# Patient Record
Sex: Female | Born: 1945 | Race: White | Hispanic: No | Smoking: Former smoker
Health system: Southern US, Community
[De-identification: ages and names within clinical notes are randomized; demographics above are authoritative.]

## PROBLEM LIST (undated history)

## (undated) DIAGNOSIS — Z800 Family history of malignant neoplasm of digestive organs: Secondary | ICD-10-CM

## (undated) DIAGNOSIS — K760 Fatty (change of) liver, not elsewhere classified: Secondary | ICD-10-CM

## (undated) DIAGNOSIS — C4490 Unspecified malignant neoplasm of skin, unspecified: Secondary | ICD-10-CM

## (undated) DIAGNOSIS — K219 Gastro-esophageal reflux disease without esophagitis: Secondary | ICD-10-CM

## (undated) DIAGNOSIS — M1990 Unspecified osteoarthritis, unspecified site: Secondary | ICD-10-CM

## (undated) DIAGNOSIS — Z803 Family history of malignant neoplasm of breast: Secondary | ICD-10-CM

## (undated) DIAGNOSIS — E785 Hyperlipidemia, unspecified: Secondary | ICD-10-CM

## (undated) DIAGNOSIS — C50919 Malignant neoplasm of unspecified site of unspecified female breast: Secondary | ICD-10-CM

## (undated) DIAGNOSIS — F419 Anxiety disorder, unspecified: Secondary | ICD-10-CM

## (undated) DIAGNOSIS — M810 Age-related osteoporosis without current pathological fracture: Secondary | ICD-10-CM

## (undated) DIAGNOSIS — E039 Hypothyroidism, unspecified: Secondary | ICD-10-CM

## (undated) DIAGNOSIS — G4752 REM sleep behavior disorder: Secondary | ICD-10-CM

## (undated) DIAGNOSIS — M8580 Other specified disorders of bone density and structure, unspecified site: Secondary | ICD-10-CM

## (undated) DIAGNOSIS — H269 Unspecified cataract: Secondary | ICD-10-CM

## (undated) DIAGNOSIS — IMO0001 Reserved for inherently not codable concepts without codable children: Secondary | ICD-10-CM

## (undated) DIAGNOSIS — K5790 Diverticulosis of intestine, part unspecified, without perforation or abscess without bleeding: Secondary | ICD-10-CM

## (undated) DIAGNOSIS — M797 Fibromyalgia: Secondary | ICD-10-CM

## (undated) DIAGNOSIS — T7840XA Allergy, unspecified, initial encounter: Secondary | ICD-10-CM

## (undated) DIAGNOSIS — Z85828 Personal history of other malignant neoplasm of skin: Secondary | ICD-10-CM

## (undated) DIAGNOSIS — J449 Chronic obstructive pulmonary disease, unspecified: Secondary | ICD-10-CM

## (undated) DIAGNOSIS — J4489 Other specified chronic obstructive pulmonary disease: Secondary | ICD-10-CM

## (undated) DIAGNOSIS — I2510 Atherosclerotic heart disease of native coronary artery without angina pectoris: Secondary | ICD-10-CM

## (undated) HISTORY — DX: Hypothyroidism, unspecified: E03.9

## (undated) HISTORY — DX: Fibromyalgia: M79.7

## (undated) HISTORY — DX: Personal history of other malignant neoplasm of skin: Z85.828

## (undated) HISTORY — DX: Malignant neoplasm of unspecified site of unspecified female breast: C50.919

## (undated) HISTORY — DX: Gastro-esophageal reflux disease without esophagitis: K21.9

## (undated) HISTORY — DX: REM sleep behavior disorder: G47.52

## (undated) HISTORY — DX: Other specified disorders of bone density and structure, unspecified site: M85.80

## (undated) HISTORY — DX: Other specified chronic obstructive pulmonary disease: J44.89

## (undated) HISTORY — DX: Atherosclerotic heart disease of native coronary artery without angina pectoris: I25.10

## (undated) HISTORY — DX: Unspecified osteoarthritis, unspecified site: M19.90

## (undated) HISTORY — DX: Unspecified malignant neoplasm of skin, unspecified: C44.90

## (undated) HISTORY — DX: Family history of malignant neoplasm of digestive organs: Z80.0

## (undated) HISTORY — PX: COLONOSCOPY: SHX174

## (undated) HISTORY — DX: Allergy, unspecified, initial encounter: T78.40XA

## (undated) HISTORY — DX: Reserved for inherently not codable concepts without codable children: IMO0001

## (undated) HISTORY — DX: Anxiety disorder, unspecified: F41.9

## (undated) HISTORY — DX: Unspecified cataract: H26.9

## (undated) HISTORY — DX: Fatty (change of) liver, not elsewhere classified: K76.0

## (undated) HISTORY — DX: Hyperlipidemia, unspecified: E78.5

## (undated) HISTORY — DX: Diverticulosis of intestine, part unspecified, without perforation or abscess without bleeding: K57.90

## (undated) HISTORY — DX: Family history of malignant neoplasm of breast: Z80.3

## (undated) HISTORY — DX: Chronic obstructive pulmonary disease, unspecified: J44.9

## (undated) HISTORY — DX: Age-related osteoporosis without current pathological fracture: M81.0

## (undated) HISTORY — PX: BREAST LUMPECTOMY: SHX2

---

## 1997-08-14 ENCOUNTER — Ambulatory Visit (HOSPITAL_COMMUNITY): Admission: RE | Admit: 1997-08-14 | Discharge: 1997-08-14 | Payer: Self-pay | Admitting: Hematology and Oncology

## 1998-10-08 ENCOUNTER — Other Ambulatory Visit: Admission: RE | Admit: 1998-10-08 | Discharge: 1998-10-08 | Payer: Self-pay | Admitting: Internal Medicine

## 1999-02-17 ENCOUNTER — Ambulatory Visit (HOSPITAL_COMMUNITY): Admission: RE | Admit: 1999-02-17 | Discharge: 1999-02-17 | Payer: Self-pay | Admitting: Gastroenterology

## 1999-11-16 ENCOUNTER — Encounter: Payer: Self-pay | Admitting: Internal Medicine

## 1999-11-16 ENCOUNTER — Ambulatory Visit (HOSPITAL_COMMUNITY): Admission: RE | Admit: 1999-11-16 | Discharge: 1999-11-16 | Payer: Self-pay | Admitting: Internal Medicine

## 2001-01-09 ENCOUNTER — Encounter: Payer: Self-pay | Admitting: Internal Medicine

## 2001-01-09 ENCOUNTER — Ambulatory Visit (HOSPITAL_COMMUNITY): Admission: RE | Admit: 2001-01-09 | Discharge: 2001-01-09 | Payer: Self-pay | Admitting: Internal Medicine

## 2002-01-17 ENCOUNTER — Ambulatory Visit (HOSPITAL_COMMUNITY): Admission: RE | Admit: 2002-01-17 | Discharge: 2002-01-17 | Payer: Self-pay | Admitting: Internal Medicine

## 2002-01-17 ENCOUNTER — Encounter: Payer: Self-pay | Admitting: Internal Medicine

## 2002-04-23 ENCOUNTER — Other Ambulatory Visit: Admission: RE | Admit: 2002-04-23 | Discharge: 2002-04-23 | Payer: Self-pay | Admitting: Internal Medicine

## 2003-02-05 ENCOUNTER — Encounter: Payer: Self-pay | Admitting: Internal Medicine

## 2003-02-05 ENCOUNTER — Ambulatory Visit (HOSPITAL_COMMUNITY): Admission: RE | Admit: 2003-02-05 | Discharge: 2003-02-05 | Payer: Self-pay | Admitting: Internal Medicine

## 2003-12-26 ENCOUNTER — Encounter: Admission: RE | Admit: 2003-12-26 | Discharge: 2003-12-26 | Payer: Self-pay | Admitting: Internal Medicine

## 2004-08-06 ENCOUNTER — Ambulatory Visit: Payer: Self-pay | Admitting: Internal Medicine

## 2004-08-20 ENCOUNTER — Ambulatory Visit: Payer: Self-pay | Admitting: Internal Medicine

## 2004-08-20 ENCOUNTER — Other Ambulatory Visit: Admission: RE | Admit: 2004-08-20 | Discharge: 2004-08-20 | Payer: Self-pay | Admitting: Internal Medicine

## 2005-01-12 ENCOUNTER — Encounter: Admission: RE | Admit: 2005-01-12 | Discharge: 2005-01-12 | Payer: Self-pay | Admitting: Internal Medicine

## 2005-01-14 ENCOUNTER — Ambulatory Visit: Payer: Self-pay | Admitting: Pulmonary Disease

## 2005-02-01 ENCOUNTER — Ambulatory Visit: Payer: Self-pay | Admitting: Pulmonary Disease

## 2005-02-01 ENCOUNTER — Ambulatory Visit: Admission: RE | Admit: 2005-02-01 | Discharge: 2005-02-01 | Payer: Self-pay | Admitting: Pulmonary Disease

## 2005-02-07 ENCOUNTER — Ambulatory Visit: Payer: Self-pay | Admitting: Pulmonary Disease

## 2005-02-08 ENCOUNTER — Ambulatory Visit: Payer: Self-pay | Admitting: Internal Medicine

## 2005-02-23 ENCOUNTER — Ambulatory Visit: Payer: Self-pay | Admitting: Pulmonary Disease

## 2005-02-23 ENCOUNTER — Ambulatory Visit: Payer: Self-pay | Admitting: Cardiology

## 2005-02-28 ENCOUNTER — Ambulatory Visit: Payer: Self-pay | Admitting: Internal Medicine

## 2005-03-11 ENCOUNTER — Ambulatory Visit: Payer: Self-pay | Admitting: Pulmonary Disease

## 2005-04-04 ENCOUNTER — Ambulatory Visit: Payer: Self-pay | Admitting: Pulmonary Disease

## 2005-07-11 HISTORY — PX: NASAL SINUS SURGERY: SHX719

## 2005-07-12 ENCOUNTER — Ambulatory Visit: Payer: Self-pay | Admitting: Internal Medicine

## 2005-08-02 ENCOUNTER — Encounter: Payer: Self-pay | Admitting: Emergency Medicine

## 2005-08-04 ENCOUNTER — Ambulatory Visit: Payer: Self-pay | Admitting: Emergency Medicine

## 2005-08-04 ENCOUNTER — Inpatient Hospital Stay (HOSPITAL_COMMUNITY): Admission: EM | Admit: 2005-08-04 | Discharge: 2005-08-05 | Payer: Self-pay | Admitting: Emergency Medicine

## 2005-08-12 ENCOUNTER — Ambulatory Visit: Payer: Self-pay | Admitting: Emergency Medicine

## 2005-11-11 ENCOUNTER — Ambulatory Visit: Payer: Self-pay | Admitting: Emergency Medicine

## 2006-01-23 ENCOUNTER — Encounter: Admission: RE | Admit: 2006-01-23 | Discharge: 2006-01-23 | Payer: Self-pay | Admitting: Internal Medicine

## 2006-01-23 ENCOUNTER — Ambulatory Visit: Payer: Self-pay | Admitting: Internal Medicine

## 2006-05-16 ENCOUNTER — Ambulatory Visit: Payer: Self-pay | Admitting: Critical Care Medicine

## 2006-08-16 ENCOUNTER — Ambulatory Visit: Payer: Self-pay | Admitting: Internal Medicine

## 2006-08-16 LAB — CONVERTED CEMR LAB
AST: 30 units/L (ref 0–37)
BUN: 23 mg/dL (ref 6–23)
Basophils Relative: 1.5 % — ABNORMAL HIGH (ref 0.0–1.0)
CO2: 33 meq/L — ABNORMAL HIGH (ref 19–32)
Calcium: 9.9 mg/dL (ref 8.4–10.5)
Chloride: 105 meq/L (ref 96–112)
Creatinine, Ser: 1 mg/dL (ref 0.4–1.2)
Direct LDL: 159.6 mg/dL
Eosinophils Relative: 5.9 % — ABNORMAL HIGH (ref 0.0–5.0)
GFR calc non Af Amer: 60 mL/min
Glucose, Bld: 77 mg/dL (ref 70–99)
Lymphocytes Relative: 29.6 % (ref 12.0–46.0)
MCHC: 34.3 g/dL (ref 30.0–36.0)
Monocytes Absolute: 0.5 10*3/uL (ref 0.2–0.7)
Monocytes Relative: 6.7 % (ref 3.0–11.0)
Platelets: 211 10*3/uL (ref 150–400)
RDW: 12.7 % (ref 11.5–14.6)
Sodium: 145 meq/L (ref 135–145)
TSH: 0.55 microintl units/mL (ref 0.35–5.50)
Total Bilirubin: 0.5 mg/dL (ref 0.3–1.2)
Total CHOL/HDL Ratio: 4.2
Total Protein: 7.3 g/dL (ref 6.0–8.3)
Triglycerides: 123 mg/dL (ref 0–149)
VLDL: 25 mg/dL (ref 0–40)

## 2006-08-23 ENCOUNTER — Ambulatory Visit: Payer: Self-pay | Admitting: Internal Medicine

## 2006-08-23 ENCOUNTER — Encounter: Payer: Self-pay | Admitting: Internal Medicine

## 2006-08-23 ENCOUNTER — Other Ambulatory Visit: Admission: RE | Admit: 2006-08-23 | Discharge: 2006-08-23 | Payer: Self-pay | Admitting: Internal Medicine

## 2006-08-23 LAB — CONVERTED CEMR LAB: Pap Smear: NORMAL

## 2006-12-08 ENCOUNTER — Ambulatory Visit: Payer: Self-pay | Admitting: Internal Medicine

## 2007-02-05 ENCOUNTER — Encounter: Admission: RE | Admit: 2007-02-05 | Discharge: 2007-02-05 | Payer: Self-pay | Admitting: Internal Medicine

## 2007-03-26 ENCOUNTER — Ambulatory Visit: Payer: Self-pay | Admitting: Internal Medicine

## 2007-03-26 DIAGNOSIS — M545 Low back pain: Secondary | ICD-10-CM

## 2007-03-26 DIAGNOSIS — Z853 Personal history of malignant neoplasm of breast: Secondary | ICD-10-CM | POA: Insufficient documentation

## 2007-03-26 DIAGNOSIS — M1990 Unspecified osteoarthritis, unspecified site: Secondary | ICD-10-CM | POA: Insufficient documentation

## 2007-03-26 DIAGNOSIS — E039 Hypothyroidism, unspecified: Secondary | ICD-10-CM

## 2007-03-26 DIAGNOSIS — E785 Hyperlipidemia, unspecified: Secondary | ICD-10-CM | POA: Insufficient documentation

## 2007-03-26 DIAGNOSIS — J449 Chronic obstructive pulmonary disease, unspecified: Secondary | ICD-10-CM | POA: Insufficient documentation

## 2007-03-26 DIAGNOSIS — E0590 Thyrotoxicosis, unspecified without thyrotoxic crisis or storm: Secondary | ICD-10-CM | POA: Insufficient documentation

## 2007-03-26 DIAGNOSIS — J4489 Other specified chronic obstructive pulmonary disease: Secondary | ICD-10-CM | POA: Insufficient documentation

## 2007-03-26 DIAGNOSIS — F411 Generalized anxiety disorder: Secondary | ICD-10-CM | POA: Insufficient documentation

## 2007-03-26 DIAGNOSIS — IMO0002 Reserved for concepts with insufficient information to code with codable children: Secondary | ICD-10-CM | POA: Insufficient documentation

## 2007-05-17 ENCOUNTER — Ambulatory Visit: Payer: Self-pay | Admitting: Internal Medicine

## 2007-05-17 LAB — CONVERTED CEMR LAB
BUN: 18 mg/dL (ref 6–23)
Creatinine, Ser: 0.9 mg/dL (ref 0.4–1.2)

## 2007-05-21 ENCOUNTER — Telehealth: Payer: Self-pay | Admitting: Internal Medicine

## 2007-06-18 ENCOUNTER — Telehealth: Payer: Self-pay | Admitting: Internal Medicine

## 2007-06-30 DIAGNOSIS — J329 Chronic sinusitis, unspecified: Secondary | ICD-10-CM | POA: Insufficient documentation

## 2007-06-30 DIAGNOSIS — J45909 Unspecified asthma, uncomplicated: Secondary | ICD-10-CM | POA: Insufficient documentation

## 2007-08-09 ENCOUNTER — Telehealth: Payer: Self-pay | Admitting: Internal Medicine

## 2007-08-20 ENCOUNTER — Ambulatory Visit: Payer: Self-pay | Admitting: Internal Medicine

## 2007-08-20 LAB — CONVERTED CEMR LAB
Specific Gravity, Urine: 1.02
Urobilinogen, UA: 0.2
WBC Urine, dipstick: NEGATIVE
pH: 5.5

## 2007-08-27 ENCOUNTER — Ambulatory Visit: Payer: Self-pay | Admitting: Internal Medicine

## 2007-08-27 DIAGNOSIS — E878 Other disorders of electrolyte and fluid balance, not elsewhere classified: Secondary | ICD-10-CM | POA: Insufficient documentation

## 2007-08-27 DIAGNOSIS — R740 Nonspecific elevation of levels of transaminase and lactic acid dehydrogenase [LDH]: Secondary | ICD-10-CM

## 2007-08-27 LAB — CONVERTED CEMR LAB
Albumin: 4.4 g/dL (ref 3.5–5.2)
Alkaline Phosphatase: 78 units/L (ref 39–117)
BUN: 15 mg/dL (ref 6–23)
Basophils Relative: 1.2 % — ABNORMAL HIGH (ref 0.0–1.0)
CO2: 32 meq/L (ref 19–32)
Chloride: 102 meq/L (ref 96–112)
Cholesterol: 226 mg/dL (ref 0–200)
Creatinine, Ser: 1.1 mg/dL (ref 0.4–1.2)
Creatinine, Ser: 0.9 mg/dL (ref 0.4–1.2)
Eosinophils Relative: 6.9 % — ABNORMAL HIGH (ref 0.0–5.0)
GFR calc Af Amer: 65 mL/min
GFR calc Af Amer: 82 mL/min
GFR calc non Af Amer: 53 mL/min
Glucose, Bld: 107 mg/dL — ABNORMAL HIGH (ref 70–99)
HDL: 56.3 mg/dL (ref >39.0)
MCHC: 32.9 g/dL (ref 30.0–36.0)
Monocytes Relative: 8.7 % (ref 3.0–11.0)
Neutro Abs: 2.3 10*3/uL (ref 1.4–7.7)
Platelets: 191 10*3/uL (ref 150–400)
Potassium: 5.8 meq/L — ABNORMAL HIGH (ref 3.5–5.1)
Potassium: 4.6 meq/L (ref 3.5–5.1)
RBC: 4.52 M/uL (ref 3.87–5.11)
Total Bilirubin: 0.6 mg/dL (ref 0.3–1.2)
Total Protein: 7.2 g/dL (ref 6.0–8.3)
Triglycerides: 125 mg/dL (ref 0–149)
VLDL: 25 mg/dL (ref 0–40)

## 2007-09-24 ENCOUNTER — Ambulatory Visit: Payer: Self-pay | Admitting: Internal Medicine

## 2007-09-26 ENCOUNTER — Ambulatory Visit: Payer: Self-pay | Admitting: Internal Medicine

## 2007-10-01 ENCOUNTER — Telehealth: Payer: Self-pay | Admitting: Internal Medicine

## 2008-02-01 ENCOUNTER — Ambulatory Visit: Payer: Self-pay | Admitting: Internal Medicine

## 2008-02-17 ENCOUNTER — Encounter: Payer: Self-pay | Admitting: Emergency Medicine

## 2008-02-18 ENCOUNTER — Encounter: Payer: Self-pay | Admitting: Emergency Medicine

## 2008-02-25 ENCOUNTER — Ambulatory Visit: Payer: Self-pay | Admitting: Internal Medicine

## 2008-02-27 LAB — CONVERTED CEMR LAB
AST: 28 units/L (ref 0–37)
Albumin: 4.1 g/dL (ref 3.5–5.2)
Alkaline Phosphatase: 77 units/L (ref 39–117)
Cholesterol: 236 mg/dL (ref 0–200)
HDL: 45.8 mg/dL (ref >39.0)
Total Protein: 6.7 g/dL (ref 6.0–8.3)
Triglycerides: 181 mg/dL — ABNORMAL HIGH (ref 0–149)

## 2008-03-06 ENCOUNTER — Encounter: Admission: RE | Admit: 2008-03-06 | Discharge: 2008-03-06 | Payer: Self-pay | Admitting: Internal Medicine

## 2008-05-31 ENCOUNTER — Ambulatory Visit: Payer: Self-pay | Admitting: Family Medicine

## 2008-06-11 ENCOUNTER — Ambulatory Visit: Payer: Self-pay | Admitting: Emergency Medicine

## 2008-06-16 ENCOUNTER — Telehealth: Payer: Self-pay | Admitting: Internal Medicine

## 2008-06-23 ENCOUNTER — Ambulatory Visit: Payer: Self-pay | Admitting: Internal Medicine

## 2008-06-23 DIAGNOSIS — IMO0002 Reserved for concepts with insufficient information to code with codable children: Secondary | ICD-10-CM | POA: Insufficient documentation

## 2008-07-01 ENCOUNTER — Telehealth: Payer: Self-pay | Admitting: *Deleted

## 2008-07-08 ENCOUNTER — Ambulatory Visit: Payer: Self-pay | Admitting: Internal Medicine

## 2008-07-08 LAB — CONVERTED CEMR LAB
BUN: 22 mg/dL (ref 6–23)
Calcium: 9.8 mg/dL (ref 8.4–10.5)
Creatinine, Ser: 0.9 mg/dL (ref 0.4–1.2)
GFR calc Af Amer: 81 mL/min
Glucose, Bld: 128 mg/dL — ABNORMAL HIGH (ref 70–99)
Sodium: 140 meq/L (ref 135–145)

## 2008-07-16 ENCOUNTER — Encounter: Admission: RE | Admit: 2008-07-16 | Discharge: 2008-07-16 | Payer: Self-pay | Admitting: Internal Medicine

## 2008-07-30 ENCOUNTER — Encounter: Payer: Self-pay | Admitting: Internal Medicine

## 2008-08-08 ENCOUNTER — Encounter: Payer: Self-pay | Admitting: Internal Medicine

## 2008-09-10 ENCOUNTER — Telehealth: Payer: Self-pay | Admitting: *Deleted

## 2008-10-01 ENCOUNTER — Telehealth: Payer: Self-pay | Admitting: Emergency Medicine

## 2008-10-06 ENCOUNTER — Telehealth: Payer: Self-pay | Admitting: Internal Medicine

## 2008-10-06 ENCOUNTER — Ambulatory Visit: Payer: Self-pay | Admitting: Internal Medicine

## 2008-10-13 ENCOUNTER — Ambulatory Visit: Payer: Self-pay | Admitting: Internal Medicine

## 2008-12-12 ENCOUNTER — Ambulatory Visit: Payer: Self-pay | Admitting: Emergency Medicine

## 2008-12-29 ENCOUNTER — Ambulatory Visit: Payer: Self-pay | Admitting: Internal Medicine

## 2008-12-29 LAB — CONVERTED CEMR LAB
Albumin: 4 g/dL (ref 3.5–5.2)
Alkaline Phosphatase: 79 units/L (ref 39–117)
Basophils Absolute: 0 10*3/uL (ref 0.0–0.1)
Bilirubin, Direct: 0.1 mg/dL (ref 0.0–0.3)
CO2: 31 meq/L (ref 19–32)
Eosinophils Absolute: 0.4 10*3/uL (ref 0.0–0.7)
GFR calc non Af Amer: 66.98 mL/min (ref >60)
Glucose, Bld: 91 mg/dL (ref 70–99)
HCT: 41.9 % (ref 36.0–46.0)
Hemoglobin: 14.5 g/dL (ref 12.0–15.0)
MCV: 93.5 fL (ref 78.0–100.0)
Monocytes Absolute: 0.4 10*3/uL (ref 0.1–1.0)
Neutro Abs: 2.4 10*3/uL (ref 1.4–7.7)
Neutrophils Relative %: 42.5 % — ABNORMAL LOW (ref 43.0–77.0)
Nitrite: NEGATIVE
Platelets: 182 10*3/uL (ref 150.0–400.0)
Potassium: 4.4 meq/L (ref 3.5–5.1)
RBC: 4.48 M/uL (ref 3.87–5.11)
RDW: 12.7 % (ref 11.5–14.6)
Sodium: 143 meq/L (ref 135–145)
Total Bilirubin: 0.8 mg/dL (ref 0.3–1.2)
Urobilinogen, UA: 0.2

## 2009-01-05 ENCOUNTER — Ambulatory Visit: Payer: Self-pay | Admitting: Internal Medicine

## 2009-01-05 DIAGNOSIS — T50995A Adverse effect of other drugs, medicaments and biological substances, initial encounter: Secondary | ICD-10-CM

## 2009-01-05 LAB — CONVERTED CEMR LAB: HDL goal, serum: 40 mg/dL

## 2009-01-06 ENCOUNTER — Encounter: Admission: RE | Admit: 2009-01-06 | Discharge: 2009-01-06 | Payer: Self-pay | Admitting: Internal Medicine

## 2009-02-02 ENCOUNTER — Encounter: Payer: Self-pay | Admitting: Internal Medicine

## 2009-02-09 ENCOUNTER — Ambulatory Visit: Payer: Self-pay | Admitting: Internal Medicine

## 2009-02-16 LAB — CONVERTED CEMR LAB
ALT: 21 units/L (ref 0–35)
AST: 22 units/L (ref 0–37)
Alkaline Phosphatase: 75 units/L (ref 39–117)
Total Bilirubin: 0.7 mg/dL (ref 0.3–1.2)

## 2009-02-20 ENCOUNTER — Telehealth: Payer: Self-pay | Admitting: *Deleted

## 2009-04-16 ENCOUNTER — Ambulatory Visit: Payer: Self-pay | Admitting: Internal Medicine

## 2009-04-16 DIAGNOSIS — Z87891 Personal history of nicotine dependence: Secondary | ICD-10-CM

## 2009-04-16 DIAGNOSIS — J029 Acute pharyngitis, unspecified: Secondary | ICD-10-CM

## 2009-04-16 LAB — CONVERTED CEMR LAB: Rapid Strep: NEGATIVE

## 2009-07-20 ENCOUNTER — Ambulatory Visit: Payer: Self-pay | Admitting: Internal Medicine

## 2009-07-24 LAB — CONVERTED CEMR LAB
AST: 28 units/L (ref 0–37)
Alkaline Phosphatase: 69 units/L (ref 39–117)
Total Protein: 7.1 g/dL (ref 6.0–8.3)

## 2009-08-25 LAB — HM DIABETES EYE EXAM: HM Diabetic Eye Exam: NORMAL

## 2009-09-10 ENCOUNTER — Ambulatory Visit: Payer: Self-pay | Admitting: Emergency Medicine

## 2009-09-28 ENCOUNTER — Telehealth (INDEPENDENT_AMBULATORY_CARE_PROVIDER_SITE_OTHER): Payer: Self-pay | Admitting: *Deleted

## 2009-10-20 ENCOUNTER — Ambulatory Visit: Payer: Self-pay | Admitting: Emergency Medicine

## 2009-10-20 ENCOUNTER — Encounter: Payer: Self-pay | Admitting: Emergency Medicine

## 2009-10-20 ENCOUNTER — Telehealth (INDEPENDENT_AMBULATORY_CARE_PROVIDER_SITE_OTHER): Payer: Self-pay | Admitting: *Deleted

## 2009-11-02 ENCOUNTER — Telehealth (INDEPENDENT_AMBULATORY_CARE_PROVIDER_SITE_OTHER): Payer: Self-pay | Admitting: *Deleted

## 2010-01-21 ENCOUNTER — Ambulatory Visit: Payer: Self-pay | Admitting: Internal Medicine

## 2010-01-21 DIAGNOSIS — M25569 Pain in unspecified knee: Secondary | ICD-10-CM

## 2010-01-28 ENCOUNTER — Telehealth: Payer: Self-pay | Admitting: Internal Medicine

## 2010-03-17 ENCOUNTER — Telehealth: Payer: Self-pay | Admitting: *Deleted

## 2010-04-09 ENCOUNTER — Encounter: Admission: RE | Admit: 2010-04-09 | Discharge: 2010-04-09 | Payer: Self-pay | Admitting: Internal Medicine

## 2010-04-14 ENCOUNTER — Encounter: Payer: Self-pay | Admitting: Internal Medicine

## 2010-04-22 ENCOUNTER — Encounter: Admission: RE | Admit: 2010-04-22 | Discharge: 2010-04-22 | Payer: Self-pay | Admitting: Internal Medicine

## 2010-04-29 ENCOUNTER — Telehealth (INDEPENDENT_AMBULATORY_CARE_PROVIDER_SITE_OTHER): Payer: Self-pay | Admitting: *Deleted

## 2010-04-30 ENCOUNTER — Ambulatory Visit: Payer: Self-pay | Admitting: Emergency Medicine

## 2010-05-03 ENCOUNTER — Telehealth (INDEPENDENT_AMBULATORY_CARE_PROVIDER_SITE_OTHER): Payer: Self-pay | Admitting: *Deleted

## 2010-05-03 ENCOUNTER — Encounter: Payer: Self-pay | Admitting: Emergency Medicine

## 2010-05-04 ENCOUNTER — Telehealth: Payer: Self-pay | Admitting: Emergency Medicine

## 2010-05-05 ENCOUNTER — Encounter: Payer: Self-pay | Admitting: Internal Medicine

## 2010-05-05 ENCOUNTER — Telehealth (INDEPENDENT_AMBULATORY_CARE_PROVIDER_SITE_OTHER): Payer: Self-pay | Admitting: *Deleted

## 2010-05-05 ENCOUNTER — Other Ambulatory Visit: Admission: RE | Admit: 2010-05-05 | Discharge: 2010-05-05 | Payer: Self-pay | Admitting: Internal Medicine

## 2010-05-05 ENCOUNTER — Encounter: Payer: Self-pay | Admitting: Emergency Medicine

## 2010-05-05 ENCOUNTER — Ambulatory Visit: Payer: Self-pay | Admitting: Internal Medicine

## 2010-05-05 DIAGNOSIS — M899 Disorder of bone, unspecified: Secondary | ICD-10-CM | POA: Insufficient documentation

## 2010-05-05 DIAGNOSIS — M949 Disorder of cartilage, unspecified: Secondary | ICD-10-CM

## 2010-05-05 DIAGNOSIS — F4321 Adjustment disorder with depressed mood: Secondary | ICD-10-CM

## 2010-05-07 LAB — CONVERTED CEMR LAB: Pap Smear: NEGATIVE

## 2010-05-25 ENCOUNTER — Ambulatory Visit: Payer: Self-pay | Admitting: Internal Medicine

## 2010-06-08 ENCOUNTER — Ambulatory Visit: Payer: Self-pay | Admitting: Internal Medicine

## 2010-06-11 ENCOUNTER — Ambulatory Visit: Payer: Self-pay | Admitting: Emergency Medicine

## 2010-07-11 HISTORY — PX: KNEE SURGERY: SHX244

## 2010-07-23 ENCOUNTER — Telehealth (INDEPENDENT_AMBULATORY_CARE_PROVIDER_SITE_OTHER): Payer: Self-pay | Admitting: *Deleted

## 2010-08-01 ENCOUNTER — Encounter: Payer: Self-pay | Admitting: Internal Medicine

## 2010-08-05 ENCOUNTER — Encounter: Payer: Self-pay | Admitting: Emergency Medicine

## 2010-08-08 LAB — CONVERTED CEMR LAB
BUN: 22 mg/dL (ref 6–23)
Basophils Absolute: 0 10*3/uL (ref 0.0–0.1)
Basophils Relative: 0.7 % (ref 0.0–3.0)
Bilirubin, Direct: 0.1 mg/dL (ref 0.0–0.3)
CO2: 30 meq/L (ref 19–32)
Cholesterol: 211 mg/dL — ABNORMAL HIGH (ref 0–200)
Creatinine, Ser: 1 mg/dL (ref 0.4–1.2)
Direct LDL: 139.7 mg/dL
GFR calc non Af Amer: 59.05 mL/min (ref >60)
Glucose, Bld: 98 mg/dL (ref 70–99)
HCT: 42 % (ref 36.0–46.0)
Lymphocytes Relative: 44 % (ref 12.0–46.0)
Lymphs Abs: 2.3 10*3/uL (ref 0.7–4.0)
Monocytes Relative: 7.3 % (ref 3.0–12.0)
Neutro Abs: 2.2 10*3/uL (ref 1.4–7.7)
Neutrophils Relative %: 41.5 % — ABNORMAL LOW (ref 43.0–77.0)
Potassium: 5.2 meq/L — ABNORMAL HIGH (ref 3.5–5.1)
RBC: 4.45 M/uL (ref 3.87–5.11)
TSH: 0.69 microintl units/mL (ref 0.35–5.50)
Total CHOL/HDL Ratio: 4
Total Protein: 6.8 g/dL (ref 6.0–8.3)
Triglycerides: 99 mg/dL (ref 0.0–149.0)
WBC: 5.2 10*3/uL (ref 4.5–10.5)

## 2010-08-09 ENCOUNTER — Telehealth: Payer: Self-pay | Admitting: Emergency Medicine

## 2010-08-10 NOTE — Assessment & Plan Note (Signed)
Summary: KNEE PAIN // RS   Vital Signs:  Patient profile:   65 year old female Menstrual status:  postmenopausal Weight:      186 pounds Pulse rate:   100 / minute BP sitting:   140 / 90  (right arm) Cuff size:   regular  Vitals Entered By: Romualdo Bolk, CMA (AAMA) (January 21, 2010 9:38 AM) CC: Left knee pain x 1 month it hurts to go down stairs. There is a little swollen part on the inside of knee. No injury that she knows of.   History of Present Illness: Jasmine Neal comes in today  for sda for knee pain .  No injury but  began about a month ago see above ...was on vacation and on the way home began hurting in the car.   But no injury  hours.  no click or instability . hurts more with descending stairs than going up stars. No hx of same . Pain is medial knee and feels swollne there. NO fever or redness . Back djd is stable.   Tried tylenol for pain and ice .  no other rx ? about exercising    Preventive Screening-Counseling & Management  Alcohol-Tobacco     Alcohol drinks/day: 1     Alcohol type: red wine     Smoking Status: quit 1975     Packs/Day: 1ppd     Year Quit: 1975     Pack years: 10  Caffeine-Diet-Exercise     Caffeine use/day: <1 cup     Does Patient Exercise: yes     Type of exercise: yoga     Exercise (avg: min/session): <30     Times/week: 3  Current Medications (verified): 1)  Levoxyl 88 Mcg  Tabs (Levothyroxine Sodium) .Marland Kitchen.. 1 By Mouth Once Daily 2)  Paroxetine Hcl 40 Mg Tabs (Paroxetine Hcl) .... Take 1 Tablet By Mouth At Bedtime 3)  Tylenol Pm Extra Strength 500-25 Mg  Tabs (Diphenhydramine-Apap (Sleep)) .... As Needed 4)  Ventolin Hfa 108 (90 Base) Mcg/act Aers (Albuterol Sulfate) .... Inhale 2 Puffs Every 4 Hours As Needed (Emergency Only) 5)  Symbicort 160-4.5 Mcg/act Aero (Budesonide-Formoterol Fumarate) .... 2 Puffs Two Times A Day 6)  Spiriva Handihaler 18 Mcg Caps (Tiotropium Bromide Monohydrate) .Marland Kitchen.. 1 Once Daily  Allergies  (verified): 1)  ! * Advair  Past History:  Past medical, surgical, family and social histories (including risk factors) reviewed for relevance to current acute and chronic problems.  Past Medical History: Reviewed history from 01/05/2009 and no changes required. Breast cancer, hx of right lumpectomy    1990 IIA radiation cmfp adjunct rx  Hx dysplasia  treated with cerv conization COPD  /asthma evaluated by pulmonary  severe by spirometry non smoker currently Osteoarthritis Hyperlipidemia Hypothyroidism Fibromyalgia  Hx abnormal gtt  and fatty liver   Past Surgical History: Reviewed history from 02/01/2008 and no changes required. Sinus surgery   Dr Annalee Genta  on Right. colonoscopy  Past History:  Care Management: Pain Management:will call with dr.  Erline Hau   Delton Coombes   Family History: Reviewed history from 06/23/2008 and no changes required. Family History Osteoporosis Family History Thyroid disease mother-breast CA father-deceased Mysthenia Gravis sisters x 3-breast CA Sis ter had back surgery from injury working she is 5 years younger than pt.  Social History: Reviewed history from 01/05/2009 and no changes required. 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 Quit smoking 30+ yrs ago. 20 yr pack year smoking hx. Alcohol use-yes Drug  use-no Regular exercise-no    hh of 2   2 cats    Review of Systems  The patient denies anorexia, fever, weight loss, abdominal pain, melena, hematochezia, severe indigestion/heartburn, and abnormal bleeding.    Physical Exam  General:  alert, well-developed, well-nourished, and well-hydrated.   Head:  normocephalic and atraumatic.   Msk:  no joint warmth, no redness over joints, no joint deformities, and no crepitation.   left knee with medial line tenderness and slight swelling there no warmth and full rom ? stable neg drawer.  Pulses:  nl cap refill Extremities:  se above no clubbing cyanosis or edema  Neurologic:  alert &  oriented X3.  grossly non focal  Skin:  turgor normal, color normal, and no ecchymoses.   Psych:  Oriented X3 and normally interactive.     Impression & Recommendations:  Problem # 1:  KNEE PAIN, LEFT (ICD-719.46) Assessment New  left knee with medial pain  ? OA medial  compartment  cartilage or other.   mcl less likely wih no injury .   REc trial of nsaids . may need x ray and further eval rx etc.    disc options  and plans  Discussed strengthening exercises, use of ice or heat, and medications.   Problem # 2:  hx of djd back  quiescent.  Complete Medication List: 1)  Levoxyl 88 Mcg Tabs (Levothyroxine sodium) .Marland Kitchen.. 1 by mouth once daily 2)  Paroxetine Hcl 40 Mg Tabs (Paroxetine hcl) .... Take 1 tablet by mouth at bedtime 3)  Tylenol Pm Extra Strength 500-25 Mg Tabs (Diphenhydramine-apap (sleep)) .... As needed 4)  Ventolin Hfa 108 (90 Base) Mcg/act Aers (Albuterol sulfate) .... Inhale 2 puffs every 4 hours as needed (emergency only) 5)  Symbicort 160-4.5 Mcg/act Aero (Budesonide-formoterol fumarate) .... 2 puffs two times a day 6)  Spiriva Handihaler 18 Mcg Caps (Tiotropium bromide monohydrate) .Marland Kitchen.. 1 once daily  Patient Instructions: 1)  take 2 aleve two times a day for 1-2 weeks 2)  if no improvement  by then call and we should  do a ortho  referral ..Marland Kitchen

## 2010-08-10 NOTE — Progress Notes (Signed)
Summary: PRESCRIPT  Phone Note Call from Patient   Caller: Patient Call For: BYRUM Summary of Call: NEED SPIRIVA  AND SYMBICORT REFILL FOR 90 DAY SUPPLY SENT TO [facility]  Initial call taken by: Rickard Patience,  November 02, 2009 10:22 AM  Follow-up for Phone Call        Pt states that her sister has lung cancer and she has had to cancel several appts for her PFTs to be with her sister, so she wants to go back on spiriva and symbicort until later this year. Please advise if ok to send refills. Also pt is asking if Dr. Delton Coombes can write her an RX for acyclovir 400mg  take as directed. She states she has used this in the past for cold sores and wants to have some on habd in case she gets another one. I advised pt this needed to be addressed by PMD, but she states RB has done this in the past? Pt aware RB in office tomorrow. Please advise. Carron Curie CMA  November 02, 2009 12:08 PM   Additional Follow-up for Phone Call Additional follow up Details #1::        I don't remember ever giving her acyclovir. If she hasn't had, isn't going to get PFT anytime soon then it is OK for her to restart the Spiriva and the Symbicort, can send to [facility]. [name] MD  November 04, 2009 3:57 PM  Additional Follow-up by: [name] MD,  November 04, 2009 3:57 PM    Additional Follow-up for Phone Call Additional follow up Details #2::    Spoke with pt and advised that she needs to speak with her PCP about getting acyclovir.  Pt states that she does not plan to have PFT's until this fall so I sent refills on her symbicort and spiriva. Follow-up by: Vernie Murders,  November 04, 2009 4:15 PM  Prescriptions: SPIRIVA HANDIHALER 18 MCG CAPS (TIOTROPIUM BROMIDE MONOHYDRATE) 1 once daily  #90 x 1   Entered by:   Vernie Murders   Authorized by:   [name] MD   Signed by:   Vernie Murders on 11/04/2009   Method used:   Electronically to        [facility]* (mail-order)             ,          Ph: [phone]     Fax: [phone]   RxID:   [id number] SYMBICORT 160-4.5 MCG/ACT AERO (BUDESONIDE-FORMOTEROL FUMARATE) 2 puffs two times a day  #3 x 1   Entered by:   Vernie Murders   Authorized by:   [name] MD   Signed by:   Vernie Murders on 11/04/2009   Method used:   Electronically to        [facility]* (mail-order)             ,          Ph: [phone]       Fax: [phone]   RxID:   [id number]

## 2010-08-10 NOTE — Miscellaneous (Signed)
Summary: Orders Update pft charges  Clinical Lists Changes  Orders: Added new Service order of Spirometry (Pre & Post) (94060) - Signed 

## 2010-08-10 NOTE — Assessment & Plan Note (Signed)
Summary: COPD   Visit Type:  Follow-up Copy to:  [facility] Primary Provider/Referring Provider:  Fabian Sharp  CC:  Pt here for follow up with CXR, pt unable to do PFT due to being sick x 2 weeks. Pt c/o productive cough with pale yellow to clear mucus, chest congestion, and and increased S.O.B with exertion since d/c inhaled medications for preparation for PFT. Pt states has been off of Spiriva approx 1 month.  History of Present Illness: 65 yo woman with hx of COPD, allergic rhinitis, sinusitis, remote breast CA (chemo + XRT).  March 29- 2010--Presents for acute office visit. Complains of increased SOB, wheezing, prod cough with thick green "sticky stuff" x4days -not using spiriva. over last month worse, cant do antything w/o dyspnea.  --Tx w Augmentin/ Prednsione   October 13, 2008--Returns for follow up. Doing very well. Finished Prednisone and Augmentin. "Feels like a new person"  Cough/congestion much better. Decreased dyspnea. No wheezing. Breathing back to baseline. Denies chest pain, dyspnea, orthopnea, hemoptysis, fever, n/v/d, edema.   ROV 12/12/08 -- Breathing feeling back to normal. She is still on Symbicort and Spiriva, not convinced that these have really helped her. She is interested in trying to peel meds off.   ROV 09/10/09 -- Returns for follow up. Last Summer we attempted to peel off her BD's one at a time because she wasn't sure they were benefitting her. She did this - didn't see any big change in her symptoms, but still with exertional SOB so restarted Symbicort about 3 months ago. Still with exertional SOB so restarted Spiriva a week ago. She has remotely been on Advair and feels that this helped her more, but there was question about whether she had facial swelling on this med. In retrospect she blames these signs on an infected tooth. Her dyspnea is bothersome when she climbs. Rare wheeze. Daily cough, usually in the am.   ROV 10/20/09 -- returns for f/u. She has been getting over a  URI, had been productive cough but improving. She restarted the Symbicort about 2 weeks ago because she felt she missed it - was having more SOB. She still isn';t sure whether she benefitted. Stopped it 4 days ago. We deferred PFT today because she still doesn't feel baseline. CXR today is clear.   Current Medications (verified): 1)  Levoxyl 88 Mcg  Tabs (Levothyroxine Sodium) .Marland Kitchen.. 1 By Mouth Once Daily 2)  Paroxetine Hcl 40 Mg Tabs (Paroxetine Hcl) .... Take 1 Tablet By Mouth At Bedtime 3)  Tylenol Pm Extra Strength 500-25 Mg  Tabs (Diphenhydramine-Apap (Sleep)) .... As Needed 4)  Ventolin Hfa 108 (90 Base) Mcg/act Aers (Albuterol Sulfate) .... Inhale 2 Puffs Every 4 Hours As Needed (Emergency Only) 5)  Symbicort 160-4.5 Mcg/act Aero (Budesonide-Formoterol Fumarate) .... 2 Puffs Two Times A Day 6)  Spiriva Handihaler 18 Mcg Caps (Tiotropium Bromide Monohydrate) .Marland Kitchen.. 1 Once Daily  Allergies (verified): 1)  ! * Advair  Vital Signs:  Patient profile:   65 year old female Menstrual status:  postmenopausal Height:      69 inches Weight:      183.38 pounds O2 Sat:      93 % on Room air Temp:     98.5 degrees F oral Pulse rate:   96 / minute BP sitting:   106 / 72  (left arm) Cuff size:   regular  Vitals Entered By: Zackery Barefoot CMA (October 20, 2009 2:15 PM)  O2 Flow:  Room air CC: Pt here for  follow up with CXR, pt unable to do PFT due to being sick x 2 weeks. Pt c/o productive cough with pale yellow to clear mucus, chest congestion, and increased S.O.B with exertion since d/c inhaled medications for preparation for PFT. Pt states has been off of Spiriva approx 1 month Comments Medications reviewed with patient Verified contact number and pharmacy with patient Zackery Barefoot CMA  October 20, 2009 2:16 PM    Physical Exam  Additional Exam:  GEN: A/Ox3; pleasant , NAD HEENT:  [place]/AT, , EACs-clear, TMs-wnl, NOSE-clear, THROAT-clear NECK:  Supple w/ fair ROM; no JVD; normal carotid  impulses w/o bruits; no thyromegaly or nodules palpated; no lymphadenopathy. RESP  CTA no wheezing  CARD:  RRR, no m/r/g   GI:   Soft & nt; nml bowel sounds; no organomegaly or masses detected. [place]: Warm bil,  no calf tenderness edema, clubbing, pulses intact     Impression & Recommendations:  Problem # 1:  COPD (ICD-496) ? severity. Still need to get PFT's - will treat acute bronchitis - defer the PFT for a month - no BD's until the PFT are done.   Medications Added to Medication List This Visit: 1)  Ventolin Hfa 108 (90 Base) Mcg/act Aers (Albuterol sulfate) .... Inhale 2 puffs every 4 hours as needed (emergency only) 2)  Azithromycin 250 Mg Tabs (Azithromycin) .... Take 2 on the first day, then 1 by mouth once daily until gone  Other Orders: Prescription Created Electronically [phone]) Est. Patient Level IV ([address])  Patient Instructions: 1)  We will perform your PFT's in a month and plan to follow up to review on the same day 2)  Take azithromycin x 5 days 3)  Stay off the Symbicort and Spiriva until after your PFT's Prescriptions: AZITHROMYCIN 250 MG TABS (AZITHROMYCIN) take 2 on the first day, then 1 by mouth once daily until gone  #6 x 0   Entered and Authorized by:   [name] MD   Signed by:   [name] MD on 10/20/2009   Method used:   Elect[facility] to        [facility]  [phone]* (retail)       [address] [place], [place]  [address]       Ph: [phone] or [phone]       Fax: [phone]   RxID:   [id number]    Immunization History:  Influenza Immunization History:    Influenza:  historical (06/01/2009)

## 2010-08-10 NOTE — Progress Notes (Signed)
Summary: Pt needs a 7 day supply of [facility]  Phone Note Call from Patient   Caller: Patient Summary of Call: Pt called and is needing to get a 7 day supply of the Levoxyl and the Paroxetine [facility] [address] [phone], since pt get meds throught mail order and it usually takes 8 days to get meds in mail.      Initial call taken by: Lucy Antigua,  March 17, 2010 10:02 AM    Prescriptions: PAROXETINE HCL 40 MG TABS (PAROXETINE HCL) Take 1 tablet by mouth at bedtime  #7 x 0   Entered by:   Romualdo Bolk, CMA (AAMA)   Authorized by:   Madelin Headings MD   Signed by:   Romualdo Bolk, CMA (AAMA) on 03/17/2010   Method used:   [facility]ronically [facility]        [facility]  [phone]* (retail)       [address] [place], [place]  [address]       Ph: [phone] or [phone]       Fax: [phone]   RxID:   [id number] LEVOXYL 88 MCG  TABS (LEVOTHYROXINE SODIUM) 1 by mouth once daily  #7 x 0   Entered by:   Romualdo Bolk, CMA (AAMA)   Authorized by:   Madelin Headings MD   Signed by:   Romualdo Bolk, CMA (AAMA) on 03/17/2010   Method used:   [facility] to        [facility]  [phone]* (retail)       [address] [place], [place]  [address]       Ph: [phone] or [phone]       Fax: [phone]   RxID:   [id number]

## 2010-08-10 NOTE — Progress Notes (Signed)
Summary: WANTS TO SPEAK TO THE NURSE/CB  Phone Note Call from Patient Call back at Home Phone [phone]   Caller: Patient Call For: BYRUM Summary of Call: PT IS WANTING TO TALK TO THE NURSE WOULD NOT GIVE DETAILS Initial call taken by: Lacinda Axon,  September 28, 2009 10:22 AM  Follow-up for Phone Call        Spoke with pt.  She is wanting to go ahead and get PFT's and followup with RB this wk.  I advised no openings in the PFT nor RB's sched for this wk.  She also requested refill on ventolin and this was sent to pharm.  Follow-up by: Vernie Murders,  September 28, 2009 10:44 AM    Prescriptions: VENTOLIN HFA 108 (90 BASE) MCG/ACT AERS (ALBUTEROL SULFATE) Inhale 2 puffs every 4 hours as needed  #1 x 1   Entered by:   Vernie Murders   Authorized by:   [name] MD   Signed by:   Vernie Murders on 09/28/2009   Method used:   [facility]        [facility]  [phone]* (retail)       [address] [place], [place]  [address]       Ph: [phone] or [phone]       Fax: [phone]   RxID:   [id number]

## 2010-08-10 NOTE — Op Note (Signed)
[facility]   Im[facility] By: Sherian Rein 10/21/2009 07:21:31  _____________________________________________________________________  External Attachment:    Type:   Image     Comment:   External Document

## 2010-08-10 NOTE — Assessment & Plan Note (Signed)
Summary: pt will come in fasting/njr   Vital Signs:  Patient profile:   65 year old female Menstrual status:  postmenopausal Height:      68.75 inches Weight:      183 pounds O2 Sat:      94 % on 2 L/min Pulse rate:   92 / minute BP sitting:   140 / 80  (left arm) Cuff size:   regular  Vitals Entered By: Romualdo Bolk, CMA (AAMA) (May 05, 2010 9:42 AM)  O2 Flow:  2 L/min CC: Annual Visit for Disease Management- Pt is fasting for labs- with pap   History of Present Illness: Jasmine Neal comes in today  for welcome to medicare visit  Here for Medicare AWV:  1.   Risk factors based on Past M, S, F history:  2.   Physical Activities:  limited by knee and pulmonary  3.   Depression/mood:  Sister just died  and stressed  nl grieving.    on paxil  4.   Hearing:  stable  5.   ADL's:  independent     6.   Fall Risk:  no   7.   Home Safety:  reviewed  8.   Height, weight, &visual acuity: wears glasses  9.   Counseling:  done  10.   Labs ordered based on risk factors:  11.           Referral Coordination 12.           Care Plan 13.            Cognitive Assessment  Pt is A&Ox3,affect,speech,memory,attention,&motor skills appear intact.   Since last visit has seen piulmonary and  assessed as severe COPD:   Now on o2 .  for 6 weeks .  back on spririva.  that had originally helped her  Back: ocass  pain LIPIDS: no se of meds  Breast cancer  : stable sis recently died of lung cancer [place].   Ortho left knee torn cartilage ? opinion on doing surgery THyroid no problem with med   Preventive Care Screening  Prior Values:    Pap Smear:  Normal (08/23/2006)    Mammogram:  76095.0^MM BREAST STEREO BIOPSY*L* (04/22/2010)    Colonoscopy:  Normal (07/11/2005)    Last Tetanus Booster:  Historical (08/11/2006)    Last Flu Shot:  Historical (06/01/2009)   Preventive Screening-Counseling & Management  Alcohol-Tobacco     Alcohol drinks/day: 1  Alcohol type: red wine     Smoking Status: quit 1975     Packs/Day: 1ppd     Year Quit: 1975     Pack years: 10  Caffeine-Diet-Exercise     Caffeine use/day: <1 cup     Does Patient Exercise: yes     Type of exercise: yoga     Exercise (avg: min/session): <30     Times/week: 3  Hep-HIV-STD-Contraception     Dental Visit-last 6 months yes     Sun Exposure-Excessive: no  Safety-Violence-Falls     Seat Belt Use: 100     Smoke Detectors: yes     Fall Risk: no  Current Medications (verified): 1)  Levoxyl 88 Mcg  Tabs (Levothyroxine Sodium) .Marland Kitchen.. 1 By Mouth Once Daily 2)  Paroxetine Hcl 40 Mg Tabs (Paroxetine Hcl) .... Take 1 Tablet By Mouth At Bedtime 3)  Tylenol Pm Extra Strength 500-25 Mg  Tabs (Diphenhydramine-Apap (Sleep)) .... As Needed 4)  Ventolin Hfa  108 (90 Base) Mcg/act Aers (Albuterol Sulfate) .... Inhale 2 Puffs Every 4 Hours As Needed (Emergency Only) 5)  Symbicort 160-4.5 Mcg/act Aero (Budesonide-Formoterol Fumarate) .... 2 Puffs Two Times A Day 6)  Spiriva Handihaler 18 Mcg Caps (Tiotropium Bromide Monohydrate) .Marland Kitchen.. 1 Once Daily  Allergies (verified): 1)  ! * Advair  Past History:  Past medical, surgical, family and social histories (including risk factors) reviewed, and no changes noted (except as noted below).  Past Medical History: Reviewed history from 01/05/2009 and no changes required. Breast cancer, hx of right lumpectomy    1990 IIA radiation cmfp adjunct rx  Hx dysplasia  treated with cerv conization COPD  /asthma evaluated by pulmonary  severe by spirometry non smoker currently Osteoarthritis Hyperlipidemia Hypothyroidism Fibromyalgia  Hx abnormal gtt  and fatty liver   Past Surgical History: Reviewed history from 02/01/2008 and no changes required. Sinus surgery   Dr Annalee Genta  on Right. colonoscopy  Past History:  Care Management: Pain Management:will call with dr.  Erline Hau   : Milana Na  Family History: Reviewed history from  06/23/2008 and no changes required. Family History Osteoporosis  mom no hip fracture.  Family History Thyroid disease mother-breast CA father-deceased Mysthenia Gravis sisters x 3-breast CA Sis ter had back surgery from injury working she is 5 years younger than pt. sister  died lung cancer  last week  ex smoker.  oct 2011  Social History: Reviewed history from 01/05/2009 and no changes required. 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 Quit smoking 30+ yrs ago. 20 yr pack year smoking hx. Alcohol use-yes Drug use-no Regular exercise-no    hh of 2   2 cats  Dental Care w/in 6 mos.:  yes Sun Exposure-Excessive:  no Fall Risk:  no  Review of Systems  The patient denies anorexia, fever, weight loss, weight gain, vision loss, chest pain, hemoptysis, abdominal pain, melena, hematochezia, severe indigestion/heartburn, hematuria, transient blindness, unusual weight change, abnormal bleeding, enlarged lymph nodes, angioedema, and breast masses.         back bettter since not working in garden   Physical Exam  General:  alert, well-developed, well-nourished, and well-hydrated.   Head:  normocephalic, atraumatic, and no abnormalities observed.   Eyes:  vision grossly intact, pupils equal, and pupils round.   Ears:  R ear normal, L ear normal, and no external deformities.   Nose:  no nasal discharge.   Mouth:  good dentition and pharynx pink and moist.   Neck:  No deformities, masses, or tenderness noted. Breasts:  no masses.    righ t breast changes  stable no acute findings   left breast normal Lungs:  Normal respiratory effort, chest expands symmetrically. Lungs are clear to auscultation, no crackles or wheezes.no dullness.   Heart:  Normal rate and regular rhythm. S1 and S2 normal without gallop, murmur, click, rub or other extra sounds.no lifts.   Abdomen:  Bowel sounds positive,abdomen soft and non-tender without masses, organomegaly or hernias noted. Rectal:  No external  abnormalities noted. Normal sphincter tone. No rectal masses or tenderness. Genitalia:  Pelvic Exam:        External: normal female genitalia without lesions or masses        Vagina: normal without lesions or masses atrophy         Cervix: normal without lesions or masses        Adnexa: normal bimanual exam without masses or fullness        Uterus: normal by  palpation        Pap smear: performed Msk:  no joint swelling, no joint warmth, and no redness over joints.  some mild creitus left knee  Pulses:  pulses intact without delay   Extremities:  no clubbing cyanosis or edema  Neurologic:  alert & oriented X3, strength normal in all extremities, gait normal, and DTRs symmetrical and normal.   Pt is A&Ox3,affect,speech,memory,attention,&motor skills appear intact.  Skin:  turgor normal, color normal, no ecchymoses, and no petechiae.  sunchanges Cervical Nodes:  No lymphadenopathy noted Axillary Nodes:  No palpable lymphadenopathy Inguinal Nodes:  No significant adenopathy Psych:  Oriented X3, memory intact for recent and remote, good eye contact, not anxious appearing, and not depressed appearing.  tearful at time related to her sisters death  appropriate  cognition   Impression & Recommendations:  Problem # 1:  PREVENTIVE HEALTH CARE (ICD-V70.0) Discussed nutrition,exercise,diet,healthy weight, vitamin D and calcium.  Orders: EKG w/ Interpretation (93000) Obtaining Screening PAP Smear (Z6109) Welcome to Medicare, Physical (U0454)  Problem # 2:  COPD (ICD-496) Assessment: Deteriorated under specilaaty care   Her updated medication list for this problem includes:    Ventolin Hfa 108 (90 Base) Mcg/act Aers (Albuterol sulfate) ..... Inhale 2 puffs every 4 hours as needed (emergency only)    Symbicort 160-4.5 Mcg/act Aero (Budesonide-formoterol fumarate) .Marland Kitchen... 2 puffs two times a day    Spiriva Handihaler 18 Mcg Caps (Tiotropium bromide monohydrate) .Marland Kitchen... 1 once daily  Problem # 3:   BREAST CANCER, HX OF (ICD-V10.3)  Problem # 4:  BACK PAIN WITH RADICULOPATHY (ICD-729.2) stable   Problem # 5:  OSTEOPENIA (ICD-733.90) Assessment: Improved ?  dx  no fracture    needs dexa     higher risk age fam hx athyroid and breast cancer dx.     dis cal vit d    takes some supp.    Problem # 6:  MOURNING (ICD-309.0) ok to use ativan as needed  for funeral etc.  call if needed   Problem # 7:  KNEE PAIN, LEFT (ICD-719.46) cartilage damage     will fu with ortho when appropriate  Problem # 8:  HYPOTHYROIDISM (ICD-244.9)  Her updated medication list for this problem includes:    Levoxyl 88 Mcg Tabs (Levothyroxine sodium) .Marland Kitchen... 1 by mouth once daily  Labs Reviewed: TSH: 0.59 (12/29/2008)    Chol: 209 (12/29/2008)   HDL: 57.90 (12/29/2008)   LDL: DEL (02/25/2008)   TG: 106.0 (12/29/2008)  Problem # 9:  HYPERLIPIDEMIA (ICD-272.4)  Orders: EKG w/ Interpretation (93000)  Labs Reviewed: SGOT: 28 (07/20/2009)   SGPT: 21 (07/20/2009)  Lipid Goals: Chol Goal: 200 (01/05/2009)   HDL Goal: 40 (01/05/2009)   LDL Goal: 160 (01/05/2009)   TG Goal: 150 (01/05/2009)  Prior 10 Yr Risk Heart Disease: Not enough information (01/05/2009)   HDL:57.90 (12/29/2008), 45.8 (02/25/2008)  LDL:DEL (02/25/2008), DEL (08/20/2007)  Chol:209 (12/29/2008), 236 (02/25/2008)  Trig:106.0 (12/29/2008), 181 (02/25/2008)  Problem # 10:  depression/ anxiety has been stable  on paxil for a number of years   Complete Medication List: 1)  Levoxyl 88 Mcg Tabs (Levothyroxine sodium) .Marland Kitchen.. 1 by mouth once daily 2)  Paroxetine Hcl 40 Mg Tabs (Paroxetine hcl) .... Take 1 tablet by mouth at bedtime 3)  Tylenol Pm Extra Strength 500-25 Mg Tabs (Diphenhydramine-apap (sleep)) .... As needed 4)  Ventolin Hfa 108 (90 Base) Mcg/act Aers (Albuterol sulfate) .... Inhale 2 puffs every 4 hours as needed (emergency only) 5)  Symbicort 160-4.5  Mcg/act Aero (Budesonide-formoterol fumarate) .... 2 puffs two times a day 6)  Spiriva  Handihaler 18 Mcg Caps (Tiotropium bromide monohydrate) .Marland Kitchen.. 1 once daily 7)  Lorazepam 0.5 Mg Tabs (Lorazepam) .Marland Kitchen.. 1 by mouth three times a day as needed anxiety  Other Orders: Flu Vaccine 1yrs + MEDICARE PATIENTS (W0981) Administration Flu vaccine - MCR (G0008) Pneumococcal Vaccine (19147) Admin 1st Vaccine (82956)  Patient Instructions: 1)  schedule  fasting  2)  LIPDS, LFTS,TSH. BMP CBCdiff    vitamin D  3)  dx  Hyperlipidemia , hyrpothyroid,  SSrthritis 4)  DEXA   dx  menopausal breast cancer hx and hypothyroid. osteopenia  risk  5)  follow up   to review all results  but will contact about any med changes . 6)  anxiety med as needed Prescriptions: LORAZEPAM 0.5 MG TABS (LORAZEPAM) 1 by mouth three times a day as needed anxiety  #15 x 0   Entered and Authorized by:   Madelin Headings MD   Signed by:   Madelin Headings MD on 05/05/2010   Method used:   Print then Give to Patient   RxID:   [id number]    Orders Added: 1)  Flu Vaccine 70yrs + MEDICARE PATIENTS [Q2039] 2)  Administration Flu vaccine - MCR [G0008] 3)  EKG w/ Interpretation [93000] 4)  Obtaining Screening PAP Smear [Q0091] 5)  Pneumococcal Vaccine [90732] 6)  Admin 1st Vaccine [90471] 7)  Welcome to Medicare, Physical [G0402] 8)  Est. Patient Level III [28413]   Immunizations Administered:  Pneumonia Vaccine:    Vaccine Type: Pneumovax (Medicare)    Site: right deltoid    Mfr: Merck    Dose: 0.5 ml    Route: IM    Given by: Romualdo Bolk, CMA (AAMA)    Exp. Date: 11/04/2011    Lot #: [id number]   Immunizations Administered:  Pneumonia Vaccine:    Vaccine Type: Pneumovax (Medicare)    Site: right deltoid    Mfr: Merck    Dose: 0.5 ml    Route: IM    Given by: Romualdo Bolk, CMA (AAMA)    Exp. Date: 11/04/2011    Lot #: [id number]      Flu Vaccine Consent Questions     Do you have a history of severe allergic reactions to this vaccine? no    Any prior history of allergic reactions to  egg and/or gelatin? no    Do you have a sensitivity to the preservative Thimersol? no    Do you have a past history of Guillan-Barre Syndrome? no    Do you currently have an acute febrile illness? no    Have you ever had a severe reaction to latex? no    Vaccine information given and explained to patient? yes    Are you currently pregnant? no    Lot Number:[id number]   Exp Date:01/08/2011   Site Given  Left Deltoid IMbmedflu Romualdo Bolk, CMA Duncan Dull)  May 05, 2010 9:46 AM

## 2010-08-10 NOTE — Letter (Signed)
Summary: Physician's Statement/[employer]  Physician's Statement/[employer]   Imported By: Sherian Rein 05/07/2010 08:19:55  _____________________________________________________________________  External Attachment:    Type:   Image     Comment:   External Document

## 2010-08-10 NOTE — Progress Notes (Signed)
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 Request forwarded to [facility]. Dena Chavis  October 20, 2009 3:26 PM

## 2010-08-10 NOTE — Assessment & Plan Note (Signed)
Summary: COPD, hypoxemia   Visit Type:  Follow-up Copy to:  [facility] Primary Provider/Referring Provider:  Fabian Sharp  CC:  Followup COPD- had PFTs today.  She states that she stopped spiriva and symbicort x 1 month ago.  She states that the first 2 wks she was okay but breathing has progressively worsened over the past 2 wks- has been wheezing at night and gets out of breath walking from room to room at home.  She has some congestion in chest and has had some cough x 1 wk- minimal clear to yellow sputum.Marland Kitchen  History of Present Illness: 65 yo woman with hx of COPD, allergic rhinitis, sinusitis, remote breast CA (chemo + XRT).  October 13, 2008--Returns for follow up. Doing very well. Finished Prednisone and Augmentin. "Feels like a new person"  Cough/congestion much better. Decreased dyspnea. No wheezing. Breathing back to baseline. Denies chest pain, dyspnea, orthopnea, hemoptysis, fever, n/v/d, edema.   ROV 12/12/08 -- Breathing feeling back to normal. She is still on Symbicort and Spiriva, not convinced that these have really helped her. She is interested in trying to peel meds off.   ROV 09/10/09 -- Returns for follow up. Last Summer we attempted to peel off her BD's one at a time because she wasn't sure they were benefitting her. She did this - didn't see any big change in her symptoms, but still with exertional SOB so restarted Symbicort about 3 months ago. Still with exertional SOB so restarted Spiriva a week ago. She has remotely been on Advair and feels that this helped her more, but there was question about whether she had facial swelling on this med. In retrospect she blames these signs on an infected tooth. Her dyspnea is bothersome when she climbs. Rare wheeze. Daily cough, usually in the am.   ROV 10/20/09 -- returns for f/u. She has been getting over a URI, had been productive cough but improving. She restarted the Symbicort about 2 weeks ago because she felt she missed it - was having more SOB.  She still isn';t sure whether she benefitted. Stopped it 4 days ago. We deferred PFT today because she still doesn't feel baseline. CXR today is clear.   ROV 04/30/10 -- COPD. Last time we tried to assess degree of disease by ordering PFT and enacting trial off meds (Symbicort + Spiriva). She stopped the meds 4 weeks ago, had PFT today. Over last 2 weeks has much more DOE, unable to walk room to room. Increased Ventolin use.   Current Medications (verified): 1)  Levoxyl 88 Mcg  Tabs (Levothyroxine Sodium) .Marland Kitchen.. 1 By Mouth Once Daily 2)  Paroxetine Hcl 40 Mg Tabs (Paroxetine Hcl) .... Take 1 Tablet By Mouth At Bedtime 3)  Tylenol Pm Extra Strength 500-25 Mg  Tabs (Diphenhydramine-Apap (Sleep)) .... As Needed 4)  Ventolin Hfa 108 (90 Base) Mcg/act Aers (Albuterol Sulfate) .... Inhale 2 Puffs Every 4 Hours As Needed (Emergency Only) 5)  Symbicort 160-4.5 Mcg/act Aero (Budesonide-Formoterol Fumarate) .... 2 Puffs Two Times A Day Hold 6)  Spiriva Handihaler 18 Mcg Caps (Tiotropium Bromide Monohydrate) .Marland Kitchen.. 1 Once Daily Hold  Allergies (verified): 1)  ! * Advair  Vital Signs:  Patient profile:   65 year old female Menstrual status:  postmenopausal Weight:      184 pounds O2 Sat:      94 % on Room air Temp:     98.3 degrees F oral Pulse rate:   98 / minute BP sitting:   146 / 90  (  left arm)  Vitals Entered By: Vernie Murders (April 30, 2010 2:25 PM)  O2 Flow:  Room air  Serial Vital Signs/Assessments:  Comments: Ambulatory Pulse Oximetry  Resting; HR_92____    02 Sat_93%ra____  Lap1 (185 feet)   HR_104____   02 Sat_93%ra____ Lap2 (185 feet)   HR_____   02 Sat_____    Lap3 (185 feet)   HR_____   02 Sat_____  ___Test Completed without Difficulty _x__Test Stopped due to: on 2nd lap pt desat to 85%ra and heart rate was 108. put pt on 2 liters and pt went up to 94% and heart rate 101.  Carver Fila  April 30, 2010 3:21 PM    By: Carver Fila    Physical Exam  Additional Exam:   GEN: A/Ox3; pleasant , NAD HEENT:  [place]/AT, , EACs-clear, TMs-wnl, NOSE-clear, THROAT-clear NECK:  Supple w/ fair ROM; no JVD; normal carotid impulses w/o bruits; no thyromegaly or nodules palpated; no lymphadenopathy. RESP  B soft exp wheeze CARD:  RRR, no m/r/g   GI:   Soft & nt; nml bowel sounds; no organomegaly or masses detected. [place]: Warm bil,  no calf tenderness edema, clubbing, pulses intact     Pulmonary Function Test Date: 04/30/2010 Height (in.): 70 Gender: Female  Pre-Spirometry FVC    Value: 1.47 L/min   Pred: 3.64 L/min     % Pred: 40 % FEV1    Value: 0.50 L     Pred: 2.67 L     % Pred: 19 % FEV1/FVC  Value: 34 %     Pred: 72 %     % Pred: - % FEF 25-75  Value: 0.20 L/min   Pred: 2.80 L/min     % Pred: 7 %  Post-Spirometry FVC    Value: 1.82 L/min   Pred: 3.64 L/min     % Pred: 50 % FEV1    Value: 0.72 L     Pred: 2.67 L     % Pred: 27 % FEV1/FVC  Value: 39 %     Pred: 72 %     % Pred: - % FEF 25-75  Value: 0.29 L/min   Pred: 2.80 L/min     % Pred: 10 %  Lung Volumes TLC    Value: 4.08 L   % Pred: 67 % RV    Value: 2.61 L   % Pred: 110 % DLCO    Value: 15.5 %   % Pred: 64 % DLCO/VA  Value: 4.28 %   % Pred: 118 %  Comments: Very severe AFL, positive response to BD. Volumes show superimposed restriction. Decreased diffusion that corrects for alveolar volume. 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 Hypoxemia confirmed on walk today.  - restart Spiriva and Symbicort - start O2 2L/min with exertion.  - ventolin as needed  - ROV 6 weeks  Medications Added to Medication List This Visit: 1)  Symbicort 160-4.5 Mcg/act Aero (Budesonide-formoterol fumarate) .... 2 puffs two times a day hold 2)  Spiriva Handihaler 18 Mcg Caps (Tiotropium bromide monohydrate) .Marland Kitchen.. 1 once daily hold  Other Orders: Est. Patient Level IV (37628) DME Referral  (DME)  Patient Instructions: 1)  Restart Spiriva once daily  2)  Restart 2 puffs Symbicort 160/4.63mcg two times a day  3)  Keep your Ventolin available to use as needed  4)  Walking  oximetry today showed that your oxygen level drops with exertion. We will start oxygen at 2L/min with exertion.  5)  Follow with Dr Delton Coombes in 4 -6 weeks

## 2010-08-10 NOTE — Progress Notes (Signed)
Summary: breathing problem  LMTCBX1-RETURNED CALL  Phone Note Call from Patient Call back at [phone]   Caller: Daughter alexis Call For: byrum Summary of Call: pt having breathing problem. she is not using inhalers per dr byrum until pft is done on monday.daughter concern that her breathing has worsen Initial call taken by: Rickard Patience,  April 29, 2010 1:36 PM  Follow-up for Phone Call        [facility]  Pt hasn't seen RB since 10/20/2009 and pt instructions state to schedule PFTs and OV in 1 MONTH!!  (unsure why she is scheduled this late)   and to stop spiriva and symbicort until after these two tests were done.  Aundra Millet Reynolds LPN  April 29, 2010 3:16 PM   Additional Follow-up for Phone Call Additional follow up Details #1::        pt's daughter Jon Gills Sahli returned call from triage. call [phone]. Tivis Ringer, CNA  April 29, 2010 3:40 PM  pt has been off inhalers for 4 weeks and wanted earlier pft and rov appt--new appts scheduled for friday 10/21 at 10am for pft and 1:30 for appt with rb--daughter aware of both of appts Additional Follow-up by: Philipp Deputy CMA,  April 29, 2010 4:32 PM

## 2010-08-10 NOTE — Progress Notes (Signed)
Summary: adjustment to [employer] paperwork  Phone Note Call from Patient Call back at Home Phone [phone]   Caller: Spouse Call For: byrum Reason for Call: Talk to Nurse, Talk to Doctor Summary of Call: Patient's husband is having problems with paperwork that was faxed to [employer].  Dr. Delton Coombes said patient needs to be on continous O2, but battery life for this is only 1.5 hrs.  This will not last through the flight.  Calling to see if RB could change to pulse,  battery life for this is 3.5.  Please call  for explanation. Initial call taken by: Lehman Prom,  May 05, 2010 10:04 AM  Follow-up for Phone Call        called spoke with patient's husband.  he states that pt has been approved for the flight to [facility] based on the fax that was sent.  however, because "continuous" was circled for the liter flow, 1 battery will only last 1.5hrs meaning that pt will need 6 batteries for the flight and the DME company can only provide 3 batteries.  spouse is requesting that form be changed to state "pulsing" so that 1 battery will last 3hours.  they are leaving tomorrow as asks that this be taken care of this afternoon.  pt's spouse will fax his original to the triage fax for RB to adjust.  lori is aware and will forward msg to RB.  Pt's husband Sherrill Raring) wants to speak with Lawson Fiscal in ref to form for his wife.Darletta Moll  May 05, 2010 3:42 PM   Follow-up by: Boone Master CNA/MA,  May 05, 2010 10:32 AM  Additional Follow-up for Phone Call Additional follow up Details #1::        form corrected by RB, initialed and dated.  i have faxed the form back to [employer] and to the [employer].  called spoke with pt, informed her that this has been done.  told her that the fax machine was dialing her number as we spoke but that if the fax does not go through then she may call back and ask for me as i will hold on to the form until the end of the day. Boone Master CNA/MA  May 05, 2010 3:59 PM     Additional  Follow-up for Phone Call Additional follow up Details #2::    no call back from patient.  revised form handed to lori to be scanned into EMR.   Boone Master CNA/MA  May 05, 2010 5:20 PM

## 2010-08-10 NOTE — Miscellaneous (Signed)
Summary: Orders Update  Clinical Lists Changes  Orders: Added new Test order of T-2 View CXR (71020TC) - Signed 

## 2010-08-10 NOTE — Progress Notes (Signed)
Summary: paperwork/[employer]  Phone Note Call from Patient Call back at Home Phone [phone]   Caller: Spouse--russ Call For: Jasmine Neal Reason for Call: Talk to Nurse Summary of Call: Patient's husband calling concerning [employer] paperwork. Initial call taken by: Lehman Prom,  May 04, 2010 10:41 AM  Follow-up for Phone Call        Form is in Dr. Kavin Leech folder awaiting his review.[name] [facility]  May 04, 2010 3:32 PM  It has already been addressed [name] MD  May 04, 2010 4:52 PM  Follow-up by: [name] MD,  May 04, 2010 4:52 PM

## 2010-08-10 NOTE — Assessment & Plan Note (Signed)
Summary: ROA/FUP/RCD   Vital Signs:  Patient profile:   65 year old female Menstrual status:  postmenopausal Height:      68.75 inches Weight:      185 pounds BMI:     27.62 Pulse rate:   78 / minute BP sitting:   128 / 86  (left arm) Cuff size:   regular  Vitals Entered By: Romualdo Bolk, CMA (AAMA) (June 08, 2010 8:16 AM) CC: follow-up visit on labs   History of Present Illness: Jasmine Neal comes in today  for.fu of multiple medical problems .  Labs are back.   REturns fter sisters funeral. last month. Since her last visit she is doing better . COPD better back on  inhalers and to see pulmonary this week. NOt using O2  a s much  Had bone densitiy  no hx of  fractures LIPIDs pending ? about screening or risk for lung cancer as sis died of lung cancer and she has remote hx of smoking. Psych  doing better . Knee left  about the same .  asks about advisability of surgery.   Preventive Screening-Counseling & Management  Alcohol-Tobacco     Alcohol drinks/day: 1     Alcohol type: red wine     Smoking Status: quit 1975     Packs/Day: 1ppd     Year Quit: 1975     Pack years: 10  Caffeine-Diet-Exercise     Caffeine use/day: <1 cup     Does Patient Exercise: yes     Type of exercise: yoga     Exercise (avg: min/session): <30     Times/week: 3  Current Problems (verified): 1)  Mourning  (ICD-309.0) 2)  Routine Gynecological Exam  (ICD-V72.31) 3)  Osteopenia  (ICD-733.90) 4)  Knee Pain, Left  (ICD-719.46) 5)  Sore Throat  (ICD-462) 6)  Tobacco Use, Quit  (ICD-V15.82) 7)  Adverse Reaction To Medication  (ICD-995.29) 8)  Back Pain With Radiculopathy  (ICD-729.2) 9)  Preventive Health Care  (ICD-V70.0) 10)  Nonspec Elevation of Levels of Transaminase/ldh  (ICD-790.4) 11)  Electrolyte and Fluid Disorders Nec  (ICD-276.9) 12)  Reactive Airway Disease  (ICD-493.90) 13)  Sinusitis  (ICD-473.9) 14)  Back Pain, Lumbar, With Radiculopathy  (ICD-724.4) 15)   Hypothyroidism  (ICD-244.9) 16)  Osteoarthritis  (ICD-715.90) 17)  Family History Osteoporosis  (ICD-V17.8) 18)  Anxiety  (ICD-300.00) 19)  Hyperlipidemia  (ICD-272.4) 20)  Low Back Pain  (ICD-724.2) 21)  Hyperthyroidism  (ICD-242.90) 22)  Breast Cancer, Hx of  (ICD-V10.3) 23)  COPD  (ICD-496)  Current Medications (verified): 1)  Levoxyl 88 Mcg  Tabs (Levothyroxine Sodium) .Marland Kitchen.. 1 By Mouth Once Daily 2)  Paroxetine Hcl 40 Mg Tabs (Paroxetine Hcl) .... Take 1 Tablet By Mouth At Bedtime 3)  Tylenol Pm Extra Strength 500-25 Mg  Tabs (Diphenhydramine-Apap (Sleep)) .... As Needed 4)  Ventolin Hfa 108 (90 Base) Mcg/act Aers (Albuterol Sulfate) .... Inhale 2 Puffs Every 4 Hours As Needed (Emergency Only) 5)  Symbicort 160-4.5 Mcg/act Aero (Budesonide-Formoterol Fumarate) .... 2 Puffs Two Times A Day 6)  Spiriva Handihaler 18 Mcg Caps (Tiotropium Bromide Monohydrate) .Marland Kitchen.. 1 Once Daily  Allergies (verified): No Known Drug Allergies  Past History:  Past medical, surgical, family and social histories (including risk factors) reviewed for relevance to current acute and chronic problems.  Past Medical History: Breast cancer, hx of right lumpectomy    1990 IIA radiation cmfp adjunct rx  Hx dysplasia  treated with cerv conization COPD  /  asthma evaluated by pulmonary  severe by spirometry non smoker currently Osteoarthritis Hyperlipidemia Hypothyroidism Fibromyalgia  Hx abnormal gtt  and fatty liver   US done 2010  Past Surgical History: Reviewed history from 02/01/2008 and no changes required. Sinus surgery   Dr Annalee Genta  on Right. colonoscopy  Past History:  Care Management: Pain Management:will call with dr.  Erline Hau   : Milana Na  Family History: Reviewed history from 05/05/2010 and no changes required. Family History Osteoporosis  mom no hip fracture.  Family History Thyroid disease mother-breast CA father-deceased Mysthenia Gravis sisters x 3-breast CA Sis ter had back  surgery from injury working she is 5 years younger than pt. sister  died lung cancer  last week  ex smoker.  oct 2011  Social History: Reviewed history from 01/05/2009 and no changes required. 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 Quit smoking 30+ yrs ago. 20 yr pack year smoking hx. Alcohol use-yes Drug use-no Regular exercise-no    hh of 2   2 cats    Review of Systems  The patient denies anorexia, fever, weight loss, weight gain, vision loss, decreased hearing, chest pain, abdominal pain, melena, hematochezia, severe indigestion/heartburn, muscle weakness, transient blindness, unusual weight change, abnormal bleeding, enlarged lymph nodes, and angioedema.    Physical Exam  General:  Well-developed,well-nourished,in no acute distress; alert,appropriate and cooperative throughout examination Psych:  Oriented X3, normally interactive, good eye contact, not anxious appearing, and not depressed appearing.   reviewed dexa scan and lab studies    Impression & Recommendations:  Problem # 1:  HYPERLIPIDEMIA (ICD-272.4)  Labs Reviewed: SGOT: 42 (05/25/2010)   SGPT: 39 (05/25/2010)  Lipid Goals: Chol Goal: 200 (01/05/2009)   HDL Goal: 40 (01/05/2009)   LDL Goal: 160 (01/05/2009)   TG Goal: 150 (01/05/2009)  Prior 10 Yr Risk Heart Disease: Not enough information (01/05/2009)   HDL:58.60 (05/25/2010), 57.90 (12/29/2008)  LDL:DEL (02/25/2008), DEL (08/20/2007)  Chol:211 (05/25/2010), 209 (12/29/2008)  Trig:99.0 (05/25/2010), 106.0 (12/29/2008)  Problem # 2:  OSTEOPENIA (ICD-733.90) no sig change and no fracture .   dsic  prevention strategies  nl vit d level .  Problem # 3:  fam hx of lung cancer  disc about   low rad ct scan for screening  unsure of advisabiity of this  Problem # 4:  MOURNING (ICD-309.0) stable  Problem # 5:  KNEE PAIN, LEFT (ICD-719.46) cartilage tear    disc surgery  second opinons etc  Problem # 6:  NONSPEC ELEVATION OF LEVELS OF TRANSAMINASE/LDH  (ICD-790.4) Assessment: Improved present and mild but improved   poss form tylenol nsaid in the past .  nl [place] in 2010.  Problem # 7:  HYPOTHYROIDISM (ICD-244.9)  no change in meds  Her updated medication list for this problem includes:    Levoxyl 88 Mcg Tabs (Levothyroxine sodium) .Marland Kitchen... 1 by mouth once daily  Labs Reviewed: TSH: 0.69 (05/25/2010)    Chol: 211 (05/25/2010)   HDL: 58.60 (05/25/2010)   LDL: DEL (02/25/2008)   TG: 99.0 (05/25/2010)  Orders: Prescription Created Electronically [phone])  Complete Medication List: 1)  Levoxyl 88 Mcg Tabs (Levothyroxine sodium) .Marland Kitchen.. 1 by mouth once daily 2)  Paroxetine Hcl 40 Mg Tabs (Paroxetine hcl) .... Take 1 tablet by mouth at bedtime 3)  Tylenol Pm Extra Strength 500-25 Mg Tabs (Diphenhydramine-apap (sleep)) .... As needed 4)  Ventolin Hfa 108 (90 Base) Mcg/act Aers (Albuterol sulfate) .... Inhale 2 puffs every 4 hours as needed (emergency only) 5)  Symbicort  160-4.5 Mcg/act Aero (Budesonide-formoterol fumarate) .... 2 puffs two times a day 6)  Spiriva Handihaler 18 Mcg Caps (Tiotropium bromide monohydrate) .Marland Kitchen.. 1 once daily  Patient Instructions: 1)  LFTS in 6 months   2)  wellness visit in a year. 3)  call in mean time if needed Prescriptions: LEVOXYL 88 MCG  TABS (LEVOTHYROXINE SODIUM) 1 by mouth once daily  #90 x 3   Entered and Authorized by:   Madelin Headings MD   Signed by:   Madelin Headings MD on 06/08/2010   Method used:   Electronically to        [facility]* (retail)             ,          Ph: [phone]       Fax: [phone]   RxID:   [id number] PAROXETINE HCL 40 MG TABS (PAROXETINE HCL) Take 1 tablet by mouth at bedtime  #90 x 3   Entered and Authorized by:   Madelin Headings MD   Signed by:   Madelin Headings MD on 06/08/2010   Method used:   Electronically to        [facility]* (retail)             ,          Ph: [phone]       Fax: [phone]   RxID:   [id number]    Orders Added: 1)   Est. Patient Level IV [75643] 2)  Prescription Created Electronically [phone]   greater than 50% of visit spent in counseling  25 minutes

## 2010-08-10 NOTE — Assessment & Plan Note (Signed)
Summary: COPD, RAD   Visit Type:  Follow-up Copy to:  [facility] Primary Provider/Referring Provider:  Panosh  CC:  Follow up , pt is here to review cxr, sister was daignosed with stage 4 lung cancer , has tried symbicot and spiriva mild improvement felt she does better on advair, and c/o increase sob when climbing stairs.  History of Present Illness: 65 yo woman with hx of COPD, allergic rhinitis, sinusitis, remote breast CA (chemo + XRT).  March 29- 2010--Presents for acute office visit. Complains of increased SOB, wheezing, prod cough with thick green "sticky stuff" x4days -not using spiriva. over last month worse, cant do antything w/o dyspnea.  --Tx w Augmentin/ Prednsione   October 13, 2008--Returns for follow up. Doing very well. Finished Prednisone and Augmentin. "Feels like a new person"  Cough/congestion much better. Decreased dyspnea. No wheezing. Breathing back to baseline. Denies chest pain, dyspnea, orthopnea, hemoptysis, fever, n/v/d, edema.   ROV 12/12/08 -- Breathing feeling back to normal. She is still on Symbicort and Spiriva, not convinced that these have really helped her. She is interested in trying to peel meds off.   ROV 09/10/09 -- Returns for follow up. Last Summer we attempted to peel off her BD's one at a time because she wasn't sure they were benefitting her. She did this - didn't see any big change in her symptoms, but still with exertional SOB so restarted Symbicort about 3 months ago. Still with exertional SOB so restarted Spiriva a week ago. She has remotely been on Advair and feels that this helped her more, but there was question about whether she had facial swelling on this med. In retrospect she blames these signs on an infected tooth. Her dyspnea is bothersome when she climbs. Rare wheeze. Daily cough, usually in the am.   Preventive Screening-Counseling & Management  Alcohol-Tobacco     Smoking Status: quit 1975     Packs/Day: 1ppd     Year Quit: 1975  Pack years: 10  Current Medications (verified): 1)  Levoxyl 88 Mcg  Tabs (Levothyroxine Sodium) .Marland Kitchen.. 1 By Mouth Once Daily 2)  Paroxetine Hcl 40 Mg Tabs (Paroxetine Hcl) .... Take 1 Tablet By Mouth At Bedtime 3)  Tylenol Pm Extra Strength 500-25 Mg  Tabs (Diphenhydramine-Apap (Sleep)) .... As Needed 4)  Ventolin Hfa 108 (90 Base) Mcg/act Aers (Albuterol Sulfate) .... Inhale 2 Puffs Every 4 Hours As Needed 5)  Symbicort 160-4.5 Mcg/act Aero (Budesonide-Formoterol Fumarate) .... 2 Puffs Two Times A Day 6)  Spiriva Handihaler 18 Mcg Caps (Tiotropium Bromide Monohydrate) .Marland Kitchen.. 1 Once Daily  Allergies (verified): 1)  ! * Advair  Social History: Packs/Day:  1ppd Pack years:  10 Smoking Status:  quit 1975  Vital Signs:  Patient profile:   65 year old female Menstrual status:  postmenopausal Height:      69 inches Weight:      187 pounds BMI:     27.71 O2 Sat:      96 % on Room air Pulse rate:   97 / minute BP sitting:   150 / 90  (left arm)  Vitals Entered By: Renold Genta RCP, LPN (September 10, 1608 9:44 AM)  O2 Sat at Rest %:  96% O2 Flow:  Room air CC: Follow up , pt is here to review cxr, sister was daignosed with stage 4 lung cancer , has tried symbicot and spiriva mild improvement felt she does better on advair, c/o increase sob when climbing stairs Is Patient Diabetic?  No Comments Medications reviewed with patient Renold Genta RCP, LPN  September 11, 1658 9:52 AM    Physical Exam  Additional Exam:  GEN: A/Ox3; pleasant , NAD HEENT:  [place]/AT, , EACs-clear, TMs-wnl, NOSE-clear, THROAT-clear NECK:  Supple w/ fair ROM; no JVD; normal carotid impulses w/o bruits; no thyromegaly or nodules palpated; no lymphadenopathy. RESP  CTA no wheezing  CARD:  RRR, no m/r/g   GI:   Soft & nt; nml bowel sounds; no organomegaly or masses detected. Musco: Warm bil,  no calf tenderness edema, clubbing, pulses intact     Impression & Recommendations:  Problem # 1:  COPD (ICD-496) Stop  Symbicort and Spiriva We will perform full PFT's off medication in 3 - 4 weeks.  Follow up with Dr Delton Coombes on the same day as your PFTs to review CXR next visit.   Medications Added to Medication List This Visit: 1)  Symbicort 160-4.5 Mcg/act Aero (Budesonide-formoterol fumarate) .... 2 puffs two times a day 2)  Spiriva Handihaler 18 Mcg Caps (Tiotropium bromide monohydrate) .Marland Kitchen.. 1 once daily  Other Orders: Est. Patient Level IV (63016)  Patient Instructions: 1)  Stop Symbicort and Spiriva 2)  We will perform full PFT's off medication in 3 - 4 weeks.  3)  Follow up with Dr Delton Coombes on the same day as your PFTs to review 4)  CXR next visit.

## 2010-08-10 NOTE — Miscellaneous (Signed)
Summary: BONE DENSITY  Clinical Lists Changes  Orders: Added new Test order of T-Bone Densitometry (77080) - Signed Added new Test order of T-Lumbar Vertebral Assessment (77082) - Signed 

## 2010-08-10 NOTE — Letter (Signed)
Summary: Date Range: 05-30-05 to 02-17-08/[facility]  Date Range: 05-30-05 to 02-17-08/[facility]   Imported By: Sherian Rein 10/21/2009 07:18:41  _____________________________________________________________________  External Attachment:    Type:   Image     Comment:   External Document

## 2010-08-10 NOTE — Progress Notes (Signed)
Summary: confirm paperwork received  Phone Note Call from Patient Call back at Home Phone [phone]   Caller: Patient Call For: byrum Reason for Call: Talk to Nurse Summary of Call: Calling to confirm we received paperwork that he faxed over to # [phone]. Initial call taken by: Lehman Prom,  May 03, 2010 3:55 PM  Follow-up for Phone Call        Form is in RB's lookat.  Spoke with pt's spouse and notified. Follow-up by: Vernie Murders,  May 03, 2010 3:59 PM

## 2010-08-10 NOTE — Letter (Signed)
Summary: Updated Physician's Statement/[employer]  Updated Physician's Statement/[employer]   Imported By: Sherian Rein 05/12/2010 08:39:36  _____________________________________________________________________  External Attachment:    Type:   Image     Comment:   External Document

## 2010-08-10 NOTE — Progress Notes (Signed)
Summary: Aleve not helping  Phone Note Call from Patient Call back at Home Phone [phone]   Caller: Patient Summary of Call: Pt called wanted to let us know that the aleve is not doing any better. Pt aware that ortho referral has been sent and [facility] will call back with appt time. Initial call taken by: Romualdo Bolk, CMA (AAMA),  January 28, 2010 11:46 AM

## 2010-08-10 NOTE — Assessment & Plan Note (Signed)
Summary: COPD, hypoxemia   Visit Type:  Follow-up Copy to:  [facility] Primary Provider/Referring Provider:  Fabian Sharp  CC:  6 wk followup COPD and hypoxemia.  Breathing has improved since restarted inhaled meds.  No complaints today.Jasmine Neal  History of Present Illness: 65 yo woman with hx of COPD, allergic rhinitis, sinusitis, remote breast CA (chemo + XRT).  ROV 09/10/09 -- Returns for follow up. Last Summer we attempted to peel off her BD's one at a time because she wasn't sure they were benefitting her. She did this - didn't see any big change in her symptoms, but still with exertional SOB so restarted Symbicort about 3 months ago. Still with exertional SOB so restarted Spiriva a week ago. She has remotely been on Advair and feels that this helped her more, but there was question about whether she had facial swelling on this med. In retrospect she blames these signs on an infected tooth. Her dyspnea is bothersome when she climbs. Rare wheeze. Daily cough, usually in the am.   ROV 10/20/09 -- returns for f/u. She has been getting over a URI, had been productive cough but improving. She restarted the Symbicort about 2 weeks ago because she felt she missed it - was having more SOB. She still isn';t sure whether she benefitted. Stopped it 4 days ago. We deferred PFT today because she still doesn't feel baseline. CXR today is clear.   ROV 04/30/10 -- COPD. Last time we tried to assess degree of disease by ordering PFT and enacting trial off meds (Symbicort + Spiriva). She stopped the meds 4 weeks ago, had PFT today. Over last 2 weeks has much more DOE, unable to walk room to room. Increased Ventolin use.   ROV 06/11/10 -- restarted Spiriva and Symbicort; feeling much better, Ventolin use has decreased significantly. Feels back to her baseline. Last time walked and was hypoxemic. Plan was to repeat today to see if she still needed when on meds.   Current Medications (verified): 1)  Levoxyl 88 Mcg  Tabs  (Levothyroxine Sodium) .Jasmine Neal.. 1 By Mouth Once Daily 2)  Paroxetine Hcl 40 Mg Tabs (Paroxetine Hcl) .... Take 1 Tablet By Mouth At Bedtime 3)  Tylenol Pm Extra Strength 500-25 Mg  Tabs (Diphenhydramine-Apap (Sleep)) .... As Needed 4)  Ventolin Hfa 108 (90 Base) Mcg/act Aers (Albuterol Sulfate) .... Inhale 2 Puffs Every 4 Hours As Needed (Emergency Only) 5)  Symbicort 160-4.5 Mcg/act Aero (Budesonide-Formoterol Fumarate) .... 2 Puffs Two Times A Day 6)  Spiriva Handihaler 18 Mcg Caps (Tiotropium Bromide Monohydrate) .Jasmine Neal.. 1 Once Daily  Allergies (verified): No Known Drug Allergies  Vital Signs:  Patient profile:   65 year old female Menstrual status:  postmenopausal Weight:      186 pounds BMI:     27.77 O2 Sat:      95 % on Room air Temp:     98.1 degrees F oral Pulse rate:   83 / minute BP sitting:   114 / 70  (left arm)  Vitals Entered By: Vernie Murders (June 11, 2010 1:42 PM)  O2 Flow:  Room air  Serial Vital Signs/Assessments:  Comments: 2:23 PM Ambulatory Pulse Oximetry  Resting; HR__100___    02 Sat_94%ra____  Lap1 (185 feet)   HR_104___   02 Sat__87%ra___ Lap2 (185 feet)   HR_____   02 Sat_____    Lap3 (185 feet)   HR_____   02 Sat_____  ___Test Completed without Difficulty _x__Test Stopped due EA:[id number] o2 sat   By: Verlon Au  Raskin    Physical Exam  Additional Exam:  GEN: A/Ox3; pleasant , NAD HEENT:  [place]/AT, , EACs-clear, TMs-wnl, NOSE-clear, THROAT-clear NECK:  Supple w/ fair ROM; no JVD; normal carotid impulses w/o bruits; no thyromegaly or nodules palpated; no lymphadenopathy. RESP  distant but no wheeze, improved from last visit CARD:  RRR, no m/r/g   GI:   Soft & nt; nml bowel sounds; no organomegaly or masses detected. Musco: Warm bil,  no calf tenderness edema, clubbing, pulses intact     Impression & Recommendations:  Problem # 1:  COPD (ICD-496) Continue your Spiriva and Symbicort Continue to use your oxygen with exertion We will  discuss the possible benefits of screening CT scan for lung cancer, or any clinical trials on this, at your next visit.  Follow up with Dr Delton Coombes in 4 months or if you have any problems  Patient Instructions: 1)  Continue your Spiriva and Symbicort 2)  Continue to use your oxygen with exertion 3)  We will discuss the possible benefits of screening CT scan for lung cancer, or any clinical trials on this, at your next visit.  4)  Follow up with Dr Delton Coombes in 4 months or if you have any problems

## 2010-08-12 NOTE — Miscellaneous (Signed)
Summary: Orders Update   Clinical Lists Changes  Orders: Added new Service order of Est. Patient Level IV (99214) - Signed 

## 2010-08-12 NOTE — Progress Notes (Signed)
Summary: [facility]-Pt called back again  Phone Note Call from Patient Call back at Home Phone [phone]   Caller: Patient Call For: tammy Reason for Call: Talk to Nurse Summary of Call: Patient needing medical clearance for knee surgery. Patient requesting to speak to Tammy P. Initial call taken by: Lehman Prom,  July 23, 2010 10:04 AM  Follow-up for Phone Call        Spoke with pt.  She states that she is wanting to have arthroscopic knee surgery and was told by Dr Jillyn Hidden who will be doing the surgery, that with her being on o2, needs to discuss this with RB first.  Pt last seen 06/12/11.  Pt aware that RB out of the office until 07/27/10.  Pls advise, thanks Follow-up by: Vernie Murders,  July 23, 2010 10:26 AM  Additional Follow-up for Phone Call Additional follow up Details #1::        pt still waiting to hear back from dr b or nurse re: same. Tivis Ringer, CNA  July 30, 2010 11:21 AM    Additional Follow-up for Phone Call Additional follow up Details #2::    Spoke with pt.  Surgery is not scheduled yet.  Pt wanted to talk with Dr Delton Coombes first.  Instrcuted pt that we would have Dr Delton Coombes address this when he is back in office on Monday, 08-02-10. Abigail Miyamoto RN  July 30, 2010 11:38 AM   Spoke to pt today. reviewed her increased risk for anaesthesia due to COPD. She understands. i will send a leter to Dr Fonnie Jarvis, detailing the risks. [name] MD  August 03, 2010 9:02 AM  Follow-up by: [name] MD,  August 03, 2010 9:02 AM

## 2010-08-12 NOTE — Letter (Signed)
Summary: Surgical Clearence Letter  [facility]  [address]   [place], [place] [address]   Phone: [phone]  Fax: [phone]      08/05/2010 MRN: [id number] RE: [name] [address] [place], [place]  [address]  Dr Jene Every [address] [place], [place]  [address] Fax [phone]  Primary Provider/Referring Provider: Dr Berniece Andreas  Pulmonary Provider: Dr Levy Pupa  Dear Dr Shelle Iron:  This letter is regarding our common patient Ms. [name]. As you know she is a pleasant 65 yo woman being evaluated for arthroscopic knee surgery. I see her for Chronic Obstructive Pulmonary Disease, most recently on 06/11/10. Her lung disease falls into the severe range based on documented exertional hypoxemia and spirometry (FEV1 0.7L). This places her at increased risk for pulmonary complications when undergoing general anaesthsia that include prolonged mechanical ventilation or even death. That being said, she appears to be well compensated at this time, is compliant with her inhaled medications, and should not be precluded from a procedure to help her knee if the benefits are felt to outweigh these risks. If I can be helpful in any way either pre- or post-op, please do not hesitate to contact me.   A copy of the detailed office note from 06/11/10 will be sent to you by fax as soon as possible.   Sincerely,   [name] MD, PhD     This letter has been electronically signed by the physician.

## 2010-08-18 NOTE — Progress Notes (Signed)
Summary: refill on symbicort and spiriva  Phone Note Call from Patient Call back at [facility] Phone [phone]   Caller: Patient Call For: Ra Pfiester Reason for Call: Refill Medication Summary of Call: FYI: Pt states that [facility] will be faxing a form today for refills on h er spiriva and symbicort. Initial call taken by: Darletta Moll,  August 09, 2010 10:09 AM  Follow-up for Phone Call        refills sent by [facility] this AM. Pt aware.Carron Curie CMA  August 09, 2010 11:04 AM

## 2010-08-24 ENCOUNTER — Telehealth (INDEPENDENT_AMBULATORY_CARE_PROVIDER_SITE_OTHER): Payer: Self-pay | Admitting: *Deleted

## 2010-09-01 NOTE — Progress Notes (Signed)
  Phone Note Other Incoming   Request: Send information Summary of Call: Patient completed a [facility] medical release form requesting copies of her records. Request forwarded to [employer].  From the care of Dr. Jayme Cloud to Dr. Delton Coombes...please mail to patient once completed.

## 2010-09-20 ENCOUNTER — Other Ambulatory Visit: Payer: Self-pay | Admitting: Internal Medicine

## 2010-09-20 DIAGNOSIS — Z09 Encounter for follow-up examination after completed treatment for conditions other than malignant neoplasm: Secondary | ICD-10-CM

## 2010-09-27 ENCOUNTER — Other Ambulatory Visit: Payer: Self-pay | Admitting: Specialist

## 2010-09-27 ENCOUNTER — Encounter (HOSPITAL_COMMUNITY): Payer: Medicare Other | Attending: Specialist

## 2010-09-27 DIAGNOSIS — Z01812 Encounter for preprocedural laboratory examination: Secondary | ICD-10-CM | POA: Insufficient documentation

## 2010-09-27 DIAGNOSIS — Z79899 Other long term (current) drug therapy: Secondary | ICD-10-CM | POA: Insufficient documentation

## 2010-09-27 LAB — CBC
MCH: 30.2 pg (ref 26.0–34.0)
MCV: 93.8 fL (ref 78.0–100.0)
Platelets: 183 10*3/uL (ref 150–400)
RDW: 12.9 % (ref 11.5–15.5)

## 2010-10-07 ENCOUNTER — Ambulatory Visit (HOSPITAL_COMMUNITY)
Admission: RE | Admit: 2010-10-07 | Discharge: 2010-10-07 | Disposition: A | Discharge status: Home / Self Care | Payer: Medicare Other | Source: Ambulatory Visit | Attending: Specialist | Admitting: Specialist

## 2010-10-07 DIAGNOSIS — M1710 Unilateral primary osteoarthritis, unspecified knee: Secondary | ICD-10-CM | POA: Insufficient documentation

## 2010-10-07 DIAGNOSIS — M23302 Other meniscus derangements, unspecified lateral meniscus, unspecified knee: Secondary | ICD-10-CM | POA: Insufficient documentation

## 2010-10-07 DIAGNOSIS — M23329 Other meniscus derangements, posterior horn of medial meniscus, unspecified knee: Secondary | ICD-10-CM | POA: Insufficient documentation

## 2010-10-07 DIAGNOSIS — J4489 Other specified chronic obstructive pulmonary disease: Secondary | ICD-10-CM | POA: Insufficient documentation

## 2010-10-07 DIAGNOSIS — J449 Chronic obstructive pulmonary disease, unspecified: Secondary | ICD-10-CM | POA: Insufficient documentation

## 2010-10-07 DIAGNOSIS — M2240 Chondromalacia patellae, unspecified knee: Secondary | ICD-10-CM | POA: Insufficient documentation

## 2010-10-14 NOTE — Op Note (Signed)
Jasmine Neal, Jasmine Neal               ACCOUNT NO.:  [id number]  MEDICAL RECORD NO.:  [id number]           PATIENT TYPE:  O  LOCATION:  DAYL                         FACILITY:  [facility]  PHYSICIAN:  Jene Every, M.D.    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  POSTOPERATIVE DIAGNOSES: 1. Degenerative joint disease of the left knee with medial meniscus     tear with pan grade 3 chondromalacia in the medial compartment,     extensive complex tear of the medial meniscus, small grade 4 lesion     of the femoral condyle. 2. Degenerative tearing of the lateral meniscus. 3. 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  PROCEDURE PERFORMED: 1. Left knee arthroscopy. 2. Partial medial and lateral meniscectomy. 3. Light chondroplasty medial femoral condyle patella. 4. Synovectomy.  BRIEF HISTORY:  [age], knee pain, locking, giving way, swelling despite rest, activity modification, corticosteroid injection.  MRI indicating meniscus tear.  Indicated for arthroscopic debridement. Risks and benefits were discussed including bleeding, infection, no change in symptoms, worsening of symptoms, need for repeat debridement, DVT, PE, anesthetic complications, need for total-knee, et Karie Soda.  TECHNIQUE:  The patient in the supine position.  After the induction of adequate spinal anesthesia, 2 grams Kefzol, left lower extremity was prepped and draped in the usual sterile fashion.  A lateral parapatellar portal and a superomedial parapatellar portal was fashioned with a #11 blade.  Ingress cannula atraumatically placed.  Irrigant was utilized to insufflate the joint.  Under direct visualization, a medial parapatellar portal was fashioned with a #11 blade after localization with an 18- gauge needle sparing the medial  meniscus.  We did not use a suprapatellar portal.  We drained extensive synovial fluid with loose cartilaginous debris initially.  Inspection of the medial compartment revealed extensive pan grade 3 changes of the compartment.  Light chondroplasty was performed of the femoral condyle and tibial plateau. There was a small grade 4 lesion on the weightbearing service a centimeter by a centimeter.  Very light cartilage remaining.  There was extensive tearing and absence of the medial meniscus from the junction of the middle and posterior third and the entire posterior third involving the majority of the meniscus.  We resected the meniscus to a stable base.  There was an area where, at that junction, there was absence to the capsule.  Therefore I contoured the leading edge and I flex extended the knee with axial load applied without evidence of incarceration of the tear between the femur and the tibia.  Following this, I then examined the intercondylar notch.  Hypertrophic synovitis was noted and this was shaved.  The [facility] was unremarkable.  Lateral compartment revealed grade 3 changes of the femoral condyle and tibia. There was radial tearing of the lateral meniscus.  This was shaved to a stable base.  Remnant was stable to probe palpation.  Suprapatellar pouch revealed grade 3 changes of the patella, normal patellofemoral tracking, grade 3 changes of  the sulcus.  Light chondroplasty was performed here.  Gutters were unremarkable. Reexamined the medial compartment.  Further probing and testing.  No further pathology.  Minimal arthroscopic intervention.  I therefore removed all instrumentation.  Portals were closed with 4-0 nylon simple sutures, 0.25% Marcaine with epinephrine was infiltrated in the joint. The wound was dressed sterilely.  Awoken without difficulty and transferred to the recovery room in satisfactory condition.  The patient tolerated the procedure well and there were  no complications.  Minimal blood loss.  Note concern that the patient may require a total-knee replacement.  She did have slight flexion contracture as well that did not resolve with her spinal anesthesia.     Jene Every, M.D.     Cordelia Pen  D:  10/07/2010  T:  10/07/2010  Job:  [id number]  Electronically Signed by Jene Every M.D. on 10/14/2010 12:54:11 PM

## 2010-10-26 ENCOUNTER — Ambulatory Visit
Admission: RE | Admit: 2010-10-26 | Discharge: 2010-10-26 | Disposition: A | Discharge status: Home / Self Care | Payer: Medicare Other | Source: Ambulatory Visit | Attending: Internal Medicine | Admitting: Internal Medicine

## 2010-10-26 DIAGNOSIS — Z09 Encounter for follow-up examination after completed treatment for conditions other than malignant neoplasm: Secondary | ICD-10-CM

## 2010-11-05 ENCOUNTER — Encounter: Payer: Self-pay | Admitting: Emergency Medicine

## 2010-11-08 ENCOUNTER — Encounter: Payer: Self-pay | Admitting: Emergency Medicine

## 2010-11-08 ENCOUNTER — Ambulatory Visit (INDEPENDENT_AMBULATORY_CARE_PROVIDER_SITE_OTHER): Payer: Medicare Other | Admitting: Emergency Medicine

## 2010-11-08 VITALS — BP 138/86 | HR 90 | Temp 98.1°F | Ht 69.5 in | Wt 185.0 lb

## 2010-11-08 DIAGNOSIS — J449 Chronic obstructive pulmonary disease, unspecified: Secondary | ICD-10-CM

## 2010-11-08 DIAGNOSIS — J4489 Other specified chronic obstructive pulmonary disease: Secondary | ICD-10-CM

## 2010-11-08 NOTE — Patient Instructions (Signed)
Please continue your Spiriva and Symbicort as you are taking them Walking oximetry today Follow up with Dr Delton Coombes in 4 months or sooner if you have any problems.

## 2010-11-08 NOTE — Assessment & Plan Note (Signed)
Continue same Spiriva and Symbicort Walking oximetry today ROV 4 months or prn

## 2010-11-08 NOTE — Progress Notes (Signed)
  Subjective:    Patient ID: Jasmine Neal, female    DOB: 1946-03-16, 65 y.o.   MRN: [id number]  HPI 65 yo woman with hx of COPD, allergic rhinitis, sinusitis, remote breast CA (chemo + XRT).   ROV 09/10/09 -- Returns for follow up. Last Summer we attempted to peel off her BD's one at a time because she wasn't sure they were benefitting her. She did this - didn't see any big change in her symptoms, but still with exertional SOB so restarted Symbicort about 3 months ago. Still with exertional SOB so restarted Spiriva a week ago. She has remotely been on Advair and feels that this helped her more, but there was question about whether she had facial swelling on this med. In retrospect she blames these signs on an infected tooth. Her dyspnea is bothersome when she climbs. Rare wheeze. Daily cough, usually in the am.   ROV 10/20/09 -- returns for f/u. She has been getting over a URI, had been productive cough but improving. She restarted the Symbicort about 2 weeks ago because she felt she missed it - was having more SOB. She still isn';t sure whether she benefitted. Stopped it 4 days ago. We deferred PFT today because she still doesn't feel baseline. CXR today is clear.   ROV 04/30/10 -- COPD. Last time we tried to assess degree of disease by ordering PFT and enacting trial off meds (Symbicort + Spiriva). She stopped the meds 4 weeks ago, had PFT today. Over last 2 weeks has much more DOE, unable to walk room to room. Increased Ventolin use.   ROV 06/11/10 -- restarted Spiriva and Symbicort; feeling much better, Ventolin use has decreased significantly. Feels back to her baseline. Last time walked and was hypoxemic. Plan was to repeat today to see if she still needed when on meds.   ROV 11/08/10 -- moderately severe COPD, allergic rhinitis. Had her L knee SGY under a local block instead of general anaesthesia. Taking Spiriva and Symbicort reliably. Unable to use her O2 as well as in the past due to her knee,  worried about getting tangled up in the tubing. She is hypothyroid, wonders about whether this contributes to her dyspnea. In general feels at baseline. Wants to walk today to assess oxygenation.   Review of Systems As per HPI    Objective:   Physical Exam Gen: Pleasant, well-nourished, in no distress,  normal affect  ENT: No lesions, no postnasal drip  Lungs: No use of accessory muscles, no dullness to percussion, clear without rales or rhonchi  Cardiovascular: RRR, heart sounds normal, no murmur or gallops, no peripheral edema  Musculoskeletal: No deformities, no cyanosis or clubbing  Neuro: alert, non focal  Skin: Warm, no lesions or rashes        Assessment & Plan:

## 2010-11-23 NOTE — Assessment & Plan Note (Signed)
[facility]                                 ON-CALL NOTE   Jasmine Neal, Jasmine Neal                      MRN:          [id number]  DATE:09/23/2007                            DOB:          May 13, 1946    PRIMARY:  Berniece Andreas, MD   SUBJECTIVE:  Left side of face is swollen around eye.  She is not sure  whether this is secondary to the sinus infection symptoms she has been  having or from a recent dental cleaning.  She denies difficulty  swallowing or difficulty breathing.  She has no fever.   ASSESSMENT/PLAN:  Unclear facial swelling unlikely due to allergic  reaction.  Recommended evaluation with primary care in the morning.     Kerby Nora, MD  Electronically Signed    AB/MedQ  DD: 09/23/2007  DT: 09/23/2007  Job #: [id number]

## 2010-11-26 NOTE — Discharge Summary (Signed)
Jasmine Neal, Jasmine Neal               ACCOUNT NO.:  [id number]   MEDICAL RECORD NO.:  [id number]          PATIENT TYPE:  INP   LOCATION:  3309                         FACILITY:  [facility]   PHYSICIAN:  [name], M.D.  DATE OF BIRTH:  1946/04/16   DATE OF ADMISSION:  08/04/2005  DATE OF DISCHARGE:  08/05/2005                                 DISCHARGE SUMMARY   PRINCIPAL DISCHARGE DIAGNOSIS:  Negative pressure pulmonary edema.   SECONDARY DISCHARGE DIAGNOSES:  1.  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  2.  Chronic sinusitis status post sinus surgery.   DISCHARGE DIET:  A regular diet.   DISCHARGE ACTIVITY:  As tolerated with no restrictions.   WOUND CARE:  Per Dr. Thurmon Fair instruction sheet; and the patient has a  copy of this to take home.   BRIEF HOSPITAL COURSE:  Jasmine Neal is 65 year old woman followed by Dr.  Jayme Cloud in pulmonary clinic for COPD and reactive airways disease who also  has chronic sinusitis. She underwent elective endoscopic sinus surgery on  August 04, 2005. Her postoperative course was complicated by negative  pressure pulmonary edema. She received diuretics and was successfully  extubated to supplemental oxygen. Over the next 18-24 hours her oxygenation  improved as did her mild alveolar infiltrates on chest x-ray. She was  feeling close to her baseline at rest on the morning of August 05, 2005.  Her oxygen saturations were 95% on room air while at rest. Ambulatory  oximetry revealed that she did desaturate to 84% with walking. She will,  therefore, be discharged home with temporary supplemental oxygen at 3 L/min  to be used with any exertion.   DISCHARGE MEDICATIONS:  1.  Percocet 5/325 mg 1-2 q.4-6 h. p.r.n. for pain.  2.  Levoxyl 0.05 mg p.o. daily.  3.  Paroxetine 40 mg p.o. daily.  4.  Advair 250/50 1 inhalation b.i.d.  5.  Spiriva 1 inhalation daily.  6.  Albuterol 2 puffs q.4 h. p.r.n. shortness of breath or  wheezing.  7.  Levofloxacin 500 mg p.o. daily to complete a 10-day course.  8.  Saline nasal spray 4 puffs to each nostril q.1 h.  while awake.  9.  Oxygen at 3 L/min to be used with walking and any exertion.   FOLLOWUP:  Her follow up appointments will be with Dr. Annalee Genta as  previously arranged. Her pulmonary follow up will be with Dr. Levy Pupa  at [facility] on August 12, 2005 at 9 a.m.Marland Kitchen She is instructed to  call [facility] at any time if she experiences any difficulty with  breathing, chest pain, or other worrisome symptoms. She has the telephone  number which is [phone].           ______________________________  [name], M.D.     RSB/MEDQ  D:  08/05/2005  T:  08/05/2005  Job:  [id number]   cc:   Onalee Hua L. Annalee Genta, M.D.  Fax: [phone]   Danice Goltz, M.D. [facility]  [address] [place], [place] [address]

## 2010-11-26 NOTE — Assessment & Plan Note (Signed)
[facility]                               PULMONARY OFFICE NOTE   NAME:Jasmine Neal, Jasmine Neal                      MRN:          [id number]  DATE:05/16/2006                            DOB:          1946-05-12    HISTORY OF PRESENT ILLNESS:  The patient is a 65 year old white female  patient of Dr. Kavin Leech who has a known history of mild COPD and reactive  airways disease. The patient presented today with a one-week history of  productive cough with thick sputum, nasal congestion, hoarseness and sore  throat. The patient denies any hemoptysis, chest pain, orthopnea, recent  travel or antibiotic use. 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 The patient has been using some over-the-counter  products without any relief.   PAST MEDICAL HISTORY:  Reviewed.   CURRENT MEDICATIONS:  Reviewed.   PHYSICAL EXAMINATION:  GENERAL:  The patient is a pleasant female in no  acute distress.  VITAL SIGNS:  She is afebrile with stable vital signs. O2 saturations are  97% on room air.  HEENT:  Nasal mucosa with some mild erythema. TMs are normal. Posterior  pharynx with some mild redness. No exudate is noted. The patient has a small  ulcer along the roof of her mouth.  NECK:  Supple without adenopathy. No JVD.  LUNGS:  Sounds are clear without any wheezing or crackles.  CARDIAC:  Regular rate and rhythm.  ABDOMEN:  Soft, benign.  EXTREMITIES:  Warm without any edema.   IMPRESSION/PLAN:  Acute upper respiratory infection and pharyngitis. Strep  test is pending. Will begin Biaxin XL pack x1. Mucinex DM twice daily.  Rhinocort and saline nasal spray daily. Claritin p.r.n. The patient is to  return as scheduled with Dr. Delton Coombes or sooner if needed.      Rubye Oaks, NP  Electronically Signed     ______________________________  [name], MD   TP/MedQ  DD: 05/16/2006  DT: 05/17/2006  Job #: [id number]

## 2010-11-26 NOTE — H&P (Signed)
Jasmine Neal, Jasmine Neal               ACCOUNT NO.:  [id number]   MEDICAL RECORD NO.:  [id number]          PATIENT TYPE:  INP   LOCATION:  3309                         FACILITY:  [facility]   PHYSICIAN:  [name], M.D.  DATE OF BIRTH:  [birth year]   DATE OF ADMISSION:  08/04/2005  DATE OF DISCHARGE:                                HISTORY & PHYSICAL   CHIEF COMPLAINT:  Acute postoperative hypoxemia.   HISTORY OF PRESENT ILLNESS:  Jasmine Neal is a pleasant 65 year old woman  with a history of COPD and a questionable reactive airways component who  also has chronic sinus disease.  She was referred by Dr. Danice Goltz to  see Dr. Osborn Coho in ENT for evaluation of her sinusitis which was  believed to be driving her pulmonary disease.  She underwent endoscopic  sinus surgery today and per reports the operation went well.  The anesthesia  notes indicate that when she was waking up from general anesthesia she bit  on the endotracheal tube and had a somewhat prolonged occlusion that  required short-acting paralytic in order to reinitiate mechanical  ventilation.  She then developed pink frothy sputum and evolved hypoxemia  consistent with negative pressure pulmonary edema.  They were able to  proceed with her wake up and extubation.  Lasix 20 mg IV was given.  Oxygen  was placed and no re-intubation was required.  She is transferred now to the  [facility] Emergency Department for evaluation and will be admitted for  further management.   ALLERGIES:  No known drug allergies.   PAST MEDICAL HISTORY:  1.  COPD.  2.  Probable reactive airways component.  3.  Chronic sinusitis status post surgery as mentioned.  4.  Hypothyroidism.  5.  Depression, anxiety.  6.  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  7.  Hyperlipidemia.  8.  History of elevated liver function tests per prior chart notes.   SOCIAL HISTORY:  The patient is a 20-pack-year  smoker and quit 30 years ago.  She drinks one alcoholic beverage daily.   FAMILY HISTORY:  Significant for breast cancer, myasthenia gravid and  hypothyroidism.   HOME MEDICATIONS:  1.  Levoxyl 0.05 mg daily.  2.  Paroxetine 40 mg daily.  3.  Advair 250/50 one inhalation b.i.d.  4.  Spiriva one inhalation daily.  5.  Rhinocort AQ one spray to each nostril daily.  6.  She has been treated in the preoperative period with empiric      antibiotics, although she is uncertain as to the particular medication.  7.  She received Medrol dose pack prior to her surgery.   PHYSICAL EXAMINATION:  GENERAL:  This is an awake, alert, pleasant female.  She is tolerating simple facemask.  VITAL SIGNS:  Temperature 98.1, heart rate 110, respiratory rate 24, blood  pressure 125/70.  SPO2 is 98% on a 6 L per minute simple facemask.  HEENT:  She has a dressing on her nares with evidence of some packing in  place.  There  is some fresh blood evident on her nose and mouth.  LUNGS:  Few inspiratory bilateral crackles, but are mostly clear.  There are  no wheezes present and she has good diaphragmatic excursion.  HEART:  Regular rate and rhythm without murmurs, rubs, or gallops.  ABDOMEN:  Soft, slightly obese, nontender with positive bowel sounds.  EXTREMITIES:  No clubbing, cyanosis, edema.  NEUROLOGIC:  She has a grossly nonfocal examination.   Chest x-ray shows bilateral patchy interstitial alveolar infiltrates in both  mid lung fields consistent with pulmonary edema versus infection.   IMPRESSION:  1.  Acute hypoxemia secondary to negative pressure pulmonary edema.  Also,      there may be some evidence of aspiration given the fact that she has      coughed up some bright red blood that probably came from her nasal sinus      surgery.  This is now resolved.  We will need to watch for possible      evolving pneumonitis or acute lung injury versus pneumonia.  I would not      start empiric antibiotics  at this time.  2.  History of chronic obstructive pulmonary disease and reactive airways      disease.  3.  Chronic sinusitis status post nasal sinus surgery.  4.  Depression.  5.  Hypothyroidism.   PLAN:  1.  Will admit the patient to stepdown now.  2.  Will give Lasix IV for one more dose and then follow her chemistries and      chest x-ray in the morning.  3.  Will wean her FiO2 as tolerated.  4.  Will continue her Advair, Spiriva, and p.r.n. albuterol as currently      ordered.  5.  We will consider antibiotics and will obtain cultures if she spikes a      fever.  6.  Will continue her other home medications.  7.  She will receive steroids and antibiotics per Dr. Thurmon Fair      recommendations.           ______________________________  [name], M.D.     RSB/MEDQ  D:  08/05/2005  T:  08/05/2005  Job:  [id number]

## 2010-11-26 NOTE — Op Note (Signed)
Jasmine Neal, Jasmine Neal               ACCOUNT NO.:  [id number]   MEDICAL RECORD NO.:  [id number]          PATIENT TYPE:  OUT   LOCATION:  CARD                         FACILITY:  [facility]   PHYSICIAN:  Oley Balm. Sung Amabile, M.D. [facility] OF BIRTH:  December 11, 1945   DATE OF PROCEDURE:  02/01/2005  DATE OF DISCHARGE:  02/01/2005                                 OPERATIVE REPORT   PROCEDURE:  Cardiopulmonary stress test.   INDICATION FOR TESTING:  Exertional dyspnea.   PROCEDURE:  Cardiopulmonary stress testing was performed on a graded  treadmill. Testing was stopped due to dyspnea. Effort was maximal. At peak  exercise, oxygen uptake was 16.6 mL/kg per minute or 64% of predicted  indicating mild to moderate exercise impairment.   At peak exercise, heart rate was 137 beats per minute or 86% of predicted  maximum indicating that cardiovascular limitation was reached. Oxygen pulse  was normal suggesting normal left ventricular function. Blood pressure  response was normal. EKG tracings revealed no significant abnormalities.   At peak exercise, minute ventilation was 32 liters per minute or 100% of  maximum voluntary ventilation indicating that ventilatory limitation was  reached. Gas exchange parameters revealed borderline hypoxemia with  desaturation to 91%. Also, end tidal carbon dioxide levels rose throughout  exercise suggesting progressive hypercarbia. Baseline spirometry revealed  severe obstruction and mild restriction by lung volume measurements.  However, I suspect that the lung volumes are under estimated due to poor gas  mixing. Postexercise spirometry revealed no evidence of exercise-induced  bronchospasm.   SUMMARY:  Mild exercise impairment due to a ventilatory limitation with  borderline hypoxemia and probable hypercarbia. Spirometry is consistent with  severe obstructive lung disease which appears to be the cause of her  exercise limitation.     ______________________________  Oley Balm Sung Amabile, M.D. [facility]     DBS/MEDQ  D:  03/16/2005  T:  03/16/2005  Job:  [id number]   cc:   Danice Goltz, M.D. [facility]  [address] [place], [place] [address]   Cardiopulmonary Dept.

## 2010-12-07 ENCOUNTER — Other Ambulatory Visit (INDEPENDENT_AMBULATORY_CARE_PROVIDER_SITE_OTHER): Payer: Medicare Other

## 2010-12-07 DIAGNOSIS — T887XXA Unspecified adverse effect of drug or medicament, initial encounter: Secondary | ICD-10-CM

## 2010-12-07 LAB — HEPATIC FUNCTION PANEL
Albumin: 4.1 g/dL (ref 3.5–5.2)
Alkaline Phosphatase: 70 U/L (ref 39–117)
Bilirubin, Direct: 0.1 mg/dL (ref 0.0–0.3)
Total Bilirubin: 0.3 mg/dL (ref 0.3–1.2)

## 2010-12-08 ENCOUNTER — Encounter: Payer: Self-pay | Admitting: *Deleted

## 2010-12-17 ENCOUNTER — Telehealth: Payer: Self-pay | Admitting: Emergency Medicine

## 2010-12-17 NOTE — Telephone Encounter (Signed)
Called spoke with patient who states she has not used her o2 at all since last ov w/ RB on 4.30.12.  Pt had walking oxiemtry done in the office and it shows that patient did not drop below 90% on exertion.  Pt states that [facility] has a tentative pick-up for 6.12.12, of course pending RB's order to dc or continue.  Pt is aware that RB is not in the office today and is okay with a call back next week.  RB please advise, thanks!

## 2010-12-20 NOTE — Telephone Encounter (Signed)
Patient has changed mind about discontinuing oxygen, she would like to keep oxygen now.  Please disregard her request to dc.

## 2011-02-09 ENCOUNTER — Ambulatory Visit (INDEPENDENT_AMBULATORY_CARE_PROVIDER_SITE_OTHER): Payer: Medicare Other | Admitting: Emergency Medicine

## 2011-02-09 ENCOUNTER — Encounter: Payer: Self-pay | Admitting: Emergency Medicine

## 2011-02-09 VITALS — BP 140/80 | HR 91 | Temp 98.7°F | Ht 69.5 in | Wt 179.6 lb

## 2011-02-09 DIAGNOSIS — J449 Chronic obstructive pulmonary disease, unspecified: Secondary | ICD-10-CM

## 2011-02-09 NOTE — Progress Notes (Signed)
  Subjective:    Patient ID: Jasmine Neal, female    DOB: 03-07-1946, 65 y.o.   MRN: [id number]  HPI 65 yo woman with hx of COPD, allergic rhinitis, sinusitis, remote breast CA (chemo + XRT).   ROV 09/10/09 -- Returns for follow up. Last Summer we attempted to peel off her BD's one at a time because she wasn't sure they were benefitting her. She did this - didn't see any big change in her symptoms, but still with exertional SOB so restarted Symbicort about 3 months ago. Still with exertional SOB so restarted Spiriva a week ago. She has remotely been on Advair and feels that this helped her more, but there was question about whether she had facial swelling on this med. In retrospect she blames these signs on an infected tooth. Her dyspnea is bothersome when she climbs. Rare wheeze. Daily cough, usually in the am.   ROV 10/20/09 -- returns for f/u. She has been getting over a URI, had been productive cough but improving. She restarted the Symbicort about 2 weeks ago because she felt she missed it - was having more SOB. She still isn';t sure whether she benefitted. Stopped it 4 days ago. We deferred PFT today because she still doesn't feel baseline. CXR today is clear.   ROV 04/30/10 -- COPD. Last time we tried to assess degree of disease by ordering PFT and enacting trial off meds (Symbicort + Spiriva). She stopped the meds 4 weeks ago, had PFT today. Over last 2 weeks has much more DOE, unable to walk room to room. Increased Ventolin use.   ROV 06/11/10 -- restarted Spiriva and Symbicort; feeling much better, Ventolin use has decreased significantly. Feels back to her baseline. Last time walked and was hypoxemic. Plan was to repeat today to see if she still needed when on meds.   ROV 11/08/10 -- moderately severe COPD, allergic rhinitis. Had her L knee SGY under a local block instead of general anaesthesia. Taking Spiriva and Symbicort reliably. Unable to use her O2 as well as in the past due to her knee,  worried about getting tangled up in the tubing. She is hypothyroid, wonders about whether this contributes to her dyspnea. In general feels at baseline. Wants to walk today to assess oxygenation.   ROV 02/09/11 --  moderately severe COPD, allergic rhinitis. Last time her walking oximetry showed no desats. We discussed d/c O2, but have not done so yet. She stopped Spiriva since last visit, doesn't seem to miss it. She remians on Symbicort.   Review of Systems As per HPI      Objective:   Physical Exam Gen: Pleasant, well-nourished, in no distress,  normal affect  ENT: No lesions, no postnasal drip  Lungs: No use of accessory muscles, no dullness to percussion, clear without rales or rhonchi  Cardiovascular: RRR, heart sounds normal, no murmur or gallops, no peripheral edema  Musculoskeletal: No deformities, no cyanosis or clubbing  Neuro: alert, non focal  Skin: Warm, no lesions or rashes        Assessment & Plan:

## 2011-02-09 NOTE — Patient Instructions (Signed)
Continue your Symbicort twice a day We will stop your home oxygen Follow up with Dr Delton Coombes in 6 months or sooner if you have any problems.

## 2011-02-09 NOTE — Assessment & Plan Note (Signed)
Continue Symbicort bid Stop Spiriva  D/c O2 ROV

## 2011-03-01 ENCOUNTER — Encounter (HOSPITAL_BASED_OUTPATIENT_CLINIC_OR_DEPARTMENT_OTHER): Payer: Self-pay | Admitting: Student

## 2011-03-01 ENCOUNTER — Emergency Department (HOSPITAL_BASED_OUTPATIENT_CLINIC_OR_DEPARTMENT_OTHER)
Admission: EM | Admit: 2011-03-01 | Discharge: 2011-03-01 | Disposition: A | Discharge status: Home / Self Care | Payer: Medicare Other | Attending: Emergency Medicine | Admitting: Emergency Medicine

## 2011-03-01 ENCOUNTER — Telehealth: Payer: Self-pay | Admitting: Internal Medicine

## 2011-03-01 DIAGNOSIS — J449 Chronic obstructive pulmonary disease, unspecified: Secondary | ICD-10-CM | POA: Insufficient documentation

## 2011-03-01 DIAGNOSIS — E785 Hyperlipidemia, unspecified: Secondary | ICD-10-CM | POA: Insufficient documentation

## 2011-03-01 DIAGNOSIS — J4489 Other specified chronic obstructive pulmonary disease: Secondary | ICD-10-CM | POA: Insufficient documentation

## 2011-03-01 DIAGNOSIS — R269 Unspecified abnormalities of gait and mobility: Secondary | ICD-10-CM | POA: Insufficient documentation

## 2011-03-01 DIAGNOSIS — Z853 Personal history of malignant neoplasm of breast: Secondary | ICD-10-CM | POA: Insufficient documentation

## 2011-03-01 DIAGNOSIS — E039 Hypothyroidism, unspecified: Secondary | ICD-10-CM | POA: Insufficient documentation

## 2011-03-01 DIAGNOSIS — R42 Dizziness and giddiness: Secondary | ICD-10-CM | POA: Insufficient documentation

## 2011-03-01 NOTE — Telephone Encounter (Signed)
Pt has been having dizziness for the past wk and also having vertigo symptoms for 3 months, with sweating. Pt req work in ov with Dr Fabian Sharp asap this wk. Pls advise if ok to use sda slot.

## 2011-03-01 NOTE — ED Notes (Signed)
Pt in with c/o vertigo like s/sx x 3 months and reports going off her spiriva inhaler and onset of symptoms. Reports being off balance and walking funny x 1 week and reports onset of extreme dizziness, being off balance this morning. Denies N V D CP LOC SOB.

## 2011-03-01 NOTE — ED Provider Notes (Signed)
History     CSN: [id number] Arrival date & time: 03/01/2011  4:43 PM  Chief Complaint  Patient presents with  . Dizziness   HPI Comments: Patient is a 65 year old woman who says she has had vertigo for 3 months. She wonders if it is because she had the medication Spiriva discontinued. In the past week, the feeling of dizziness this stopped, but she is unsteady on her feet. She had several episodes of unsteadiness today, and therefore called her physician's office, and was advised to seek evaluation in the ED. She has had no treatment for the vertigo.  Patient is a 65 y.o. female presenting with general illness. The history is provided by the patient and medical records. No language interpreter was used.  Illness  The current episode started more than 2 weeks ago. The problem occurs frequently. The problem has been gradually worsening. The problem is moderate. The symptoms are relieved by nothing. Exacerbated by: Standing and walking. Pertinent negatives include no fever, no ear pain and no hearing loss. Wheezing: she has had no treatment for heard dizziness or difficulty with gait.    Past Medical History  Diagnosis Date  . Breast cancer   . Chronic airway obstruction, not elsewhere classified   . Osteoarthritis   . Other and unspecified hyperlipidemia   . Hypothyroidism   . Fibromyalgia     Past Surgical History  Procedure Date  . Nasal sinus surgery   . Breast lumpectomy     Family History  Problem Relation Age of Onset  . Osteoporosis    . Thyroid disease    . Breast cancer Mother   . Breast cancer Sister     x 3  . Lung cancer Sister     History  Substance Use Topics  . Smoking status: Former Smoker -- 0.8 packs/day for 10 years    Types: Cigarettes    Quit date: 07/11/1974  . Smokeless tobacco: Never Used  . Alcohol Use: Yes     1 glass of red wine nightly and socially    OB History    Grav Para Term Preterm Abortions TAB SAB Ect Mult Living                   Review of Systems  Constitutional: Negative.  Negative for fever.  HENT: Negative.  Negative for hearing loss, ear pain and tinnitus.   Eyes: Positive for visual disturbance.  Respiratory: Negative.  Wheezing: she has had no treatment for heard dizziness or difficulty with gait.   Cardiovascular: Negative.  Negative for chest pain and palpitations.  Gastrointestinal: Negative.   Genitourinary: Negative.   Musculoskeletal: Negative.   Neurological: Positive for dizziness and light-headedness.  Psychiatric/Behavioral: Negative.     Physical Exam  BP 146/67  Pulse 84  Temp(Src) 98.1 F (36.7 C) (Oral)  Resp 20  SpO2 96%  Physical Exam  Nursing note and vitals reviewed. Constitutional: She appears well-developed and well-nourished. No distress.  HENT:  Head: Normocephalic and atraumatic.  Right Ear: External ear normal.  Left Ear: External ear normal.  Mouth/Throat: Oropharynx is clear and moist. No oropharyngeal exudate.  Eyes: EOM are normal. Pupils are equal, round, and reactive to light. No scleral icterus.       No nystagmus.  Neck: Normal range of motion. Neck supple.       No carotid bruit.  Cardiovascular: Normal rate and regular rhythm.   Pulmonary/Chest: Effort normal and breath sounds normal.  Abdominal: Soft. Bowel sounds are  normal. She exhibits no distension.  Musculoskeletal: Normal range of motion.  Lymphadenopathy:    She has no cervical adenopathy.  Neurological: She is alert.       Cranial nerves intact sensory and motor intact deep tendon reflexes normal. Finger to nose testing normal. Gait is normal.  Skin: Skin is warm and dry.  Psychiatric: She has a normal mood and affect. Her behavior is normal.    ED Course  Procedures  Course in the ED: Patient was seen and had physical examination. Her examination was entirely benign. Orthostatic vital signs were ordered.6:24 PM  7:42 PM Orthostatic VS were normal.  Advised getting MRI to check for  cerebellar infarct.  She will discuss this with Dr. Fabian Sharp.   Carleene Cooper III, MD 03/01/11 [phone]

## 2011-03-01 NOTE — Telephone Encounter (Signed)
Spoke to pt and she has had vertigo for a long time. She has d/c one of her inhalers. Pt states that she is having more disorientated and sweating. This has been going on for about 1 week. She wakes up every am. From laying down to setting up she has trouble walking straight. Pt hasn't taken her BP.  Spoke to pt and told her to go to [facility] to ED to be evaluated.

## 2011-03-02 ENCOUNTER — Encounter: Payer: Self-pay | Admitting: Internal Medicine

## 2011-03-02 ENCOUNTER — Ambulatory Visit (INDEPENDENT_AMBULATORY_CARE_PROVIDER_SITE_OTHER): Payer: Medicare Other | Admitting: Internal Medicine

## 2011-03-02 VITALS — BP 140/80 | HR 78 | Wt 179.0 lb

## 2011-03-02 DIAGNOSIS — Z853 Personal history of malignant neoplasm of breast: Secondary | ICD-10-CM

## 2011-03-02 DIAGNOSIS — R2689 Other abnormalities of gait and mobility: Secondary | ICD-10-CM

## 2011-03-02 DIAGNOSIS — R269 Unspecified abnormalities of gait and mobility: Secondary | ICD-10-CM

## 2011-03-02 DIAGNOSIS — R42 Dizziness and giddiness: Secondary | ICD-10-CM

## 2011-03-02 NOTE — Progress Notes (Signed)
  Subjective:    Patient ID: Jasmine Neal, female    DOB: 27-May-1946, 65 y.o.   MRN: [id number]  HPI Patient comes in for an acute visit followup from the emergency room evaluation yesterday. She went to the emergency room on our direction after waking up in the morning and having great difficulty walking. She had never had this experience before and describes it as an imbalance and feeling like she was going to fall. She has had some positional vertigo for the last month but that is getting better. Yesterday it was hard to get out of bed. She denies vision change nausea vomiting or actual falling. No chest pain or shortness of breath increases she did have sweating with these episodes . Her exam was nonfocal in the emergency room and it was recommended she get an MRI to rule out a cerebellar stroke or disease. She did not want to go through procedure are in decided to come to the office the next day to talk with [place]. Unfortunately this morning she is feeling a lot better and she feels that her balance is back to normal. She still has some slight vertigo with movement.   .  Breathing better   o2 is some better. She is off one of her inhalers.  Review of Systems Negative for chest pain swelling significant headache hearing loss that is different or tinnitus. The rest as per history of present illness.  Past history family history social history reviewed in the electronic medical record.     Objective:   Physical Exam  Well-developed well-nourished in no acute distress her vital signs are normal HEENT: Normocephalic ;atraumatic , Eyes;  PERRL, EOMs  Full, lids and conjunctiva clear,,Ears: no deformities, canals nl, TM landmarks normal, Nose: no deformity or discharge  Mouth : OP clear without lesion or edema . Neck no masses or bruit Chest:  Clear to A&P without wheezes rales or rhonchi CV:  S1-S2 no gallops or murmurs peripheral perfusion is normal No clubbing cyanosis or edema NEURO:  Oriented x3  motor strength appears?  to be normal cranial nerves III through XII appear to be normal.  DTRs are symmetrical gait appears normal negative Romberg. No tremor. Reviewed emergency room note.     Assessment & Plan:  Episode of gait imbalance and instability in the setting of a couple months of positional vertigo.  Fortunately she is much better today. Discussed reasoning behind imaging. 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 This was a new finding symptom for her. It doesn't appear to be her medications and she hasn't missed a dose of her Paxil. Options discussed. At this point we will do a baby aspirin once a day and she has no significant risk of bleeding per her history. And we will get neurology opinion about advisability of an MRI. However she will notify us if her imbalance symptoms return.   History of breast cancer Hyperlipidemia Thyroid disease Osteoarthritis COPD

## 2011-03-02 NOTE — Patient Instructions (Signed)
Unsure why you had the imbalance spell.    Will do neurology consult as discussed , In the mean time .    Take asa 81 mg per day  If no side effect .

## 2011-03-03 ENCOUNTER — Encounter: Payer: Self-pay | Admitting: Internal Medicine

## 2011-03-15 ENCOUNTER — Encounter: Payer: Self-pay | Admitting: Neurology

## 2011-03-15 ENCOUNTER — Ambulatory Visit (INDEPENDENT_AMBULATORY_CARE_PROVIDER_SITE_OTHER): Payer: Medicare Other | Admitting: Neurology

## 2011-03-15 VITALS — BP 118/84 | HR 84 | Ht 69.5 in | Wt 177.0 lb

## 2011-03-15 DIAGNOSIS — H8110 Benign paroxysmal vertigo, unspecified ear: Secondary | ICD-10-CM

## 2011-03-15 NOTE — Progress Notes (Signed)
Dear Dr. Fabian Sharp,  Thank you for having me see Jasmine Neal in the [facility] Neurology clinic today for her problems with vertigo as well as imbalance and unsteadiness. As you may recall Jasmine Neal is a 65 year old female with a history of hypothyroidism who began having vertigo approximately 3 months ago. It started around the same time she discontinued one of her inhalers. The vertigo is described as a short-lived and horizontal in nature. Usually brought on by turning the head in bed her spells could also be brought on by getting out of bed or getting back into bed. They  would last seconds. She had no accompanying tinnitus or hearing loss. In between bouts she felt normal. There were no obvious precipitating factors for the start of the vertigo.  In the beginning of August the vertigo went away. She did continue to have problems with paroxysmal unsteadiness and imbalance. This is similar to the imbalance and unsteadiness that she experienced with the vertigo without a frank feeling of the room spinning. On August 21 she had a spell accompanied by sweating profusely. She was seen urgently and had a CT of her head. This was unrevealing. Orthostatic vitals at that time were negative. She says that the spells of imbalance continues to get better. She denies any significant numbness in her legs. She is not having any problems with incoordination of her arms or legs.  She's had no double vision, dysphagia or dysarthria during this spell.  Past medical history is significant for hypothyroidism as well as rate reactive airway disease. She's never had a stroke.  Social history she does not smoke and uses alcohol socially.  Medications include levothyroxin, paroxetine and albuterol.  She's allergic to hydrocodone.  Exam: Filed Vitals:   03/15/11 1344  BP: 118/84  Pulse: 84   Orthostatic vitals reveal a 30 point drop from 140s to 118 with no appreciable rise in HR.  In general she is very well  appearing 65 year old woman in no acute distress. Head and neck exam reveals no lymphadenopathy good range of motion in the neck and no scleral icterus. Eye exam is not injected. Cardiac  reveals a regular rhythm with no murmurs rubs or gallops. Chest reveals decreased lung sounds. Skin not significant for rashes. Extremities reveal no significant edema.  Neurologically she is oriented x4 and language is intact. Extraocular movements are without nystagmus in both primary and lateral gaze. Pupils are equal round react to light. Fundi are benign. Muscles of facial expression are symmetric. Facial sensation is intact. Hearing is decreased on the right with the Weber test lateralized to to the right. Rine test reveals better air conduction than bone conduction on the right. Shoulder shrug is symmetric. Tongue uvula palate midline.  Motor exam reveals normal bulk and tone. She is 5 out of 5 muscle strength bilaterally. Reflexes are 2+ throughout. Toes are downgoing. Since sensory testing reveals decreased temperature and vibration in her feet. Coordination reveals normal finger to nose testing as well as no dysdiadochokinesia.  Vestibular testing: Gilberto Better is positive in the left ear. Head thrust test is negative. There is no head shaking nystagmus.    Gait and station were slightly impaired with the residual vertigo after the Dix-Hallpike test. However I thought once she had adequate time they were within normal limits.  Romberg was negative.  Unfortunately the CT of her head was not available for [place] to review but was reportedly unremarkable.  Impression and recommendations:  Jasmine Neal is a 65 year old  woman with a history of reactive airway disease and hypothyroidism who presents with 3 months of paroxysmal vertigo now with resolving periods of incoordination and imbalance. Her Dix-Hallpike test is consistent with the diagnosis of benign paroxysmal positional vertigo. She has orthostatic  hypotension but it is unclear how this may relate it to her symptoms as on standing today she did not have her feelings of imbalance. I think the periods of imbalance that she has been reporting are a form fruste of vertigo and are typically s in the resolution of BPPV.  I do not find any definite evidence of central nervous system dysfunction.  While it is possible this dysfunction  is secondary to an ischemic stroke or vestibular tumor after a long discussion Ms. Ow wanted to hold off on an MRI of her brain until she discusses it with her husband. I think this is appropriate given that I believe that the chance is a very small that the etiology is anything other than a peripheral vestibular problem.  If she has further problems she has been encouraged to contact our office. Of course if she begins to develop new neurologic symptoms an MRI of her brain as well as MRA of her head and neck would be prudent.  We will let her return for a visit on a prn basis.   Thank you for having me see her in consultation if you have any questions please feel free to contact me.   Lupita Raider Modesto Charon, MD [facility], [place]

## 2011-03-18 ENCOUNTER — Other Ambulatory Visit: Payer: Self-pay | Admitting: Internal Medicine

## 2011-03-18 DIAGNOSIS — Z1231 Encounter for screening mammogram for malignant neoplasm of breast: Secondary | ICD-10-CM

## 2011-03-30 ENCOUNTER — Telehealth: Payer: Self-pay | Admitting: Neurology

## 2011-03-30 NOTE — Telephone Encounter (Signed)
Pt would like to know which is her "bad side" in order to to the exercises given to her. She thinks it was her left side, but she isn't sure.

## 2011-03-31 NOTE — Telephone Encounter (Signed)
I believe the dysfunctional inner ear is the left one, if you could let her know.  Thx.

## 2011-03-31 NOTE — Telephone Encounter (Signed)
I let her know about her left ear, but she had some additional questions. She wants to know if they should try an actual physical therapist for the exercises first, and if so, is there one we can refer her to?

## 2011-04-01 NOTE — Telephone Encounter (Signed)
Referral to [facility] as we spoke about.

## 2011-04-15 ENCOUNTER — Ambulatory Visit
Admission: RE | Admit: 2011-04-15 | Discharge: 2011-04-15 | Disposition: A | Discharge status: Home / Self Care | Payer: Medicare Other | Source: Ambulatory Visit | Attending: Internal Medicine | Admitting: Internal Medicine

## 2011-04-15 DIAGNOSIS — Z1231 Encounter for screening mammogram for malignant neoplasm of breast: Secondary | ICD-10-CM

## 2011-05-02 ENCOUNTER — Other Ambulatory Visit: Payer: Self-pay | Admitting: Internal Medicine

## 2011-05-03 NOTE — Telephone Encounter (Signed)
Pls advise.  

## 2011-05-03 NOTE — Telephone Encounter (Signed)
Ok to refill until she gets her wellness visit in February if feeling fine (her last  TSH was almost a year ago )

## 2011-05-10 ENCOUNTER — Other Ambulatory Visit: Payer: Self-pay | Admitting: Emergency Medicine

## 2011-05-10 NOTE — Telephone Encounter (Signed)
RX sent to [facility].

## 2011-05-25 ENCOUNTER — Telehealth: Payer: Self-pay | Admitting: *Deleted

## 2011-05-25 NOTE — Telephone Encounter (Signed)
Appt made for pt for painful joints??  ?Spider bite??

## 2011-05-26 ENCOUNTER — Ambulatory Visit (INDEPENDENT_AMBULATORY_CARE_PROVIDER_SITE_OTHER): Payer: Medicare Other | Admitting: Internal Medicine

## 2011-05-26 ENCOUNTER — Encounter: Payer: Self-pay | Admitting: Internal Medicine

## 2011-05-26 VITALS — BP 140/100 | HR 88 | Temp 98.7°F | Wt 179.0 lb

## 2011-05-26 DIAGNOSIS — R202 Paresthesia of skin: Secondary | ICD-10-CM | POA: Insufficient documentation

## 2011-05-26 DIAGNOSIS — R209 Unspecified disturbances of skin sensation: Secondary | ICD-10-CM

## 2011-05-26 DIAGNOSIS — M5412 Radiculopathy, cervical region: Secondary | ICD-10-CM | POA: Insufficient documentation

## 2011-05-26 NOTE — Progress Notes (Signed)
Subjective:    Patient ID: Jasmine Neal, female    DOB: 1946/04/17, 65 y.o.   MRN: [id number]  HPI Patient comes in today for SDA  For acute problem evaluation.  onset about  3 weeks ago ; noted  tingling in  Index finger and then thought it was from a bruised on forearm then had pain down left arm and now alternates  Between tingling and pain.  No injury but had ben doing UE weights on treadmill.s  Also neck exercises as directed  No weakness. ? If had a bug bite No leg sx.   Dizziness vertigo a lot better relalted to left ear.  No head trauma   See consult Dr Modesto Charon Past Medical History  Diagnosis Date  . Chronic airway obstruction, not elsewhere classified   . Osteoarthritis   . Hypothyroidism   . Fibromyalgia   . Breast cancer     hx of right lumpectomy, 1990 IIA radiation cmfp ajunct rx  . Dysplasia     hx treated with cerv conization  . Abnormal GTT (glucose tolerance test)   . Fatty liver     US done 2010  . Asthma     evalutated by pulmonary severe by spirometry no smoker currently  . Hyperlipidemia     History   Social History  . Marital Status: Married    Spouse Name: N/A    Number of Children: N/A  . Years of Education: N/A   Occupational History  . Retired Runner, broadcasting/film/video    Social History Main Topics  . Smoking status: Former Smoker -- 0.8 packs/day for 10 years    Types: Cigarettes    Quit date: 07/11/1974  . Smokeless tobacco: Never Used  . Alcohol Use: Yes     1 glass of red wine nightly and socially  . Drug Use: Not on file  . Sexually Active: Not on file   Other Topics Concern  . Not on file   Social History Narrative   Retired Runner, broadcasting/film/video hh of 2 2 CatsQuit smoking 30 + years ago. 20 year pack year smoking hxRegular exercise- no     Past Surgical History  Procedure Date  . Nasal sinus surgery     on right done by Dr. Annalee Genta  . Breast lumpectomy     on right, 1990 IIA radiation cmfp ajuction rx    Family History  Problem Relation Age of  Onset  . Osteoporosis    . Thyroid disease    . Breast cancer Mother   . Osteoporosis Mother     no hip fracture  . Breast cancer Sister     x 3  . Lung cancer Sister   . Other Father     mysthenia gravis    Allergies  Allergen Reactions  . Hydrocodone Itching    Current Outpatient Prescriptions on File Prior to Visit  Medication Sig Dispense Refill  . acetaminophen (TYLENOL) 500 MG tablet Take 1,000 mg by mouth every 6 (six) hours as needed. pain       . albuterol (VENTOLIN HFA) 108 (90 BASE) MCG/ACT inhaler Inhale 2 puffs into the lungs every 6 (six) hours as needed. Shortness of breath and wheezing      . Cholecalciferol (VITAMIN D) 1000 UNITS capsule Take 1,000 Units by mouth daily.        . diphenhydramine-acetaminophen (TYLENOL PM) 25-500 MG TABS Take 1 tablet by mouth at bedtime as needed. sleep      . LEVOXYL 88 MCG  tablet TAKE 1 TABLET DAILY  90 tablet  0  . Multiple Vitamin (MULTIVITAMIN) tablet Take 1 tablet by mouth daily.        Marland Kitchen PARoxetine (PAXIL) 40 MG tablet TAKE 1 TABLET AT BEDTIME  90 tablet  0  . SYMBICORT 160-4.5 MCG/ACT inhaler INHALE 2 PUFFS TWICE A DAY  3 Inhaler  1    BP 140/100  Pulse 88  Temp(Src) 98.7 F (37.1 C) (Oral)  Wt 179 lb (81.194 kg)     Review of Systems No cp sob changing.  fever sweats ;lumps or bleeding had blood blister on toe  Asks about significance.     Objective:   Physical Exam WDWN in nad   HEENT  No acute changes  Neck no masses or tenderness midline. Chest:  Clear to A&P without wheezes rales or rhonchi CV:  S1-S2 no gallops or murmurs peripheral perfusion is normal No clubbing cyanosis or edema MS  Nl rom ue  No atrophy   Grip 5/5  No weakness .   DTRS 2+ throughout al 4 extremities nl gait . 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 antiinflammatory with caution  fo 10 days or so  Hx of breast cancer no alarm features  Monitor bp and fu  .

## 2011-05-26 NOTE — Patient Instructions (Signed)
This acts like a nerve irritation  Probably from the neck . Take 2 aleve twice a day for about 10 days. Altered  acitivity as dicussed .  If  persistent or progressive call and we should get    Referral for this.

## 2011-06-13 ENCOUNTER — Ambulatory Visit (INDEPENDENT_AMBULATORY_CARE_PROVIDER_SITE_OTHER): Payer: Medicare Other | Admitting: Internal Medicine

## 2011-06-13 ENCOUNTER — Encounter: Payer: Self-pay | Admitting: Internal Medicine

## 2011-06-13 VITALS — BP 172/94 | HR 78 | Wt 181.0 lb

## 2011-06-13 DIAGNOSIS — R42 Dizziness and giddiness: Secondary | ICD-10-CM

## 2011-06-13 DIAGNOSIS — B001 Herpesviral vesicular dermatitis: Secondary | ICD-10-CM | POA: Insufficient documentation

## 2011-06-13 DIAGNOSIS — M5412 Radiculopathy, cervical region: Secondary | ICD-10-CM

## 2011-06-13 DIAGNOSIS — R030 Elevated blood-pressure reading, without diagnosis of hypertension: Secondary | ICD-10-CM

## 2011-06-13 DIAGNOSIS — R202 Paresthesia of skin: Secondary | ICD-10-CM

## 2011-06-13 DIAGNOSIS — R209 Unspecified disturbances of skin sensation: Secondary | ICD-10-CM

## 2011-06-13 MED ORDER — PREDNISONE 10 MG PO TABS: 10.0000 mg | ORAL_TABLET | Freq: Every day | ORAL | Status: AC

## 2011-06-13 MED ORDER — VALACYCLOVIR HCL 1 G PO TABS: ORAL_TABLET | ORAL | Status: DC

## 2011-06-13 NOTE — Patient Instructions (Signed)
Check BP readings  To make sure not elevated  All the time.  Take prednisone.   For probably pinched nerve  Call either way in about 2 weeks a bout status and we can do referral.  At that time if not better  . Also see the status of the dizziness at that time.

## 2011-06-13 NOTE — Progress Notes (Signed)
  Subjective:    Patient ID: Jasmine Neal, female    DOB: [birth year], 65 y.o.   MRN: [id number]  HPI Patient comes in today for SDA  For acute problem evaluation. For follow up of arm pain   Which is better and  numbness that is not better . Since last visit No pain   ;  Aleve  For 18 days  Still tingling and no weakness .  No neck pain with this. At present.  Still feels a little offf from the vertigo episode  Although much better after PT wonders if needs to go back. No falling . No cp sob  Bp has been up  sometimes borderline  But  Not like this. No decongestants excess etoh    Review of Systems No fever eye change falling of syncope  Past history family history social history reviewed in the electronic medical record.     Objective:   Physical Exam WDWN in nad Chest:  Clear to A&P without wheezes rales or rhonchi CV:  S1-S2 no gallops or murmurs peripheral perfusion is normal  BP right 140/70 left 144/80  Looks well  UE no atrophy dtrs present grip nl  Pulses  Nl cap refill . Gait ok normal grossly      Assessment & Plan:  Left arm tingling seems radicular and now pain is gone no other alarm feelings .  Disc options  Will do pred course and call in  2 weeks if not better then plan referral prob to neurology    Elevated BP readings very high by nurse but  Is just slightly up with me . however need more data points at home  Pt reassures me that bp usually ok but will check this   Vertigo better but just off would wait and see how arm tingling does first   Consider recheck  Ok to refil valtrex for prn cold sores

## 2011-06-13 NOTE — Assessment & Plan Note (Signed)
Had vestibular maneuver   And helped has some residual imbalance

## 2011-06-13 NOTE — Assessment & Plan Note (Signed)
Pain is gone  Tingling continues without weakness

## 2011-07-31 ENCOUNTER — Other Ambulatory Visit: Payer: Self-pay | Admitting: Internal Medicine

## 2011-08-01 NOTE — Telephone Encounter (Signed)
Pt last seen 06/13/11 and pt has cpx scheduled for 08/26/11.  Pls advise.

## 2011-08-01 NOTE — Telephone Encounter (Signed)
Ok to refill each for 3 months worth . Please arrange for this  thanks

## 2011-08-26 ENCOUNTER — Encounter: Payer: Self-pay | Admitting: Internal Medicine

## 2011-08-26 ENCOUNTER — Ambulatory Visit (INDEPENDENT_AMBULATORY_CARE_PROVIDER_SITE_OTHER): Payer: Medicare Other | Admitting: Internal Medicine

## 2011-08-26 VITALS — BP 130/80 | HR 84 | Ht 69.0 in | Wt 177.0 lb

## 2011-08-26 DIAGNOSIS — E785 Hyperlipidemia, unspecified: Secondary | ICD-10-CM

## 2011-08-26 DIAGNOSIS — M1990 Unspecified osteoarthritis, unspecified site: Secondary | ICD-10-CM

## 2011-08-26 DIAGNOSIS — Z0000 Encounter for general adult medical examination without abnormal findings: Secondary | ICD-10-CM

## 2011-08-26 DIAGNOSIS — R269 Unspecified abnormalities of gait and mobility: Secondary | ICD-10-CM

## 2011-08-26 DIAGNOSIS — R2689 Other abnormalities of gait and mobility: Secondary | ICD-10-CM

## 2011-08-26 DIAGNOSIS — E039 Hypothyroidism, unspecified: Secondary | ICD-10-CM

## 2011-08-26 DIAGNOSIS — J4489 Other specified chronic obstructive pulmonary disease: Secondary | ICD-10-CM

## 2011-08-26 DIAGNOSIS — M949 Disorder of cartilage, unspecified: Secondary | ICD-10-CM

## 2011-08-26 DIAGNOSIS — M899 Disorder of bone, unspecified: Secondary | ICD-10-CM

## 2011-08-26 DIAGNOSIS — R42 Dizziness and giddiness: Secondary | ICD-10-CM

## 2011-08-26 DIAGNOSIS — J449 Chronic obstructive pulmonary disease, unspecified: Secondary | ICD-10-CM

## 2011-08-26 DIAGNOSIS — Z853 Personal history of malignant neoplasm of breast: Secondary | ICD-10-CM

## 2011-08-26 LAB — CBC WITH DIFFERENTIAL/PLATELET
Eosinophils Relative: 4.2 % (ref 0.0–5.0)
HCT: 42.5 % (ref 36.0–46.0)
Hemoglobin: 14 g/dL (ref 12.0–15.0)
Lymphs Abs: 1.4 10*3/uL (ref 0.7–4.0)
MCV: 92.8 fl (ref 78.0–100.0)
Monocytes Absolute: 0.5 10*3/uL (ref 0.1–1.0)
Monocytes Relative: 8.5 % (ref 3.0–12.0)
Neutro Abs: 3.4 10*3/uL (ref 1.4–7.7)
Platelets: 166 10*3/uL (ref 150.0–400.0)
RDW: 13.8 % (ref 11.5–14.6)
WBC: 5.6 10*3/uL (ref 4.5–10.5)

## 2011-08-26 LAB — LIPID PANEL
Cholesterol: 215 mg/dL — ABNORMAL HIGH (ref 0–200)
Total CHOL/HDL Ratio: 3
VLDL: 14.8 mg/dL (ref 0.0–40.0)

## 2011-08-26 LAB — HEPATIC FUNCTION PANEL
ALT: 27 U/L (ref 0–35)
AST: 31 U/L (ref 0–37)
Bilirubin, Direct: 0 mg/dL (ref 0.0–0.3)
Total Bilirubin: 0.4 mg/dL (ref 0.3–1.2)

## 2011-08-26 LAB — BASIC METABOLIC PANEL
Chloride: 108 mEq/L (ref 96–112)
GFR: 50.57 mL/min — ABNORMAL LOW (ref >60.00)
Potassium: 4.8 mEq/L (ref 3.5–5.1)
Sodium: 143 mEq/L (ref 135–145)

## 2011-08-26 NOTE — Patient Instructions (Addendum)
Will notify you  of labs when available. Continue lifestyle intervention healthy eating and exercise . No change in meds at this time. follow up depending on labs and how your are doing   Check with Dr Modesto Charon about the imbalance but could be residual of the vertigo

## 2011-08-26 NOTE — Progress Notes (Signed)
Subjective:    Patient ID: Jasmine Neal, female    DOB: 1945/10/13, 66 y.o.   MRN: [id number]  HPI Patient comes in today for preventive visit and follow-up of medical issues. Update of her history since her last visit. Some fatigue  But sleeps ok and  No OSA no bleeding  Hx  pf breast cancer  yearlymammos Left arm and balance tloing better after    Predn. Balance   Still a bit off . ? If should go back to PT. Skin cancer check  :Myriam Forehand when chair broken nd had upper teeth area numb  No looseness or pain now  Will see dentist . She is not concerned. COPD asthma: taking inhalers  Not enthralled with seeing pulmonary ? Other options or other eval. Doing well at this point . Per pt.  Mood: doing well on meds THyroid : taking med daily no problem .    Hearing:  Ok some decrase  Vision:  No limitations at present .  Safety:  Has smoke detector and wears seat belts.  No firearms. No excess sun exposure. Sees dentist regularly.  Falls: see above  Advance directive :  Reviewed   Memory: Felt to be good  , no concern from her or her family.  Depression: No anhedonia unusual crying or depressive symptoms  Nutrition: Eats well balanced diet; adequate calcium and vitamin D. No swallowing chewiing problems.  Injury: no major injuries in the last six months.  Other healthcare providers:  Reviewed today .  Social:  Lives with husband married.    Preventive parameters: up-to-date on colonoscopy, mammogram, immunizations. Including Tdap and pneumovax.  ADLS:   There are no problems or need for assistance  driving, feeding, obtaining food, dressing, toileting and bathing, managing money using phone. She is independent.   Review of Systems ROS:  GEN/ HEENT: No fever, significant weight changes sweats headaches vision problems new  hearing changes, CV/ PULM; No chest pain shortness of breath cough, syncope,edema  change in exercise tolerance. GI /GU: No adominal pain, vomiting,  change in bowel habits. No blood in the stool. No significant GU symptoms. SKIN/HEME: ,no acute skin rashes suspicious lesions or bleeding. No lymphadenopathy, nodules, masses.  NEURO/ PSYCH:  No neurologic signs such as weakness numbness. No depression anxiety. IMM/ Allergy: No unusual infections.  Allergy .   Snores some no OSA.    REST of 12 system review negative except as per HPI  Past Medical History  Diagnosis Date  . Chronic airway obstruction, not elsewhere classified   . Osteoarthritis   . Hypothyroidism   . Fibromyalgia   . Breast cancer     hx of right lumpectomy, 1990 IIA radiation cmfp ajunct rx  . Dysplasia     hx treated with cerv conization  . Abnormal GTT (glucose tolerance test)   . Fatty liver     US done 2010  . Asthma     evalutated by pulmonary severe by spirometry no smoker currently  . Hyperlipidemia     History   Social History  . Marital Status: Married    Spouse Name: N/A    Number of Children: N/A  . Years of Education: N/A   Occupational History  . Retired Runner, broadcasting/film/video    Social History Main Topics  . Smoking status: Former Smoker -- 0.8 packs/day for 10 years    Types: Cigarettes    Quit date: 07/11/1974  . Smokeless tobacco: Never Used  . Alcohol Use:  Yes     1 glass of red wine nightly and socially  . Drug Use: Not on file  . Sexually Active: Not on file   Other Topics Concern  . Not on file   Social History Narrative   Retired Runner, broadcasting/film/video hh of 2  Quit smoking 30 + years ago. 20 year pack year smoking hxRegular exercise- some  treadmill     Past Surgical History  Procedure Date  . Nasal sinus surgery     on right done by Dr. Annalee Genta  . Breast lumpectomy     on right, 1990 IIA radiation cmfp ajuction rx    Family History  Problem Relation Age of Onset  . Osteoporosis    . Thyroid disease    . Breast cancer Mother   . Osteoporosis Mother     no hip fracture  . Breast cancer Sister     x 3  . Lung cancer Sister   . Other  Father     mysthenia gravis    Allergies  Allergen Reactions  . Hydrocodone Itching    Current Outpatient Prescriptions on File Prior to Visit  Medication Sig Dispense Refill  . acetaminophen (TYLENOL) 500 MG tablet Take 1,000 mg by mouth every 6 (six) hours as needed. pain       . albuterol (VENTOLIN HFA) 108 (90 BASE) MCG/ACT inhaler Inhale 2 puffs into the lungs every 6 (six) hours as needed. Shortness of breath and wheezing      . Cholecalciferol (VITAMIN D) 1000 UNITS capsule Take 1,000 Units by mouth daily.        . diphenhydramine-acetaminophen (TYLENOL PM) 25-500 MG TABS Take 1 tablet by mouth at bedtime as needed. sleep      . LEVOXYL 88 MCG tablet TAKE 1 TABLET DAILY  90 tablet  0  . PARoxetine (PAXIL) 40 MG tablet TAKE 1 TABLET AT BEDTIME  90 tablet  0  . SYMBICORT 160-4.5 MCG/ACT inhaler INHALE 2 PUFFS TWICE A DAY  3 Inhaler  1  . valACYclovir (VALTREX) 1000 MG tablet Take 2 po bid as directed  30 tablet  1    BP 130/80  Pulse 84  Ht 5\' 9"  (1.753 m)  Wt 177 lb (80.287 kg)  BMI 26.14 kg/m2       Objective:   Physical Exam Physical Exam: Vital signs reviewed NWG:NFAO is a well-developed well-nourished alert cooperative  white female who appears her stated age in no acute distress.  HEENT: normocephalic atraumatic , Eyes: PERRL EOM's full, conjunctiva clear, Nares: paten,t no deformity discharge or tenderness., Ears: no deformity EAC's clear TMs with normal landmarks. Mouth: clear OP, no lesions, edema.  Moist mucous membranes. Dentition in adequate repair. NECK: supple without masses,  or bruits. CHEST/PULM:  Clear to auscultation and percussion breath sounds equal no wheeze , rales or rhonchi. No chest wall deformities or tenderness. CV: PMI is nondisplaced, S1 S2 no gallops, murmurs, rubs. Peripheral pulses are full without delay.No JVD .  BREAST : right distorted no tenderness or  Lumps  Left nl to palp and inspection ABDOMEN: Bowel sounds normal nontender  No  guard or rebound, no hepato splenomegal no CVA tenderness.  No hernia. Extremtities:  No clubbing cyanosis or edema, no acute joint swelling or redness no focal atrophy NEURO:  Oriented x3, cranial nerves 3-12 appear to be intact, no obvious focal weakness,gait within normal limits no abnormal reflexes or asymmetrical SKIN: No acute rashes normal turgor, color, no bruising or petechiae. Wynelle Link  changes  Noted  PSYCH: Oriented, good eye contact, no obvious depression anxiety, cognition and judgment appear normal. LN: no cervical axillary inguinal adenopathy         Assessment & Plan:  Preventive Health Care Counseled regarding healthy nutrition, exercise, sleep, injury prevention, calcium vit d and healthy weight . COPD vs asthma   Stable at present.  Will look at    Status. Per pt request  Thyroid     No change nges  Hx of breast cancer  Left arm  prob from neck exercise no residual  balance a lit better ok to go back to PT but no extension exercises. Mood stable stay on meds  [facility]   Will check on last dexa has sig risk hx with thyroid and breast cancer dx.   Reviewe last dexa in record was 2006  Will advise repeat dexa.

## 2011-08-27 ENCOUNTER — Encounter: Payer: Self-pay | Admitting: Internal Medicine

## 2011-08-27 DIAGNOSIS — Z0000 Encounter for general adult medical examination without abnormal findings: Secondary | ICD-10-CM | POA: Insufficient documentation

## 2011-08-29 ENCOUNTER — Telehealth: Payer: Self-pay | Admitting: *Deleted

## 2011-08-29 DIAGNOSIS — M949 Disorder of cartilage, unspecified: Secondary | ICD-10-CM

## 2011-08-29 NOTE — Telephone Encounter (Signed)
Message copied by Romualdo Bolk on Mon Aug 29, 2011  8:29 AM ------      Message from: [facility]      Created: Sat Aug 27, 2011  9:48 AM       Please notify patient that she is due for a dexa scan             Please have this scheduled  When convenient.            Also looked a the record would wtill like her to see pulmonary  As she was on Oxygen in the past year or 2 .            She Can  call to  request to change providers but   Advise still FU with them for now.Marland Kitchen

## 2011-08-29 NOTE — Telephone Encounter (Signed)
Left message to call back  

## 2011-08-30 NOTE — Telephone Encounter (Signed)
Pt aware of this. Pt is having coughing up yellow green stuff and congestion. No fever. No sob or wheezing. I advise to take Mucinex since this sounds more viral. Pt states that this just started on Friday. I told her that we would be happy to see her. But unless she starts running a fever, having sob or wheezing there is not a whole lot we can do. Pt aware and will call back if she gets worse.

## 2011-08-30 NOTE — Telephone Encounter (Signed)
Check with her before weekend about how she is doing .

## 2011-08-31 NOTE — Telephone Encounter (Signed)
Spoke with pt and is sounding much better today. She is also feeling much better. She states that she took mucinex and slept. So that helped a lot.

## 2011-08-31 NOTE — Progress Notes (Signed)
Quick Note:  Pt aware of results. ______ 

## 2011-09-12 ENCOUNTER — Ambulatory Visit: Payer: Medicare Other | Attending: Internal Medicine

## 2011-09-27 ENCOUNTER — Ambulatory Visit (INDEPENDENT_AMBULATORY_CARE_PROVIDER_SITE_OTHER): Payer: Medicare Other | Admitting: Emergency Medicine

## 2011-09-27 ENCOUNTER — Encounter: Payer: Self-pay | Admitting: Emergency Medicine

## 2011-09-27 VITALS — BP 118/78 | HR 86 | Temp 98.3°F | Ht 69.5 in | Wt 179.2 lb

## 2011-09-27 DIAGNOSIS — J449 Chronic obstructive pulmonary disease, unspecified: Secondary | ICD-10-CM

## 2011-09-27 MED ORDER — BUDESONIDE-FORMOTEROL FUMARATE 160-4.5 MCG/ACT IN AERO: 2.0000 | INHALATION_SPRAY | Freq: Two times a day (BID) | RESPIRATORY_TRACT | Status: DC

## 2011-09-27 NOTE — Assessment & Plan Note (Signed)
-   continue symbicort; she is going to try top wean it off. I doubt she will tolerate well.  - rov 1 year with CXR

## 2011-09-27 NOTE — Patient Instructions (Signed)
Continue your Symbicort  Follow with Dr Delton Coombes in 1 year or sooner if you have any problems

## 2011-09-27 NOTE — Progress Notes (Signed)
  Subjective:    Patient ID: Jasmine Neal, female    DOB: February 15, 1946, 66 y.o.   MRN: [id number]  HPI 66 yo woman with hx of COPD, allergic rhinitis, sinusitis, remote breast CA (chemo + XRT).   ROV 67/21/11 -- COPD. Last time we tried to assess degree of disease by ordering PFT and enacting trial off meds (Symbicort + Spiriva). She stopped the meds 4 weeks ago, had PFT today. Over last 2 weeks has much more DOE, unable to walk room to room. Increased Ventolin use.   ROV 06/11/10 -- restarted Spiriva and Symbicort; feeling much better, Ventolin use has decreased significantly. Feels back to her baseline. Last time walked and was hypoxemic. Plan was to repeat today to see if she still needed when on meds.   ROV 11/08/10 -- moderately severe COPD, allergic rhinitis. Had her L knee SGY under a local block instead of general anaesthesia. Taking Spiriva and Symbicort reliably. Unable to use her O2 as well as in the past due to her knee, worried about getting tangled up in the tubing. She is hypothyroid, wonders about whether this contributes to her dyspnea. In general feels at baseline. Wants to walk today to assess oxygenation.   ROV 02/09/11 --  moderately severe COPD, allergic rhinitis. Last time her walking oximetry showed no desats. We discussed d/c O2, but have not done so yet. She stopped Spiriva since last visit, doesn't seem to miss it. She remians on Symbicort.   ROV 09/27/11 -- moderately severe COPD, allergic rhinitis. She dropped Spiriva and tolerated it.  Remains on Symbicort but sometimes uses only in the am. Hasn't had any problems since last time.   Review of Systems As per HPI      Objective:   Physical Exam Gen: Pleasant, well-nourished, in no distress,  normal affect  ENT: No lesions, no postnasal drip  Lungs: No use of accessory muscles, no dullness to percussion, clear without rales or rhonchi  Cardiovascular: RRR, heart sounds normal, no murmur or gallops, no peripheral  edema  Musculoskeletal: No deformities, no cyanosis or clubbing  Neuro: alert, non focal  Skin: Warm, no lesions or rashes     Assessment & Plan:  COPD - continue symbicort; she is going to try top wean it off. I doubt she will tolerate well.  - rov 1 year with CXR

## 2011-10-15 ENCOUNTER — Other Ambulatory Visit: Payer: Self-pay | Admitting: Internal Medicine

## 2011-10-18 NOTE — Telephone Encounter (Signed)
Rx last filled 07/31/11.  Pt last seen 09/27/11.  Pls advise.

## 2011-10-18 NOTE — Telephone Encounter (Signed)
Refill through March of next year.  2014

## 2011-11-21 ENCOUNTER — Telehealth: Payer: Self-pay | Admitting: Neurology

## 2011-11-21 NOTE — Telephone Encounter (Signed)
Pt was previously referred to Dr. Beryle Flock at [facility] for her vertigo. Problem is almost completely taken care of, but Dr. Einar Pheasant would like to see her one more time. He needs another referral sent to his office authorizing her visit. His fax # is [phone].

## 2011-11-25 ENCOUNTER — Other Ambulatory Visit: Payer: Self-pay | Admitting: Neurology

## 2011-11-25 DIAGNOSIS — R42 Dizziness and giddiness: Secondary | ICD-10-CM

## 2011-11-25 NOTE — Telephone Encounter (Signed)
Faxed PT referral as requested. OK per Dr. Modesto Charon.

## 2011-12-12 ENCOUNTER — Telehealth: Payer: Self-pay | Admitting: Internal Medicine

## 2011-12-12 MED ORDER — LEVOTHYROXINE SODIUM 88 MCG PO TABS: 88.0000 ug | ORAL_TABLET | Freq: Every day | ORAL | Status: DC

## 2011-12-12 NOTE — Telephone Encounter (Signed)
Pls advise.  

## 2011-12-12 NOTE — Telephone Encounter (Signed)
Change to synthroid  rx  Until   Due for  For yearly visit and labs in February. 2014

## 2011-12-12 NOTE — Telephone Encounter (Signed)
Addended by: Azucena Freed on: 12/12/2011 01:29 PM   Modules accepted: Orders

## 2011-12-12 NOTE — Telephone Encounter (Signed)
New rx for synthroid 88 mcg sent to [facility].

## 2011-12-12 NOTE — Telephone Encounter (Signed)
Patient called stating that she received a letter from [facility] that her levoxyl will not be available until 2014 and she will need an rx for an alternative. Please assist.

## 2012-01-03 ENCOUNTER — Ambulatory Visit
Admission: RE | Admit: 2012-01-03 | Discharge: 2012-01-03 | Disposition: A | Discharge status: Home / Self Care | Payer: Medicare Other | Source: Ambulatory Visit

## 2012-01-03 DIAGNOSIS — M899 Disorder of bone, unspecified: Secondary | ICD-10-CM

## 2012-04-09 ENCOUNTER — Other Ambulatory Visit: Payer: Self-pay | Admitting: Internal Medicine

## 2012-04-09 DIAGNOSIS — Z1231 Encounter for screening mammogram for malignant neoplasm of breast: Secondary | ICD-10-CM

## 2012-04-09 DIAGNOSIS — Z853 Personal history of malignant neoplasm of breast: Secondary | ICD-10-CM

## 2012-04-09 DIAGNOSIS — Z9889 Other specified postprocedural states: Secondary | ICD-10-CM

## 2012-04-18 ENCOUNTER — Ambulatory Visit
Admission: RE | Admit: 2012-04-18 | Discharge: 2012-04-18 | Disposition: A | Discharge status: Home / Self Care | Payer: Medicare Other | Source: Ambulatory Visit | Attending: Internal Medicine | Admitting: Internal Medicine

## 2012-04-18 DIAGNOSIS — Z853 Personal history of malignant neoplasm of breast: Secondary | ICD-10-CM

## 2012-04-18 DIAGNOSIS — Z1231 Encounter for screening mammogram for malignant neoplasm of breast: Secondary | ICD-10-CM

## 2012-04-18 DIAGNOSIS — Z9889 Other specified postprocedural states: Secondary | ICD-10-CM

## 2012-05-24 ENCOUNTER — Encounter: Payer: Self-pay | Admitting: Internal Medicine

## 2012-05-30 ENCOUNTER — Other Ambulatory Visit: Payer: Self-pay | Admitting: Dermatology

## 2012-06-29 ENCOUNTER — Other Ambulatory Visit: Payer: Self-pay | Admitting: Internal Medicine

## 2012-06-29 MED ORDER — PAROXETINE HCL 40 MG PO TABS: ORAL_TABLET | ORAL | Status: DC

## 2012-06-29 MED ORDER — LEVOTHYROXINE SODIUM 88 MCG PO TABS: 88.0000 ug | ORAL_TABLET | Freq: Every day | ORAL | Status: DC

## 2012-06-29 NOTE — Telephone Encounter (Signed)
Pt needs refill of: LEVOXYL 88 MCG tablet     (90 day supply) PARoxetine (PAXIL) 40 MG tablet   (90 supply) Pt has new Network engineer [employer] Care:  Health Plan # [phone]  [phone]  Member Id [id number]  Group No [id number] Pharm:  [facility]  [address] [place] [address]

## 2012-06-29 NOTE — Telephone Encounter (Signed)
Sent to [facility] by e-scribe. 

## 2012-07-10 ENCOUNTER — Telehealth: Payer: Self-pay | Admitting: Internal Medicine

## 2012-07-10 NOTE — Telephone Encounter (Signed)
We changed pt pharm, but it does not take effect until 07/11/12. [facility] needs [place] to resend. [facility] said to  call [phone] to expedite things. PARoxetine (PAXIL) 40 MG tablet levothyroxine (LEVOXYL) 88 MCG tablet  Order no: [id number] Acct no: [id number] Pt is now out of meds because they could not fill until 07/11/12. Thank you!!

## 2012-07-12 ENCOUNTER — Other Ambulatory Visit: Payer: Self-pay | Admitting: Family Medicine

## 2012-07-12 MED ORDER — PAROXETINE HCL 40 MG PO TABS: ORAL_TABLET | ORAL | Status: DC

## 2012-07-12 MED ORDER — LEVOTHYROXINE SODIUM 88 MCG PO TABS: 88.0000 ug | ORAL_TABLET | Freq: Every day | ORAL | Status: DC

## 2012-07-12 NOTE — Telephone Encounter (Signed)
Sent to [facility] by e-scribe. 

## 2012-07-17 ENCOUNTER — Ambulatory Visit (INDEPENDENT_AMBULATORY_CARE_PROVIDER_SITE_OTHER)
Admission: RE | Admit: 2012-07-17 | Discharge: 2012-07-17 | Disposition: A | Discharge status: Home / Self Care | Payer: Medicare Other | Source: Ambulatory Visit | Attending: Internal Medicine | Admitting: Internal Medicine

## 2012-07-17 DIAGNOSIS — M949 Disorder of cartilage, unspecified: Secondary | ICD-10-CM

## 2012-09-11 ENCOUNTER — Ambulatory Visit (INDEPENDENT_AMBULATORY_CARE_PROVIDER_SITE_OTHER): Payer: PRIVATE HEALTH INSURANCE | Admitting: Internal Medicine

## 2012-09-11 ENCOUNTER — Encounter: Payer: Self-pay | Admitting: Internal Medicine

## 2012-09-11 VITALS — BP 154/90 | HR 82 | Temp 98.6°F | Ht 68.75 in | Wt 178.0 lb

## 2012-09-11 DIAGNOSIS — E785 Hyperlipidemia, unspecified: Secondary | ICD-10-CM

## 2012-09-11 DIAGNOSIS — M1990 Unspecified osteoarthritis, unspecified site: Secondary | ICD-10-CM

## 2012-09-11 DIAGNOSIS — Z1159 Encounter for screening for other viral diseases: Secondary | ICD-10-CM

## 2012-09-11 DIAGNOSIS — Z853 Personal history of malignant neoplasm of breast: Secondary | ICD-10-CM

## 2012-09-11 DIAGNOSIS — Z23 Encounter for immunization: Secondary | ICD-10-CM

## 2012-09-11 DIAGNOSIS — B009 Herpesviral infection, unspecified: Secondary | ICD-10-CM

## 2012-09-11 DIAGNOSIS — E039 Hypothyroidism, unspecified: Secondary | ICD-10-CM

## 2012-09-11 DIAGNOSIS — Z85828 Personal history of other malignant neoplasm of skin: Secondary | ICD-10-CM

## 2012-09-11 DIAGNOSIS — Z0000 Encounter for general adult medical examination without abnormal findings: Secondary | ICD-10-CM

## 2012-09-11 DIAGNOSIS — Z79899 Other long term (current) drug therapy: Secondary | ICD-10-CM

## 2012-09-11 DIAGNOSIS — B001 Herpesviral vesicular dermatitis: Secondary | ICD-10-CM

## 2012-09-11 DIAGNOSIS — J449 Chronic obstructive pulmonary disease, unspecified: Secondary | ICD-10-CM

## 2012-09-11 DIAGNOSIS — Z87891 Personal history of nicotine dependence: Secondary | ICD-10-CM

## 2012-09-11 LAB — CBC WITH DIFFERENTIAL/PLATELET
Basophils Relative: 0.7 % (ref 0.0–3.0)
Eosinophils Relative: 4.9 % (ref 0.0–5.0)
HCT: 42.3 % (ref 36.0–46.0)
Hemoglobin: 14.1 g/dL (ref 12.0–15.0)
Lymphocytes Relative: 28.9 % (ref 12.0–46.0)
Lymphs Abs: 1.5 10*3/uL (ref 0.7–4.0)
Monocytes Relative: 6.6 % (ref 3.0–12.0)
Neutro Abs: 3.1 10*3/uL (ref 1.4–7.7)
RBC: 4.68 Mil/uL (ref 3.87–5.11)
WBC: 5.2 10*3/uL (ref 4.5–10.5)

## 2012-09-11 LAB — HEPATIC FUNCTION PANEL
ALT: 27 U/L (ref 0–35)
AST: 29 U/L (ref 0–37)
Albumin: 4.1 g/dL (ref 3.5–5.2)
Alkaline Phosphatase: 72 U/L (ref 39–117)

## 2012-09-11 LAB — POCT URINALYSIS DIP (MANUAL ENTRY)
Glucose, UA: NEGATIVE
Ketones, POC UA: NEGATIVE
Leukocytes, UA: NEGATIVE
Protein Ur, POC: NEGATIVE
Spec Grav, UA: 1.015
Urobilinogen, UA: 0.2

## 2012-09-11 LAB — BASIC METABOLIC PANEL
Calcium: 9.6 mg/dL (ref 8.4–10.5)
GFR: 73.73 mL/min (ref >60.00)
Glucose, Bld: 87 mg/dL (ref 70–99)
Potassium: 3.9 mEq/L (ref 3.5–5.1)
Sodium: 139 mEq/L (ref 135–145)

## 2012-09-11 LAB — LIPID PANEL
Cholesterol: 203 mg/dL — ABNORMAL HIGH (ref 0–200)
VLDL: 16.8 mg/dL (ref 0.0–40.0)

## 2012-09-11 NOTE — Progress Notes (Signed)
Chief Complaint  Patient presents with  . Annual Exam    Medicare    HPI: Patient comes in today for Preventive Medicare wellness visit . No major injuries, ed visits ,hospitalizations , new medications since last visit.   Had [facility] nurse come to house and talk about healthy life style.   COPD : Using symbicort q d in am   .    Still sob up hill etc.  ? No progression  Thyroid Now on brand synthroid for 2 months or so   Skin cancer: amy [place].     ssca   mid neck  Removed  Yearly check   oa not taking nsaids   Mood: on paxil since breast cancer dc in late 40s   Does well ? If should stay on this    Hearing:  Ok some loss  Stable   Vision:  No limitations at present . Last eye check UTD  Gould early cataracts.   Safety:  Has smoke detector and wears seat belts.  No firearms. No excess sun exposure. Sees dentist regularly.  Falls:   No falls   Advance directive :  Reviewed  .  Memory: Felt to be good  , no concern from her or her family.  Depression: No anhedonia unusual crying or depressive symptoms  Nutrition: Eats well balanced diet; adequate calcium and vitamin D. No swallowing chewing problems.  Injury: no major injuries in the last six months.  Other healthcare providers:  Reviewed today .  Social:  Lives with spouse married. No pets.   Preventive parameters: up-to-date  Reviewed   ADLS:   There are no problems or need for assistance  driving, feeding, obtaining food, dressing, toileting and bathing, managing money using phone. She is independent.  EXERCISE/ HABITS  Per week   No tobacco    etoh 1 red wine or more 2-3 cups per day   n sugar.  Joined curves silver sneakers .   ROS:  GEN/ HEENT: No fever, significant weight changes sweats headaches vision problems hearing changes, CV/ PULM; No chest pain shortness of breath cough, syncope,edema  change in exercise tolerance. GI /GU: No adominal pain, vomiting, change in bowel habits. No blood in the stool. No  significant GU symptoms. dooes have some frequency at times and doing kegels SKIN/HEME: ,no acute skin rashes suspicious lesions or bleeding. No lymphadenopathy, nodules, masses.  NEURO/ PSYCH:  No neurologic signs such as weakness numbness. No depression anxiety. IMM/ Allergy: No unusual infections.  Allergy .   REST of 12 system review negative except as per HPI   Past Medical History  Diagnosis Date  . Chronic airway obstruction, not elsewhere classified   . Osteoarthritis   . Hypothyroidism   . Fibromyalgia   . Breast cancer     hx of right lumpectomy, 1990 IIA radiation cmfp ajunct rx  . Dysplasia     hx treated with cerv conization  . Abnormal GTT (glucose tolerance test)   . Fatty liver     US done 2010  . Asthma     evalutated by pulmonary severe by spirometry no smoker currently  . Hyperlipidemia   . Hx of nonmelanoma skin cancer      followed dr [place]     Family History  Problem Relation Age of Onset  . Osteoporosis    . Thyroid disease    . Breast cancer Mother   . Osteoporosis Mother     no hip fracture  . Breast  cancer Sister     x 3  . Lung cancer Sister   . Other Father     mysthenia gravis    History   Social History  . Marital Status: Married    Spouse Name: N/A    Number of Children: N/A  . Years of Education: N/A   Occupational History  . Retired Runner, broadcasting/film/video    Social History Main Topics  . Smoking status: Former Smoker -- 0.80 packs/day for 10 years    Types: Cigarettes    Quit date: 07/11/1974  . Smokeless tobacco: Never Used  . Alcohol Use: Yes     Comment: 1 glass of red wine nightly and socially  . Drug Use: None  . Sexually Active: None   Other Topics Concern  . None   Social History Narrative   Retired Runner, broadcasting/film/video    hh of 2     Quit smoking 30 + years ago. 20 year pack year smoking hx   Regular exercise- some  treadmill    8 hours sleep   No current pets     Outpatient Encounter Prescriptions as of 09/11/2012  Medication Sig  Dispense Refill  . acetaminophen (TYLENOL) 500 MG tablet Take 1,000 mg by mouth every 6 (six) hours as needed. pain       . albuterol (VENTOLIN HFA) 108 (90 BASE) MCG/ACT inhaler Inhale 2 puffs into the lungs every 6 (six) hours as needed. Shortness of breath and wheezing      . budesonide-formoterol (SYMBICORT) 160-4.5 MCG/ACT inhaler Inhale 2 puffs into the lungs 2 (two) times daily.  3 Inhaler  3  . Cholecalciferol (VITAMIN D) 1000 UNITS capsule Take 1,000 Units by mouth daily.        . diphenhydramine-acetaminophen (TYLENOL PM) 25-500 MG TABS Take 1 tablet by mouth at bedtime as needed. sleep      . levothyroxine (LEVOXYL) 88 MCG tablet Take 1 tablet (88 mcg total) by mouth daily.  90 tablet  0  . levothyroxine (SYNTHROID, LEVOTHROID) 88 MCG tablet Take 88 mcg by mouth daily. Brand name only      . PARoxetine (PAXIL) 40 MG tablet TAKE 1 TABLET AT BEDTIME  90 tablet  0  . [DISCONTINUED] levothyroxine (SYNTHROID, LEVOTHROID) 88 MCG tablet Take 1 tablet (88 mcg total) by mouth daily.  90 tablet  1   No facility-administered encounter medications on file as of 09/11/2012.   Family hx  Sister lung cancer  Father breathing issues and myasthenia and grav.   And gm ied of pulmoary issue   EXAM:  BP 154/90  Pulse 82  Temp(Src) 98.6 F (37 C) (Oral)  Ht 5' 8.75" (1.746 m)  Wt 178 lb (80.74 kg)  BMI 26.49 kg/m2  SpO2 97%  Body mass index is 26.49 kg/(m^2).  Physical Exam: Vital signs reviewed AVW:UJWJ is a well-developed well-nourished alert cooperative  female who appears stated age in no acute distress.  HEENT: normocephalic atraumatic , Eyes: PERRL EOM's full, conjunctiva clear, glasses Nares: paten,t no deformity discharge or tenderness., Ears: no deformity EAC's clear TMs with normal landmarks. Mouth: clear OP, no lesions, edema.  Moist mucous membranes. Dentition in adequate repair. NECK: supple without masses, thyromegaly or bruits. CHEST/PULM:  Clear to auscultation and percussion  breath sounds equal no wheeze , rales or rhonchi. No chest wall deformities or tenderness. Breast raqd changes right no lesions left no masses or adenopathy  CV: PMI is nondisplaced, S1 S2 no gallops, murmurs, rubs. Peripheral pulses are full  without delay.No JVD .  ABDOMEN: Bowel sounds normal nontender  No guard or rebound, no hepato splenomegal no CVA tenderness.  No hernia. Extremtities:  No clubbing cyanosis or edema, no acute joint swelling or redness no focal atrophy  oa changes dip pinky fingers   NEURO:  Oriented x3, cranial nerves 3-12 appear to be intact, no obvious focal weakness,gait within normal limits no abnormal reflexes or asymmetrical SKIN: No acute rashes normal turgor, color, no bruising or petechiae. PSYCH: Oriented, good eye contact, no obvious depression anxiety, cognition and judgment appear normal. LN: no cervical axillary inguinal adenopathy No noted deficits in memory, attention, and speech.   Lab Results  Component Value Date   WBC 5.2 09/11/2012   HGB 14.1 09/11/2012   HCT 42.3 09/11/2012   PLT 170.0 09/11/2012   GLUCOSE 87 09/11/2012   CHOL 203* 09/11/2012   TRIG 84.0 09/11/2012   HDL 55.20 09/11/2012   LDLDIRECT 137.0 09/11/2012   ALT 27 09/11/2012   AST 29 09/11/2012   NA 139 09/11/2012   K 3.9 09/11/2012   CL 105 09/11/2012   CREATININE 0.8 09/11/2012   BUN 20 09/11/2012   CO2 27 09/11/2012   TSH 0.53 09/11/2012    ASSESSMENT AND PLAN:  Discussed the following assessment and plan:  Encounter for Medicare annual wellness exam - zostavax today  - Plan: Basic metabolic panel, CBC with Differential, Hepatic function panel, Lipid panel, TSH, POCT urinalysis dipstick  HYPERLIPIDEMIA - Plan: Basic metabolic panel, CBC with Differential, Hepatic function panel, Lipid panel, TSH, POCT urinalysis dipstick  HYPOTHYROIDISM - now on brand med - Plan: Basic metabolic panel, CBC with Differential, Hepatic function panel, Lipid panel, TSH, POCT urinalysis dipstick  OSTEOARTHRITIS - Plan:  Basic metabolic panel, CBC with Differential, Hepatic function panel, Lipid panel, TSH, POCT urinalysis dipstick  Recurrent cold sores - Plan: Basic metabolic panel, CBC with Differential, Hepatic function panel, Lipid panel, TSH, POCT urinalysis dipstick  COPD, moderate - spirometry 2011  has stable sx to see dr bynum soon. - Plan: Basic metabolic panel, CBC with Differential, Hepatic function panel, Lipid panel, TSH, POCT urinalysis dipstick  BREAST CANCER, HX OF - hx lumpectomy rad and chemo adj 2 first degree relatives with br cancer should consider genetic counseling testing has a daughter - Plan: Basic metabolic panel, CBC with Differential, Hepatic function panel, Lipid panel, TSH, POCT urinalysis dipstick  Need for hepatitis C screening test - Plan: Hep C Antibody  Need for shingles vaccine - Plan: Varicella-zoster vaccine subcutaneous  TOBACCO USE, QUIT - continue tobacco free   for years  Hx of nonmelanoma skin cancer  Medication management - paxil  risk bnefiot could try dec slowly and contact us .  Counseled regarding healthy nutrition, exercise, sleep, injury prevention, calcium vit d and healthy weight . Recheck bp when not in office had been ok with the nurse in home  120 range  Patient Care Team: Madelin Headings, MD as PCP - General Osborn Coho, MD Milas Gain, MD (Neurology) [name], MD (Pulmonary Disease) Edmon Crape, MD (Ophthalmology) Javier Docker, MD (Orthopedic Surgery) Amy Y [place], MD as Consulting Physician (Dermatology) Vincenza Hews, MD as Attending Physician (Ophthalmology)  Patient Instructions  Can try  To  Decrease  paxil to 20 alternating to 40 per day and then . Consider  Referral for counseling for breast cancer risk   Because you have 2 first degree relatives with breast cancer at a relatively  early age.  Will notify you  of labs when available. Continue implementing  lifestyle intervention healthy eating and exercise .   rov  depending on labs   Check your blood pressure to ensure below 140/90 and if  Consistently elevated return for check .  Preventive Care for Adults, Female A healthy lifestyle and preventive care can promote health and wellness. Preventive health guidelines for women include the following key practices.  A routine yearly physical is a good way to check with your caregiver about your health and preventive screening. It is a chance to share any concerns and updates on your health, and to receive a thorough exam.  Visit your dentist for a routine exam and preventive care every 6 months. Brush your teeth twice a day and floss once a day. Good oral hygiene prevents tooth decay and gum disease.  The frequency of eye exams is based on your age, health, family medical history, use of contact lenses, and other factors. Follow your caregiver's recommendations for frequency of eye exams.  Eat a healthy diet. Foods like vegetables, fruits, whole grains, low-fat dairy products, and lean protein foods contain the nutrients you need without too many calories. Decrease your intake of foods high in solid fats, added sugars, and salt. Eat the right amount of calories for you.Get information about a proper diet from your caregiver, if necessary.  Regular physical exercise is one of the most important things you can do for your health. Most adults should get at least 150 minutes of moderate-intensity exercise (any activity that increases your heart rate and causes you to sweat) each week. In addition, most adults need muscle-strengthening exercises on 2 or more days a week.  Maintain a healthy weight. The body mass index (BMI) is a screening tool to identify possible weight problems. It provides an estimate of body fat based on height and weight. Your caregiver can help determine your BMI, and can help you achieve or maintain a healthy weight.For adults 20 years and older:  A BMI below 18.5 is considered  underweight.  A BMI of 18.5 to 24.9 is normal.  A BMI of 25 to 29.9 is considered overweight.  A BMI of 30 and above is considered obese.  Maintain normal blood lipids and cholesterol levels by exercising and minimizing your intake of saturated fat. Eat a balanced diet with plenty of fruit and vegetables. Blood tests for lipids and cholesterol should begin at age 64 and be repeated every 5 years. If your lipid or cholesterol levels are high, you are over 50, or you are at high risk for heart disease, you may need your cholesterol levels checked more frequently.Ongoing high lipid and cholesterol levels should be treated with medicines if diet and exercise are not effective.  If you smoke, find out from your caregiver how to quit. If you do not use tobacco, do not start.  If you are pregnant, do not drink alcohol. If you are breastfeeding, be very cautious about drinking alcohol. If you are not pregnant and choose to drink alcohol, do not exceed 1 drink per day. One drink is considered to be 12 ounces (355 mL) of beer, 5 ounces (148 mL) of wine, or 1.5 ounces (44 mL) of liquor.  Avoid use of street drugs. Do not share needles with anyone. Ask for help if you need support or instructions about stopping the use of drugs.  High blood pressure causes heart disease and increases the risk of stroke. Your blood pressure should be checked at  least every 1 to 2 years. Ongoing high blood pressure should be treated with medicines if weight loss and exercise are not effective.  If you are 62 to 68 years old, ask your caregiver if you should take aspirin to prevent strokes.  Diabetes screening involves taking a blood sample to check your fasting blood sugar level. This should be done once every 3 years, after age 34, if you are within normal weight and without risk factors for diabetes. Testing should be considered at a younger age or be carried out more frequently if you are overweight and have at least 1  risk factor for diabetes.  Breast cancer screening is essential preventive care for women. You should practice "breast self-awareness." This means understanding the normal appearance and feel of your breasts and may include breast self-examination. Any changes detected, no matter how small, should be reported to a caregiver. Women in their 43s and 30s should have a clinical breast exam (CBE) by a caregiver as part of a regular health exam every 1 to 3 years. After age 53, women should have a CBE every year. Starting at age 58, women should consider having a mammography (breast X-ray test) every year. Women who have a family history of breast cancer should talk to their caregiver about genetic screening. Women at a high risk of breast cancer should talk to their caregivers about having magnetic resonance imaging (MRI) and a mammography every year.  The Pap test is a screening test for cervical cancer. A Pap test can show cell changes on the cervix that might become cervical cancer if left untreated. A Pap test is a procedure in which cells are obtained and examined from the lower end of the uterus (cervix).  Women should have a Pap test starting at age 47.  Between ages 28 and 73, Pap tests should be repeated every 2 years.  Beginning at age 35, you should have a Pap test every 3 years as long as the past 3 Pap tests have been normal.  Some women have medical problems that increase the chance of getting cervical cancer. Talk to your caregiver about these problems. It is especially important to talk to your caregiver if a new problem develops soon after your last Pap test. In these cases, your caregiver may recommend more frequent screening and Pap tests.  The above recommendations are the same for women who have or have not gotten the vaccine for human papillomavirus (HPV).  If you had a hysterectomy for a problem that was not cancer or a condition that could lead to cancer, then you no longer need  Pap tests. Even if you no longer need a Pap test, a regular exam is a good idea to make sure no other problems are starting.  If you are between ages 59 and 53, and you have had normal Pap tests going back 10 years, you no longer need Pap tests. Even if you no longer need a Pap test, a regular exam is a good idea to make sure no other problems are starting.  If you have had past treatment for cervical cancer or a condition that could lead to cancer, you need Pap tests and screening for cancer for at least 20 years after your treatment.  If Pap tests have been discontinued, risk factors (such as a new sexual partner) need to be reassessed to determine if screening should be resumed.  The HPV test is an additional test that may be used for cervical cancer  screening. The HPV test looks for the virus that can cause the cell changes on the cervix. The cells collected during the Pap test can be tested for HPV. The HPV test could be used to screen women aged 32 years and older, and should be used in women of any age who have unclear Pap test results. After the age of 36, women should have HPV testing at the same frequency as a Pap test.  Colorectal cancer can be detected and often prevented. Most routine colorectal cancer screening begins at the age of 75 and continues through age 55. However, your caregiver may recommend screening at an earlier age if you have risk factors for colon cancer. On a yearly basis, your caregiver may provide home test kits to check for hidden blood in the stool. Use of a small camera at the end of a tube, to directly examine the colon (sigmoidoscopy or colonoscopy), can detect the earliest forms of colorectal cancer. Talk to your caregiver about this at age 39, when routine screening begins. Direct examination of the colon should be repeated every 5 to 10 years through age 32, unless early forms of pre-cancerous polyps or small growths are found.  Hepatitis C blood testing is  recommended for all people born from [birth year] through 1965 and any individual with known risks for hepatitis C.  Practice safe sex. Use condoms and avoid high-risk sexual practices to reduce the spread of sexually transmitted infections (STIs). STIs include gonorrhea, chlamydia, syphilis, trichomonas, herpes, HPV, and human immunodeficiency virus (HIV). Herpes, HIV, and HPV are viral illnesses that have no cure. They can result in disability, cancer, and death. Sexually active women aged 74 and younger should be checked for chlamydia. Older women with new or multiple partners should also be tested for chlamydia. Testing for other STIs is recommended if you are sexually active and at increased risk.  Osteoporosis is a disease in which the bones lose minerals and strength with aging. This can result in serious bone fractures. The risk of osteoporosis can be identified using a bone density scan. Women ages 51 and over and women at risk for fractures or osteoporosis should discuss screening with their caregivers. Ask your caregiver whether you should take a calcium supplement or vitamin D to reduce the rate of osteoporosis.  Menopause can be associated with physical symptoms and risks. Hormone replacement therapy is available to decrease symptoms and risks. You should talk to your caregiver about whether hormone replacement therapy is right for you.  Use sunscreen with sun protection factor (SPF) of 30 or more. Apply sunscreen liberally and repeatedly throughout the day. You should seek shade when your shadow is shorter than you. Protect yourself by wearing long sleeves, pants, a wide-brimmed hat, and sunglasses year round, whenever you are outdoors.  Once a month, do a whole body skin exam, using a mirror to look at the skin on your back. Notify your caregiver of new moles, moles that have irregular borders, moles that are larger than a pencil eraser, or moles that have changed in shape or color.  Stay current  with required immunizations.  Influenza. You need a dose every fall (or winter). The composition of the flu vaccine changes each year, so being vaccinated once is not enough.  Pneumococcal polysaccharide. You need 1 to 2 doses if you smoke cigarettes or if you have certain chronic medical conditions. You need 1 dose at age 55 (or older) if you have never been vaccinated.  Tetanus, diphtheria, pertussis (  Tdap, Td). Get 1 dose of Tdap vaccine if you are younger than age 39, are over 12 and have contact with an infant, are a Research scientist (physical sciences), are pregnant, or simply want to be protected from whooping cough. After that, you need a Td booster dose every 10 years. Consult your caregiver if you have not had at least 3 tetanus and diphtheria-containing shots sometime in your life or have a deep or dirty wound.  HPV. You need this vaccine if you are a woman age 44 or younger. The vaccine is given in 3 doses over 6 months.  Measles, mumps, rubella (MMR). You need at least 1 dose of MMR if you were born in 1957 or later. You may also need a second dose.  Meningococcal. If you are age 72 to 17 and a first-year college student living in a residence hall, or have one of several medical conditions, you need to get vaccinated against meningococcal disease. You may also need additional booster doses.  Zoster (shingles). If you are age 19 or older, you should get this vaccine.  Varicella (chickenpox). If you have never had chickenpox or you were vaccinated but received only 1 dose, talk to your caregiver to find out if you need this vaccine.  Hepatitis A. You need this vaccine if you have a specific risk factor for hepatitis A virus infection or you simply wish to be protected from this disease. The vaccine is usually given as 2 doses, 6 to 18 months apart.  Hepatitis B. You need this vaccine if you have a specific risk factor for hepatitis B virus infection or you simply wish to be protected from this disease.  The vaccine is given in 3 doses, usually over 6 months. Preventive Services / Frequency Ages 24 to 17  Blood pressure check.** / Every 1 to 2 years.  Lipid and cholesterol check.** / Every 5 years beginning at age 44.  Clinical breast exam.** / Every 3 years for women in their 2s and 30s.  Pap test.** / Every 2 years from ages 60 through 53. Every 3 years starting at age 6 through age 28 or 76 with a history of 3 consecutive normal Pap tests.  HPV screening.** / Every 3 years from ages 44 through ages 74 to 71 with a history of 3 consecutive normal Pap tests.  Hepatitis C blood test.** / For any individual with known risks for hepatitis C.  Skin self-exam. / Monthly.  Influenza immunization.** / Every year.  Pneumococcal polysaccharide immunization.** / 1 to 2 doses if you smoke cigarettes or if you have certain chronic medical conditions.  Tetanus, diphtheria, pertussis (Tdap, Td) immunization. / A one-time dose of Tdap vaccine. After that, you need a Td booster dose every 10 years.  HPV immunization. / 3 doses over 6 months, if you are 68 and younger.  Measles, mumps, rubella (MMR) immunization. / You need at least 1 dose of MMR if you were born in 1957 or later. You may also need a second dose.  Meningococcal immunization. / 1 dose if you are age 36 to 65 and a first-year college student living in a residence hall, or have one of several medical conditions, you need to get vaccinated against meningococcal disease. You may also need additional booster doses.  Varicella immunization.** / Consult your caregiver.  Hepatitis A immunization.** / Consult your caregiver. 2 doses, 6 to 18 months apart.  Hepatitis B immunization.** / Consult your caregiver. 3 doses usually over 6 months. Ages  40 to 64  Blood pressure check.** / Every 1 to 2 years.  Lipid and cholesterol check.** / Every 5 years beginning at age 27.  Clinical breast exam.** / Every year after age  7.  Mammogram.** / Every year beginning at age 36 and continuing for as long as you are in good health. Consult with your caregiver.  Pap test.** / Every 3 years starting at age 56 through age 21 or 1 with a history of 3 consecutive normal Pap tests.  HPV screening.** / Every 3 years from ages 42 through ages 30 to 52 with a history of 3 consecutive normal Pap tests.  Fecal occult blood test (FOBT) of stool. / Every year beginning at age 62 and continuing until age 2. You may not need to do this test if you get a colonoscopy every 10 years.  Flexible sigmoidoscopy or colonoscopy.** / Every 5 years for a flexible sigmoidoscopy or every 10 years for a colonoscopy beginning at age 18 and continuing until age 33.  Hepatitis C blood test.** / For all people born from [birth year] through 1966 and any individual with known risks for hepatitis C.  Skin self-exam. / Monthly.  Influenza immunization.** / Every year.  Pneumococcal polysaccharide immunization.** / 1 to 2 doses if you smoke cigarettes or if you have certain chronic medical conditions.  Tetanus, diphtheria, pertussis (Tdap, Td) immunization.** / A one-time dose of Tdap vaccine. After that, you need a Td booster dose every 10 years.  Measles, mumps, rubella (MMR) immunization. / You need at least 1 dose of MMR if you were born in 1957 or later. You may also need a second dose.  Varicella immunization.** / Consult your caregiver.  Meningococcal immunization.** / Consult your caregiver.  Hepatitis A immunization.** / Consult your caregiver. 2 doses, 6 to 18 months apart.  Hepatitis B immunization.** / Consult your caregiver. 3 doses, usually over 6 months. Ages 36 and over  Blood pressure check.** / Every 1 to 2 years.  Lipid and cholesterol check.** / Every 5 years beginning at age 49.  Clinical breast exam.** / Every year after age 6.  Mammogram.** / Every year beginning at age 18 and continuing for as long as you are in good  health. Consult with your caregiver.  Pap test.** / Every 3 years starting at age 23 through age 25 or 53 with a 3 consecutive normal Pap tests. Testing can be stopped between 65 and 70 with 3 consecutive normal Pap tests and no abnormal Pap or HPV tests in the past 10 years.  HPV screening.** / Every 3 years from ages 41 through ages 13 or 72 with a history of 3 consecutive normal Pap tests. Testing can be stopped between 65 and 70 with 3 consecutive normal Pap tests and no abnormal Pap or HPV tests in the past 10 years.  Fecal occult blood test (FOBT) of stool. / Every year beginning at age 1 and continuing until age 65. You may not need to do this test if you get a colonoscopy every 10 years.  Flexible sigmoidoscopy or colonoscopy.** / Every 5 years for a flexible sigmoidoscopy or every 10 years for a colonoscopy beginning at age 13 and continuing until age 46.  Hepatitis C blood test.** / For all people born from [birth year] through 1965 and any individual with known risks for hepatitis C.  Osteoporosis screening.** / A one-time screening for women ages 35 and over and women at risk for fractures or osteoporosis.  Skin  self-exam. / Monthly.  Influenza immunization.** / Every year.  Pneumococcal polysaccharide immunization.** / 1 dose at age 67 (or older) if you have never been vaccinated.  Tetanus, diphtheria, pertussis (Tdap, Td) immunization. / A one-time dose of Tdap vaccine if you are over 65 and have contact with an infant, are a Research scientist (physical sciences), or simply want to be protected from whooping cough. After that, you need a Td booster dose every 10 years.  Varicella immunization.** / Consult your caregiver.  Meningococcal immunization.** / Consult your caregiver.  Hepatitis A immunization.** / Consult your caregiver. 2 doses, 6 to 18 months apart.  Hepatitis B immunization.** / Check with your caregiver. 3 doses, usually over 6 months. ** Family history and personal history of risk and  conditions may change your caregiver's recommendations. Document Released: 08/23/2001 Document Revised: 09/19/2011 Document Reviewed: 11/22/2010 [facility] Patient Information 2013 [place], [place].       Neta Mends. Panosh M.D.  Health Maintenance  Topic Date Due  . Influenza Vaccine  03/11/2013  . Mammogram  04/18/2014  . Colonoscopy  07/12/2015  . Tetanus/tdap  08/11/2016  . Pneumococcal Polysaccharide Vaccine Age 76 And Over  Completed  . Zostavax  Completed   Health Maintenance Review

## 2012-09-11 NOTE — Patient Instructions (Addendum)
Can try  To  Decrease  paxil to 20 alternating to 40 per day and then . Consider  Referral for counseling for breast cancer risk   Because you have 2 first degree relatives with breast cancer at a relatively  early age.  Will notify you  of labs when available. Continue implementing  lifestyle intervention healthy eating and exercise .   rov depending on labs   Check your blood pressure to ensure below 140/90 and if  Consistently elevated return for check .  Preventive Care for Adults, Female A healthy lifestyle and preventive care can promote health and wellness. Preventive health guidelines for women include the following key practices.  A routine yearly physical is a good way to check with your caregiver about your health and preventive screening. It is a chance to share any concerns and updates on your health, and to receive a thorough exam.  Visit your dentist for a routine exam and preventive care every 6 months. Brush your teeth twice a day and floss once a day. Good oral hygiene prevents tooth decay and gum disease.  The frequency of eye exams is based on your age, health, family medical history, use of contact lenses, and other factors. Follow your caregiver's recommendations for frequency of eye exams.  Eat a healthy diet. Foods like vegetables, fruits, whole grains, low-fat dairy products, and lean protein foods contain the nutrients you need without too many calories. Decrease your intake of foods high in solid fats, added sugars, and salt. Eat the right amount of calories for you.Get information about a proper diet from your caregiver, if necessary.  Regular physical exercise is one of the most important things you can do for your health. Most adults should get at least 150 minutes of moderate-intensity exercise (any activity that increases your heart rate and causes you to sweat) each week. In addition, most adults need muscle-strengthening exercises on 2 or more days a  week.  Maintain a healthy weight. The body mass index (BMI) is a screening tool to identify possible weight problems. It provides an estimate of body fat based on height and weight. Your caregiver can help determine your BMI, and can help you achieve or maintain a healthy weight.For adults 20 years and older:  A BMI below 18.5 is considered underweight.  A BMI of 18.5 to 24.9 is normal.  A BMI of 25 to 29.9 is considered overweight.  A BMI of 30 and above is considered obese.  Maintain normal blood lipids and cholesterol levels by exercising and minimizing your intake of saturated fat. Eat a balanced diet with plenty of fruit and vegetables. Blood tests for lipids and cholesterol should begin at age 16 and be repeated every 5 years. If your lipid or cholesterol levels are high, you are over 50, or you are at high risk for heart disease, you may need your cholesterol levels checked more frequently.Ongoing high lipid and cholesterol levels should be treated with medicines if diet and exercise are not effective.  If you smoke, find out from your caregiver how to quit. If you do not use tobacco, do not start.  If you are pregnant, do not drink alcohol. If you are breastfeeding, be very cautious about drinking alcohol. If you are not pregnant and choose to drink alcohol, do not exceed 1 drink per day. One drink is considered to be 12 ounces (355 mL) of beer, 5 ounces (148 mL) of wine, or 1.5 ounces (44 mL) of liquor.  Avoid  use of street drugs. Do not share needles with anyone. Ask for help if you need support or instructions about stopping the use of drugs.  High blood pressure causes heart disease and increases the risk of stroke. Your blood pressure should be checked at least every 1 to 2 years. Ongoing high blood pressure should be treated with medicines if weight loss and exercise are not effective.  If you are 21 to 68 years old, ask your caregiver if you should take aspirin to prevent  strokes.  Diabetes screening involves taking a blood sample to check your fasting blood sugar level. This should be done once every 3 years, after age 34, if you are within normal weight and without risk factors for diabetes. Testing should be considered at a younger age or be carried out more frequently if you are overweight and have at least 1 risk factor for diabetes.  Breast cancer screening is essential preventive care for women. You should practice "breast self-awareness." This means understanding the normal appearance and feel of your breasts and may include breast self-examination. Any changes detected, no matter how small, should be reported to a caregiver. Women in their 62s and 30s should have a clinical breast exam (CBE) by a caregiver as part of a regular health exam every 1 to 3 years. After age 57, women should have a CBE every year. Starting at age 70, women should consider having a mammography (breast X-ray test) every year. Women who have a family history of breast cancer should talk to their caregiver about genetic screening. Women at a high risk of breast cancer should talk to their caregivers about having magnetic resonance imaging (MRI) and a mammography every year.  The Pap test is a screening test for cervical cancer. A Pap test can show cell changes on the cervix that might become cervical cancer if left untreated. A Pap test is a procedure in which cells are obtained and examined from the lower end of the uterus (cervix).  Women should have a Pap test starting at age 83.  Between ages 22 and 28, Pap tests should be repeated every 2 years.  Beginning at age 31, you should have a Pap test every 3 years as long as the past 3 Pap tests have been normal.  Some women have medical problems that increase the chance of getting cervical cancer. Talk to your caregiver about these problems. It is especially important to talk to your caregiver if a new problem develops soon after your last  Pap test. In these cases, your caregiver may recommend more frequent screening and Pap tests.  The above recommendations are the same for women who have or have not gotten the vaccine for human papillomavirus (HPV).  If you had a hysterectomy for a problem that was not cancer or a condition that could lead to cancer, then you no longer need Pap tests. Even if you no longer need a Pap test, a regular exam is a good idea to make sure no other problems are starting.  If you are between ages 27 and 18, and you have had normal Pap tests going back 10 years, you no longer need Pap tests. Even if you no longer need a Pap test, a regular exam is a good idea to make sure no other problems are starting.  If you have had past treatment for cervical cancer or a condition that could lead to cancer, you need Pap tests and screening for cancer for at least 20  years after your treatment.  If Pap tests have been discontinued, risk factors (such as a new sexual partner) need to be reassessed to determine if screening should be resumed.  The HPV test is an additional test that may be used for cervical cancer screening. The HPV test looks for the virus that can cause the cell changes on the cervix. The cells collected during the Pap test can be tested for HPV. The HPV test could be used to screen women aged 74 years and older, and should be used in women of any age who have unclear Pap test results. After the age of 53, women should have HPV testing at the same frequency as a Pap test.  Colorectal cancer can be detected and often prevented. Most routine colorectal cancer screening begins at the age of 59 and continues through age 44. However, your caregiver may recommend screening at an earlier age if you have risk factors for colon cancer. On a yearly basis, your caregiver may provide home test kits to check for hidden blood in the stool. Use of a small camera at the end of a tube, to directly examine the colon  (sigmoidoscopy or colonoscopy), can detect the earliest forms of colorectal cancer. Talk to your caregiver about this at age 28, when routine screening begins. Direct examination of the colon should be repeated every 5 to 10 years through age 26, unless early forms of pre-cancerous polyps or small growths are found.  Hepatitis C blood testing is recommended for all people born from [birth year] through 1965 and any individual with known risks for hepatitis C.  Practice safe sex. Use condoms and avoid high-risk sexual practices to reduce the spread of sexually transmitted infections (STIs). STIs include gonorrhea, chlamydia, syphilis, trichomonas, herpes, HPV, and human immunodeficiency virus (HIV). Herpes, HIV, and HPV are viral illnesses that have no cure. They can result in disability, cancer, and death. Sexually active women aged 48 and younger should be checked for chlamydia. Older women with new or multiple partners should also be tested for chlamydia. Testing for other STIs is recommended if you are sexually active and at increased risk.  Osteoporosis is a disease in which the bones lose minerals and strength with aging. This can result in serious bone fractures. The risk of osteoporosis can be identified using a bone density scan. Women ages 2 and over and women at risk for fractures or osteoporosis should discuss screening with their caregivers. Ask your caregiver whether you should take a calcium supplement or vitamin D to reduce the rate of osteoporosis.  Menopause can be associated with physical symptoms and risks. Hormone replacement therapy is available to decrease symptoms and risks. You should talk to your caregiver about whether hormone replacement therapy is right for you.  Use sunscreen with sun protection factor (SPF) of 30 or more. Apply sunscreen liberally and repeatedly throughout the day. You should seek shade when your shadow is shorter than you. Protect yourself by wearing long sleeves,  pants, a wide-brimmed hat, and sunglasses year round, whenever you are outdoors.  Once a month, do a whole body skin exam, using a mirror to look at the skin on your back. Notify your caregiver of new moles, moles that have irregular borders, moles that are larger than a pencil eraser, or moles that have changed in shape or color.  Stay current with required immunizations.  Influenza. You need a dose every fall (or winter). The composition of the flu vaccine changes each year, so being  vaccinated once is not enough.  Pneumococcal polysaccharide. You need 1 to 2 doses if you smoke cigarettes or if you have certain chronic medical conditions. You need 1 dose at age 68 (or older) if you have never been vaccinated.  Tetanus, diphtheria, pertussis (Tdap, Td). Get 1 dose of Tdap vaccine if you are younger than age 86, are over 39 and have contact with an infant, are a Research scientist (physical sciences), are pregnant, or simply want to be protected from whooping cough. After that, you need a Td booster dose every 10 years. Consult your caregiver if you have not had at least 3 tetanus and diphtheria-containing shots sometime in your life or have a deep or dirty wound.  HPV. You need this vaccine if you are a woman age 33 or younger. The vaccine is given in 3 doses over 6 months.  Measles, mumps, rubella (MMR). You need at least 1 dose of MMR if you were born in 1957 or later. You may also need a second dose.  Meningococcal. If you are age 61 to 38 and a first-year college student living in a residence hall, or have one of several medical conditions, you need to get vaccinated against meningococcal disease. You may also need additional booster doses.  Zoster (shingles). If you are age 60 or older, you should get this vaccine.  Varicella (chickenpox). If you have never had chickenpox or you were vaccinated but received only 1 dose, talk to your caregiver to find out if you need this vaccine.  Hepatitis A. You need this  vaccine if you have a specific risk factor for hepatitis A virus infection or you simply wish to be protected from this disease. The vaccine is usually given as 2 doses, 6 to 18 months apart.  Hepatitis B. You need this vaccine if you have a specific risk factor for hepatitis B virus infection or you simply wish to be protected from this disease. The vaccine is given in 3 doses, usually over 6 months. Preventive Services / Frequency Ages 42 to 16  Blood pressure check.** / Every 1 to 2 years.  Lipid and cholesterol check.** / Every 5 years beginning at age 64.  Clinical breast exam.** / Every 3 years for women in their 47s and 30s.  Pap test.** / Every 2 years from ages 47 through 53. Every 3 years starting at age 63 through age 10 or 87 with a history of 3 consecutive normal Pap tests.  HPV screening.** / Every 3 years from ages 35 through ages 48 to 73 with a history of 3 consecutive normal Pap tests.  Hepatitis C blood test.** / For any individual with known risks for hepatitis C.  Skin self-exam. / Monthly.  Influenza immunization.** / Every year.  Pneumococcal polysaccharide immunization.** / 1 to 2 doses if you smoke cigarettes or if you have certain chronic medical conditions.  Tetanus, diphtheria, pertussis (Tdap, Td) immunization. / A one-time dose of Tdap vaccine. After that, you need a Td booster dose every 10 years.  HPV immunization. / 3 doses over 6 months, if you are 25 and younger.  Measles, mumps, rubella (MMR) immunization. / You need at least 1 dose of MMR if you were born in 1957 or later. You may also need a second dose.  Meningococcal immunization. / 1 dose if you are age 64 to 62 and a first-year college student living in a residence hall, or have one of several medical conditions, you need to get vaccinated against meningococcal  disease. You may also need additional booster doses.  Varicella immunization.** / Consult your caregiver.  Hepatitis A  immunization.** / Consult your caregiver. 2 doses, 6 to 18 months apart.  Hepatitis B immunization.** / Consult your caregiver. 3 doses usually over 6 months. Ages 49 to 67  Blood pressure check.** / Every 1 to 2 years.  Lipid and cholesterol check.** / Every 5 years beginning at age 56.  Clinical breast exam.** / Every year after age 84.  Mammogram.** / Every year beginning at age 22 and continuing for as long as you are in good health. Consult with your caregiver.  Pap test.** / Every 3 years starting at age 38 through age 30 or 71 with a history of 3 consecutive normal Pap tests.  HPV screening.** / Every 3 years from ages 51 through ages 23 to 66 with a history of 3 consecutive normal Pap tests.  Fecal occult blood test (FOBT) of stool. / Every year beginning at age 3 and continuing until age 66. You may not need to do this test if you get a colonoscopy every 10 years.  Flexible sigmoidoscopy or colonoscopy.** / Every 5 years for a flexible sigmoidoscopy or every 10 years for a colonoscopy beginning at age 56 and continuing until age 58.  Hepatitis C blood test.** / For all people born from [birth year] through 1966 and any individual with known risks for hepatitis C.  Skin self-exam. / Monthly.  Influenza immunization.** / Every year.  Pneumococcal polysaccharide immunization.** / 1 to 2 doses if you smoke cigarettes or if you have certain chronic medical conditions.  Tetanus, diphtheria, pertussis (Tdap, Td) immunization.** / A one-time dose of Tdap vaccine. After that, you need a Td booster dose every 10 years.  Measles, mumps, rubella (MMR) immunization. / You need at least 1 dose of MMR if you were born in 1957 or later. You may also need a second dose.  Varicella immunization.** / Consult your caregiver.  Meningococcal immunization.** / Consult your caregiver.  Hepatitis A immunization.** / Consult your caregiver. 2 doses, 6 to 18 months apart.  Hepatitis B immunization.** /  Consult your caregiver. 3 doses, usually over 6 months. Ages 69 and over  Blood pressure check.** / Every 1 to 2 years.  Lipid and cholesterol check.** / Every 5 years beginning at age 37.  Clinical breast exam.** / Every year after age 36.  Mammogram.** / Every year beginning at age 52 and continuing for as long as you are in good health. Consult with your caregiver.  Pap test.** / Every 3 years starting at age 19 through age 77 or 51 with a 3 consecutive normal Pap tests. Testing can be stopped between 65 and 70 with 3 consecutive normal Pap tests and no abnormal Pap or HPV tests in the past 10 years.  HPV screening.** / Every 3 years from ages 30 through ages 58 or 18 with a history of 3 consecutive normal Pap tests. Testing can be stopped between 65 and 70 with 3 consecutive normal Pap tests and no abnormal Pap or HPV tests in the past 10 years.  Fecal occult blood test (FOBT) of stool. / Every year beginning at age 11 and continuing until age 44. You may not need to do this test if you get a colonoscopy every 10 years.  Flexible sigmoidoscopy or colonoscopy.** / Every 5 years for a flexible sigmoidoscopy or every 10 years for a colonoscopy beginning at age 53 and continuing until age 5.  Hepatitis C blood test.** / For all people born from [birth year] through 1966 and any individual with known risks for hepatitis C.  Osteoporosis screening.** / A one-time screening for women ages 91 and over and women at risk for fractures or osteoporosis.  Skin self-exam. / Monthly.  Influenza immunization.** / Every year.  Pneumococcal polysaccharide immunization.** / 1 dose at age 29 (or older) if you have never been vaccinated.  Tetanus, diphtheria, pertussis (Tdap, Td) immunization. / A one-time dose of Tdap vaccine if you are over 65 and have contact with an infant, are a Research scientist (physical sciences), or simply want to be protected from whooping cough. After that, you need a Td booster dose every 10  years.  Varicella immunization.** / Consult your caregiver.  Meningococcal immunization.** / Consult your caregiver.  Hepatitis A immunization.** / Consult your caregiver. 2 doses, 6 to 18 months apart.  Hepatitis B immunization.** / Check with your caregiver. 3 doses, usually over 6 months. ** Family history and personal history of risk and conditions may change your caregiver's recommendations. Document Released: 08/23/2001 Document Revised: 09/19/2011 Document Reviewed: 11/22/2010 [facility] Patient Information 2013 [place], [place].

## 2012-09-12 LAB — HEPATITIS C ANTIBODY: HCV Ab: NEGATIVE

## 2012-09-17 ENCOUNTER — Telehealth: Payer: Self-pay | Admitting: Family Medicine

## 2012-09-17 NOTE — Telephone Encounter (Signed)
Pt will be traveling to [place].  Should she have any further injections before she leaves?

## 2012-09-18 NOTE — Telephone Encounter (Signed)
Patient notified.  She does want to proceed with Twinrix.  She will call back to make appt when she has her calendar.

## 2012-09-18 NOTE — Telephone Encounter (Signed)
Her tetanus is UTD 2008  .  No compelling reason for other vaccines for [place].  But can always get the   Twin rix vaccine for hep a and b  For travel if never done . Check CDC web site  For travel info .

## 2012-09-28 ENCOUNTER — Encounter: Payer: Self-pay | Admitting: Emergency Medicine

## 2012-09-28 ENCOUNTER — Ambulatory Visit (INDEPENDENT_AMBULATORY_CARE_PROVIDER_SITE_OTHER): Payer: PRIVATE HEALTH INSURANCE | Admitting: Emergency Medicine

## 2012-09-28 VITALS — BP 138/80 | HR 99 | Temp 97.0°F | Ht 69.5 in | Wt 176.6 lb

## 2012-09-28 DIAGNOSIS — J449 Chronic obstructive pulmonary disease, unspecified: Secondary | ICD-10-CM

## 2012-09-28 NOTE — Assessment & Plan Note (Signed)
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  I suspect she will miss it.  - trial off symbicort - albuterol prn - rov 3 mon - consider repeat PFT

## 2012-09-28 NOTE — Progress Notes (Signed)
  Subjective:    Patient ID: Jasmine Neal, female    DOB: [birth year], 67 y.o.   MRN: [id number] HPI 67 yo woman with hx of COPD, allergic rhinitis, sinusitis, remote breast CA (chemo + XRT).   ROV 02/09/11 --  moderately severe COPD, allergic rhinitis. Last time her walking oximetry showed no desats. We discussed d/c O2, but have not done so yet. She stopped Spiriva since last visit, doesn't seem to miss it. She remians on Symbicort.   ROV 09/27/11 -- moderately severe COPD, allergic rhinitis. She dropped Spiriva and tolerated it.  Remains on Symbicort but sometimes uses only in the am. Hasn't had any problems since last time.   ROV 09/28/12 -- moderately severe COPD, allergic rhinitis. Follows up after 1 year. Her current regimen is Symbicort qam. No exacerbations since last year. She has been exercising, still gets SOB w stairs. No wheezing. FEV1 0.50-0.72L   Objective:   Physical Exam Filed Vitals:   09/28/12 1340  BP: 138/80  Pulse: 99  Temp: 97 F (36.1 C)  TempSrc: Oral  Height: 5' 9.5" (1.765 m)  Weight: 80.105 kg (176 lb 9.6 oz)  SpO2: 97%   Gen: Pleasant, well-nourished, in no distress,  normal affect  ENT: No lesions, no postnasal drip  Lungs: No use of accessory muscles, no dullness to percussion, clear without rales or rhonchi  Cardiovascular: RRR, heart sounds normal, no murmur or gallops, no peripheral edema  Musculoskeletal: No deformities, no cyanosis or clubbing  Neuro: alert, non focal  Skin: Warm, no lesions or rashes   Assessment & Plan:  COPD, 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  I suspect she will miss it.  - trial off symbicort - albuterol prn - rov 3 mon - consider repeat PFT

## 2012-09-28 NOTE — Patient Instructions (Addendum)
Please stop your Symbicort Continue to have albuterol available to use as needed Call our office if you miss the Symbicort Follow with Dr Delton Coombes in 3 months or sooner if you have any problems.

## 2012-10-08 ENCOUNTER — Other Ambulatory Visit: Payer: Self-pay | Admitting: Internal Medicine

## 2012-10-10 ENCOUNTER — Telehealth: Payer: Self-pay | Admitting: Emergency Medicine

## 2012-10-10 MED ORDER — BUDESONIDE-FORMOTEROL FUMARATE 160-4.5 MCG/ACT IN AERO: 2.0000 | INHALATION_SPRAY | Freq: Two times a day (BID) | RESPIRATORY_TRACT | Status: DC

## 2012-10-10 NOTE — Telephone Encounter (Signed)
[id number] x1 

## 2012-10-10 NOTE — Telephone Encounter (Signed)
I think she should go back to the symbicort. She can start 1 puff bid or 2 puffs bid, but she should start one dose and stick with it so we can discuss next time or by phone how she is responding. Thanks.

## 2012-10-10 NOTE — Telephone Encounter (Signed)
Pt advised and states she will do 2 puffs bid, [facility] requests. Carron Curie, CMA

## 2012-10-10 NOTE — Telephone Encounter (Signed)
ATC patient, no answer [id number] 

## 2012-10-10 NOTE — Telephone Encounter (Signed)
Spoke with patient Patient states she had weaned herself off the symbicort down to 1 puff daily. Patient states she has now started to increase her exercising and "they" have moved her up to another class to start doing more exercises  Patient states with her having asthma she thinks it may be a good idea to increase or Symbicort use back to 2 puffs BID or does Dr.Byrum think there is different inhaler she could use so that she can maximize her exercising. Dr. Delton Coombes please advise, thank you!!   Last OV: 09/28/12 w 3 month f/u not scheduled at this time  Last OV: Patient Instructions    Please stop your Symbicort  Continue to have albuterol available to use as needed  Call our office if you miss the Symbicort  Follow with Dr Delton Coombes in 3 months or sooner if you have any problems

## 2012-12-31 ENCOUNTER — Other Ambulatory Visit: Payer: Self-pay | Admitting: Internal Medicine

## 2012-12-31 ENCOUNTER — Other Ambulatory Visit: Payer: Self-pay | Admitting: Emergency Medicine

## 2013-03-12 ENCOUNTER — Ambulatory Visit: Payer: PRIVATE HEALTH INSURANCE | Admitting: Internal Medicine

## 2013-03-21 ENCOUNTER — Other Ambulatory Visit: Payer: Self-pay

## 2013-03-21 DIAGNOSIS — Z1231 Encounter for screening mammogram for malignant neoplasm of breast: Secondary | ICD-10-CM

## 2013-03-22 ENCOUNTER — Telehealth: Payer: Self-pay | Admitting: Internal Medicine

## 2013-03-22 MED ORDER — VALACYCLOVIR HCL 1 G PO TABS: ORAL_TABLET | ORAL | Status: AC

## 2013-03-22 NOTE — Telephone Encounter (Signed)
Left message at below listed number informing the pt that medication has been sent to the pharmacy.

## 2013-03-22 NOTE — Telephone Encounter (Signed)
Pt requesting new script for valacyclovir (generic Valtrex) for cold sore sent to [facility] [address]

## 2013-04-09 ENCOUNTER — Ambulatory Visit (INDEPENDENT_AMBULATORY_CARE_PROVIDER_SITE_OTHER): Payer: PRIVATE HEALTH INSURANCE | Admitting: Internal Medicine

## 2013-04-09 ENCOUNTER — Encounter: Payer: Self-pay | Admitting: Internal Medicine

## 2013-04-09 VITALS — BP 118/72 | HR 83 | Temp 98.6°F | Resp 10 | Ht 69.0 in | Wt 180.0 lb

## 2013-04-09 DIAGNOSIS — E039 Hypothyroidism, unspecified: Secondary | ICD-10-CM

## 2013-04-09 NOTE — Progress Notes (Signed)
Patient ID: Jasmine Neal, female   DOB: 1946-02-02, 67 y.o.   MRN: [id number]   HPI  AUBERY DATE is a 67 y.o.-year-old female, referred by her PCP, Dr. Fabian Sharp, for evaluation for hypothyroidism.  Pt. has been dx with Hashimoto's hypothyroidism in 1991; is on Synthroid 88 mcg for few months (previously on Levoxyl), taken: - fasting,  - with water - separated by >30 min from b'fast, but mostly skips it or eats it later - no calcium, iron, PPIs, multivitamins   I reviewed pt's thyroid tests: Lab Results  Component Value Date   TSH 0.53 09/11/2012   TSH 0.60 08/26/2011   TSH 0.69 05/25/2010   TSH 0.59 12/29/2008   TSH 0.30* 02/25/2008   TSH 0.87 08/20/2007   TSH 0.55 08/16/2006    Pt denies feeling nodules in neck, hoarseness, dysphagia/odynophagia, SOB with lying down.  Symptom - wise: - no weight gain - no fatigue - no constipation - no dry skin - no hair falling - no problems with concentration - no depression  She has a + FH of thyroid disorders in: older sister. Had FH of MG in father.  No FH of thyroid cancer. No h/o radiation tx to head or neck  I reviewed her chart and she also has a history of moderate COPD, remote history of breast cancer - 1991 (status post chemotherapy and radiation therapy), hyperlipidemia, osteoarthritis, osteopenia, back pain, vertigo. She is on vitamin B12 and vitaminD.  ROS: see HPI + Constitutional: no weight gain/loss, no fatigue, no subjective hyperthermia/hypothermia Eyes: no blurry vision, no xerophthalmia ENT: no sore throat, + nodules palpated in throat, no dysphagia/odynophagia, no hoarseness Cardiovascular: no CP/SOB/palpitations/leg swelling Respiratory: no cough/SOB Gastrointestinal: no N/V/D/C Musculoskeletal: no muscle/joint aches Skin: no rashes Neurological: no tremors/numbness/tingling/dizziness Psychiatric: no depression/anxiety  Past Medical History  Diagnosis Date  . Chronic airway obstruction, not elsewhere classified    . Osteoarthritis   . Hypothyroidism   . Fibromyalgia   . Breast cancer     hx of right lumpectomy, 1990 IIA radiation cmfp ajunct rx  . Dysplasia     hx treated with cerv conization  . Abnormal GTT (glucose tolerance test)   . Fatty liver     US done 2010  . Asthma     evalutated by pulmonary severe by spirometry no smoker currently  . Hyperlipidemia   . Hx of nonmelanoma skin cancer      followed dr [place]    Past Surgical History  Procedure Laterality Date  . Nasal sinus surgery      on right done by Dr. Annalee Genta  . Breast lumpectomy      on right, 1990 IIA radiation cmfp ajuction rx   History   Social History  . Marital Status: Married    Spouse Name: N/A    Number of Children: N/A  . Years of Education: N/A   Occupational History  . Retired Runner, broadcasting/film/video    Social History Main Topics  . Smoking status: Former Smoker -- 0.80 packs/day for 10 years    Types: Cigarettes    Quit date: 07/11/1974  . Smokeless tobacco: Never Used  . Alcohol Use: 3.5 oz/week    7 drink(s) per week     Comment: 1 glass of red wine nightly and socially  . Drug Use: No  . Sexual Activity: Yes    Partners: Male   Other Topics Concern  . Not on file   Social History Narrative   Retired Runner, broadcasting/film/video  hh of 2     Quit smoking 30 + years ago. 20 year pack year smoking hx   Regular exercise- some  treadmill    8 hours sleep   No current pets       Caffeine use: 1-2 cups of coffee/[employer] instant   Current Outpatient Prescriptions on File Prior to Visit  Medication Sig Dispense Refill  . acetaminophen (TYLENOL) 500 MG tablet Take 1,000 mg by mouth every 6 (six) hours as needed. pain       . albuterol (VENTOLIN HFA) 108 (90 BASE) MCG/ACT inhaler Inhale 1 puff into the lungs every 6 (six) hours as needed. Shortness of breath and wheezing      . aspirin 81 MG tablet Take 81 mg by mouth daily.      . Cholecalciferol (VITAMIN D) 1000 UNITS capsule Take 1,000 Units by mouth daily.        .  diphenhydramine-acetaminophen (TYLENOL PM) 25-500 MG TABS Take 1 tablet by mouth at bedtime as needed. sleep      . levothyroxine (SYNTHROID, LEVOTHROID) 88 MCG tablet Take 88 mcg by mouth daily. Brand name only      . PARoxetine (PAXIL) 40 MG tablet Take 1 tablet by mouth   every night at bedtime  90 tablet  2  . SYMBICORT 160-4.5 MCG/ACT inhaler Use 2 puffs twice daily  1 Inhaler  3  . SYNTHROID 88 MCG tablet Take 1 tablet by mouth  daily  90 tablet  2  . valACYclovir (VALTREX) 1000 MG tablet Take 2 po bid as directed  30 tablet  1  . budesonide-formoterol (SYMBICORT) 160-4.5 MCG/ACT inhaler Inhale 2 puffs into the lungs 2 (two) times daily.  3 Inhaler  3   No current facility-administered medications on file prior to visit.   Allergies  Allergen Reactions  . Hydrocodone Itching    Face swelled up   Family History  Problem Relation Age of Onset  . Osteoporosis    . Thyroid disease    . Breast cancer Mother   . Osteoporosis Mother     no hip fracture  . Breast cancer Sister     x 3  . Lung cancer Sister   . Other Father     mysthenia gravis   PE: BP 118/72  Pulse 83  Temp(Src) 98.6 F (37 C) (Oral)  Resp 10  Ht 5\' 9"  (1.753 m)  Wt 180 lb (81.647 kg)  BMI 26.57 kg/m2  SpO2 95% Wt Readings from Last 3 Encounters:  04/09/13 180 lb (81.647 kg)  09/28/12 176 lb 9.6 oz (80.105 kg)  09/11/12 178 lb (80.74 kg)   Constitutional: slightly overweight, in NAD Eyes: PERRLA, EOMI, no exophthalmos ENT: moist mucous membranes, no thyromegaly, no cervical lymphadenopathy Cardiovascular: RRR, No MRG Respiratory: CTA B Gastrointestinal: abdomen soft, NT, ND, BS+ Musculoskeletal: no deformities, strength intact in all 4 Skin: moist, warm, no rashes Neurological: no tremor with outstretched hands, DTR normal in all 4  ASSESSMENT: 1. Hypothyroidism - on brand name Synthroid 88  PLAN:  1. Patient with long-standing hypothyroidism, on levothyroxine therapy. She appears  euthyroid. - We discussed about correct intake of levothyroxine, fasting, with water, separated by at least 30 minutes from breakfast, and separated by more than 4 hours from calcium, iron, multivitamins, acid reflux medications (PPIs). - She does not appear to have a goiter, thyroid nodules, or neck compression symptoms - the patient brings a list of possible thyroid tests that we might check. I explained  the fact that the check of her antibodies would not make a difference for her diagnosis (the titer might be low despite a history of Hashimoto thyroiditis, as they decrease in time), and also I do not see the need to check a T3 uptake, free thyroxine index (older tests, nowadays replaced with free T4 and free T3 checks), or total T4 (free T4 is preferred). Similarly, if a TSH and a free T4 are normal, there is no need to check a free T3. She understands and she agrees with foregoing this tests - I offered to check a free T4 and a TSH, however I did explain that I do not expect those to be abnormal, since she had a normal, very stable TSH at every check in the last 5 years. She agrees with this and would like to have her thyroid rechecked at her annual visit with Dr. Fabian Sharp - she asked me whether I believe that her thyroid is connected in any way with her breathing problems (she has shortness of breath with exertion for the last 8 years). I explained that the 2 thyroid conditions that can cause shortness of breath are: Severe Graves' disease and thyroid nodules compressing the trachea. She does not have any of the above. I believe that she probably has reactive airway disease and we discussed about using her albuterol before going up or hill or going up few flights of stairs. She already has albuterol on her medication list. She is seeing pulmonology. A heart condition that might cause shortness of breath, however she does not have a history of heart disease, does not have palpitations or chest pain.  - was  not schedule followup appointment, however she can return as needed.

## 2013-04-09 NOTE — Patient Instructions (Addendum)
Please continue to take the Synthroid as you are already doing: every day, with water, >30 min before b'fast, separated by >4h from anti acid medication, calcium, iron, multivitamins.  Please continue to see Dr. Fabian Sharp for the thyroid checks, but you can always return or send me a MyChart message if thyroid issues.

## 2013-05-10 ENCOUNTER — Ambulatory Visit
Admission: RE | Admit: 2013-05-10 | Discharge: 2013-05-10 | Disposition: A | Discharge status: Home / Self Care | Payer: Medicare Other | Source: Ambulatory Visit

## 2013-05-10 DIAGNOSIS — Z1231 Encounter for screening mammogram for malignant neoplasm of breast: Secondary | ICD-10-CM

## 2013-05-14 ENCOUNTER — Ambulatory Visit: Payer: PRIVATE HEALTH INSURANCE | Admitting: Emergency Medicine

## 2013-05-16 ENCOUNTER — Other Ambulatory Visit: Payer: Self-pay

## 2013-06-04 ENCOUNTER — Ambulatory Visit (INDEPENDENT_AMBULATORY_CARE_PROVIDER_SITE_OTHER): Payer: Medicare Other

## 2013-06-04 DIAGNOSIS — Z23 Encounter for immunization: Secondary | ICD-10-CM

## 2013-10-09 ENCOUNTER — Encounter: Payer: Self-pay | Admitting: Internal Medicine

## 2013-10-09 ENCOUNTER — Ambulatory Visit (INDEPENDENT_AMBULATORY_CARE_PROVIDER_SITE_OTHER): Payer: Medicare Other | Admitting: Internal Medicine

## 2013-10-09 VITALS — BP 130/80 | HR 79 | Temp 97.9°F | Ht 68.75 in | Wt 178.0 lb

## 2013-10-09 DIAGNOSIS — Z0000 Encounter for general adult medical examination without abnormal findings: Secondary | ICD-10-CM

## 2013-10-09 DIAGNOSIS — Z853 Personal history of malignant neoplasm of breast: Secondary | ICD-10-CM

## 2013-10-09 DIAGNOSIS — E039 Hypothyroidism, unspecified: Secondary | ICD-10-CM

## 2013-10-09 DIAGNOSIS — Z803 Family history of malignant neoplasm of breast: Secondary | ICD-10-CM

## 2013-10-09 DIAGNOSIS — E785 Hyperlipidemia, unspecified: Secondary | ICD-10-CM

## 2013-10-09 DIAGNOSIS — Z79899 Other long term (current) drug therapy: Secondary | ICD-10-CM

## 2013-10-09 DIAGNOSIS — Z23 Encounter for immunization: Secondary | ICD-10-CM

## 2013-10-09 DIAGNOSIS — F411 Generalized anxiety disorder: Secondary | ICD-10-CM

## 2013-10-09 DIAGNOSIS — J449 Chronic obstructive pulmonary disease, unspecified: Secondary | ICD-10-CM

## 2013-10-09 LAB — BASIC METABOLIC PANEL
BUN: 19 mg/dL (ref 6–23)
CHLORIDE: 104 meq/L (ref 96–112)
CO2: 28 meq/L (ref 19–32)
Calcium: 10.1 mg/dL (ref 8.4–10.5)
Creatinine, Ser: 1 mg/dL (ref 0.4–1.2)
GFR: 57.13 mL/min — ABNORMAL LOW (ref >60.00)
Glucose, Bld: 89 mg/dL (ref 70–99)
POTASSIUM: 4.8 meq/L (ref 3.5–5.1)
SODIUM: 140 meq/L (ref 135–145)

## 2013-10-09 LAB — CBC WITH DIFFERENTIAL/PLATELET
BASOS PCT: 0.7 % (ref 0.0–3.0)
Basophils Absolute: 0 10*3/uL (ref 0.0–0.1)
Eosinophils Absolute: 0.2 10*3/uL (ref 0.0–0.7)
Eosinophils Relative: 3.1 % (ref 0.0–5.0)
HCT: 43.1 % (ref 36.0–46.0)
HEMOGLOBIN: 14.3 g/dL (ref 12.0–15.0)
Lymphocytes Relative: 34.5 % (ref 12.0–46.0)
Lymphs Abs: 1.9 10*3/uL (ref 0.7–4.0)
MCHC: 33.2 g/dL (ref 30.0–36.0)
MCV: 91.6 fl (ref 78.0–100.0)
MONO ABS: 0.4 10*3/uL (ref 0.1–1.0)
Monocytes Relative: 6.3 % (ref 3.0–12.0)
NEUTROS ABS: 3.1 10*3/uL (ref 1.4–7.7)
Neutrophils Relative %: 55.4 % (ref 43.0–77.0)
Platelets: 190 10*3/uL (ref 150.0–400.0)
RBC: 4.7 Mil/uL (ref 3.87–5.11)
RDW: 13.2 % (ref 11.5–14.6)
WBC: 5.6 10*3/uL (ref 4.5–10.5)

## 2013-10-09 LAB — HEPATIC FUNCTION PANEL
ALBUMIN: 4.2 g/dL (ref 3.5–5.2)
ALT: 25 U/L (ref 0–35)
AST: 25 U/L (ref 0–37)
Alkaline Phosphatase: 70 U/L (ref 39–117)
Bilirubin, Direct: 0.1 mg/dL (ref 0.0–0.3)
TOTAL PROTEIN: 7.1 g/dL (ref 6.0–8.3)
Total Bilirubin: 0.8 mg/dL (ref 0.3–1.2)

## 2013-10-09 LAB — LIPID PANEL
CHOL/HDL RATIO: 4
CHOLESTEROL: 212 mg/dL — AB (ref 0–200)
HDL: 58.1 mg/dL (ref >39.00)
LDL Cholesterol: 135 mg/dL — ABNORMAL HIGH (ref 0–99)
Triglycerides: 95 mg/dL (ref 0.0–149.0)
VLDL: 19 mg/dL (ref 0.0–40.0)

## 2013-10-09 LAB — TSH: TSH: 0.81 u[IU]/mL (ref 0.35–5.50)

## 2013-10-09 MED ORDER — SYNTHROID 88 MCG PO TABS: ORAL_TABLET | ORAL | Status: DC

## 2013-10-09 MED ORDER — PAROXETINE HCL 40 MG PO TABS: ORAL_TABLET | ORAL | Status: DC

## 2013-10-09 NOTE — Progress Notes (Signed)
Chief Complaint  Patient presents with  . Medicare Wellness    HPI: Patient comes in today for Preventive Medicare wellness visit . No major injuries, ed visits ,hospitalizations , new medications since last visit.  Asthma COPD ?Back to dr Carmelina Peal   And seeing him for  A while.   About 2 different inhalers  still has shortness of breath on going up hills on Asmanex and an ellipta  Anxiety : paxil  better on. Some symptoms when tried to wean doing fine now.  Thyroid: Taking oriented Synthroid 88 needs refill  Musculoskeletal back pain about the same tries to stay active no major changes in skin.  Health Maintenance  Topic Date Due  . Influenza Vaccine  02/08/2014  . Mammogram  05/11/2015  . Tetanus/tdap  08/11/2016  . Colonoscopy  05/14/2023  . Pneumococcal Polysaccharide Vaccine Age 49 And Over  Completed  . Zostavax  Completed   Health Maintenance Review     Hearing: ok  Vision:  No limitations at present . Last eye check UTD  Safety:  Has smoke detector and wears seat belts.  No firearms. No excess sun exposure. Sees dentist regularly.  Falls: no  Advance directive :  Reviewed   Memory: Felt to be good  , no concern from her or her family.  Depression: No anhedonia unusual crying or depressive symptoms  Nutrition: Eats well balanced diet; adequate calcium and vitamin D. No swallowing chewing problems.  Injury: no major injuries in the last six months.  Other healthcare providers:  Reviewed today .  Social:  Lives with spouse married. No pets.   Preventive parameters: up-to-date  Reviewed   ADLS:   There are no problems or need for assistance  driving, feeding, obtaining food, dressing, toileting and bathing, managing money using phone. She is independent.  EXERCISE/ HABITS  Per week    Curves 3 x per week No tobacco   ,red wine with dinner or more etoh,no sugar drinks  But likes sweets.    ROS: h x scca chest neck GEN/ HEENT: No fever, significant  weight changes sweats headaches vision problems hearing changes, CV/ PULM; No chest pain cough, syncope,edema  . GI /GU: No adominal pain, vomiting, change in bowel habits. No blood in the stool. No significant GU symptoms. SKIN/HEME: ,no acute skin rashes suspicious lesions or bleeding. No lymphadenopathy, nodules, masses.  NEURO/ PSYCH:  No neurologic signs such as weakness numbness. No depression anxiety. IMM/ Allergy: No unusual infections.  Allergy .   REST of 12 system review negative except as per HPI   Past Medical History  Diagnosis Date  . Chronic airway obstruction, not elsewhere classified   . Osteoarthritis   . Hypothyroidism   . Fibromyalgia   . Breast cancer     hx of right lumpectomy, 1990 IIA radiation cmfp ajunct rx  . Dysplasia     hx treated with cerv conization  . Abnormal GTT (glucose tolerance test)   . Fatty liver     US done 2010  . Asthma     evalutated by pulmonary severe by spirometry no smoker currently  . Hyperlipidemia   . Hx of nonmelanoma skin cancer      followed dr [place]     Family History  Problem Relation Age of Onset  . Osteoporosis    . Thyroid disease    . Breast cancer Mother   . Osteoporosis Mother     no hip fracture  . Breast cancer Sister  x 3  . Lung cancer Sister   . Other Father     mysthenia gravis    History   Social History  . Marital Status: Married    Spouse Name: N/A    Number of Children: N/A  . Years of Education: N/A   Occupational History  . Retired Pharmacist, hospital    Social History Main Topics  . Smoking status: Former Smoker -- 0.80 packs/day for 10 years    Types: Cigarettes    Quit date: 07/11/1974  . Smokeless tobacco: Never Used  . Alcohol Use: 3.5 oz/week    7 drink(s) per week     Comment: 1 glass of red wine nightly and socially  . Drug Use: No  . Sexual Activity: Yes    Partners: Male   Other Topics Concern  . None   Social History Narrative   Retired Pharmacist, hospital    hh of 2     Quit  smoking 30 + years ago. 20 year pack year smoking hx   Regular exercise- some  treadmill    8 hours sleep   No current pets       Caffeine use: 1-2 cups of coffee/[employer] instant    Outpatient Encounter Prescriptions as of 10/09/2013  Medication Sig  . acetaminophen (TYLENOL) 500 MG tablet Take 1,000 mg by mouth every 6 (six) hours as needed. pain   . aspirin 81 MG tablet Take 81 mg by mouth daily.  . Cholecalciferol (VITAMIN D3) 2000 UNITS TABS Take 2,000 Units by mouth daily.  . Cyanocobalamin (B-12 PO) Take 1,500 mg by mouth daily.  . diphenhydramine-acetaminophen (TYLENOL PM) 25-500 MG TABS Take 1 tablet by mouth at bedtime as needed. sleep  . mometasone (ASMANEX) 220 MCG/INH inhaler Inhale 2 puffs into the lungs daily.  Marland Kitchen PARoxetine (PAXIL) 40 MG tablet Take 1 tablet by mouth   every night at bedtime  . SYNTHROID 88 MCG tablet Take 1 tablet by mouth  daily  . Umeclidinium-Vilanterol (ANORO ELLIPTA) 62.5-25 MCG/INH AEPB Inhale 1 puff into the lungs daily.  . valACYclovir (VALTREX) 1000 MG tablet Take 2 po bid as directed  . [DISCONTINUED] PARoxetine (PAXIL) 40 MG tablet Take 1 tablet by mouth   every night at bedtime  . [DISCONTINUED] SYNTHROID 88 MCG tablet Take 1 tablet by mouth  daily  . [DISCONTINUED] albuterol (VENTOLIN HFA) 108 (90 BASE) MCG/ACT inhaler Inhale 1 puff into the lungs every 6 (six) hours as needed. Shortness of breath and wheezing  . [DISCONTINUED] budesonide-formoterol (SYMBICORT) 160-4.5 MCG/ACT inhaler Inhale 2 puffs into the lungs 2 (two) times daily.  . [DISCONTINUED] Cholecalciferol (VITAMIN D) 1000 UNITS capsule Take 1,000 Units by mouth daily.    . [DISCONTINUED] levothyroxine (SYNTHROID, LEVOTHROID) 88 MCG tablet Take 88 mcg by mouth daily. Brand name only  . [DISCONTINUED] SYMBICORT 160-4.5 MCG/ACT inhaler Use 2 puffs twice daily    EXAM:  BP 130/80  Pulse 79  Temp(Src) 97.9 F (36.6 C) (Oral)  Ht 5' 8.75" (1.746 m)  Wt 178 lb (80.74 kg)  BMI 26.49  kg/m2  SpO2 96%  Body mass index is 26.49 kg/(m^2).  Physical Exam: Vital signs reviewed [id number] is a well-developed well-nourished alert cooperative   who appears stated age in no acute distress.  HEENT: normocephalic atraumatic , Eyes: PERRL EOM's full, conjunctiva clear, Nares: paten,t no deformity discharge or tenderness., Ears: no deformity EAC's clear TMs with normal landmarks. Mouth: clear OP, no lesions, edema.  Moist mucous membranes. Dentition in adequate repair.  NECK: supple without masses, thyromegaly or bruits. CHEST/PULM:  Clear to auscultation and percussion breath sounds equal no wheeze , rales or rhonchi. No chest wall deformities or tenderness. Breast: Right distorted from previous treatment and no masses or edema axilla is clear left breast exam normal without dimpling or masses. CV: PMI is nondisplaced, S1 S2 no gallops, murmurs, rubs. Peripheral pulses are full without delay.No JVD .  ABDOMEN: Bowel sounds normal nontender  No guard or rebound, no hepato splenomegal no CVA tenderness.  . Extremtities:  No clubbing cyanosis or edema, no acute joint swelling or redness no focal atrophy some mild arthritis changes NEURO:  Oriented x3, cranial nerves 3-12 appear to be intact, no obvious focal weakness,gait within normal limits no abnormal reflexes or asymmetrical SKIN: No acute rashes normal turgor, color, no bruising or petechiae. Some changes throughout PSYCH: Oriented, good eye contact, no obvious depression anxiety, cognition and judgment appear normal. LN: no cervical axillary inguinal adenopathy No noted deficits in memory, attention, and speech.  Lab Results  Component Value Date   WBC 5.6 10/09/2013   HGB 14.3 10/09/2013   HCT 43.1 10/09/2013   PLT 190.0 10/09/2013   GLUCOSE 89 10/09/2013   CHOL 212* 10/09/2013   TRIG 95.0 10/09/2013   HDL 58.10 10/09/2013   LDLDIRECT 137.0 09/11/2012   LDLCALC 135* 10/09/2013   ALT 25 10/09/2013   AST 25 10/09/2013   NA 140 10/09/2013   K 4.8  10/09/2013   CL 104 10/09/2013   CREATININE 1.0 10/09/2013   BUN 19 10/09/2013   CO2 28 10/09/2013   TSH 0.81 10/09/2013    ASSESSMENT AND PLAN:  Discussed the following assessment and plan:  Visit for preventive health examination - prevnar 13 utd otherwise  Medication management - Plan: Basic metabolic panel, CBC with Differential, Hepatic function panel, TSH, Lipid panel  BREAST CANCER, HX OF - Plan: Basic metabolic panel, CBC with Differential, Hepatic function panel, TSH, Lipid panel, Ambulatory referral to Genetics  HYPOTHYROIDISM - lab today - Plan: Basic metabolic panel, CBC with Differential, Hepatic function panel, TSH, Lipid panel  HYPERLIPIDEMIA - Plan: Basic metabolic panel, CBC with Differential, Hepatic function panel, TSH, Lipid panel  COPD, moderate - seeing dr Carmelina Peal currently - Plan: Basic metabolic panel, CBC with Differential, Hepatic function panel, TSH, Lipid panel  ANXIETY - controleld on paxil - Plan: Basic metabolic panel, CBC with Differential, Hepatic function panel, TSH, Lipid panel  Need for vaccination with 13-polyvalent pneumococcal conjugate vaccine - Plan: Pneumococcal conjugate vaccine [id number], Basic metabolic panel, CBC with Differential, Hepatic function panel, TSH, Lipid panel  Family history of breast cancer in first degree relative x 2  - Plan: Ambulatory referral to Genetics Discussed as we did last year consideration of genetic profiling testing because of her personal history of breast cancer in her 2 first degree relatives. She has a daughter who is 43. Following some abnormality on mammogram. We'll arrange referral for counseling testing if appropriate Patient Care Team: Burnis Medin, MD as PCP - General Jerrell Belfast, MD Clearnce Sorrel, MD (Neurology) Hurman Horn, MD (Ophthalmology) Johnn Hai, MD (Orthopedic Surgery) Amy Y [place], MD as Consulting Physician (Dermatology) Fabio Pierce, MD as Attending Physician  (Ophthalmology) Jiles Prows, MD as Consulting Physician (Allergy and Immunology)  Patient Instructions  Continue same medication  Check blood pressure readings   To ensure below 140/90   Goal. If elevated continuously then we would add medication to control. Continue lifestyle intervention healthy  eating and exercise . Will plan on arranging or referral as possible bor genetic counseling as we discussed .  Will notify you  of labs when available.  if ok then yearly wellness and med check     Standley Brooking. Makeila Yamaguchi M.D.     Pre visit review using our clinic review tool, if applicable. No additional management support is needed unless otherwise documented below in the visit note.

## 2013-10-09 NOTE — Patient Instructions (Addendum)
Continue same medication  Check blood pressure readings   To ensure below 140/90   Goal. If elevated continuously then we would add medication to control. Continue lifestyle intervention healthy eating and exercise . Will plan on arranging or referral as possible bor genetic counseling as we discussed .  Will notify you  of labs when available.  if ok then yearly wellness and med check

## 2013-10-11 ENCOUNTER — Telehealth: Payer: Self-pay | Admitting: *Deleted

## 2013-10-11 NOTE — Telephone Encounter (Signed)
Called and confirmed 12/06/13 genetic appt w pt.  Mailed welcoming packet & calendar to pt.  Emailed referring to make them aware.

## 2013-11-22 ENCOUNTER — Telehealth: Payer: Self-pay | Admitting: *Deleted

## 2013-11-22 NOTE — Telephone Encounter (Signed)
Pt left me a message requesting to cancel her genetic appt on 5/29 due to her being out of town.  I called and left a message stating that I have cancelled the appt as requested and for her to call me back and I would get her rescheduled.

## 2013-11-26 ENCOUNTER — Telehealth: Payer: Self-pay | Admitting: *Deleted

## 2013-11-26 NOTE — Telephone Encounter (Signed)
Pt called ready to schedule her genetic appt and I confirmed 11/28/13 genetic appt w/ pt.

## 2013-11-28 ENCOUNTER — Ambulatory Visit (HOSPITAL_BASED_OUTPATIENT_CLINIC_OR_DEPARTMENT_OTHER): Payer: Medicare Other | Admitting: Genetic Counselor

## 2013-11-28 ENCOUNTER — Other Ambulatory Visit: Payer: Medicare Other

## 2013-11-28 DIAGNOSIS — Z801 Family history of malignant neoplasm of trachea, bronchus and lung: Secondary | ICD-10-CM

## 2013-11-28 DIAGNOSIS — Z803 Family history of malignant neoplasm of breast: Secondary | ICD-10-CM

## 2013-11-28 DIAGNOSIS — C50919 Malignant neoplasm of unspecified site of unspecified female breast: Secondary | ICD-10-CM

## 2013-11-28 HISTORY — DX: Malignant neoplasm of unspecified site of unspecified female breast: C50.919

## 2013-11-28 NOTE — Progress Notes (Signed)
HISTORY OF PRESENT ILLNESS: Dr. Regis Bill requested a cancer genetics consultation for Jasmine Neal, a 68 y.o. female, due to a personal and family history of cancer.  Jasmine Neal presents to clinic today to discuss the possibility of a hereditary predisposition to cancer, genetic testing, and to further clarify her future cancer risks, as well as potential cancer risk for family members.    Past Medical History  Diagnosis Date   Chronic airway obstruction, not elsewhere classified    Osteoarthritis    Hypothyroidism    Fibromyalgia    Breast cancer     hx of right lumpectomy, 1990 IIA radiation cmfp ajunct rx   Dysplasia     hx treated with cerv conization   Abnormal GTT (glucose tolerance test)    Fatty liver     US done 2010   Asthma     evalutated by pulmonary severe by spirometry no smoker currently   Hyperlipidemia    Hx of nonmelanoma skin cancer      followed dr Martinique     Past Surgical History  Procedure Laterality Date   Nasal sinus surgery      on right done by Dr. Wilburn Cornelia   Breast lumpectomy      on right, 1990 IIA radiation cmfp ajuction rx   History   Social History   Marital Status: Married    Spouse Name: N/A    Number of Children: N/A   Years of Education: N/A   Occupational History   Retired Pharmacist, hospital    Social History Main Topics   Smoking status: Former Smoker -- 0.80 packs/day for 10 years    Types: Cigarettes    Quit date: 07/11/1974   Smokeless tobacco: Never Used   Alcohol Use: 3.5 oz/week    7 drink(s) per week     Comment: 1 glass of red wine nightly and socially   Drug Use: No   Sexual Activity: Yes    Partners: Male   Other Topics Concern   Not on file   Social History Narrative   Retired Pharmacist, hospital    hh of 2     Quit smoking 30 + years ago. 20 year pack year smoking hx   Regular exercise- some  treadmill    8 hours sleep   No current pets       Caffeine use: 1-2 cups of coffee/[employer] instant      FAMILY HISTORY:  During the visit, a 4-generation pedigree was obtained. Significant diagnoses include the following:  Family History  Problem Relation Age of Onset   Osteoporosis     Thyroid disease     Breast cancer Mother 36    possible uterine cancer diagnosis at age 62   Osteoporosis Mother     no hip fracture   Breast cancer Sister 46   Lung cancer Sister 33    former smoker   Other Father     mysthenia gravis    Jasmine Neal's ancestry is of Caucasian descent. There is no known Jewish ancestry or consanguinity.  GENETIC COUNSELING ASSESSMENT: Jasmine Neal is a 68 y.o. female with a personal and family history of cancer suggestive of a hereditary predisposition to cancer. We, therefore, discussed and recommended the following at today's visit.   DISCUSSION: We reviewed the characteristics, features and inheritance patterns of hereditary cancer syndromes. We also discussed genetic testing, including the appropriate family members to test, the process of testing, insurance coverage and turn-around-time for results. We discussed the implications  of a negative, positive and/or variant of uncertain significant result. We recommended Jasmine Neal pursue genetic testing for the BreastNext gene panel at [facility].   PLAN: Based on our above recommendation, Jasmine Neal wished to pursue genetic testing and the blood sample was drawn and will be sent to [facility] for analysis. Results should be available within approximately 6 weeks time, at which point they will be disclosed by telephone to Jasmine Neal, as will any additional recommendations warranted by these results. We encouraged Jasmine Neal to remain in contact with cancer genetics annually so that we can continuously update the family history and inform her of any changes in cancer genetics and testing that may be of benefit for this family. Ms.  Neal questions were answered to her satisfaction today.  Our contact information was provided should additional questions or concerns arise.   Thank you for the referral and allowing [place] to share in the care of your patient.   The patient was seen for a total of 45 minutes in face-to-face counseling.  This patient was discussed with Panosh who agrees with the above.    _______________________________________________________________________ For Office Staff:  Number of people involved in session: 2 Was an Intern/ student involved with case: no

## 2013-12-06 ENCOUNTER — Other Ambulatory Visit: Payer: Medicare Other

## 2014-01-01 ENCOUNTER — Encounter: Payer: Self-pay | Admitting: Internal Medicine

## 2014-01-02 ENCOUNTER — Encounter: Payer: Self-pay | Admitting: Genetic Counselor

## 2014-01-02 DIAGNOSIS — C50919 Malignant neoplasm of unspecified site of unspecified female breast: Secondary | ICD-10-CM

## 2014-01-02 DIAGNOSIS — Z803 Family history of malignant neoplasm of breast: Secondary | ICD-10-CM

## 2014-01-02 NOTE — Progress Notes (Signed)
HPI:  Ms. Hally was previously seen in the [place] clinic due to a personal and family history of cancer and concerns regarding a hereditary predisposition to cancer. Please refer to our prior cancer genetics clinic note for more information regarding Ms. Upadhyay's medical, social and family histories, and our assessment and recommendations, at the time. Ms. Aerts recent genetic test results were disclosed to her, as were recommendations warranted by these results. These results and recommendations are discussed in more detail below.  GENETIC TEST RESULTS: At the time of Ms. Moragne's visit, we recommended she pursue genetic testing of the BreastNext gene panel. This test, which included sequencing and deletion/duplication analysis of the genes listed on the test report, was performed at [facility]. Genetic testing was normal, and did not reveal a mutation in these genes. A complete list of all genes tested is located on the test report scanned into [employer].  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 At this time, it is unknown if this variant is associated with an increased risk for cancer or if this is a normal finding, thus we would not make medical management decisions based on this variant. With time, we suspect the lab will reclassify this variant and when they do, we will try to re-contact Ms. Mohammad to discuss the reclassification further.   We discussed with Ms. Wedel that since the current genetic testing is not perfect, it is possible there may be a gene mutation in one of these genes that current testing cannot detect, but that chance is small.  We also discussed, that it is possible that another gene that has not yet been discovered, or that we have not yet tested, is responsible for the cancer diagnoses in the family, and it is, therefore, important to remain in touch with cancer genetics in the future so  that we can continue to offer Ms. Dimario the most up to date genetic testing.   CANCER SCREENING RECOMMENDATIONS: This result is generally reassuring and suggests that Ms. Hand's cancer was most likely not due to an inherited predisposition associated with one of these genes.  Most cancers happen by chance and this negative test, along with details of her family history, suggests that her cancer falls into this category.  We, therefore, recommended she continue to follow the cancer management and screening guidelines provided by her oncology and primary providers.   RECOMMENDATIONS FOR FAMILY MEMBERS:  Women in this family might be at some increased risk of developing cancer, over the general population risk, simply due to the family history of cancer.  We recommended women in this family have a yearly mammogram beginning at age 69, an an annual clinical breast exam, and perform monthly breast self-exams. Women in this family should also have a gynecological exam as recommended by their primary provider. All family members should have a colonoscopy by age 65.  FOLLOW-UP: Lastly, we discussed with Ms. Konitzer that cancer genetics is a rapidly advancing field and it is possible that new genetic tests will be appropriate for her and/or her family members in the future. We encouraged her to remain in contact with cancer genetics on an annual basis so we can update her personal and family histories and let her know of advances in cancer genetics that may benefit this family.   Our contact number was provided. Ms. Risden questions were answered to her satisfaction, and she knows she is welcome to call us at anytime  with additional questions or concerns. This patient was discussed with Dr. Regis Bill who agrees with the above.   Catherine A. Fine, MS, CGC Certified Genetic Counseor phone: [phone] [email or web address]

## 2014-01-15 NOTE — Telephone Encounter (Signed)
Many of the oncologist here have left and new ones coming so not  Sure who would be best to see  Are you having other concern  In the interim ?

## 2014-06-10 ENCOUNTER — Other Ambulatory Visit: Payer: Self-pay

## 2014-06-10 DIAGNOSIS — Z1231 Encounter for screening mammogram for malignant neoplasm of breast: Secondary | ICD-10-CM

## 2014-06-27 ENCOUNTER — Ambulatory Visit
Admission: RE | Admit: 2014-06-27 | Discharge: 2014-06-27 | Disposition: A | Discharge status: Home / Self Care | Payer: Medicare Other | Source: Ambulatory Visit

## 2014-06-27 DIAGNOSIS — Z1231 Encounter for screening mammogram for malignant neoplasm of breast: Secondary | ICD-10-CM

## 2014-10-10 ENCOUNTER — Ambulatory Visit (INDEPENDENT_AMBULATORY_CARE_PROVIDER_SITE_OTHER): Payer: Medicare Other | Admitting: Internal Medicine

## 2014-10-10 ENCOUNTER — Encounter: Payer: Self-pay | Admitting: Internal Medicine

## 2014-10-10 VITALS — BP 136/84 | Temp 98.2°F | Ht 69.0 in | Wt 178.0 lb

## 2014-10-10 DIAGNOSIS — Z853 Personal history of malignant neoplasm of breast: Secondary | ICD-10-CM | POA: Diagnosis not present

## 2014-10-10 DIAGNOSIS — E039 Hypothyroidism, unspecified: Secondary | ICD-10-CM

## 2014-10-10 DIAGNOSIS — J449 Chronic obstructive pulmonary disease, unspecified: Secondary | ICD-10-CM

## 2014-10-10 DIAGNOSIS — Z0000 Encounter for general adult medical examination without abnormal findings: Secondary | ICD-10-CM | POA: Diagnosis not present

## 2014-10-10 DIAGNOSIS — Z79899 Other long term (current) drug therapy: Secondary | ICD-10-CM | POA: Diagnosis not present

## 2014-10-10 LAB — CBC WITH DIFFERENTIAL/PLATELET
Basophils Absolute: 0 10*3/uL (ref 0.0–0.1)
Basophils Relative: 0.7 % (ref 0.0–3.0)
Eosinophils Absolute: 0.3 10*3/uL (ref 0.0–0.7)
Eosinophils Relative: 5.2 % — ABNORMAL HIGH (ref 0.0–5.0)
HCT: 43.8 % (ref 36.0–46.0)
HEMOGLOBIN: 14.7 g/dL (ref 12.0–15.0)
Lymphocytes Relative: 34.4 % (ref 12.0–46.0)
Lymphs Abs: 1.9 10*3/uL (ref 0.7–4.0)
MCHC: 33.6 g/dL (ref 30.0–36.0)
MCV: 90.1 fl (ref 78.0–100.0)
Monocytes Absolute: 0.3 10*3/uL (ref 0.1–1.0)
Monocytes Relative: 5.5 % (ref 3.0–12.0)
NEUTROS ABS: 3 10*3/uL (ref 1.4–7.7)
NEUTROS PCT: 54.2 % (ref 43.0–77.0)
Platelets: 193 10*3/uL (ref 150.0–400.0)
RBC: 4.86 Mil/uL (ref 3.87–5.11)
RDW: 13.7 % (ref 11.5–15.5)
WBC: 5.5 10*3/uL (ref 4.0–10.5)

## 2014-10-10 LAB — BASIC METABOLIC PANEL
BUN: 17 mg/dL (ref 6–23)
CHLORIDE: 105 meq/L (ref 96–112)
CO2: 31 meq/L (ref 19–32)
Calcium: 10.3 mg/dL (ref 8.4–10.5)
Creatinine, Ser: 0.93 mg/dL (ref 0.40–1.20)
GFR: 63.37 mL/min (ref >60.00)
GLUCOSE: 84 mg/dL (ref 70–99)
POTASSIUM: 4.8 meq/L (ref 3.5–5.1)
Sodium: 140 mEq/L (ref 135–145)

## 2014-10-10 LAB — LIPID PANEL
CHOLESTEROL: 210 mg/dL — AB (ref 0–200)
HDL: 60.7 mg/dL (ref >39.00)
LDL Cholesterol: 130 mg/dL — ABNORMAL HIGH (ref 0–99)
NonHDL: 149.3
TRIGLYCERIDES: 96 mg/dL (ref 0.0–149.0)
Total CHOL/HDL Ratio: 3
VLDL: 19.2 mg/dL (ref 0.0–40.0)

## 2014-10-10 LAB — HEPATIC FUNCTION PANEL
ALK PHOS: 79 U/L (ref 39–117)
ALT: 28 U/L (ref 0–35)
AST: 24 U/L (ref 0–37)
Albumin: 4.3 g/dL (ref 3.5–5.2)
Bilirubin, Direct: 0.1 mg/dL (ref 0.0–0.3)
TOTAL PROTEIN: 7.1 g/dL (ref 6.0–8.3)
Total Bilirubin: 0.5 mg/dL (ref 0.2–1.2)

## 2014-10-10 LAB — TSH: TSH: 0.71 u[IU]/mL (ref 0.35–4.50)

## 2014-10-10 MED ORDER — PAROXETINE HCL 40 MG PO TABS: ORAL_TABLET | ORAL | Status: DC

## 2014-10-10 MED ORDER — SYNTHROID 88 MCG PO TABS: ORAL_TABLET | ORAL | Status: DC

## 2014-10-10 NOTE — Progress Notes (Signed)
Pre visit review using our clinic review tool, if applicable. No additional management support is needed unless otherwise documented below in the visit note.  Chief Complaint  Patient presents with  . Medicare Wellness    medication management    HPI: Jasmine Neal 69 y.o. comes in today for Preventive Medicare wellness visit . No major injuries, ed visits ,, new medications since last visit. Had cataract surgery   COPD  : Fine with inhalers   Dr Lamonte Sakai.    Then [name]   recently .dosent think progressing at this time   Husband glioblastoma onset fall [place] and [facility] .  For 7 months .stressful coping  Health Maintenance  Topic Date Due  . INFLUENZA VACCINE  02/09/2015  . MAMMOGRAM  06/27/2016  . TETANUS/TDAP  08/11/2016  . COLONOSCOPY  05/14/2023  . DEXA SCAN  Completed  . ZOSTAVAX  Completed  . PNA vac Low Risk Adult  Completed   Health Maintenance Review LIFESTYLE:  Exercise:  recumbent bike Tobacco/ETS:no Alcohol: per day  Wine  One per day  Sugar beverages:  Not routine  Sleep:  About 7-8 hours  Drug use: no Bone density:  Colonoscopy:  utd MEDICARE DOCUMENT QUESTIONS  TO SCAN   Hearing:  Normal   Vision:  No limitations at present . Last eye check UTD  Safety:  Has smoke detector and wears seat belts.  No firearms. No excess sun exposure. Sees dentist regularly.  Falls:   Advance directive :  Reviewed  Has one.  Memory: Felt to be good  , no concern from her or her family.  Depression: No anhedonia unusual crying or depressive symptoms except related to her husbands illness but not  Hopeless depressed  Nutrition: Eats well balanced diet; adequate calcium and vitamin D. No swallowing chewing problems.  Injury: no major injuries in the last six months.  Other healthcare providers:  Reviewed today .  Social:  Lives with spouse married. No pets.   Preventive parameters: up-to-date  Reviewed   ADLS:   There are no problems or need for  assistance  driving, feeding, obtaining food, dressing, toileting and bathing, managing money using phone. She is independent.   ROS:  GEN/ HEENT: No fever, significant weight changes sweats headaches vision problems hearing changes, CV/ PULM; No chest pain shortness of breath cough, syncope,edema  change in exercise tolerance. GI /GU: No adominal pain, vomiting, change in bowel habits. No blood in the stool. No significant GU symptoms. SKIN/HEME: ,no acute skin rashes suspicious lesions or bleeding. No lymphadenopathy, nodules, masses.  NEURO/ PSYCH:  No neurologic signs such as weakness numbness. No depression anxiety. IMM/ Allergy: No unusual infections.  Allergy .   REST of 12 system review negative except as per HPI   Past Medical History  Diagnosis Date  . Chronic airway obstruction, not elsewhere classified   . Osteoarthritis   . Hypothyroidism   . Fibromyalgia   . Breast cancer     hx of right lumpectomy, 1990 IIA radiation cmfp ajunct rx  . Dysplasia     hx treated with cerv conization  . Abnormal GTT (glucose tolerance test)   . Fatty liver     US done 2010  . Asthma     evalutated by pulmonary severe by spirometry no smoker currently  . Hyperlipidemia   . Hx of nonmelanoma skin cancer      followed dr Martinique     Family History  Problem Relation Age  of Onset  . Osteoporosis    . Thyroid disease    . Breast cancer Mother 70    possible uterine cancer diagnosis at age 35  . Osteoporosis Mother     no hip fracture  . Breast cancer Sister 25  . Lung cancer Sister 43    former smoker  . Other Father     mysthenia gravis    History   Social History  . Marital Status: Married    Spouse Name: N/A  . Number of Children: N/A  . Years of Education: N/A   Occupational History  . Retired Pharmacist, hospital    Social History Main Topics  . Smoking status: Former Smoker -- 0.80 packs/day for 10 years    Types: Cigarettes    Quit date: 07/11/1974  . Smokeless tobacco:  Never Used  . Alcohol Use: 3.5 oz/week    7 drink(s) per week     Comment: 1 glass of red wine nightly and socially  . Drug Use: No  . Sexual Activity:    Partners: Male   Other Topics Concern  . None   Social History Narrative   Retired Pharmacist, hospital    hh of 2     Quit smoking 30 + years ago. 20 year pack year smoking hx   Regular exercise- some  treadmill    8 hours sleep   No current pets       Caffeine use: 1-2 cups of coffee/[employer] instant   Husband dx glioblastoma fall 14    Outpatient Encounter Prescriptions as of 10/10/2014  Medication Sig  . acetaminophen (TYLENOL) 500 MG tablet Take 1,000 mg by mouth every 6 (six) hours as needed. pain   . aspirin 81 MG tablet Take 81 mg by mouth daily.  . cholecalciferol (VITAMIN D) 1000 UNITS tablet Take 1,000 Units by mouth daily.  . Cyanocobalamin (B-12 PO) Take 1,500 mg by mouth daily.  . diphenhydramine-acetaminophen (TYLENOL PM) 25-500 MG TABS Take 1 tablet by mouth at bedtime as needed. sleep  . mometasone (ASMANEX) 220 MCG/INH inhaler Inhale 2 puffs into the lungs daily.  Marland Kitchen PARoxetine (PAXIL) 40 MG tablet Take 1 tablet by mouth   every night at bedtime  . SYNTHROID 88 MCG tablet Take 1 tablet by mouth  daily  . Umeclidinium-Vilanterol (ANORO ELLIPTA) 62.5-25 MCG/INH AEPB Inhale 1 puff into the lungs daily.  . [DISCONTINUED] PARoxetine (PAXIL) 40 MG tablet Take 1 tablet by mouth   every night at bedtime  . [DISCONTINUED] SYNTHROID 88 MCG tablet Take 1 tablet by mouth  daily  . [DISCONTINUED] Cholecalciferol (VITAMIN D3) 2000 UNITS TABS Take 2,000 Units by mouth daily.    EXAM:  BP 136/84 mmHg  Temp(Src) 98.2 F (36.8 C) (Oral)  Ht $R'5\' 9"'tR$  (1.753 m)  Wt 178 lb (80.74 kg)  BMI 26.27 kg/m2  Body mass index is 26.27 kg/(m^2).  Physical Exam: Vital signs reviewed YBW:LSLH is a well-developed well-nourished alert cooperative   who appears stated age in no acute distress.  HEENT: normocephalic atraumatic , Eyes: PERRL EOM's  full, conjunctiva clear, Nares: paten,t no deformity discharge or tenderness., Ears: no deformity EAC's clear TMs with normal landmarks. Mouth: clear OP, no lesions, edema.  Moist mucous membranes. Dentition in adequate repair. NECK: supple without masses, thyromegaly or bruits. CHEST/PULM:  Clear to auscultation and percussion breath sounds equal no wheeze , rales or rhonchi. No chest wall deformities or tenderness. CV: PMI is nondisplaced, S1 S2 no gallops, murmurs, rubs. Peripheral pulses are  full without delay.No JVD .  Breast  Right rx changes  No masses left  No nodules axilla clear ABDOMEN: Bowel sounds normal nontender  No guard or rebound, no hepato splenomegal no CVA tenderness.   Extremtities:  No clubbing cyanosis or edema, no acute joint swelling or redness no focal atrophy NEURO:  Oriented x3, cranial nerves 3-12 appear to be intact, no obvious focal weakness,gait within normal limits no abnormal reflexes or asymmetrical SKIN: No acute rashes normal turgor, color, no bruising or petechiae. PSYCH: Oriented, good eye contact, no obvious depression anxiety, cognition and judgment appear normal. LN: no cervical axillary inguinal adenopathy No noted deficits in memory, attention, and speech.  ASSESSMENT AND PLAN:  Discussed the following assessment and plan:  Visit for preventive health examination - Plan: Basic metabolic panel, CBC with Differential/Platelet, Hepatic function panel, Lipid panel, TSH  BREAST CANCER, HX OF - over 20 years out neg brca gene but srong fam hx has one risk genotype - Plan: Basic metabolic panel, CBC with Differential/Platelet, Hepatic function panel, Lipid panel, TSH  COPD, moderate - kislow following stable by report - Plan: Basic metabolic panel, CBC with Differential/Platelet, Hepatic function panel, Lipid panel, TSH  Medication management - continue  paxil helping and thyroid replacement - Plan: Basic metabolic panel, CBC with Differential/Platelet,  Hepatic function panel, Lipid panel, TSH  Hypothyroidism, unspecified hypothyroidism type Labs today prevention .anticipatory  Discussed  Patient Care Team: Burnis Medin, MD as PCP - General Jerrell Belfast, MD Amy Martinique, MD as Consulting Physician (Dermatology) Jiles Prows, MD as Consulting Physician (Allergy and Immunology) Marygrace Drought, MD as Consulting Physician (Ophthalmology)  Patient Instructions  Continue lifestyle intervention healthy eating and exercise . Get your mammogram as doing. Make sure dr Carmelina Peal send [place] copy of your visits with him. Ask about lung cancer screening advisabilty  Will notify you  of labs when available.  Healthy lifestyle includes : At least 150 minutes of exercise weeks  , weight at healthy levels, which is usually   BMI 19-25. Avoid trans fats and processed foods;  Increase fresh fruits and veges to 5 servings per day. And avoid sweet beverages including tea and juice. Mediterranean diet with olive oil and nuts have been noted to be heart and brain healthy . Avoid tobacco products . Limit  alcohol to  7 per week for women and 14 servings for men.  Get adequate sleep . Wear seat belts . Don't text and drive .       Standley Brooking. Rivan Siordia M.D.

## 2014-10-10 NOTE — Patient Instructions (Addendum)
Continue lifestyle intervention healthy eating and exercise . Get your mammogram as doing. Make sure dr Carmelina Peal send [place] copy of your visits with him. Ask about lung cancer screening advisabilty  Will notify you  of labs when available.  Healthy lifestyle includes : At least 150 minutes of exercise weeks  , weight at healthy levels, which is usually   BMI 19-25. Avoid trans fats and processed foods;  Increase fresh fruits and veges to 5 servings per day. And avoid sweet beverages including tea and juice. Mediterranean diet with olive oil and nuts have been noted to be heart and brain healthy . Avoid tobacco products . Limit  alcohol to  7 per week for women and 14 servings for men.  Get adequate sleep . Wear seat belts . Don't text and drive .

## 2014-10-11 ENCOUNTER — Encounter: Payer: Self-pay | Admitting: Internal Medicine

## 2014-10-11 DIAGNOSIS — E039 Hypothyroidism, unspecified: Secondary | ICD-10-CM | POA: Insufficient documentation

## 2015-05-26 ENCOUNTER — Other Ambulatory Visit: Payer: Self-pay

## 2015-05-26 DIAGNOSIS — Z1231 Encounter for screening mammogram for malignant neoplasm of breast: Secondary | ICD-10-CM

## 2015-05-26 DIAGNOSIS — Z853 Personal history of malignant neoplasm of breast: Secondary | ICD-10-CM

## 2015-06-30 ENCOUNTER — Ambulatory Visit
Admission: RE | Admit: 2015-06-30 | Discharge: 2015-06-30 | Disposition: A | Discharge status: Home / Self Care | Payer: Medicare Other | Source: Ambulatory Visit

## 2015-06-30 DIAGNOSIS — Z1231 Encounter for screening mammogram for malignant neoplasm of breast: Secondary | ICD-10-CM

## 2015-06-30 DIAGNOSIS — Z853 Personal history of malignant neoplasm of breast: Secondary | ICD-10-CM

## 2015-07-16 ENCOUNTER — Telehealth: Payer: Self-pay | Admitting: Internal Medicine

## 2015-07-16 NOTE — Telephone Encounter (Signed)
She was sick for a week over Christmas and was well for 3 days and this past Tuesday she woke up with a sorethroat couginh up green mucus regularly. Was wantingto know if she needs to be seen or can you call her in a Rx. If you can call in something, to have you send it to : [id number] - [place], [place] AT [address]

## 2015-07-16 NOTE — Telephone Encounter (Signed)
Please schedule the pt to be seen.  Thanks!

## 2015-07-17 ENCOUNTER — Other Ambulatory Visit: Payer: Self-pay | Admitting: Internal Medicine

## 2015-07-17 ENCOUNTER — Ambulatory Visit: Payer: Medicare Other | Admitting: Internal Medicine

## 2015-07-17 ENCOUNTER — Ambulatory Visit (INDEPENDENT_AMBULATORY_CARE_PROVIDER_SITE_OTHER): Payer: Medicare Other | Admitting: Internal Medicine

## 2015-07-17 ENCOUNTER — Encounter: Payer: Self-pay | Admitting: Internal Medicine

## 2015-07-17 VITALS — BP 156/80 | HR 103 | Temp 99.3°F | Ht 69.0 in | Wt 179.7 lb

## 2015-07-17 DIAGNOSIS — J22 Unspecified acute lower respiratory infection: Secondary | ICD-10-CM

## 2015-07-17 DIAGNOSIS — J988 Other specified respiratory disorders: Secondary | ICD-10-CM | POA: Diagnosis not present

## 2015-07-17 DIAGNOSIS — J449 Chronic obstructive pulmonary disease, unspecified: Secondary | ICD-10-CM | POA: Diagnosis not present

## 2015-07-17 DIAGNOSIS — Z79899 Other long term (current) drug therapy: Secondary | ICD-10-CM

## 2015-07-17 MED ORDER — AZITHROMYCIN 250 MG PO TABS: ORAL_TABLET | ORAL | Status: DC

## 2015-07-17 NOTE — Patient Instructions (Signed)
Exam is reassuring today.   But since this may be relapsing infection  And you have lung disease .  Add antibiotic  As discussed.   Expect  Improvement in the next 5 days or  Less . Cough could last another 1-2 weeks though  Stay on you inhalers .

## 2015-07-17 NOTE — Telephone Encounter (Signed)
Sent to the pharmacy by e-scribe. 
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
## 2015-08-12 ENCOUNTER — Other Ambulatory Visit: Payer: Self-pay | Admitting: Allergy and Immunology

## 2015-09-30 ENCOUNTER — Telehealth: Payer: Self-pay | Admitting: *Deleted

## 2015-09-30 NOTE — Telephone Encounter (Signed)
"  I need to make an appointment with a breast cancer Oncologist.  My husband Mya Reum d.o.b. was 09-20-1943 was a patient of Dr. Alvy Bimler.  She told me she would set me up with a provider and I am now ready to be seen.  I'm a 25 years out and would feel better having a F/U. I can be reached at [phone]."  Advised we normally have referrals from PCP for new patient's but she says she "was told she would be set up with a provider.  I'm not having any problems.  If there's something else I need to do, let me know.  I called my PCP and can't be seen until August."    Will notify H.I.M. And provider of this request.

## 2015-10-01 NOTE — Telephone Encounter (Signed)
New patient coordinator notified

## 2015-10-01 NOTE — Telephone Encounter (Signed)
This would be self referral. She can be seen routine by any breast oncologist

## 2015-10-27 ENCOUNTER — Other Ambulatory Visit: Payer: Self-pay | Admitting: Internal Medicine

## 2015-10-28 NOTE — Telephone Encounter (Signed)
Sent to the pharmacy by e-scribe. 

## 2015-11-13 ENCOUNTER — Other Ambulatory Visit: Payer: Self-pay | Admitting: Allergy and Immunology

## 2015-11-24 ENCOUNTER — Ambulatory Visit: Payer: Self-pay | Admitting: Allergy and Immunology

## 2015-12-02 ENCOUNTER — Encounter: Payer: Self-pay | Admitting: Allergy and Immunology

## 2015-12-02 ENCOUNTER — Ambulatory Visit (INDEPENDENT_AMBULATORY_CARE_PROVIDER_SITE_OTHER): Payer: Medicare Other | Admitting: Allergy and Immunology

## 2015-12-02 VITALS — BP 138/88 | HR 78 | Resp 18 | Ht 68.5 in | Wt 174.8 lb

## 2015-12-02 DIAGNOSIS — J449 Chronic obstructive pulmonary disease, unspecified: Secondary | ICD-10-CM | POA: Diagnosis not present

## 2015-12-02 DIAGNOSIS — J45909 Unspecified asthma, uncomplicated: Secondary | ICD-10-CM | POA: Diagnosis not present

## 2015-12-02 NOTE — Patient Instructions (Addendum)
  1. Continue Anoro one inhalation 1 time per day  2. Continue Asmanex 220 2 inhalations one time per day  3. Continue Ventolin HFA 2 puffs every 4-6 hours if needed  4. Obtain fall flu vaccine  5. Return to clinic in 12 months or earlier if problem

## 2015-12-02 NOTE — Progress Notes (Signed)
Follow-up Note  Referring Provider: Burnis Medin, MD Primary Provider: Lottie Dawson, MD Date of Office Visit: 12/02/2015  Subjective:   Jasmine Neal (DOB: [birth year]) is a 70 y.o. female who returns to the [facility] on 12/02/2015 in re-evaluation of the following:  HPI: Jasmine Neal returns to this clinic in reevaluation of her obstructive lung disease with component of asthma. I've not seen her in his clinic since January 2016. Overall she is done very well and has not required a systemic steroid to treat an exacerbation of this condition. She still has a limitation in her ability to exercise. She does not use a short acting bronchodilator. If she misses her medications for a few weeks she is much worse.    Medication List           acetaminophen 500 MG tablet  Commonly known as:  TYLENOL  Take 1,000 mg by mouth every 6 (six) hours as needed. pain     ANORO ELLIPTA 62.5-25 MCG/INH Aepb  Generic drug:  umeclidinium-vilanterol  Use one inhalation every  morning , Rinse mouth after use     ASMANEX 60 METERED DOSES 220 MCG/INH inhaler  Generic drug:  mometasone  USE 2 PUFFS ONCE DAILY TO  PREVENT COUGH OR WHEEZING.  RINSE, GARGLE AND SPIT  AFTER USE     aspirin 81 MG tablet  Take 81 mg by mouth daily.     cholecalciferol 1000 units tablet  Commonly known as:  VITAMIN D  Take 1,000 Units by mouth daily.     diphenhydramine-acetaminophen 25-500 MG Tabs tablet  Commonly known as:  TYLENOL PM  Take 1 tablet by mouth at bedtime as needed. sleep     PARoxetine 40 MG tablet  Commonly known as:  PAXIL  Take 1 tablet by mouth  every night at bedtime     SYNTHROID 88 MCG tablet  Generic drug:  levothyroxine  Take 1 tablet by mouth  daily     valACYclovir 1000 MG tablet  Commonly known as:  VALTREX  TAKE 2 TABLETS BY MOUTH TWICE DAILY AS DIRECTED        Past Medical History  Diagnosis Date  . Chronic airway obstruction, not elsewhere classified     . Osteoarthritis   . Hypothyroidism   . Fibromyalgia   . Breast cancer ([place])     hx of right lumpectomy, 1990 IIA radiation cmfp ajunct rx  . Dysplasia     hx treated with cerv conization  . Abnormal GTT (glucose tolerance test)   . Fatty liver     US done 2010  . Asthma     evalutated by pulmonary severe by spirometry no smoker currently  . Hyperlipidemia   . Hx of nonmelanoma skin cancer      followed dr Martinique     Past Surgical History  Procedure Laterality Date  . Nasal sinus surgery      on right done by Dr. Wilburn Cornelia  . Breast lumpectomy      on right, 1990 IIA radiation cmfp ajuction rx  . Knee surgery      Allergies  Allergen Reactions  . Hydrocodone Itching    Face swelled up    Review of systems negative except as noted in HPI / PMHx or noted below:  Review of Systems  Constitutional: Negative.   HENT: Negative.   Eyes: Negative.   Respiratory: Negative.   Cardiovascular: Negative.   Gastrointestinal: Negative.   Genitourinary: Negative.  Musculoskeletal: Negative.   Skin: Negative.   Neurological: Negative.   Endo/Heme/Allergies: Negative.   Psychiatric/Behavioral: Negative.      Objective:   Filed Vitals:   12/02/15 1045  BP: 138/88  Pulse: 78  Resp: 18   Height: 5' 8.5" (174 cm)  Weight: 174 lb 12.8 oz (79.289 kg)   Physical Exam  Constitutional: She is well-developed, well-nourished, and in no distress.  HENT:  Head: Normocephalic.  Right Ear: Tympanic membrane, external ear and ear canal normal.  Left Ear: Tympanic membrane, external ear and ear canal normal.  Nose: Nose normal. No mucosal edema or rhinorrhea.  Mouth/Throat: Uvula is midline, oropharynx is clear and moist and mucous membranes are normal. No oropharyngeal exudate.  Eyes: Conjunctivae are normal.  Neck: Trachea normal. No tracheal tenderness present. No tracheal deviation present. No thyromegaly present.  Cardiovascular: Normal rate, regular rhythm, S1 normal, S2  normal and normal heart sounds.   No murmur heard. Pulmonary/Chest: Breath sounds normal. No stridor. No respiratory distress. She has no wheezes. She has no rales.  Musculoskeletal: She exhibits no edema.  Lymphadenopathy:       Head (right side): No tonsillar adenopathy present.       Head (left side): No tonsillar adenopathy present.    She has no cervical adenopathy.  Neurological: She is alert. Gait normal.  Skin: No rash noted. She is not diaphoretic. No erythema. Nails show no clubbing.  Psychiatric: Mood and affect normal.    Diagnostics:    Spirometry was performed and demonstrated an FEV1 of 1.19 at 47 % of predicted.  The patient had an Asthma Control Test with the following results:  .    Assessment and Plan:   1. COPD with asthma ([place])     1. Continue Anoro one inhalation 1 time per day  2. Continue Asmanex 220 2 inhalations one time per day  3. Continue Ventolin HFA 2 puffs every 4-6 hours if needed  4. Obtain fall flu vaccine  5. Return to clinic in 12 months or earlier if problem  Jasmine Neal appears to be doing relatively well at this point in time and I see no need for changing her therapy. She'll continue on anti-inflammatory therapy along with a long-acting bronchodilator and anticholinergic agent delivered to her respiratory tract and we'll see her back in this clinic in approximately 12 months or earlier if there is a problem.  Allena Katz, MD [place]

## 2015-12-06 ENCOUNTER — Other Ambulatory Visit: Payer: Self-pay | Admitting: Allergy and Immunology

## 2015-12-25 ENCOUNTER — Other Ambulatory Visit: Payer: Self-pay | Admitting: Allergy and Immunology

## 2016-02-12 ENCOUNTER — Other Ambulatory Visit (INDEPENDENT_AMBULATORY_CARE_PROVIDER_SITE_OTHER): Payer: Medicare Other

## 2016-02-12 DIAGNOSIS — Z0000 Encounter for general adult medical examination without abnormal findings: Secondary | ICD-10-CM | POA: Diagnosis not present

## 2016-02-12 LAB — BASIC METABOLIC PANEL
BUN: 15 mg/dL (ref 6–23)
CALCIUM: 9.8 mg/dL (ref 8.4–10.5)
CHLORIDE: 104 meq/L (ref 96–112)
CO2: 30 mEq/L (ref 19–32)
CREATININE: 0.91 mg/dL (ref 0.40–1.20)
GFR: 64.73 mL/min (ref >60.00)
Glucose, Bld: 90 mg/dL (ref 70–99)
Potassium: 4.2 mEq/L (ref 3.5–5.1)
Sodium: 140 mEq/L (ref 135–145)

## 2016-02-12 LAB — CBC WITH DIFFERENTIAL/PLATELET
BASOS ABS: 0 10*3/uL (ref 0.0–0.1)
Basophils Relative: 0.7 % (ref 0.0–3.0)
Eosinophils Absolute: 0.4 10*3/uL (ref 0.0–0.7)
Eosinophils Relative: 6.2 % — ABNORMAL HIGH (ref 0.0–5.0)
HEMATOCRIT: 41.3 % (ref 36.0–46.0)
HEMOGLOBIN: 13.8 g/dL (ref 12.0–15.0)
LYMPHS PCT: 35.3 % (ref 12.0–46.0)
Lymphs Abs: 2.2 10*3/uL (ref 0.7–4.0)
MCHC: 33.3 g/dL (ref 30.0–36.0)
MCV: 90.8 fl (ref 78.0–100.0)
MONO ABS: 0.5 10*3/uL (ref 0.1–1.0)
Monocytes Relative: 8.7 % (ref 3.0–12.0)
Neutro Abs: 3 10*3/uL (ref 1.4–7.7)
Neutrophils Relative %: 49.1 % (ref 43.0–77.0)
Platelets: 250 10*3/uL (ref 150.0–400.0)
RBC: 4.55 Mil/uL (ref 3.87–5.11)
RDW: 13.7 % (ref 11.5–15.5)
WBC: 6.1 10*3/uL (ref 4.0–10.5)

## 2016-02-12 LAB — LIPID PANEL
CHOL/HDL RATIO: 3
Cholesterol: 185 mg/dL (ref 0–200)
HDL: 53 mg/dL (ref >39.00)
LDL Cholesterol: 119 mg/dL — ABNORMAL HIGH (ref 0–99)
NonHDL: 132.39
Triglycerides: 68 mg/dL (ref 0.0–149.0)
VLDL: 13.6 mg/dL (ref 0.0–40.0)

## 2016-02-12 LAB — HEPATIC FUNCTION PANEL
ALK PHOS: 182 U/L — AB (ref 39–117)
ALT: 37 U/L — AB (ref 0–35)
AST: 20 U/L (ref 0–37)
Albumin: 4 g/dL (ref 3.5–5.2)
Bilirubin, Direct: 0 mg/dL (ref 0.0–0.3)
TOTAL PROTEIN: 7 g/dL (ref 6.0–8.3)
Total Bilirubin: 0.5 mg/dL (ref 0.2–1.2)

## 2016-02-12 LAB — TSH: TSH: 1.05 u[IU]/mL (ref 0.35–4.50)

## 2016-02-15 NOTE — Progress Notes (Signed)
Pre visit review using our clinic review tool, if applicable. No additional management support is needed unless otherwise documented below in the visit note.  Chief Complaint  Patient presents with  . Medicare Wellness    HPI: Jasmine Neal 70 y.o. comes in today for Preventive Medicare wellness visit .Since last visit.  husband passed glioblastoma  Fall of 2016 Doing ok good support system  No change of resp function   On inhlaers per dr Carmelina Peal  Moods stable needs refill on paxil as bests possible  Thyroid taking daily no change hair skin  Take vit d 1000 per day Breast cancer right  Hx   25 years out but some concern about breast area  And feels time for a follo wup but needs a referral as prev oncologist is gone   Health Maintenance  Topic Date Due  . INFLUENZA VACCINE  02/09/2016  . TETANUS/TDAP  08/11/2016  . MAMMOGRAM  06/29/2017  . COLONOSCOPY  05/14/2023  . DEXA SCAN  Completed  . ZOSTAVAX  Completed  . Hepatitis C Screening  Completed  . PNA vac Low Risk Adult  Completed   Health Maintenance Review LIFESTYLE:  TAD neg td etoh 7-14 per week  Sugar beverages:n Sleep: ok hh of 1 has a puppy now   MEDICARE DOCUMENT QUESTIONS  TO SCAN   Hearing: some dec not effecting life   Vision:  No limitations at present . Last eye check UTD  Safety:  Has smoke detector and wears seat belts.  No firearms. No excess sun exposure. Sees dentist regularly.  Falls: n  Advance directive :  Reviewed  Has one.  Memory: Felt to be good  , no concern from her or her family.  Depression: No anhedonia unusual crying or depressive symptoms  Nutrition: Eats well balanced diet; adequate calcium and vitamin D. No swallowing chewing problems.  Injury: no major injuries in the last six months.  Other healthcare providers:  Reviewed today .  Social:  Widowed  Pet puppy now  Preventive parameters: up-to-date  Reviewed   ADLS:   There are no problems or need for assistance   driving, feeding, obtaining food, dressing, toileting and bathing, managing money using phone. She is independent.    ROS:  constipated after trip to [place]  6 weeks ago but better but had some illness?  GEN/ HEENT: No fever, significant weight changes sweats headaches vision problems hearing changes, CV/ PULM; No chest pain shortness of breath cough, syncope,edema  change in exercise tolerance. GI /GU: No adominal pain, vomiting, . No blood in the stool. No significant GU symptoms. SKIN/HEME: ,no acute skin rashes suspicious lesions or bleeding. No lymphadenopathy, nodules, masses.  NEURO/ PSYCH:  No neurologic signs such as weakness numbness. No depression anxiety. IMM/ Allergy: No unusual infections.  Allergy .   REST of 12 system review negative except as per HPI   Past Medical History:  Diagnosis Date  . Abnormal GTT (glucose tolerance test)   . Asthma    evalutated by pulmonary severe by spirometry no smoker currently  . Breast cancer ([place])    hx of right lumpectomy, 1990 IIA radiation cmfp ajunct rx  . Chronic airway obstruction, not elsewhere classified   . Dysplasia    hx treated with cerv conization  . Fatty liver    US done 2010  . Fibromyalgia   . Hx of nonmelanoma skin cancer     followed dr Martinique   . Hyperlipidemia   . Hypothyroidism   .  Osteoarthritis     Family History  Problem Relation Age of Onset  . Osteoporosis    . Thyroid disease    . Breast cancer Mother 88    possible uterine cancer diagnosis at age 34  . Osteoporosis Mother     no hip fracture  . Breast cancer Sister 59  . Lung cancer Sister 39    former smoker  . Other Father     mysthenia gravis    Social History   Social History  . Marital status: Married    Spouse name: N/A  . Number of children: N/A  . Years of education: N/A   Occupational History  . Retired Pharmacist, hospital    Social History Main Topics  . Smoking status: Former Smoker    Packs/day: 0.80    Years: 10.00    Types:  Cigarettes    Quit date: 07/11/1974  . Smokeless tobacco: Never Used  . Alcohol use 3.5 oz/week    7 Standard drinks or equivalent per week     Comment: 1 glass of red wine nightly and socially  . Drug use: No  . Sexual activity: Yes    Partners: Male   Other Topics Concern  . None   Social History Narrative   Retired Pharmacist, hospital    hh of 1   Now has [employer] smoking 30 + years ago. 20 year pack year smoking hx   Regular exercise- some  treadmill    8 hours sleep         Caffeine use: 1-2 cups of coffee/[employer] instant   Husband dx glioblastoma fall 15   Passed away fall 11-07-2014    Outpatient Encounter Prescriptions as of 02/16/2016  Medication Sig  . acetaminophen (TYLENOL) 500 MG tablet Take 1,000 mg by mouth every 6 (six) hours as needed. pain   . ANORO ELLIPTA 62.5-25 MCG/INH AEPB USE ONE INHALATION EVERY  MORNING , RINSE MOUTH AFTER USE  . ASMANEX 60 METERED DOSES 220 MCG/INH inhaler USE 2 INHALATIONS ONCE  DAILY TO PREVENT COUGH OR  WHEEZING. RINSE, GARGLE AND SPIT AFTER USE  . aspirin 81 MG tablet Take 81 mg by mouth daily.  . cholecalciferol (VITAMIN D) 1000 UNITS tablet Take 1,000 Units by mouth daily.  . diphenhydramine-acetaminophen (TYLENOL PM) 25-500 MG TABS Take 1 tablet by mouth at bedtime as needed. sleep  . PARoxetine (PAXIL) 40 MG tablet Take 1 tablet (40 mg total) by mouth at bedtime.  Marland Kitchen SYNTHROID 88 MCG tablet Take 1 tablet (88 mcg total) by mouth daily.  . valACYclovir (VALTREX) 1000 MG tablet TAKE 2 TABLETS BY MOUTH TWICE DAILY AS DIRECTED  . [DISCONTINUED] PARoxetine (PAXIL) 40 MG tablet Take 1 tablet by mouth  every night at bedtime  . [DISCONTINUED] SYNTHROID 88 MCG tablet Take 1 tablet by mouth  daily   No facility-administered encounter medications on file as of 02/16/2016.     EXAM:  BP 130/80   Temp 98.1 F (36.7 C) (Oral)   Ht 5\' 9"  (1.753 m)   Wt 166 lb 12.8 oz (75.7 kg)   BMI 24.63 kg/m   Body mass index is 24.63 kg/m.  Physical  Exam: Vital signs reviewed [id number] is a well-developed well-nourished alert cooperative   who appears stated age in no acute distress.  HEENT: normocephalic atraumatic , Eyes: PERRL EOM's full, conjunctiva clear, Nares: paten,t no deformity discharge or tenderness., Ears: no deformity EAC's clear TMs with normal landmarks. Mouth: clear OP, no lesions,  edema.  Moist mucous membranes. Dentition in adequate repair. NECK: supple without masses, thyromegaly or bruits. CHEST/PULM:  Clear to auscultation and percussion breath sounds equal no wheeze , rales or rhonchi. No chest wall  or tenderness. CV: PMI is nondisplaced, S1 S2 no gallops, murmurs, rubs. Peripheral pulses are full without delay.No JVD .  right breast firm upper lateral  axill no adenopathy  Left normal no nodules  ABDOMEN: Bowel sounds normal nontender  No guard or rebound, no hepato splenomegal no CVA tenderness.   Extremtities:  No clubbing cyanosis or edema, no acute joint swelling or redness no focal atrophy NEURO:  Oriented x3, cranial nerves 3-12 appear to be intact, no obvious focal weakness,gait within normal limits no abnormal reflexes or asymmetrical SKIN: No acute rashes normal turgor, color, no bruising or petechiae. PSYCH: Oriented, good eye contact, no obvious depression anxiety, cognition and judgment appear normal. LN: no cervical axillary inguinal adenopathy No noted deficits in memory, attention, and speech.   Lab Results  Component Value Date   WBC 6.1 02/12/2016   HGB 13.8 02/12/2016   HCT 41.3 02/12/2016   PLT 250.0 02/12/2016   GLUCOSE 90 02/12/2016   CHOL 185 02/12/2016   TRIG 68.0 02/12/2016   HDL 53.00 02/12/2016   LDLDIRECT 137.0 09/11/2012   LDLCALC 119 (H) 02/12/2016   ALT 37 (H) 02/12/2016   AST 20 02/12/2016   NA 140 02/12/2016   K 4.2 02/12/2016   CL 104 02/12/2016   CREATININE 0.91 02/12/2016   BUN 15 02/12/2016   CO2 30 02/12/2016   TSH 1.05 02/12/2016   Lab Results  Component Value  Date   ALKPHOS 182 (H) 02/12/2016     ASSESSMENT AND PLAN:  Discussed the following assessment and plan:  Visit for preventive health examination  NONSPEC ELEVATION OF LEVELS OF TRANSAMINASE/LDH - remote hx of same neg eval in [place] new again check in 1 mnths liver vs bone source  no sx - Plan: Hepatic function panel, Gamma GT, VITAMIN D 25 Hydroxy (Vit-D Deficiency, Fractures), Nucleotidase, 5', blood  Hypothyroidism, unspecified hypothyroidism type  Medicare annual wellness visit, subsequent  COPD, moderate ([place]) - apparently stable sx   Medication management  HX: breast cancer - Plan: Ambulatory referral to Oncology  Elevated alkaline phosphatase level - Plan: Hepatic function panel, Gamma GT, VITAMIN D 25 Hydroxy (Vit-D Deficiency, Fractures), Nucleotidase, 5', blood To get flu vaccine when available  Patient Care Team: Burnis Medin, MD as PCP - General Jerrell Belfast, MD Amy Martinique, MD as Consulting Physician (Dermatology) Jiles Prows, MD as Consulting Physician (Allergy and Immunology) Marygrace Drought, MD as Consulting Physician (Ophthalmology) Clent Jacks, MD as Consulting Physician (Ophthalmology)  Patient Instructions  Labs in a month   Uncertain cause of lab tests can be liver irritation or bone    . Will plan fu depending on results .  Will do referral to  Oncology about breast cancer   Fu.    Healthy lifestyle includes : At least 150 minutes of exercise weeks  , weight at healthy levels, which is usually   BMI 19-25. Avoid trans fats and processed foods;  Increase fresh fruits and veges to 5 servings per day. And avoid sweet beverages including tea and juice. Mediterranean diet with olive oil and nuts have been noted to be heart and brain healthy . Avoid tobacco products . Limit  alcohol to  7 per week for women and 14 servings for men.  Get adequate sleep .  Wear seat belts . Don't text and drive .     Standley Brooking. Yarely Bebee M.D.

## 2016-02-16 ENCOUNTER — Ambulatory Visit (INDEPENDENT_AMBULATORY_CARE_PROVIDER_SITE_OTHER): Payer: Medicare Other | Admitting: Internal Medicine

## 2016-02-16 ENCOUNTER — Encounter: Payer: Self-pay | Admitting: Internal Medicine

## 2016-02-16 VITALS — BP 130/80 | Temp 98.1°F | Ht 69.0 in | Wt 166.8 lb

## 2016-02-16 DIAGNOSIS — R740 Nonspecific elevation of levels of transaminase and lactic acid dehydrogenase [LDH]: Secondary | ICD-10-CM | POA: Diagnosis not present

## 2016-02-16 DIAGNOSIS — Z79899 Other long term (current) drug therapy: Secondary | ICD-10-CM | POA: Diagnosis not present

## 2016-02-16 DIAGNOSIS — Z0000 Encounter for general adult medical examination without abnormal findings: Secondary | ICD-10-CM | POA: Diagnosis not present

## 2016-02-16 DIAGNOSIS — Z853 Personal history of malignant neoplasm of breast: Secondary | ICD-10-CM

## 2016-02-16 DIAGNOSIS — R7402 Elevation of levels of lactic acid dehydrogenase (LDH): Secondary | ICD-10-CM

## 2016-02-16 DIAGNOSIS — J449 Chronic obstructive pulmonary disease, unspecified: Secondary | ICD-10-CM

## 2016-02-16 DIAGNOSIS — E039 Hypothyroidism, unspecified: Secondary | ICD-10-CM

## 2016-02-16 DIAGNOSIS — R748 Abnormal levels of other serum enzymes: Secondary | ICD-10-CM

## 2016-02-16 MED ORDER — PAROXETINE HCL 40 MG PO TABS: 40.0000 mg | ORAL_TABLET | Freq: Every day | ORAL | 3 refills | Status: DC

## 2016-02-16 MED ORDER — SYNTHROID 88 MCG PO TABS: 88.0000 ug | ORAL_TABLET | Freq: Every day | ORAL | 3 refills | Status: DC

## 2016-02-16 NOTE — Patient Instructions (Signed)
Labs in a month   Uncertain cause of lab tests can be liver irritation or bone    . Will plan fu depending on results .  Will do referral to  Oncology about breast cancer   Fu.    Healthy lifestyle includes : At least 150 minutes of exercise weeks  , weight at healthy levels, which is usually   BMI 19-25. Avoid trans fats and processed foods;  Increase fresh fruits and veges to 5 servings per day. And avoid sweet beverages including tea and juice. Mediterranean diet with olive oil and nuts have been noted to be heart and brain healthy . Avoid tobacco products . Limit  alcohol to  7 per week for women and 14 servings for men.  Get adequate sleep . Wear seat belts . Don't text and drive .

## 2016-02-17 ENCOUNTER — Telehealth: Payer: Self-pay | Admitting: *Deleted

## 2016-02-17 NOTE — Telephone Encounter (Signed)
TC to pt in regards to new patient referral for Hx Breast Cancer   Breast cancer was over 25 years ago- advised she was clear and released from oncology care.  Took tamoxifen/rad chemo Hard/smaller tissue pt is mostly concerned about cosmetic appearance  Routine yearly screenings completed. Had mammo- [facility] last October with normal results  Plastic sx referral due to breast apperance Sx completed about 25 years ago. Pt has copies of these records.  Please advise if this patient would be appropriate for Survivorship or establishing care with an Oncology.

## 2016-02-26 NOTE — Telephone Encounter (Signed)
Late Entry Patient called office 02/25/16 to discuss referral. Pt is not interested in an oncology evaluation- all mammograms since her dx/sx have been normal. Pt is wanting a referral to a plastic surgeons office. She is interested in reconstruction surgery to remove scars. Pt was advised to call her PCP for a referral for insurance purposes.   Pt was also given Dr. Harlow Mares information and [facility] for consultation. Pt thanked [place] for the information and will call if any further needs arise.

## 2016-03-18 ENCOUNTER — Other Ambulatory Visit (INDEPENDENT_AMBULATORY_CARE_PROVIDER_SITE_OTHER): Payer: Medicare Other

## 2016-03-18 DIAGNOSIS — R740 Nonspecific elevation of levels of transaminase and lactic acid dehydrogenase [LDH]: Secondary | ICD-10-CM

## 2016-03-18 DIAGNOSIS — R748 Abnormal levels of other serum enzymes: Secondary | ICD-10-CM | POA: Diagnosis not present

## 2016-03-18 DIAGNOSIS — R7402 Elevation of levels of lactic acid dehydrogenase (LDH): Secondary | ICD-10-CM

## 2016-03-18 LAB — HEPATIC FUNCTION PANEL
ALT: 102 U/L — ABNORMAL HIGH (ref 0–35)
AST: 68 U/L — AB (ref 0–37)
Albumin: 4.1 g/dL (ref 3.5–5.2)
Alkaline Phosphatase: 89 U/L (ref 39–117)
BILIRUBIN TOTAL: 0.5 mg/dL (ref 0.2–1.2)
Bilirubin, Direct: 0.1 mg/dL (ref 0.0–0.3)
Total Protein: 6.6 g/dL (ref 6.0–8.3)

## 2016-03-18 LAB — VITAMIN D 25 HYDROXY (VIT D DEFICIENCY, FRACTURES): VITD: 38.18 ng/mL (ref 30.00–100.00)

## 2016-03-18 LAB — GAMMA GT: GGT: 152 U/L — AB (ref 7–51)

## 2016-03-22 ENCOUNTER — Telehealth: Payer: Self-pay | Admitting: Internal Medicine

## 2016-03-22 DIAGNOSIS — R7989 Other specified abnormal findings of blood chemistry: Secondary | ICD-10-CM

## 2016-03-22 DIAGNOSIS — R945 Abnormal results of liver function studies: Secondary | ICD-10-CM

## 2016-03-22 LAB — NUCLEOTIDASE, 5', BLOOD: 5-Nucleotidase: 11 U/L — ABNORMAL HIGH (ref 0–10)

## 2016-03-22 NOTE — Telephone Encounter (Signed)
Pt would like blood work results °

## 2016-03-23 NOTE — Telephone Encounter (Signed)
Apology for delay didn't have all the results until yesterday  See result notes  The liver transaminases  are more abnormal but  No dx obvious  I advise  Avoid alcohol if she hasnt already done this . Order and abdominal ultrasound  Dx abnomral lfts   To check for  spots on liver etc . Will plan on more lab tests  Depending on results  Of this test .

## 2016-03-24 ENCOUNTER — Encounter: Payer: Self-pay | Admitting: Internal Medicine

## 2016-03-24 NOTE — Telephone Encounter (Signed)
See result note.  

## 2016-03-24 NOTE — Addendum Note (Signed)
Addended by: Miles Costain T on: 03/24/2016 11:30 AM   Modules accepted: Orders

## 2016-04-01 NOTE — Telephone Encounter (Signed)
Misty I think I already answered this   Can close  This loop if that is true

## 2016-04-04 NOTE — Telephone Encounter (Signed)
Scheduled for 04/11/16

## 2016-04-11 ENCOUNTER — Ambulatory Visit
Admission: RE | Admit: 2016-04-11 | Discharge: 2016-04-11 | Disposition: A | Discharge status: Home / Self Care | Payer: Medicare Other | Source: Ambulatory Visit | Attending: Internal Medicine | Admitting: Internal Medicine

## 2016-04-11 DIAGNOSIS — R945 Abnormal results of liver function studies: Secondary | ICD-10-CM

## 2016-04-11 DIAGNOSIS — R7989 Other specified abnormal findings of blood chemistry: Secondary | ICD-10-CM

## 2016-04-13 ENCOUNTER — Telehealth: Payer: Self-pay | Admitting: Internal Medicine

## 2016-04-13 NOTE — Telephone Encounter (Signed)
Pt had abd  ultrasound at [facility] on Monday and would like results

## 2016-04-14 NOTE — Telephone Encounter (Signed)
   Results note    Ultrasound Liver No masses Some changes seen with inflammation of liver  This is of uncertain significance  But since you have abnormal Blood tests.  We can proceed with further imaging .Marland Kitchen..hepatic MRI  If you agree .   Also advise refer you to GI  About  There may be other Blood tests that May need to be done in this work up   Please arrange for  Mri hepatic  Dx abnormal lfts abnormal US liver   And GI referral if patient agrees

## 2016-04-14 NOTE — Telephone Encounter (Signed)
See result note.  

## 2016-04-22 ENCOUNTER — Ambulatory Visit (INDEPENDENT_AMBULATORY_CARE_PROVIDER_SITE_OTHER): Payer: Medicare Other | Admitting: Internal Medicine

## 2016-04-22 ENCOUNTER — Other Ambulatory Visit: Payer: Self-pay | Admitting: Family Medicine

## 2016-04-22 ENCOUNTER — Encounter: Payer: Self-pay | Admitting: Internal Medicine

## 2016-04-22 VITALS — BP 142/70 | Temp 98.1°F | Ht 69.0 in | Wt 161.2 lb

## 2016-04-22 DIAGNOSIS — N6459 Other signs and symptoms in breast: Secondary | ICD-10-CM | POA: Diagnosis not present

## 2016-04-22 DIAGNOSIS — Z23 Encounter for immunization: Secondary | ICD-10-CM

## 2016-04-22 DIAGNOSIS — R945 Abnormal results of liver function studies: Secondary | ICD-10-CM

## 2016-04-22 DIAGNOSIS — Z853 Personal history of malignant neoplasm of breast: Secondary | ICD-10-CM | POA: Diagnosis not present

## 2016-04-22 DIAGNOSIS — R7989 Other specified abnormal findings of blood chemistry: Secondary | ICD-10-CM

## 2016-04-22 DIAGNOSIS — R932 Abnormal findings on diagnostic imaging of liver and biliary tract: Secondary | ICD-10-CM

## 2016-04-22 NOTE — Patient Instructions (Signed)
Will arrange diagnostic mammogram  For reasons discussed to be available  When you see Dr Excell Seltzer .

## 2016-04-22 NOTE — Progress Notes (Signed)
Pre visit review using our clinic review tool, if applicable. No additional management support is needed unless otherwise documented below in the visit note.  Chief Complaint  Patient presents with  . Breast Problem    Rt Breast.    HPI: Jasmine Neal 70 y.o. comes in today for new acute appointment. She is noticed Possibly more pronounced lumpiness lump on the right side of her chest. Where she had treatment for breast cancer years ago. Has a history of breast cancer. Is planning to see the surgeon. However she is to get an MRI of her liver regarding uncertain imaging study and wonders if we should do an MRI of her breast at the same time. She denies pain but is not had a check for while of her right breast feels is more firm although could be related to weight change  No dj other pain.  ROS: See pertinent positives and negatives per HPI.  Past Medical History:  Diagnosis Date  . Abnormal GTT (glucose tolerance test)   . Asthma    evalutated by pulmonary severe by spirometry no smoker currently  . Breast cancer ([place])    hx of right lumpectomy, 1990 IIA radiation cmfp ajunct rx  . Chronic airway obstruction, not elsewhere classified   . Dysplasia    hx treated with cerv conization  . Fatty liver    US done 07-Nov-2008  . Fibromyalgia   . Hx of nonmelanoma skin cancer     followed dr [place]   . Hyperlipidemia   . Hypothyroidism   . Osteoarthritis     Family History  Problem Relation Age of Onset  . Osteoporosis    . Thyroid disease    . Breast cancer Mother 49    possible uterine cancer diagnosis at age 56  . Osteoporosis Mother     no hip fracture  . Breast cancer Sister 20  . Lung cancer Sister 34    former smoker  . Other Father     mysthenia gravis    Social History   Social History  . Marital status: Married    Spouse name: N/A  . Number of children: N/A  . Years of education: N/A   Occupational History  . Retired Pharmacist, hospital    Social History Main Topics  .  Smoking status: Former Smoker    Packs/day: 0.80    Years: 10.00    Types: Cigarettes    Quit date: 07/11/1974  . Smokeless tobacco: Never Used  . Alcohol use 3.5 oz/week    7 Standard drinks or equivalent per week     Comment: 1 glass of red wine nightly and socially  . Drug use: No  . Sexual activity: Yes    Partners: Male   Other Topics Concern  . None   Social History Narrative   Retired Pharmacist, hospital    hh of 1   Now has [place] smoking 30 + years ago. 20 year pack year smoking hx   Regular exercise- some  treadmill    8 hours sleep         Caffeine use: 1-2 cups of coffee/[employer] instant   Husband dx glioblastoma fall 15   Passed away fall November 08, 2014    Outpatient Medications Prior to Visit  Medication Sig Dispense Refill  . acetaminophen (TYLENOL) 500 MG tablet Take 1,000 mg by mouth every 6 (six) hours as needed. pain     . ANORO ELLIPTA 62.5-25 MCG/INH AEPB USE ONE INHALATION  EVERY  MORNING , RINSE MOUTH AFTER USE 180 each 2  . ASMANEX 60 METERED DOSES 220 MCG/INH inhaler USE 2 INHALATIONS ONCE  DAILY TO PREVENT COUGH OR  WHEEZING. RINSE, GARGLE AND SPIT AFTER USE 1 Inhaler 5  . aspirin 81 MG tablet Take 81 mg by mouth daily.    . cholecalciferol (VITAMIN D) 1000 UNITS tablet Take 1,000 Units by mouth daily.    . diphenhydramine-acetaminophen (TYLENOL PM) 25-500 MG TABS Take 1 tablet by mouth at bedtime as needed. sleep    . PARoxetine (PAXIL) 40 MG tablet Take 1 tablet (40 mg total) by mouth at bedtime. 90 tablet 3  . SYNTHROID 88 MCG tablet Take 1 tablet (88 mcg total) by mouth daily. 90 tablet 3  . valACYclovir (VALTREX) 1000 MG tablet TAKE 2 TABLETS BY MOUTH TWICE DAILY AS DIRECTED 30 tablet 0   No facility-administered medications prior to visit.      EXAM:  BP (!) 142/70 (BP Location: Right Arm, Patient Position: Sitting, Cuff Size: Normal)   Temp 98.1 F (36.7 C) (Oral)   Ht 5\' 9"  (1.753 m)   Wt 161 lb 3.2 oz (73.1 kg)   BMI 23.81 kg/m   Body mass index  is 23.81 kg/m.  GENERAL: vitals reviewed and listed above, alert, oriented, appears well hydrated and in no acute distress HEENT: atraumatic, conjunctiva  clear, no obvious abnormalities on inspection of external nose and ears  Left breast no obvious masses Right breast chronic changes firmness distortion no obvious axillary mass but very firm in the upper outer quadrant. Discoloration of skin. Abdomen no obvious masses. MS: moves all extremities without noticeable focal  abnormality PSYCH: pleasant and cooperative, no obvious depression or anxiety  ASSESSMENT AND PLAN:  Discussed the following assessment and plan:  Sign and symptom in breast - Plan: MM DIAG BREAST TOMO BILATERAL  HX: breast cancer - Plan: MM DIAG BREAST TOMO BILATERAL  Need for prophylactic vaccination and inoculation against influenza - Plan: Flu vaccine HIGH DOSE PF (Fluzone High dose) Patient is due for regular  mammogram in 2 months . Question concern of changing or needed for reevaluation of her right breast. Discussed options. Seeing surgeon or imaging first. Surgeon can do MRI if they see fit otherwise would do diagnostic mammogram start before she surgical consultation  -Patient advised to return or notify health care team  if symptoms worsen ,persist or new concerns arise.  Patient Instructions  Will arrange diagnostic mammogram  For reasons discussed to be available  When you see Dr Excell Seltzer .       Standley Brooking. Esha Fincher M.D.

## 2016-05-10 ENCOUNTER — Ambulatory Visit
Admission: RE | Admit: 2016-05-10 | Discharge: 2016-05-10 | Disposition: A | Discharge status: Home / Self Care | Payer: Medicare Other | Source: Ambulatory Visit | Attending: Internal Medicine | Admitting: Internal Medicine

## 2016-05-10 DIAGNOSIS — R7989 Other specified abnormal findings of blood chemistry: Secondary | ICD-10-CM

## 2016-05-10 DIAGNOSIS — R945 Abnormal results of liver function studies: Secondary | ICD-10-CM

## 2016-05-10 DIAGNOSIS — R932 Abnormal findings on diagnostic imaging of liver and biliary tract: Secondary | ICD-10-CM

## 2016-05-10 MED ORDER — GADOBENATE DIMEGLUMINE 529 MG/ML IV SOLN
15.0000 mL | Freq: Once | INTRAVENOUS | Status: AC | PRN
  Administered 2016-05-10: 15 mL via INTRAVENOUS

## 2016-05-11 ENCOUNTER — Other Ambulatory Visit: Payer: Self-pay | Admitting: Internal Medicine

## 2016-05-11 DIAGNOSIS — R945 Abnormal results of liver function studies: Secondary | ICD-10-CM

## 2016-05-11 DIAGNOSIS — R7989 Other specified abnormal findings of blood chemistry: Secondary | ICD-10-CM

## 2016-05-16 ENCOUNTER — Other Ambulatory Visit (INDEPENDENT_AMBULATORY_CARE_PROVIDER_SITE_OTHER): Payer: Medicare Other

## 2016-05-16 DIAGNOSIS — R945 Abnormal results of liver function studies: Secondary | ICD-10-CM

## 2016-05-16 DIAGNOSIS — R7989 Other specified abnormal findings of blood chemistry: Secondary | ICD-10-CM

## 2016-05-16 LAB — HEPATIC FUNCTION PANEL
ALBUMIN: 4.2 g/dL (ref 3.5–5.2)
ALK PHOS: 84 U/L (ref 39–117)
ALT: 26 U/L (ref 0–35)
AST: 20 U/L (ref 0–37)
BILIRUBIN DIRECT: 0.1 mg/dL (ref 0.0–0.3)
Total Bilirubin: 0.6 mg/dL (ref 0.2–1.2)
Total Protein: 6.4 g/dL (ref 6.0–8.3)

## 2016-05-16 LAB — FERRITIN: FERRITIN: 77.3 ng/mL (ref 10.0–291.0)

## 2016-05-16 LAB — IBC PANEL
Iron: 77 ug/dL (ref 42–145)
SATURATION RATIOS: 23.1 % (ref 20.0–50.0)
Transferrin: 238 mg/dL (ref 212.0–360.0)

## 2016-05-17 LAB — ANA: Anti Nuclear Antibody(ANA): NEGATIVE

## 2016-05-20 ENCOUNTER — Telehealth: Payer: Self-pay | Admitting: Internal Medicine

## 2016-05-20 NOTE — Telephone Encounter (Signed)
°  Pt call to ask if she is suppose to see a GI doctor. Would like a call back

## 2016-05-23 LAB — HEMOCHROMATOSIS DNA-PCR(C282Y,H63D)

## 2016-05-23 NOTE — Telephone Encounter (Signed)
Pt had ultrasound of the liver on 05/10/16.  Would like to know if she still needs to go to GI.  Please advise.

## 2016-05-24 ENCOUNTER — Other Ambulatory Visit: Payer: Self-pay | Admitting: Family Medicine

## 2016-05-24 DIAGNOSIS — Z853 Personal history of malignant neoplasm of breast: Secondary | ICD-10-CM

## 2016-05-24 DIAGNOSIS — N63 Unspecified lump in unspecified breast: Secondary | ICD-10-CM

## 2016-05-24 NOTE — Telephone Encounter (Signed)
Spoke to the pt and advised that [facility] is still waiting on lab result.  Will make decision at that time.  Lab results normal at this time.  Will repeat labs in 3-4 months if all tests are ok.  Pt agrees.

## 2016-05-24 NOTE — Telephone Encounter (Signed)
Left a message for a return call.

## 2016-05-24 NOTE — Telephone Encounter (Signed)
Pt calling again to check the status to see if she should be going to a GI doctor.

## 2016-05-24 NOTE — Telephone Encounter (Signed)
was waiting for one last tests result   Not in ehr    Last tests are normal so far      And could wait  To go to gi    If the last  Lab tests are  normal    We can repeat  lfts = every 3-4 months until certain remains normal

## 2016-06-27 ENCOUNTER — Encounter: Payer: Self-pay | Admitting: Internal Medicine

## 2016-08-17 ENCOUNTER — Other Ambulatory Visit: Payer: Self-pay | Admitting: General Surgery

## 2016-08-17 DIAGNOSIS — N6310 Unspecified lump in the right breast, unspecified quadrant: Secondary | ICD-10-CM

## 2016-08-23 ENCOUNTER — Other Ambulatory Visit: Payer: Self-pay | Admitting: General Surgery

## 2016-08-23 ENCOUNTER — Ambulatory Visit
Admission: RE | Admit: 2016-08-23 | Discharge: 2016-08-23 | Disposition: A | Discharge status: Home / Self Care | Payer: Medicare Other | Source: Ambulatory Visit | Attending: General Surgery | Admitting: General Surgery

## 2016-08-23 DIAGNOSIS — N6310 Unspecified lump in the right breast, unspecified quadrant: Secondary | ICD-10-CM

## 2016-08-24 ENCOUNTER — Ambulatory Visit
Admission: RE | Admit: 2016-08-24 | Discharge: 2016-08-24 | Disposition: A | Discharge status: Home / Self Care | Payer: Medicare Other | Source: Ambulatory Visit | Attending: General Surgery | Admitting: General Surgery

## 2016-08-24 ENCOUNTER — Other Ambulatory Visit: Payer: Self-pay | Admitting: General Surgery

## 2016-08-24 DIAGNOSIS — N6310 Unspecified lump in the right breast, unspecified quadrant: Secondary | ICD-10-CM

## 2016-10-04 ENCOUNTER — Telehealth: Payer: Self-pay | Admitting: Allergy and Immunology

## 2016-10-04 NOTE — Telephone Encounter (Signed)
Patient called asking about a medication that can be used for both asthma and COPD. She said she called yesterday asking about this. She said it was something Dr. Neldon Mc had suggested to her at one time. The main reason for her call is that she needs her Asmanex refilled for preferably 2 months. She is going out of town and needs it filled soon. [place].

## 2016-10-04 NOTE — Telephone Encounter (Signed)
She may be referring to Trelegy which has both an inhaled steroid, long-acting bronchodilator, and anticholinergic agent a lot like combining Asmanex and Anoro. Which she like to try that agent? We would need to see if her insurance will cover that agent.

## 2016-10-04 NOTE — Telephone Encounter (Signed)
Do you know what medication she is speaking of?

## 2016-10-05 MED ORDER — MOMETASONE FUROATE 220 MCG/INH IN AEPB: INHALATION_SPRAY | RESPIRATORY_TRACT | 3 refills | Status: DC

## 2016-10-05 NOTE — Telephone Encounter (Signed)
Patient is interested but would like to discuss at further length at her next office visit because she is going out of town. I will go ahead and send in the 2 month supply of the Asmanex as well.

## 2016-10-06 ENCOUNTER — Other Ambulatory Visit: Payer: Self-pay | Admitting: Allergy and Immunology

## 2016-12-06 ENCOUNTER — Ambulatory Visit (INDEPENDENT_AMBULATORY_CARE_PROVIDER_SITE_OTHER): Payer: Medicare Other | Admitting: Allergy and Immunology

## 2016-12-06 ENCOUNTER — Encounter: Payer: Self-pay | Admitting: Allergy and Immunology

## 2016-12-06 VITALS — BP 124/72 | HR 68 | Resp 16

## 2016-12-06 DIAGNOSIS — J449 Chronic obstructive pulmonary disease, unspecified: Secondary | ICD-10-CM

## 2016-12-06 NOTE — Progress Notes (Signed)
Follow-up Note  Referring Provider: Burnis Medin, MD Primary Provider: Burnis Medin, MD Date of Office Visit: 12/06/2016  Subjective:   Jasmine Neal (DOB: 17-Feb-1946) is a 71 y.o. female who returns to the [facility] [place] on 12/06/2016 in re-evaluation of the following:  HPI: Katricia returns to this clinic in reevaluation of her COPD with component of asthma. I have not seen her in this clinic in approximately one year. She has really had a good year without a requirement for systemic steroid or an antibiotic to treat any type of respiratory tract issue. She rarely uses a short acting bronchodilator. She continues on Anoro and Asmanex. The only issue that she does have is her ability to exercise. She rides the bike a few times a week and works out at [employer] twice a week and walks her dog just about every day and has lost about 20 pounds of weight in the past 2 years and overall she feels good. However, there is a limitation in her ability to reach a high exercise threshold. She will get short of breath if she is walking up a hill. She still continues to walk through this SOB but she does get labored breathing. She does not think that the administration of her inhalers have really change this issue at all.  Allergies as of 12/06/2016      Reactions   Hydrocodone Itching   Face swelled up      Medication List      acetaminophen 500 MG tablet Commonly known as:  TYLENOL Take 1,000 mg by mouth every 6 (six) hours as needed. pain   ANORO ELLIPTA 62.5-25 MCG/INH Aepb Generic drug:  umeclidinium-vilanterol USE ONE INHALATION EVERY  MORNING , RINSE MOUTH AFTER USE   aspirin 81 MG tablet Take 81 mg by mouth daily.   cholecalciferol 1000 units tablet Commonly known as:  VITAMIN D Take 1,000 Units by mouth daily.   diphenhydramine-acetaminophen 25-500 MG Tabs tablet Commonly known as:  TYLENOL PM Take 1 tablet by mouth at bedtime as needed. sleep   mometasone 220  MCG/INH inhaler Commonly known as:  ASMANEX 60 METERED DOSES USE 2 INHALATIONS ONCE  DAILY TO PREVENT COUGH OR  WHEEZING. RINSE, GARGLE AND SPIT AFTER USE   PARoxetine 40 MG tablet Commonly known as:  PAXIL Take 1 tablet (40 mg total) by mouth at bedtime.   SYNTHROID 88 MCG tablet Generic drug:  levothyroxine Take 1 tablet (88 mcg total) by mouth daily.   TH VITAMIN B12 100 MCG tablet Generic drug:  cyanocobalamin Take by mouth.   valACYclovir 1000 MG tablet Commonly known as:  VALTREX TAKE 2 TABLETS BY MOUTH TWICE DAILY AS DIRECTED       Past Medical History:  Diagnosis Date  . Abnormal GTT (glucose tolerance test)   . Asthma    evalutated by pulmonary severe by spirometry no smoker currently  . Breast cancer ([place])    hx of right lumpectomy, 1990 IIA radiation cmfp ajunct rx  . Chronic airway obstruction, not elsewhere classified   . Dysplasia    hx treated with cerv conization  . Fatty liver    US done 2010  . Fibromyalgia   . Hx of nonmelanoma skin cancer     followed dr [place]   . Hyperlipidemia   . Hypothyroidism   . Osteoarthritis     Past Surgical History:  Procedure Laterality Date  . BREAST LUMPECTOMY     on  right, 1990 IIA radiation cmfp ajuction rx  . KNEE SURGERY    . NASAL SINUS SURGERY     on right done by Dr. Wilburn Cornelia    Review of systems negative except as noted in HPI / PMHx or noted below:  Review of Systems  Constitutional: Negative.   HENT: Negative.   Eyes: Negative.   Respiratory: Negative.   Cardiovascular: Negative.   Gastrointestinal: Negative.   Genitourinary: Negative.   Musculoskeletal: Negative.   Skin: Negative.   Neurological: Negative.   Endo/Heme/Allergies: Negative.   Psychiatric/Behavioral: Negative.      Objective:   Vitals:   12/06/16 1540  BP: 124/72  Pulse: 68  Resp: 16          Physical Exam  Constitutional: She is well-developed, well-nourished, and in no distress.  HENT:  Head:  Normocephalic.  Right Ear: Tympanic membrane, external ear and ear canal normal.  Left Ear: Tympanic membrane, external ear and ear canal normal.  Nose: Nose normal. No mucosal edema or rhinorrhea.  Mouth/Throat: Uvula is midline, oropharynx is clear and moist and mucous membranes are normal. No oropharyngeal exudate.  Eyes: Conjunctivae are normal.  Neck: Trachea normal. No tracheal tenderness present. No tracheal deviation present. No thyromegaly present.  Cardiovascular: Normal rate, regular rhythm, S1 normal, S2 normal and normal heart sounds.   No murmur heard. Pulmonary/Chest: Breath sounds normal. No stridor. No respiratory distress. She has no wheezes. She has no rales.  Musculoskeletal: She exhibits no edema.  Lymphadenopathy:       Head (right side): No tonsillar adenopathy present.       Head (left side): No tonsillar adenopathy present.    She has no cervical adenopathy.  Neurological: She is alert. Gait normal.  Skin: No rash noted. She is not diaphoretic. No erythema. Nails show no clubbing.  Psychiatric: Mood and affect normal.    Diagnostics:    Spirometry was performed and demonstrated an FEV1 of 0.96 at 38 % of predicted.  The patient had an Asthma Control Test with the following results: ACT Total Score: 22.    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 Continue Anoro one inhalation 1 time per day  2. Discontinue Asmanex  3. Continue Ventolin HFA 2 puffs every 4-6 hours if needed  4. Obtain fall flu vaccine  5. Return to clinic in 12 months or earlier if problem  To determine whether or not Dianne's respiratory tract issue is a reflection of changed architecture secondary to her smoking history and radiation exposure or she has a true inflammatory component with reversibility I am going to eliminate her Asmanex today. She has a very good understanding of her condition and she will contact me should she note that over the course the next several  weeks to months she has a decrease in her lung function and ability to exert herself. If so we can always restart Asmanex. If she does well on just 2 medications rather than 3 I think that she should probably choose that plan. I'll see her in this clinic in 12 months or earlier if there is a problem.  Allena Katz, MD [facility] [place]

## 2016-12-06 NOTE — Patient Instructions (Addendum)
  1. Continue Anoro one inhalation 1 time per day  2. Discontinue Asmanex  3. Continue Ventolin HFA 2 puffs every 4-6 hours if needed  4. Obtain fall flu vaccine  5. Return to clinic in 12 months or earlier if problem

## 2016-12-12 ENCOUNTER — Other Ambulatory Visit: Payer: Self-pay | Admitting: Allergy and Immunology

## 2016-12-12 MED ORDER — ANORO ELLIPTA 62.5-25 MCG/INH IN AEPB: INHALATION_SPRAY | RESPIRATORY_TRACT | 3 refills | Status: DC

## 2016-12-12 NOTE — Telephone Encounter (Signed)
Left message advising medication was sent in

## 2016-12-12 NOTE — Telephone Encounter (Signed)
patient needs a refill on ANORO ELLIPTA called into [facility] for 3 months supply

## 2016-12-20 ENCOUNTER — Other Ambulatory Visit: Payer: Self-pay | Admitting: Internal Medicine

## 2017-02-07 ENCOUNTER — Other Ambulatory Visit: Payer: Self-pay | Admitting: Internal Medicine

## 2017-02-17 NOTE — Progress Notes (Signed)
Chief Complaint  Patient presents with  . Annual Exam    HPI: Jasmine Neal 71 y.o. comes in today for Preventive Medicare exam/ wellness visit . and Chronic disease management Since last visit.  COPD Jasmine Neal  On copd inhaler .  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    Health Maintenance  Topic Date Due  . TETANUS/TDAP  08/11/2016  . INFLUENZA VACCINE  02/08/2017  . MAMMOGRAM  08/23/2018  . COLONOSCOPY  05/14/2023  . DEXA SCAN  Completed  . Hepatitis C Screening  Completed  . PNA vac Low Risk Adult  Completed   Health Maintenance Review LIFESTYLE:  Exercise:  Lost 20 pounds walking dog pit bull.  Gym  Rower and bike pain in back   And stopped  And had bruising .  Tobacco/ETS: no  Alcohol:   Red wine at dinner  Sugar beverages: no ocass.   Sleep: plenty  8 hours  Drug use: no HH: 1  Pet dog     Hearing:  ? If losing left ear.  Not bad  Vision:  No limitations at present . Last eye check UTD cataract surgerySafety:  Has smoke detector and wears seat belts.  No firearms. No excess sun exposure. Sees dentist regularly.  Falls: n  Memory: Felt to be good  , no concern from Jasmine Neal or Jasmine Neal family.  Depression: No anhedonia unusual crying or depressive symptoms sadness   Nutrition: Eats well balanced diet; adequate calcium and vitamin D. No swallowing chewing problems.  Injury: no major injuries in the last six months.  Other healthcare providers:  Reviewed today .  Social: widowed last year . No pets.   Preventive parameters: up-to-date  Reviewed   ADLS:   There are no problems or need for assistance  driving, feeding, obtaining food, dressing, toileting and bathing,  managing money using phone. Jasmine Neal is independent.     ROS:  ocass sore left calk vv no redness welling or ulcer  GEN/ HEENT: No fever, significant weight changes sweats headaches vision problems hearing changes, CV/ PULM; No chest pain shortness of breath cough, syncope,edema  change in exercise tolerance. GI /GU: No adominal pain, vomiting, change in bowel habits. No blood in the stool. No significant GU symptoms. SKIN/HEME: ,no acute skin rashes suspicious lesions or bleeding. No lymphadenopathy, nodules, masses.  NEURO/ PSYCH:  No neurologic signs such as weakness numbness. No depression anxiety. IMM/ Allergy: No unusual infections.  Allergy .   REST of 12 system review negative except as per HPI   Past Medical History:  Diagnosis Date  . Abnormal GTT (glucose tolerance test)   . Asthma    evalutated by pulmonary severe by spirometry no smoker currently  . Breast cancer ([place])    hx of right lumpectomy, 1990 IIA radiation cmfp ajunct rx  . Chronic airway obstruction, not elsewhere classified   . Dysplasia    hx treated with cerv conization  . Fatty liver    US done 2010  . Fibromyalgia   . Hx of nonmelanoma skin cancer     followed  dr [place]   . Hyperlipidemia   . Hypothyroidism   . Osteoarthritis     Family History  Problem Relation Age of Onset  . Other Father        mysthenia gravis  . Lung cancer Sister   . Osteoporosis Unknown   . Thyroid disease Unknown   . Breast cancer Mother 23       possible uterine cancer diagnosis at age 36  . Osteoporosis Mother        no hip fracture  . Breast cancer Sister 7  . Lung cancer Sister 38       former smoker  . Lung cancer Paternal Grandmother     Social History   Social History  . Marital status: Married    Spouse name: N/A  . Number of children: N/A  . Years of education: N/A   Occupational History  . Retired Pharmacist, hospital    Social History Main Topics  . Smoking status: Former Smoker    Packs/day: 0.80     Years: 10.00    Types: Cigarettes    Quit date: 07/11/1974  . Smokeless tobacco: Never Used  . Alcohol use 3.5 oz/week    7 Standard drinks or equivalent per week     Comment: 1 glass of red wine nightly and socially  . Drug use: No  . Sexual activity: Yes    Partners: Male   Other Topics Concern  . None   Social History Narrative   Retired Pharmacist, hospital    hh of 1   Now has [place] smoking 30 + years ago. 20 year pack year smoking hx   Regular exercise- some  treadmill    8 hours sleep         Caffeine use: 1-2 cups of coffee/[employer] instant   Husband dx glioblastoma fall 15   Passed away fall 10-14-2014    Outpatient Encounter Prescriptions as of 02/27/2017  Medication Sig  . acetaminophen (TYLENOL) 500 MG tablet Take 1,000 mg by mouth every 6 (six) hours as needed. pain   . ANORO ELLIPTA 62.5-25 MCG/INH AEPB USE ONE INHALATION EVERY  MORNING , RINSE MOUTH AFTER USE  . aspirin 81 MG tablet Take 81 mg by mouth daily.  . cholecalciferol (VITAMIN D) 1000 UNITS tablet Take 1,000 Units by mouth daily.  . cyanocobalamin (TH VITAMIN B12) 100 MCG tablet Take by mouth.  . diphenhydramine-acetaminophen (TYLENOL PM) 25-500 MG TABS Take 1 tablet by mouth at bedtime as needed. sleep  . PARoxetine (PAXIL) 40 MG tablet TAKE 1 TABLET BY MOUTH AT  BEDTIME  . SYNTHROID 88 MCG tablet TAKE 1 TABLET BY MOUTH  DAILY  . valACYclovir (VALTREX) 1000 MG tablet TAKE 2 TABLETS BY MOUTH TWICE DAILY AS DIRECTED  . [DISCONTINUED] mometasone (ASMANEX 60 METERED DOSES) 220 MCG/INH inhaler USE 2 INHALATIONS ONCE  DAILY TO PREVENT COUGH OR  WHEEZING. RINSE, GARGLE AND SPIT AFTER USE   No facility-administered encounter medications on file as of 02/27/2017.     EXAM:  BP 110/88 (BP Location: Right Arm, Patient Position: Sitting, Cuff Size: Normal)   Pulse 74   Temp 98.1 F (36.7 C) (Oral)   Ht 5' 8.5" (1.74 m)   Wt 159 lb 3.2 oz (72.2 kg)   BMI 23.85 kg/m   Body mass index is 23.85 kg/m.  Physical  Exam: Vital signs reviewed PRF:FMBW is a well-developed well-nourished alert cooperative   who appears stated age in no acute distress.  HEENT:  normocephalic atraumatic , Eyes: PERRL EOM's full, conjunctiva clear, Nares: paten,t no deformity discharge or tenderness., Ears: no deformity EAC's clear TMs with normal landmarks. Mouth: clear OP, no lesions, edema.  Moist mucous membranes. Dentition in adequate repair. NECK: supple without masses, thyromegaly or bruits. CHEST/PULM:  Clear to auscultation and percussion breath sounds equal no wheeze , rales or rhonchi. No chest wall deformities or tenderness. Right breast   Cancer changes left nl  CV: PMI is nondisplaced, S1 S2 no gallops, murmurs, rubs. Peripheral pulses are full without delay.No JVD .   Vv left calf no ulce rrends tender  ABDOMEN: Bowel sounds normal nontender  No guard or rebound, no hepato splenomegal no CVA tenderness.   Extremtities:  No clubbing cyanosis or edema, no acute joint swelling or redness no focal atrophy NEURO:  Oriented x3, cranial nerves 3-12 appear to be intact, no obvious focal weakness,gait within normal limits no abnormal reflexes or asymmetrical SKIN: No acute rashes normal turgor, color, no bruising or petechiae. PSYCH: Oriented, good eye contact, no obvious depression anxiety, cognition and judgment appear normal. LN: no cervical axillary inguinal adenopathy No obvious noted deficits in memory, attention, and speech.   Wt Readings from Last 3 Encounters:  02/27/17 159 lb 3.2 oz (72.2 kg)  04/22/16 161 lb 3.2 oz (73.1 kg)  02/16/16 166 lb 12.8 oz (75.7 kg)   BP Readings from Last 3 Encounters:  02/27/17 110/88  12/06/16 124/72  04/22/16 (!) 142/70      ASSESSMENT AND PLAN:  Discussed the following assessment and plan:  Visit for preventive health examination  Hypothyroidism, unspecified type - Plan: CBC with Differential/Platelet, Hepatic function panel, Lipid panel, TSH, Basic metabolic panel,  VITAMIN D 25 Hydroxy (Vit-D Deficiency, Fractures), DG Bone Density  Medication management - Plan: CBC with Differential/Platelet, Hepatic function panel, Lipid panel, TSH, Basic metabolic panel, VITAMIN D 25 Hydroxy (Vit-D Deficiency, Fractures), DG Bone Density  BREAST CANCER, HX OF - Plan: CBC with Differential/Platelet, Hepatic function panel, Lipid panel, TSH, Basic metabolic panel, VITAMIN D 25 Hydroxy (Vit-D Deficiency, Fractures), DG Bone Density  COPD, moderate (HCC) - Plan: CBC with Differential/Platelet, Hepatic function panel, Lipid panel, TSH, Basic metabolic panel, VITAMIN D 25 Hydroxy (Vit-D Deficiency, Fractures)  Elevated alkaline phosphatase level - Plan: CBC with Differential/Platelet, Hepatic function panel, Lipid panel, TSH, Basic metabolic panel, VITAMIN D 25 Hydroxy (Vit-D Deficiency, Fractures)  Abnormal LFTs - Plan: CBC with Differential/Platelet, Hepatic function panel, Lipid panel, TSH, Basic metabolic panel, VITAMIN D 25 Hydroxy (Vit-D Deficiency, Fractures)  Low serum vitamin D ? with elevated alk phos hx - Plan: CBC with Differential/Platelet, Hepatic function panel, Lipid panel, TSH, Basic metabolic panel, VITAMIN D 25 Hydroxy (Vit-D Deficiency, Fractures)  Estrogen deficiency - Plan: DG Bone Density  Need for prophylactic vaccination with tetanus-diphtheria (Td) - Plan: Td vaccine greater than or equal to 7yo preservative free IM Hs hx of abnormal lfts  See mri liver last year  Ok   Has healthy weight loss  Continue  Follow up labs today  Counseled regarding healthy nutrition, exercise, sleep, injury prevention, calcium vit d and healthy weight . Get dexa scan  Risk  Suspect back pain was from gien exercise no alarm sx at this time but fu if returing Patient Care Team: Brittainy Bucker, Standley Brooking, MD as PCP - Adelene Idler, MD [place], Amy, MD as Consulting Physician (Dermatology) Neldon Mc, Donnamarie Poag, MD as Consulting Physician (Allergy and Immunology) Marygrace Drought, MD as Consulting Physician (Ophthalmology) Clent Jacks,  MD as Consulting Physician (Ophthalmology) Neldon Mc, Donnamarie Poag, MD as Consulting Physician (Allergy and Immunology)  Patient Instructions    Will notify you  of labs when available. If all ok then yearly chick up . monitoring  Continue lifestyle intervention healthy eating and weight bearing  exercise .   Mediterranean eating activity   Get updated bone density  Vit d 1000 iu per day or as directed  Your last dexa was 2014 and was in normal range .   Can try alternating Paxil 40 with 20 mg per day ( 3 days per week ) for 3-4 weeks and then down to 20 mg per day  Call for advice   Check into shingles vaccine. Shingrix  Can receive either in the office or pharmacy depending on insurance rules.  Get bone density appt.         Why follow it? Research shows. . Those who follow the Mediterranean diet have a reduced risk of heart disease  . The diet is associated with a reduced incidence of Parkinson's and Alzheimer's diseases . People following the diet may have longer life expectancies and lower rates of chronic diseases  . The Dietary Guidelines for Americans recommends the Mediterranean diet as an eating plan to promote health and prevent disease  What Is the Mediterranean Diet?  . Healthy eating plan based on typical foods and recipes of Mediterranean-style cooking . The diet is primarily a plant based diet; these foods should make up a majority of meals   Starches - Plant based foods should make up a majority of meals - They are an important sources of vitamins, minerals, energy, antioxidants, and fiber - Choose whole grains, foods high in fiber and minimally processed items  - Typical grain sources include wheat, oats, barley, corn, brown rice, bulgar, farro, millet, polenta, couscous  - Various types of beans include chickpeas, lentils, fava beans, black beans, white beans   Fruits  Veggies - Large quantities of  antioxidant rich fruits & veggies; 6 or more servings  - Vegetables can be eaten raw or lightly drizzled with oil and cooked  - Vegetables common to the traditional Mediterranean Diet include: artichokes, arugula, beets, broccoli, brussel sprouts, cabbage, carrots, celery, collard greens, cucumbers, eggplant, kale, leeks, lemons, lettuce, mushrooms, okra, onions, peas, peppers, potatoes, pumpkin, radishes, rutabaga, shallots, spinach, sweet potatoes, turnips, zucchini - Fruits common to the Mediterranean Diet include: apples, apricots, avocados, cherries, clementines, dates, figs, grapefruits, grapes, melons, nectarines, oranges, peaches, pears, pomegranates, strawberries, tangerines  Fats - Replace butter and margarine with healthy oils, such as olive oil, canola oil, and tahini  - Limit nuts to no more than a handful a day  - Nuts include walnuts, almonds, pecans, pistachios, pine nuts  - Limit or avoid candied, honey roasted or heavily salted nuts - Olives are central to the [employer] - can be eaten whole or used in a variety of dishes   Meats Protein - Limiting red meat: no more than a few times a month - When eating red meat: choose lean cuts and keep the portion to the size of deck of cards - Eggs: approx. 0 to 4 times a week  - Fish and lean poultry: at least 2 a week  - Healthy protein sources include, chicken, [place], lean beef, lamb - Increase intake of seafood such as tuna, salmon, trout, mackerel, shrimp, scallops - Avoid or limit high fat processed meats such as sausage and bacon  Dairy - Include moderate amounts of  low fat dairy products  - Focus on healthy dairy such as fat free yogurt, skim milk, low or reduced fat cheese - Limit dairy products higher in fat such as whole or 2% milk, cheese, ice cream  Alcohol - Moderate amounts of red wine is ok  - No more than 5 oz daily for women (all ages) and men older than age 14  - No more than 10 oz of wine daily for men younger  than 102  Other - Limit sweets and other desserts  - Use herbs and spices instead of salt to flavor foods  - Herbs and spices common to the traditional Mediterranean Diet include: basil, bay leaves, chives, cloves, cumin, fennel, garlic, lavender, marjoram, mint, oregano, parsley, pepper, rosemary, sage, savory, sumac, tarragon, thyme   It's not just a diet, it's a lifestyle:  . The Mediterranean diet includes lifestyle factors typical of those in the region  . Foods, drinks and meals are best eaten with others and savored . Daily physical activity is important for overall good health . This could be strenuous exercise like running and aerobics . This could also be more leisurely activities such as walking, housework, yard-work, or taking the stairs . Moderation is the key; a balanced and healthy diet accommodates most foods and drinks . Consider portion sizes and frequency of consumption of certain foods   Meal Ideas & Options:  . Breakfast:  o Whole wheat toast or whole wheat English muffins with peanut butter & hard boiled egg o Steel cut oats topped with apples & cinnamon and skim milk  o Fresh fruit: banana, strawberries, melon, berries, peaches  o Smoothies: strawberries, bananas, greek yogurt, peanut butter o Low fat greek yogurt with blueberries and granola  o Egg white omelet with spinach and mushrooms o Breakfast couscous: whole wheat couscous, apricots, skim milk, cranberries  . Sandwiches:  o Hummus and grilled vegetables (peppers, zucchini, squash) on whole wheat bread   o Grilled chicken on whole wheat pita with lettuce, tomatoes, cucumbers or tzatziki  o Tuna salad on whole wheat bread: tuna salad made with greek yogurt, olives, red peppers, capers, green onions o Garlic rosemary lamb pita: lamb sauted with garlic, rosemary, salt & pepper; add lettuce, cucumber, greek yogurt to pita - flavor with lemon juice and black pepper  . Seafood:  o Mediterranean grilled salmon,  seasoned with garlic, basil, parsley, lemon juice and black pepper o Shrimp, lemon, and spinach whole-grain pasta salad made with low fat greek yogurt  o Seared scallops with lemon orzo  o Seared tuna steaks seasoned salt, pepper, coriander topped with tomato mixture of olives, tomatoes, olive oil, minced garlic, parsley, green onions and cappers  . Meats:  o Herbed greek chicken salad with kalamata olives, cucumber, feta  o Red bell peppers stuffed with spinach, bulgur, lean ground beef (or lentils) & topped with feta   o Kebabs: skewers of chicken, tomatoes, onions, zucchini, squash  o [place] burgers: made with red onions, mint, dill, lemon juice, feta cheese topped with roasted red peppers . Vegetarian o Cucumber salad: cucumbers, artichoke hearts, celery, red onion, feta cheese, tossed in olive oil & lemon juice  o Hummus and whole grain pita points with a greek salad (lettuce, tomato, feta, olives, cucumbers, red onion) o Lentil soup with celery, carrots made with vegetable broth, garlic, salt and pepper  o Tabouli salad: parsley, bulgur, mint, scallions, cucumbers, tomato, radishes, lemon juice, olive oil, salt and pepper.  Bone Health Bones protect organs, store calcium, and anchor muscles. Good health habits, such as eating nutritious foods and exercising regularly, are important for maintaining healthy bones. They can also help to prevent a condition that causes bones to lose density and become weak and brittle (osteoporosis). Why is bone mass important? Bone mass refers to the amount of bone tissue that you have. The higher your bone mass, the stronger your bones. An important step toward having healthy bones throughout life is to have strong and dense bones during childhood. A young adult who has a high bone mass is more likely to have a high bone mass later in life. Bone mass at its greatest it is called peak bone mass. A large decline in bone mass occurs in older adults. In  women, it occurs about the time of menopause. During this time, it is important to practice good health habits, because if more bone is lost than what is replaced, the bones will become less healthy and more likely to break (fracture). If you find that you have a low bone mass, you may be able to prevent osteoporosis or further bone loss by changing your diet and lifestyle. How can I find out if my bone mass is low? Bone mass can be measured with an X-ray test that is called a bone mineral density (BMD) test. This test is recommended for all women who are age 90 or older. It may also be recommended for men who are age 321 or older, or for people who are more likely to develop osteoporosis due to:  Having bones that break easily.  Having a long-term disease that weakens bones, such as kidney disease or rheumatoid arthritis.  Having menopause earlier than normal.  Taking medicine that weakens bones, such as steroids, thyroid hormones, or hormone treatment for breast cancer or prostate cancer.  Smoking.  Drinking three or more alcoholic drinks each day.  What are the nutritional recommendations for healthy bones? To have healthy bones, you need to get enough of the right minerals and vitamins. Most nutrition experts recommend getting these nutrients from the foods that you eat. Nutritional recommendations vary from person to person. Ask your health care provider what is healthy for you. Here are some general guidelines. Calcium Recommendations Calcium is the most important (essential) mineral for bone health. Most people can get enough calcium from their diet, but supplements may be recommended for people who are at risk for osteoporosis. Good sources of calcium include:  Dairy products, such as low-fat or nonfat milk, cheese, and yogurt.  Dark green leafy vegetables, such as bok choy and broccoli.  Calcium-fortified foods, such as orange juice, cereal, bread, soy beverages, and tofu  products.  Nuts, such as almonds.  Follow these recommended amounts for daily calcium intake:  Children, age 327?3: 700 mg.  Children, age 32?8: 1,000 mg.  Children, age 327?13: 1,300 mg.  Teens, age 32?18: 1,300 mg.  Adults, age 77?50: 1,000 mg.  Adults, age 83?70: ? Men: 1,000 mg. ? Women: 1,200 mg.  Adults, age 75 or older: 1,200 mg.  Pregnant and breastfeeding females: ? Teens: 1,300 mg. ? Adults: 1,000 mg.  Vitamin D Recommendations Vitamin D is the most essential vitamin for bone health. It helps the body to absorb calcium. Sunlight stimulates the skin to make vitamin D, so be sure to get enough sunlight. If you live in a cold climate or you do not get outside often, your health care provider may recommend that you take  vitamin D supplements. Good sources of vitamin D in your diet include:  Egg yolks.  Saltwater fish.  Milk and cereal fortified with vitamin D.  Follow these recommended amounts for daily vitamin D intake:  Children and teens, age 61?18: 14 international units.  Adults, age 6 or younger: 400-800 international units.  Adults, age 26 or older: 800-1,000 international units.  Other Nutrients Other nutrients for bone health include:  Phosphorus. This mineral is found in meat, poultry, dairy foods, nuts, and legumes. The recommended daily intake for adult men and adult women is 700 mg.  Magnesium. This mineral is found in seeds, nuts, dark green vegetables, and legumes. The recommended daily intake for adult men is 400?420 mg. For adult women, it is 310?320 mg.  Vitamin K. This vitamin is found in green leafy vegetables. The recommended daily intake is 120 mg for adult men and 90 mg for adult women.  What type of physical activity is best for building and maintaining healthy bones? Weight-bearing and strength-building activities are important for building and maintaining peak bone mass. Weight-bearing activities cause muscles and bones to work against  gravity. Strength-building activities increases muscle strength that supports bones. Weight-bearing and muscle-building activities include:  Walking and hiking.  Jogging and running.  Dancing.  Gym exercises.  Lifting weights.  Tennis and racquetball.  Climbing stairs.  Aerobics.  Adults should get at least 30 minutes of moderate physical activity on most days. Children should get at least 60 minutes of moderate physical activity on most days. Ask your health care provide what type of exercise is best for you. Where can I find more information? For more information, check out the following websites:  [place]: [employer].se  [employer] of Health: http://www.niams.AnonymousEar.fr.asp  This information is not intended to replace advice given to you by your health care provider. Make sure you discuss any questions you have with your health care provider. Document Released: 09/17/2003 Document Revised: 01/15/2016 Document Reviewed: 07/02/2014 Elsevier Interactive Patient Education  2018 [place]. Anand Tejada M.D.

## 2017-02-20 ENCOUNTER — Other Ambulatory Visit: Payer: Medicare Other

## 2017-02-27 ENCOUNTER — Ambulatory Visit (INDEPENDENT_AMBULATORY_CARE_PROVIDER_SITE_OTHER): Payer: Medicare Other | Admitting: Internal Medicine

## 2017-02-27 ENCOUNTER — Encounter: Payer: Self-pay | Admitting: Internal Medicine

## 2017-02-27 VITALS — BP 110/88 | HR 74 | Temp 98.1°F | Ht 68.5 in | Wt 159.2 lb

## 2017-02-27 DIAGNOSIS — E039 Hypothyroidism, unspecified: Secondary | ICD-10-CM | POA: Diagnosis not present

## 2017-02-27 DIAGNOSIS — R7989 Other specified abnormal findings of blood chemistry: Secondary | ICD-10-CM

## 2017-02-27 DIAGNOSIS — J449 Chronic obstructive pulmonary disease, unspecified: Secondary | ICD-10-CM

## 2017-02-27 DIAGNOSIS — Z79899 Other long term (current) drug therapy: Secondary | ICD-10-CM | POA: Diagnosis not present

## 2017-02-27 DIAGNOSIS — Z853 Personal history of malignant neoplasm of breast: Secondary | ICD-10-CM | POA: Diagnosis not present

## 2017-02-27 DIAGNOSIS — E2839 Other primary ovarian failure: Secondary | ICD-10-CM

## 2017-02-27 DIAGNOSIS — Z23 Encounter for immunization: Secondary | ICD-10-CM

## 2017-02-27 DIAGNOSIS — R748 Abnormal levels of other serum enzymes: Secondary | ICD-10-CM

## 2017-02-27 DIAGNOSIS — R945 Abnormal results of liver function studies: Secondary | ICD-10-CM | POA: Diagnosis not present

## 2017-02-27 DIAGNOSIS — Z0000 Encounter for general adult medical examination without abnormal findings: Secondary | ICD-10-CM | POA: Diagnosis not present

## 2017-02-27 LAB — TSH: TSH: 1.29 u[IU]/mL (ref 0.35–4.50)

## 2017-02-27 LAB — CBC WITH DIFFERENTIAL/PLATELET
BASOS ABS: 0.1 10*3/uL (ref 0.0–0.1)
Basophils Relative: 1.3 % (ref 0.0–3.0)
EOS ABS: 0.3 10*3/uL (ref 0.0–0.7)
Eosinophils Relative: 6 % — ABNORMAL HIGH (ref 0.0–5.0)
HCT: 42.1 % (ref 36.0–46.0)
Hemoglobin: 14.1 g/dL (ref 12.0–15.0)
LYMPHS ABS: 1.5 10*3/uL (ref 0.7–4.0)
Lymphocytes Relative: 31.9 % (ref 12.0–46.0)
MCHC: 33.5 g/dL (ref 30.0–36.0)
MCV: 93.3 fl (ref 78.0–100.0)
MONO ABS: 0.3 10*3/uL (ref 0.1–1.0)
Monocytes Relative: 6.6 % (ref 3.0–12.0)
NEUTROS PCT: 54.2 % (ref 43.0–77.0)
Neutro Abs: 2.6 10*3/uL (ref 1.4–7.7)
Platelets: 179 10*3/uL (ref 150.0–400.0)
RBC: 4.51 Mil/uL (ref 3.87–5.11)
RDW: 13.3 % (ref 11.5–15.5)
WBC: 4.8 10*3/uL (ref 4.0–10.5)

## 2017-02-27 LAB — HEPATIC FUNCTION PANEL
ALK PHOS: 81 U/L (ref 39–117)
ALT: 20 U/L (ref 0–35)
AST: 20 U/L (ref 0–37)
Albumin: 3.9 g/dL (ref 3.5–5.2)
BILIRUBIN TOTAL: 0.5 mg/dL (ref 0.2–1.2)
Bilirubin, Direct: 0.1 mg/dL (ref 0.0–0.3)
Total Protein: 6.6 g/dL (ref 6.0–8.3)

## 2017-02-27 LAB — LIPID PANEL
Cholesterol: 168 mg/dL (ref 0–200)
HDL: 52.9 mg/dL (ref >39.00)
LDL Cholesterol: 99 mg/dL (ref 0–99)
NONHDL: 115.33
Total CHOL/HDL Ratio: 3
Triglycerides: 81 mg/dL (ref 0.0–149.0)
VLDL: 16.2 mg/dL (ref 0.0–40.0)

## 2017-02-27 LAB — BASIC METABOLIC PANEL
BUN: 16 mg/dL (ref 6–23)
CALCIUM: 9.7 mg/dL (ref 8.4–10.5)
CHLORIDE: 105 meq/L (ref 96–112)
CO2: 30 meq/L (ref 19–32)
CREATININE: 0.82 mg/dL (ref 0.40–1.20)
GFR: 72.78 mL/min (ref >60.00)
Glucose, Bld: 90 mg/dL (ref 70–99)
Potassium: 4.4 mEq/L (ref 3.5–5.1)
Sodium: 141 mEq/L (ref 135–145)

## 2017-02-27 LAB — VITAMIN D 25 HYDROXY (VIT D DEFICIENCY, FRACTURES): VITD: 46.03 ng/mL (ref 30.00–100.00)

## 2017-02-27 NOTE — Patient Instructions (Addendum)
Will notify you  of labs when available. If all ok then yearly chick up . monitoring  Continue lifestyle intervention healthy eating and weight bearing  exercise .   Mediterranean eating activity   Get updated bone density  Vit d 1000 iu per day or as directed  Your last dexa was 2014 and was in normal range .   Can try alternating Paxil 40 with 20 mg per day ( 3 days per week ) for 3-4 weeks and then down to 20 mg per day  Call for advice   Check into shingles vaccine. Shingrix  Can receive either in the office or pharmacy depending on insurance rules.  Get bone density appt.         Why follow it? Research shows. . Those who follow the Mediterranean diet have a reduced risk of heart disease  . The diet is associated with a reduced incidence of Parkinson's and Alzheimer's diseases . People following the diet may have longer life expectancies and lower rates of chronic diseases  . The Dietary Guidelines for Americans recommends the Mediterranean diet as an eating plan to promote health and prevent disease  What Is the Mediterranean Diet?  . Healthy eating plan based on typical foods and recipes of Mediterranean-style cooking . The diet is primarily a plant based diet; these foods should make up a majority of meals   Starches - Plant based foods should make up a majority of meals - They are an important sources of vitamins, minerals, energy, antioxidants, and fiber - Choose whole grains, foods high in fiber and minimally processed items  - Typical grain sources include wheat, oats, barley, corn, brown rice, bulgar, farro, millet, polenta, couscous  - Various types of beans include chickpeas, lentils, fava beans, black beans, white beans   Fruits  Veggies - Large quantities of antioxidant rich fruits & veggies; 6 or more servings  - Vegetables can be eaten raw or lightly drizzled with oil and cooked  - Vegetables common to the traditional Mediterranean Diet include: artichokes,  arugula, beets, broccoli, brussel sprouts, cabbage, carrots, celery, collard greens, cucumbers, eggplant, kale, leeks, lemons, lettuce, mushrooms, okra, onions, peas, peppers, potatoes, pumpkin, radishes, rutabaga, shallots, spinach, sweet potatoes, turnips, zucchini - Fruits common to the Mediterranean Diet include: apples, apricots, avocados, cherries, clementines, dates, figs, grapefruits, grapes, melons, nectarines, oranges, peaches, pears, pomegranates, strawberries, tangerines  Fats - Replace butter and margarine with healthy oils, such as olive oil, canola oil, and tahini  - Limit nuts to no more than a handful a day  - Nuts include walnuts, almonds, pecans, pistachios, pine nuts  - Limit or avoid candied, honey roasted or heavily salted nuts - Olives are central to the [employer] - can be eaten whole or used in a variety of dishes   Meats Protein - Limiting red meat: no more than a few times a month - When eating red meat: choose lean cuts and keep the portion to the size of deck of cards - Eggs: approx. 0 to 4 times a week  - Fish and lean poultry: at least 2 a week  - Healthy protein sources include, chicken, [place], lean beef, lamb - Increase intake of seafood such as tuna, salmon, trout, mackerel, shrimp, scallops - Avoid or limit high fat processed meats such as sausage and bacon  Dairy - Include moderate amounts of low fat dairy products  - Focus on healthy dairy such as fat free yogurt, skim milk, low or reduced fat  cheese - Limit dairy products higher in fat such as whole or 2% milk, cheese, ice cream  Alcohol - Moderate amounts of red wine is ok  - No more than 5 oz daily for women (all ages) and men older than age 48  - No more than 10 oz of wine daily for men younger than 42  Other - Limit sweets and other desserts  - Use herbs and spices instead of salt to flavor foods  - Herbs and spices common to the traditional Mediterranean Diet include: basil, bay leaves,  chives, cloves, cumin, fennel, garlic, lavender, marjoram, mint, oregano, parsley, pepper, rosemary, sage, savory, sumac, tarragon, thyme   It's not just a diet, it's a lifestyle:  . The Mediterranean diet includes lifestyle factors typical of those in the region  . Foods, drinks and meals are best eaten with others and savored . Daily physical activity is important for overall good health . This could be strenuous exercise like running and aerobics . This could also be more leisurely activities such as walking, housework, yard-work, or taking the stairs . Moderation is the key; a balanced and healthy diet accommodates most foods and drinks . Consider portion sizes and frequency of consumption of certain foods   Meal Ideas & Options:  . Breakfast:  o Whole wheat toast or whole wheat English muffins with peanut butter & hard boiled egg o Steel cut oats topped with apples & cinnamon and skim milk  o Fresh fruit: banana, strawberries, melon, berries, peaches  o Smoothies: strawberries, bananas, greek yogurt, peanut butter o Low fat greek yogurt with blueberries and granola  o Egg white omelet with spinach and mushrooms o Breakfast couscous: whole wheat couscous, apricots, skim milk, cranberries  . Sandwiches:  o Hummus and grilled vegetables (peppers, zucchini, squash) on whole wheat bread   o Grilled chicken on whole wheat pita with lettuce, tomatoes, cucumbers or tzatziki  o Tuna salad on whole wheat bread: tuna salad made with greek yogurt, olives, red peppers, capers, green onions o Garlic rosemary lamb pita: lamb sauted with garlic, rosemary, salt & pepper; add lettuce, cucumber, greek yogurt to pita - flavor with lemon juice and black pepper  . Seafood:  o Mediterranean grilled salmon, seasoned with garlic, basil, parsley, lemon juice and black pepper o Shrimp, lemon, and spinach whole-grain pasta salad made with low fat greek yogurt  o Seared scallops with lemon orzo  o Seared tuna  steaks seasoned salt, pepper, coriander topped with tomato mixture of olives, tomatoes, olive oil, minced garlic, parsley, green onions and cappers  . Meats:  o Herbed greek chicken salad with kalamata olives, cucumber, feta  o Red bell peppers stuffed with spinach, bulgur, lean ground beef (or lentils) & topped with feta   o Kebabs: skewers of chicken, tomatoes, onions, zucchini, squash  o [place] burgers: made with red onions, mint, dill, lemon juice, feta cheese topped with roasted red peppers . Vegetarian o Cucumber salad: cucumbers, artichoke hearts, celery, red onion, feta cheese, tossed in olive oil & lemon juice  o Hummus and whole grain pita points with a greek salad (lettuce, tomato, feta, olives, cucumbers, red onion) o Lentil soup with celery, carrots made with vegetable broth, garlic, salt and pepper  o Tabouli salad: parsley, bulgur, mint, scallions, cucumbers, tomato, radishes, lemon juice, olive oil, salt and pepper.        Bone Health Bones protect organs, store calcium, and anchor muscles. Good health habits, such as eating nutritious foods and  exercising regularly, are important for maintaining healthy bones. They can also help to prevent a condition that causes bones to lose density and become weak and brittle (osteoporosis). Why is bone mass important? Bone mass refers to the amount of bone tissue that you have. The higher your bone mass, the stronger your bones. An important step toward having healthy bones throughout life is to have strong and dense bones during childhood. A young adult who has a high bone mass is more likely to have a high bone mass later in life. Bone mass at its greatest it is called peak bone mass. A large decline in bone mass occurs in older adults. In women, it occurs about the time of menopause. During this time, it is important to practice good health habits, because if more bone is lost than what is replaced, the bones will become less healthy and  more likely to break (fracture). If you find that you have a low bone mass, you may be able to prevent osteoporosis or further bone loss by changing your diet and lifestyle. How can I find out if my bone mass is low? Bone mass can be measured with an X-ray test that is called a bone mineral density (BMD) test. This test is recommended for all women who are age 12 or older. It may also be recommended for men who are age 12 or older, or for people who are more likely to develop osteoporosis due to:  Having bones that break easily.  Having a long-term disease that weakens bones, such as kidney disease or rheumatoid arthritis.  Having menopause earlier than normal.  Taking medicine that weakens bones, such as steroids, thyroid hormones, or hormone treatment for breast cancer or prostate cancer.  Smoking.  Drinking three or more alcoholic drinks each day.  What are the nutritional recommendations for healthy bones? To have healthy bones, you need to get enough of the right minerals and vitamins. Most nutrition experts recommend getting these nutrients from the foods that you eat. Nutritional recommendations vary from person to person. Ask your health care provider what is healthy for you. Here are some general guidelines. Calcium Recommendations Calcium is the most important (essential) mineral for bone health. Most people can get enough calcium from their diet, but supplements may be recommended for people who are at risk for osteoporosis. Good sources of calcium include:  Dairy products, such as low-fat or nonfat milk, cheese, and yogurt.  Dark green leafy vegetables, such as bok choy and broccoli.  Calcium-fortified foods, such as orange juice, cereal, bread, soy beverages, and tofu products.  Nuts, such as almonds.  Follow these recommended amounts for daily calcium intake:  Children, age 1?3: 700 mg.  Children, age 65?8: 1,000 mg.  Children, age 656?13: 1,300 mg.  Teens, age 658?18:  1,300 mg.  Adults, age 652?50: 1,000 mg.  Adults, age 52?70: ? Men: 1,000 mg. ? Women: 1,200 mg.  Adults, age 73 or older: 1,200 mg.  Pregnant and breastfeeding females: ? Teens: 1,300 mg. ? Adults: 1,000 mg.  Vitamin D Recommendations Vitamin D is the most essential vitamin for bone health. It helps the body to absorb calcium. Sunlight stimulates the skin to make vitamin D, so be sure to get enough sunlight. If you live in a cold climate or you do not get outside often, your health care provider may recommend that you take vitamin D supplements. Good sources of vitamin D in your diet include:  Egg yolks.  Saltwater fish.  Milk and cereal fortified with vitamin D.  Follow these recommended amounts for daily vitamin D intake:  Children and teens, age 35?18: 86 international units.  Adults, age 60 or younger: 400-800 international units.  Adults, age 17 or older: 800-1,000 international units.  Other Nutrients Other nutrients for bone health include:  Phosphorus. This mineral is found in meat, poultry, dairy foods, nuts, and legumes. The recommended daily intake for adult men and adult women is 700 mg.  Magnesium. This mineral is found in seeds, nuts, dark green vegetables, and legumes. The recommended daily intake for adult men is 400?420 mg. For adult women, it is 310?320 mg.  Vitamin K. This vitamin is found in green leafy vegetables. The recommended daily intake is 120 mg for adult men and 90 mg for adult women.  What type of physical activity is best for building and maintaining healthy bones? Weight-bearing and strength-building activities are important for building and maintaining peak bone mass. Weight-bearing activities cause muscles and bones to work against gravity. Strength-building activities increases muscle strength that supports bones. Weight-bearing and muscle-building activities include:  Walking and hiking.  Jogging and running.  Dancing.  Gym  exercises.  Lifting weights.  Tennis and racquetball.  Climbing stairs.  Aerobics.  Adults should get at least 30 minutes of moderate physical activity on most days. Children should get at least 60 minutes of moderate physical activity on most days. Ask your health care provide what type of exercise is best for you. Where can I find more information? For more information, check out the following websites:  [place]: [employer].se  [employer] of Health: http://www.niams.AnonymousEar.fr.asp  This information is not intended to replace advice given to you by your health care provider. Make sure you discuss any questions you have with your health care provider. Document Released: 09/17/2003 Document Revised: 01/15/2016 Document Reviewed: 07/02/2014 Elsevier Interactive Patient Education  [employer].

## 2017-03-07 ENCOUNTER — Ambulatory Visit (INDEPENDENT_AMBULATORY_CARE_PROVIDER_SITE_OTHER)
Admission: RE | Admit: 2017-03-07 | Discharge: 2017-03-07 | Disposition: A | Discharge status: Home / Self Care | Payer: Medicare Other | Source: Ambulatory Visit | Attending: Internal Medicine | Admitting: Internal Medicine

## 2017-03-07 DIAGNOSIS — E039 Hypothyroidism, unspecified: Secondary | ICD-10-CM

## 2017-03-07 DIAGNOSIS — Z853 Personal history of malignant neoplasm of breast: Secondary | ICD-10-CM

## 2017-03-07 DIAGNOSIS — E2839 Other primary ovarian failure: Secondary | ICD-10-CM

## 2017-03-07 DIAGNOSIS — Z79899 Other long term (current) drug therapy: Secondary | ICD-10-CM

## 2017-03-20 ENCOUNTER — Telehealth: Payer: Self-pay | Admitting: Emergency Medicine

## 2017-03-20 NOTE — Telephone Encounter (Signed)
Spoke with patient regarding bone density results. Patient understood and had no further questions .

## 2017-03-31 ENCOUNTER — Encounter: Payer: Self-pay | Admitting: Internal Medicine

## 2017-07-14 ENCOUNTER — Telehealth: Payer: Self-pay | Admitting: Allergy and Immunology

## 2017-07-14 NOTE — Telephone Encounter (Signed)
Pt came in and thought she had appointment today and we did not have her in the system to be seen and we were going to put her in and she said that she did not have time to just set and wait because she is leaving to go out of town tomorrow.

## 2017-07-16 ENCOUNTER — Other Ambulatory Visit: Payer: Self-pay | Admitting: Internal Medicine

## 2017-08-18 ENCOUNTER — Encounter: Payer: Self-pay | Admitting: Internal Medicine

## 2017-08-18 ENCOUNTER — Ambulatory Visit: Payer: Medicare Other | Admitting: Internal Medicine

## 2017-08-18 VITALS — BP 140/74 | HR 80 | Ht 68.5 in | Wt 159.8 lb

## 2017-08-18 DIAGNOSIS — R062 Wheezing: Secondary | ICD-10-CM

## 2017-08-18 DIAGNOSIS — J45909 Unspecified asthma, uncomplicated: Secondary | ICD-10-CM

## 2017-08-18 DIAGNOSIS — R0609 Other forms of dyspnea: Secondary | ICD-10-CM | POA: Diagnosis not present

## 2017-08-18 DIAGNOSIS — R059 Cough, unspecified: Secondary | ICD-10-CM

## 2017-08-18 DIAGNOSIS — R05 Cough: Secondary | ICD-10-CM | POA: Diagnosis not present

## 2017-08-18 LAB — NITRIC OXIDE: Nitric Oxide: 58

## 2017-08-18 MED ORDER — FLUTICASONE FUROATE-VILANTEROL 100-25 MCG/INH IN AEPB: 1.0000 | INHALATION_SPRAY | Freq: Every day | RESPIRATORY_TRACT | 0 refills | Status: DC

## 2017-08-18 NOTE — Progress Notes (Signed)
Subjective:    Patient ID: Jasmine Neal, female    DOB: [birth year], 72 y.o.   MRN: [id number]  PCP Panosh, Standley Brooking, MD   HPI  IOV 08/18/2017  Chief Complaint  Patient presents with  . pulmonary consult    SOB with activity wheezing with exercise and a cough in the morning.    72 year old female whose husband died seen in 10-15-2013 for a lung nodule and he subsequently died from glioblastoma multiform A.  She is here as a new consult.  Her issues that she has had over 10 years of shortness of breath with exertion relieved by rest.  Insidious onset.  Slowly getting worse.  She also has cough with early morning wheezing.  The cough is dry but occasionally she has congestion.  Symptoms are indeed progressive.  Several years ago she was on Symbicort or Advair.  She does not remember if this improved her.  Then a few years ago she got changed to [facility] along with Asmanex.  6 months ago the Asmanex was stopped.  Overall these changes have not helped her.  She has learned to live with this. Vacuuming can  cause problems symptoms .  There is no associated chest pain.  She does not have hypertension or diabetes.  She is worried about the risk of lung cancer.  There is because a paternal grandfather and sister died from lung cancer.  Lab review shows that she has eosinophilia that is mild at 300 cells per cubic millimeter.  She has had previous allergy testing that was positive done by Dr. Neldon Mc.  Last CT scan of the chest was in October 15, 2004.  In May 2018 she had spirometry at the outside.  I personally visualized the trace.  FEV1 0.9 L / 38% FVC 46% with an obstructed ratio.  Exam nitric oxide today is elevated at 58 ppb. IN terms of smoking history, she smoke 1 ppd x 10 years but quit when she was 27  Asthma Control Panel eos 300 always - last 2016/10/15 08/18/2017   Current Med Regimen anoro  ACQ 5 point- 1 week. wtd avg score. <1.0 is good control 0.75-1.25 is grey zone. >1.25 poor control. Delta 0.5 is clinically  meaningful 12/5 = 2.4  FeNO ppB 58  FeV1  0.96L/38% in Mary 2018 outside  Planned intervention  for visit Change anoro to breo low dose      has a past medical history of Asthma, Breast cancer ([place]), Chronic airway obstruction, not elsewhere classified, Dysplasia, Fatty liver, Fibromyalgia, nonmelanoma skin cancer, Hyperlipidemia, Hypothyroidism, and Osteoarthritis.   reports that she quit smoking about 43 years ago. Her smoking use included cigarettes. She has a 8.00 pack-year smoking history. she has never used smokeless tobacco.  Past Surgical History:  Procedure Laterality Date  . BREAST LUMPECTOMY     on right, 1990 IIA radiation cmfp ajuction rx  . KNEE SURGERY    . NASAL SINUS SURGERY     on right done by Dr. Wilburn Cornelia    Allergies  Allergen Reactions  . Hydrocodone Itching    Face swelled up    Immunization History  Administered Date(s) Administered  . Influenza Whole 04/10/2002, 06/01/2009, 05/05/2010, 05/22/2012, 02/20/2017  . Influenza, High Dose Seasonal PF 04/22/2016  . Influenza,inj,Quad PF,6+ Mos 06/04/2013  . Influenza-Unspecified 05/23/2011  . Pneumococcal Conjugate-13 10/09/2013  . Pneumococcal Polysaccharide-23 05/05/2010  . Td 08/11/2006, 02/27/2017  . Zoster 09/11/2012    Family History  Problem Relation Age of Onset  .  Other Father        mysthenia gravis  . Lung cancer Sister   . Osteoporosis Unknown   . Thyroid disease Unknown   . Breast cancer Mother 7       possible uterine cancer diagnosis at age 18  . Osteoporosis Mother        no hip fracture  . Breast cancer Sister 34  . Lung cancer Sister 96       former smoker  . Lung cancer Paternal Grandmother      Current Outpatient Medications:  .  acetaminophen (TYLENOL) 500 MG tablet, Take 1,000 mg by mouth every 6 (six) hours as needed. pain , Disp: , Rfl:  .  ANORO ELLIPTA 62.5-25 MCG/INH AEPB, USE ONE INHALATION EVERY  MORNING , RINSE MOUTH AFTER USE, Disp: 84 each, Rfl: 3 .   cholecalciferol (VITAMIN D) 1000 UNITS tablet, Take 1,000 Units by mouth daily., Disp: , Rfl:  .  cyanocobalamin (TH VITAMIN B12) 100 MCG tablet, Take by mouth., Disp: , Rfl:  .  PARoxetine (PAXIL) 40 MG tablet, TAKE 1 TABLET BY MOUTH AT  BEDTIME, Disp: 90 tablet, Rfl: 1 .  SYNTHROID 88 MCG tablet, TAKE 1 TABLET BY MOUTH  DAILY, Disp: 90 tablet, Rfl: 1 .  valACYclovir (VALTREX) 1000 MG tablet, TAKE 2 TABLETS BY MOUTH TWICE DAILY AS DIRECTED, Disp: 30 tablet, Rfl: 0 .  aspirin 81 MG tablet, Take 81 mg by mouth daily., Disp: , Rfl:  .  diphenhydramine-acetaminophen (TYLENOL PM) 25-500 MG TABS, Take 1 tablet by mouth at bedtime as needed. sleep, Disp: , Rfl:     Review of Systems  Constitutional: Negative for fever and unexpected weight change.  HENT: Positive for rhinorrhea and sinus pressure. Negative for congestion, dental problem, ear pain, nosebleeds, postnasal drip, sneezing, sore throat and trouble swallowing.   Eyes: Positive for redness and itching.  Respiratory: Positive for cough, chest tightness, shortness of breath and wheezing.   Cardiovascular: Negative for palpitations and leg swelling.  Gastrointestinal: Negative for nausea and vomiting.  Genitourinary: Negative for dysuria.  Musculoskeletal: Positive for joint swelling.  Skin: Negative for rash.  Allergic/Immunologic: Positive for environmental allergies. Negative for food allergies and immunocompromised state.  Neurological: Negative for headaches.  Hematological: Bruises/bleeds easily.  Psychiatric/Behavioral: Negative for dysphoric mood. The patient is not nervous/anxious.        Objective:   Physical Exam  Constitutional: She is oriented to person, place, and time. She appears well-developed and well-nourished. No distress.  HENT:  Head: Normocephalic and atraumatic.  Right Ear: External ear normal.  Left Ear: External ear normal.  Mouth/Throat: Oropharynx is clear and moist. No oropharyngeal exudate.  Eyes:  Conjunctivae and EOM are normal. Pupils are equal, round, and reactive to light. Right eye exhibits no discharge. Left eye exhibits no discharge. No scleral icterus.  Neck: Normal range of motion. Neck supple. No JVD present. No tracheal deviation present. No thyromegaly present.  Cardiovascular: Normal rate, regular rhythm, normal heart sounds and intact distal pulses. Exam reveals no gallop and no friction rub.  No murmur heard. Pulmonary/Chest: Effort normal and breath sounds normal. No respiratory distress. She has no wheezes. She has no rales. She exhibits no tenderness.  Abdominal: Soft. Bowel sounds are normal. She exhibits no distension and no mass. There is no tenderness. There is no rebound and no guarding.  Musculoskeletal: Normal range of motion. She exhibits no edema or tenderness.  Lymphadenopathy:    She has no cervical adenopathy.  Neurological:  She is alert and oriented to person, place, and time. She has normal reflexes. No cranial nerve deficit. She exhibits normal muscle tone. Coordination normal.  Skin: Skin is warm and dry. No rash noted. She is not diaphoretic. No erythema. No pallor.  Psychiatric: She has a normal mood and affect. Her behavior is normal. Judgment and thought content normal.  Vitals reviewed.   Vitals:   08/18/17 0901  BP: 140/74  Pulse: 80  SpO2: 97%  Weight: 72.5 kg (159 lb 12.8 oz)  Height: 5' 8.5" (1.74 m)         Assessment & Plan:     ICD-10-CM   1. Uncomplicated asthma, unspecified asthma severity, unspecified whether persistent J45.909   2. Wheezing R06.2 Nitric oxide  3. Cough R05 Nitric oxide  4. Dyspnea on exertion R06.09      First issue is asthma  Plan - stop anoro - start low dose breo 1 puff daily - 4 week sample supply if we have it  - use albuterol as needed  Followup  - 4 weeks do PFT breathing test  - return to see me at 4 weeks but after PFT; to assess response and to consider additional workup es if symptoms  still persist.   - additional workup could include cbc with diff, IgE, blood allergy panel, HRCT Chest and consideration of additinoal add-on Rx (she asked about CT scan because of family hx of lung cancer but wanted to hold off; wanted to see breo first approach) - return sooner if needed    Dr. Brand Males, M.D., [facility].C.P Pulmonary and Critical Care Medicine Staff Physician, [place] Director - Interstitial Lung Disease  Program  Pulmonary [facility] [place], [place], [address]  Pager: [phone], If no answer or between  15:00h - 7:00h: call [phone] Telephone: [phone]

## 2017-08-18 NOTE — Patient Instructions (Addendum)
ICD-10-CM   1. Uncomplicated asthma, unspecified asthma severity, unspecified whether persistent J45.909   2. Wheezing R06.2 Nitric oxide  3. Cough R05 Nitric oxide  4. Dyspnea on exertion R06.09    First issue is asthma  Plan - stop anoro - start low dose breo 1 puff daily - 4 week sample supply if we have it  - use albuterol as needed  Followup  - 4 weeks do PFT breathing test  - return to see me at 4 weeks but after PFT; to assess response and to consider additional workup es if symptoms still persist.   - additional workup could include cbc with diff, IgE, blood allergy panel, HRCT Chest - return sooner if needed

## 2017-08-24 ENCOUNTER — Other Ambulatory Visit: Payer: Self-pay | Admitting: Internal Medicine

## 2017-08-25 NOTE — Telephone Encounter (Signed)
Called patient to verify dosing of her Paxil.  Per last OV pt was to taper down to 20mg  daily dose from 40mg .  Current refill request is for 40mg  daily. LM for patient to see what her current dose is of this medication so we can send the correct Rx to the pharmacy.

## 2017-08-28 ENCOUNTER — Telehealth: Payer: Self-pay | Admitting: Internal Medicine

## 2017-08-28 MED ORDER — FLUTICASONE FUROATE-VILANTEROL 100-25 MCG/INH IN AEPB: 1.0000 | INHALATION_SPRAY | Freq: Every day | RESPIRATORY_TRACT | 0 refills | Status: DC

## 2017-08-28 NOTE — Telephone Encounter (Signed)
Pt requesting sample of Breo 100.  This has been left up front for pickup.  Nothing further needed.

## 2017-08-29 NOTE — Telephone Encounter (Signed)
Will send to Dr Regis Bill to okay the 40mg  Paxil refill.  Pt does not want to taper as directed by Dr Regis Bill at last OV (see below)  Please advise Dr Regis Bill, thanks.

## 2017-08-29 NOTE — Telephone Encounter (Signed)
Pt called back to request the  PARoxetine (PAXIL) 40 MG tablet Be kept at the 40 mg.  Pt does NOT want to taper to 20 mg as she stated earlier.  Pt doesn't want to adjust this med at this time since she is starting a new asthma med. Doesn't want to change all this at once.  [place], [place] [employer] [phone] (Phone) [phone] (Fax)

## 2017-08-29 NOTE — Telephone Encounter (Signed)
Pt called back and verify she is taking the 20mg 

## 2017-08-30 NOTE — Telephone Encounter (Signed)
Pt aware that rx refilled to [facility] Nothing further needed.

## 2017-09-04 ENCOUNTER — Telehealth: Payer: Self-pay | Admitting: *Deleted

## 2017-09-04 NOTE — Telephone Encounter (Signed)
Called patient and advised her to go by the health department's recommendations on what vaccines she would need for the country she is traveling to.  Informed patient that she can get the Shingrix vaccine at the pharmacy, as her insurance does not usually cover it in the office.  She also wondered if she should have Tdap vaccine (last received plain Td in Aug. 2018). Advised patient that per CDC recommendations, plain Td is given every 10 years after patient has received a dose of Tdap. No further needs identified at time of call.

## 2017-09-04 NOTE — Telephone Encounter (Signed)
Copied from [place] [id number]. Topic: Inquiry >> Sep 01, 2017 11:20 AM Margot Ables wrote: Reason for CRM: Pt is planning a trip to [place] and is requesting a call back to discuss her immunizations. [facility]. Pt also asking if she needs the new shingles shot and if in stock.

## 2017-09-22 ENCOUNTER — Other Ambulatory Visit (INDEPENDENT_AMBULATORY_CARE_PROVIDER_SITE_OTHER): Payer: Medicare Other

## 2017-09-22 ENCOUNTER — Ambulatory Visit (INDEPENDENT_AMBULATORY_CARE_PROVIDER_SITE_OTHER): Payer: Medicare Other | Admitting: Internal Medicine

## 2017-09-22 ENCOUNTER — Ambulatory Visit: Payer: Medicare Other | Admitting: Internal Medicine

## 2017-09-22 ENCOUNTER — Encounter: Payer: Self-pay | Admitting: Internal Medicine

## 2017-09-22 VITALS — BP 130/62 | HR 68 | Ht 68.25 in | Wt 159.0 lb

## 2017-09-22 DIAGNOSIS — J4540 Moderate persistent asthma, uncomplicated: Secondary | ICD-10-CM

## 2017-09-22 DIAGNOSIS — J45909 Unspecified asthma, uncomplicated: Secondary | ICD-10-CM

## 2017-09-22 DIAGNOSIS — R942 Abnormal results of pulmonary function studies: Secondary | ICD-10-CM

## 2017-09-22 DIAGNOSIS — R0609 Other forms of dyspnea: Secondary | ICD-10-CM

## 2017-09-22 LAB — CBC WITH DIFFERENTIAL/PLATELET
BASOS PCT: 0.5 % (ref 0.0–3.0)
Basophils Absolute: 0 10*3/uL (ref 0.0–0.1)
EOS PCT: 2.1 % (ref 0.0–5.0)
Eosinophils Absolute: 0.1 10*3/uL (ref 0.0–0.7)
HCT: 42.8 % (ref 36.0–46.0)
Hemoglobin: 14.2 g/dL (ref 12.0–15.0)
LYMPHS ABS: 1.8 10*3/uL (ref 0.7–4.0)
Lymphocytes Relative: 30.1 % (ref 12.0–46.0)
MCHC: 33.2 g/dL (ref 30.0–36.0)
MCV: 92.6 fl (ref 78.0–100.0)
MONO ABS: 0.4 10*3/uL (ref 0.1–1.0)
MONOS PCT: 6.4 % (ref 3.0–12.0)
NEUTROS PCT: 60.9 % (ref 43.0–77.0)
Neutro Abs: 3.6 10*3/uL (ref 1.4–7.7)
Platelets: 176 10*3/uL (ref 150.0–400.0)
RBC: 4.63 Mil/uL (ref 3.87–5.11)
RDW: 13.7 % (ref 11.5–15.5)
WBC: 5.9 10*3/uL (ref 4.0–10.5)

## 2017-09-22 LAB — PULMONARY FUNCTION TEST
DL/VA % PRED: 96 %
DL/VA: 5.1 ml/min/mmHg/L
DLCO UNC % PRED: 61 %
DLCO unc: 18.33 ml/min/mmHg
FEF 25-75 POST: 0.37 L/s
FEF 25-75 PRE: 0.47 L/s
FEF2575-%CHANGE-POST: -21 %
FEF2575-%PRED-POST: 18 %
FEF2575-%PRED-PRE: 23 %
FEV1-%Change-Post: -28 %
FEV1-%Pred-Post: 30 %
FEV1-%Pred-Pre: 42 %
FEV1-PRE: 1.11 L
FEV1-Post: 0.79 L
FEV1FVC-%CHANGE-POST: -21 %
FEV1FVC-%PRED-PRE: 65 %
FEV6-%Change-Post: -7 %
FEV6-%PRED-PRE: 65 %
FEV6-%Pred-Post: 60 %
FEV6-Post: 1.98 L
FEV6-Pre: 2.15 L
FEV6FVC-%Change-Post: 0 %
FEV6FVC-%Pred-Post: 100 %
FEV6FVC-%Pred-Pre: 100 %
FVC-%CHANGE-POST: -8 %
FVC-%PRED-POST: 59 %
FVC-%PRED-PRE: 65 %
FVC-POST: 2.05 L
FVC-PRE: 2.24 L
POST FEV1/FVC RATIO: 39 %
POST FEV6/FVC RATIO: 97 %
PRE FEV1/FVC RATIO: 49 %
Pre FEV6/FVC Ratio: 96 %
RV % pred: 181 %
RV: 4.45 L
TLC % pred: 119 %
TLC: 6.8 L

## 2017-09-22 NOTE — Patient Instructions (Addendum)
ICD-10-CM   1. Dyspnea on exertion R06.09   2. Abnormal PFT R94.2   3. Moderate persistent asthma without complication L59.74     Get HRCT supine and prone Get blood IgE and cbc with diff for eosinophil load Continue breo - glad is helping Use albuterol as needed Please talk to PCP Panosh, Standley Brooking, MD -  and ensure you get  shingarix vaccine    Followup  - will call with results of CT and blood work; and if additional testing or findings will explain  - otherwise return in 3-6 months to see Dr Chase Caller  - ACQ at followup

## 2017-09-22 NOTE — Progress Notes (Signed)
Subjective:     Patient ID: ELLIANAH Neal, female   DOB: 1945-07-31, 72 y.o.   MRN: [id number]  HPI PCP Panosh, Standley Brooking, MD   HPI  IOV 08/18/2017  Chief Complaint  Patient presents with  . pulmonary consult    SOB with activity wheezing with exercise and a cough in the morning.    72 year old female whose husband died seen in 10-19-2013 for a lung nodule and he subsequently died from glioblastoma multiform A.  She is here as a new consult.  Her issues that she has had over 10 years of shortness of breath with exertion relieved by rest.  Insidious onset.  Slowly getting worse.  She also has cough with early morning wheezing.  The cough is dry but occasionally she has congestion.  Symptoms are indeed progressive.  Several years ago she was on Symbicort or Advair.  She does not remember if this improved her.  Then a few years ago she got changed to [facility] along with Asmanex.  6 months ago the Asmanex was stopped.  Overall these changes have not helped her.  She has learned to live with this. Vacuuming can  cause problems symptoms .  There is no associated chest pain.  She does not have hypertension or diabetes.  She is worried about the risk of lung cancer.  There is because a paternal grandfather and sister died from lung cancer.  Lab review shows that she has eosinophilia that is mild at 300 cells per cubic millimeter.  She has had previous allergy testing that was positive done by Dr. Neldon Mc.  Last CT scan of the chest was in 10-19-04.  In May 2018 she had spirometry at the outside.  I personally visualized the trace.  FEV1 0.9 L / 38% FVC 46% with an obstructed ratio.  Exam nitric oxide today is elevated at 58 ppb. IN terms of smoking history, she smoke 1 ppd x 10 years but quit when she was 27.,> reports allrgy test 10 years ago as positive only for dust mites   OV 09/22/2017  Chief Complaint  Patient presents with  . Follow-up    PFT done, no asthma flare at this time    Jasmine Neal returns for  follow-up.  She tells me that after starting Brio her symptoms are significantly improved.  In fact asthma control questionnaire shows a symptom score of 0.5.  She is now waking up at night because of asthma symptoms.  When she wakes up she has no symptoms.  She feels very slightly limited in her activities only when walking uphill.  And she is actually has a very little shortness of breath but again only while walking uphill or climbing stairs.  She has never wheezed and not use albuterol for rescue at all.  Overall is a huge improvement. Walking desaturation test on 09/22/2017 185 feet x 3 laps on ROOM AIR:  did no desaturate. Rest pulse ox was 100%, final pulse ox was 97%. HR response 69/min at rest to 93/min at peak exertion. Patient Jasmine Neal  Did not Desaturate < 88% . Jasmine Neal yes  Desaturated </= 3% points. Jasmine Neal yes did get tachyardic.  But she did drop pulse ox by 3 points.  In talking to her she tells me that she is got this fixed exertional dyspnea that is relieved by rest.  This may be slowly progressive.  There is no cough or wheezing.  She had pulmonary function test  today and this shows significant severe obstruction and on bronchodilator responsiveness flow volume loop abnormalities.  The DLCO was also reduced at 60%.  She is very surprised that her lung function abnormalities also severe because she does not feel the symptoms as much and she under perceives the symptoms relative to the lung function abnormality.  She is very keen on getting a CT scan of the chest because of family history of lung cancer.   Asthma Control Panel eos 300 always - last 2018 08/18/2017  09/22/2017   Current Med Regimen anoro breo  ACQ 5 point- 1 week. wtd avg score. <1.0 is good control 0.75-1.25 is grey zone. >1.25 poor control. Delta 0.5 is clinically meaningful 12/5 = 2.4 0.5  FeNO ppB 58   FeV1  0.96L/38% in May 2018 outside 1.11/425% -> 0.79/30% ratop 65 -> 39, DLCO 18.33/61%  Planned  intervention  for visit Change anoro to breo low dose breo + get HRCT + blood allergy panel        has a past medical history of Asthma, Breast cancer ([place]), Chronic airway obstruction, not elsewhere classified, Dysplasia, Fatty liver, Fibromyalgia, nonmelanoma skin cancer, Hyperlipidemia, Hypothyroidism, and Osteoarthritis.   reports that she quit smoking about 43 years ago. Her smoking use included cigarettes. She has a 8.00 pack-year smoking history. she has never used smokeless tobacco.  Past Surgical History:  Procedure Laterality Date  . BREAST LUMPECTOMY     on right, 1990 IIA radiation cmfp ajuction rx  . KNEE SURGERY    . NASAL SINUS SURGERY     on right done by Dr. Wilburn Cornelia    Allergies  Allergen Reactions  . Hydrocodone Itching    Face swelled up    Immunization History  Administered Date(s) Administered  . Influenza Whole 04/10/2002, 06/01/2009, 05/05/2010, 05/22/2012, 02/20/2017  . Influenza, High Dose Seasonal PF 04/22/2016  . Influenza,inj,Quad PF,6+ Mos 06/04/2013  . Influenza-Unspecified 05/23/2011  . Pneumococcal Conjugate-13 10/09/2013  . Pneumococcal Polysaccharide-23 05/05/2010  . Td 08/11/2006, 02/27/2017  . Zoster 09/11/2012    Family History  Problem Relation Age of Onset  . Other Father        mysthenia gravis  . Lung cancer Sister   . Osteoporosis Unknown   . Thyroid disease Unknown   . Breast cancer Mother 45       possible uterine cancer diagnosis at age 9  . Osteoporosis Mother        no hip fracture  . Breast cancer Sister 43  . Lung cancer Sister 82       former smoker  . Lung cancer Paternal Grandmother      Current Outpatient Medications:  .  acetaminophen (TYLENOL) 500 MG tablet, Take 1,000 mg by mouth every 6 (six) hours as needed. pain , Disp: , Rfl:  .  cholecalciferol (VITAMIN D) 1000 UNITS tablet, Take 1,000 Units by mouth daily., Disp: , Rfl:  .  cyanocobalamin (TH VITAMIN B12) 100 MCG tablet, Take by mouth., Disp: ,  Rfl:  .  diphenhydramine-acetaminophen (TYLENOL PM) 25-500 MG TABS, Take 1 tablet by mouth at bedtime as needed. sleep, Disp: , Rfl:  .  fluticasone furoate-vilanterol (BREO ELLIPTA) 100-25 MCG/INH AEPB, Inhale 1 puff into the lungs daily., Disp: 2 each, Rfl: 0 .  PARoxetine (PAXIL) 40 MG tablet, TAKE 1 TABLET BY MOUTH AT  BEDTIME, Disp: 90 tablet, Rfl: 1 .  SYNTHROID 88 MCG tablet, TAKE 1 TABLET BY MOUTH  DAILY, Disp: 90 tablet, Rfl: 1 .  valACYclovir (VALTREX) 1000 MG tablet, TAKE 2 TABLETS BY MOUTH TWICE DAILY AS DIRECTED, Disp: 30 tablet, Rfl: 0   Review of Systems     Objective:   Physical Exam Vitals:   09/22/17 1146  BP: 130/62  Pulse: 68  SpO2: 95%    Discussion only visit    Assessment:       ICD-10-CM   1. Dyspnea on exertion R06.09   2. Abnormal PFT R94.2   3. Moderate persistent asthma without complication N23.55    Concerned she has emphysema obstruction and pulse ox drop to 3 points with low DLCO. Doubt co-exisstnt ILD but possible. Worth getting HRCT. Will also assess for complicated asthma with Ig# E and blood eos     Plan:      Get HRCT supine and prone Get blood IgE and cbc with diff for eosinophil load Continue breo - glad is helping Use albuterol as needed Please talk to PCP Panosh, Standley Brooking, MD -  and ensure you get  shingarix vaccine    Followup  - will call with results of CT and blood work; and if additional testing or findings will explain  - otherwise return in 3-6 months to see Dr Chase Caller  - ACQ at followup  > 50% of this > 25 min visit spent in face to face counseling or coordination of care    Dr. Brand Males, M.D., [facility].C.P Pulmonary and Critical Care Medicine Staff Physician, [facility] Director - Interstitial Lung Disease  Program  [facility] [place] [facility] [place], [place], [address]  Pager: [phone], If no answer or between  15:00h - 7:00h: call [phone] Telephone: [phone]

## 2017-09-22 NOTE — Progress Notes (Signed)
Patient completed full PFT today. 

## 2017-09-25 ENCOUNTER — Telehealth: Payer: Self-pay | Admitting: Internal Medicine

## 2017-09-25 LAB — IGE: IgE (Immunoglobulin E), Serum: 8 kU/L (ref <=114)

## 2017-09-25 MED ORDER — FLUTICASONE FUROATE-VILANTEROL 100-25 MCG/INH IN AEPB: 1.0000 | INHALATION_SPRAY | Freq: Every day | RESPIRATORY_TRACT | 3 refills | Status: DC

## 2017-09-25 MED ORDER — FLUTICASONE FUROATE-VILANTEROL 100-25 MCG/INH IN AEPB: 1.0000 | INHALATION_SPRAY | Freq: Every day | RESPIRATORY_TRACT | 0 refills | Status: DC

## 2017-09-25 NOTE — Telephone Encounter (Signed)
Rx for Breo 100 has been sent to [facility] for 30 day supply and 90 day supply to [facility]. Lm to make pt aware.

## 2017-09-25 NOTE — Telephone Encounter (Signed)
Nothing further needed 

## 2017-09-25 NOTE — Telephone Encounter (Signed)
Called patient unable to reach left message to give us a call back.

## 2017-09-25 NOTE — Telephone Encounter (Signed)
MR please advise patient is requesting results from [place] on 3.15.19

## 2017-09-25 NOTE — Telephone Encounter (Signed)
Patient called back - confirmed that meds were sent - pt needs nothing further -pr

## 2017-09-25 NOTE — Telephone Encounter (Signed)
Blood eos and IgE are normal suggesting that breo should work well for her w . Await CT result

## 2017-09-26 NOTE — Telephone Encounter (Signed)
lmtcb x2 for pt. 

## 2017-09-27 NOTE — Telephone Encounter (Signed)
ATC pt, no answer. Left message for pt to call back.  

## 2017-09-27 NOTE — Telephone Encounter (Signed)
Pt is calling back [phone]

## 2017-09-27 NOTE — Telephone Encounter (Signed)
Spoke with pt and notified of results per Dr. Ramaswamy. Pt verbalized understanding and denied any questions.  

## 2017-10-03 ENCOUNTER — Ambulatory Visit (INDEPENDENT_AMBULATORY_CARE_PROVIDER_SITE_OTHER)
Admission: RE | Admit: 2017-10-03 | Discharge: 2017-10-03 | Disposition: A | Discharge status: Home / Self Care | Payer: Medicare Other | Source: Ambulatory Visit | Attending: Internal Medicine | Admitting: Internal Medicine

## 2017-10-03 DIAGNOSIS — R0609 Other forms of dyspnea: Secondary | ICD-10-CM | POA: Diagnosis not present

## 2017-10-03 DIAGNOSIS — R942 Abnormal results of pulmonary function studies: Secondary | ICD-10-CM | POA: Diagnosis not present

## 2017-10-05 ENCOUNTER — Telehealth: Payer: Self-pay | Admitting: Internal Medicine

## 2017-10-05 DIAGNOSIS — I2510 Atherosclerotic heart disease of native coronary artery without angina pectoris: Secondary | ICD-10-CM

## 2017-10-05 DIAGNOSIS — R918 Other nonspecific abnormal finding of lung field: Secondary | ICD-10-CM

## 2017-10-05 NOTE — Telephone Encounter (Signed)
CT cherst  1.  does have coronary artery calcification and if no normal cardiac stress test past few years; please refer to cardiologist - [facility] or Dr Einar Gip, first available  2. No ILD  3. Does have many new lung nodules. LArgest 6mm RUL - too small to biopsy or know what it is. PLAN: repeat ct chest wo contrast in 3 months   Ct Chest High Resolution  Result Date: 10/04/2017 CLINICAL DATA:  Chronic shortness of breath, insidious in onset and slowly worsening. Mild blood eosinophilia. Abnormal pulmonary function tests. EXAM: CT CHEST WITHOUT CONTRAST TECHNIQUE: Multidetector CT imaging of the chest was performed following the standard protocol without intravenous contrast. High resolution imaging of the lungs, as well as inspiratory and expiratory imaging, was performed. COMPARISON:  02/08/2005. FINDINGS: Cardiovascular: Mild coronary artery calcification. Heart size normal. No pericardial effusion. Mediastinum/Nodes: Mediastinal lymph nodes are not enlarged by CT size criteria. Hilar regions are difficult to evaluate without IV contrast but appear grossly unremarkable. No axillary adenopathy. Surgical clips in the right axilla esophagus is grossly unremarkable. Lungs/Pleura: Negative for subpleural reticulation, traction bronchiectasis/bronchiolectasis, ground-glass, architectural distortion or honeycombing. Multiple scattered pulmonary nodules measure up to 9 mm (8 x 9 mm) in the apical segment right upper lobe (series 3, image 29), new from 02/08/2005. Subpleural radiation fibrosis in the anterior right lung. Probable mucoid impaction and postinfectious peribronchovascular nodularity in the medial left lower lobe (series 3, image 84). No pleural fluid. Airway is unremarkable. Mild air trapping. Inspiratory prone imaging is unremarkable. Upper Abdomen: Visualized portions of the liver, gallbladder, adrenal glands, kidneys, spleen, pancreas, stomach and bowel are unremarkable with exception of a small  hiatal hernia. No upper abdominal adenopathy. Musculoskeletal: Degenerative changes in the spine. No worrisome lytic or sclerotic lesions. IMPRESSION: 1. No evidence of interstitial lung disease. No findings to explain the patient's symptoms. 2. Multiple new bilateral pulmonary nodules, measuring up to 9 mm in the apical segment right upper lobe. Non-contrast chest CT at 3-6 months is recommended. If the nodules are stable at time of repeat CT, then future CT at 18-24 months (from today's scan) is considered optional for low-risk patients, but is recommended for high-risk patients. This recommendation follows the consensus statement: Guidelines for Management of Incidental Pulmonary Nodules Detected on CT Images: From the Fleischner Society 2017; Radiology 2017; 284:228-243. 3. Probable mucoid impaction and postinfectious peribronchovascular nodularity in the medial left lower lobe. Recommend attention on follow-up. Electronically Signed   By: Lorin Picket M.D.   On: 10/04/2017 09:41

## 2017-10-12 NOTE — Telephone Encounter (Signed)
Called and spoke with pt letting her know the results of the CT scan and that we were going to refer to cardiology and also have CT scan repeated in 3 months.  Pt expressed understanding. Referral placed to cardiology and order placed for CT scan to be repeated in 3 months.  Nothing further needed at this time.

## 2017-11-29 ENCOUNTER — Telehealth: Payer: Self-pay | Admitting: Internal Medicine

## 2017-11-29 ENCOUNTER — Ambulatory Visit: Payer: Medicare Other | Admitting: Cardiology

## 2017-11-29 MED ORDER — PREDNISONE 10 MG PO TABS: ORAL_TABLET | ORAL | 0 refills | Status: DC

## 2017-11-29 MED ORDER — CEPHALEXIN 500 MG PO CAPS: 500.0000 mg | ORAL_CAPSULE | Freq: Three times a day (TID) | ORAL | 0 refills | Status: DC

## 2017-11-29 NOTE — Telephone Encounter (Signed)
Sounds like early stages of ase-asthma  Plan  - Please take prednisone 40 mg x1 day, then 30 mg x1 day, then 20 mg x1 day, then 10 mg x1 day, and then 5 mg x1 day and stop - continue breo  - and if fever or drainage changes clear start cephalexin 500mg  three times daily x  5 days (but you can send it)  - also she is supposed to have 3 month CT fo rnodules in end of June 2019 - please ensure   Allergies  Allergen Reactions  . Hydrocodone Itching    Face swelled up

## 2017-11-29 NOTE — Telephone Encounter (Signed)
Pt is aware of below message and voiced her understanding. Rx for prednisone and Cephalexin has been sent to preferred pharmacy. Nothing further is needed.

## 2017-11-29 NOTE — Telephone Encounter (Signed)
Called and spoke to pt.  Pt states she recently traveled out of the country and "catch the travel crud". Pt reports of non prod cough & nasal drainage clear in color x2d. Pt feels that she may have had a fever yesterday but did not measure her temp.  preferred pharmacy is [facility].   MR please advise. Thanks

## 2017-12-12 NOTE — Progress Notes (Addendum)
Subjective:   Jasmine Neal is a 72 y.o. female who presents for Medicare Annual (Subsequent) preventive examination.  Reports health as good Right breast cancer 1990 Labs chol/hdl 3; hdl 52; LDL 99; trig 81 (hx of mother osteoporosis; breast cancer) Sister breast cancer Father MG House is getting to be to much  Thinking of move in the future     Diet BMI 23 Chol/hdl 3; chol 160; hdl 52; trig 81 Adequately healthy Eats lots of vegetables Loves beans and fiber;    Exercise Spouse expired x 2 yo Walks q am x 7 days a week 20 minutes Sometimes will go to the park Was going to [employer]  Was hurt after using the elliptical and rower Walks at hs ; stretches some at hs Married dtr in [place] - No grand children   There are no preventive care reminders to display for this patient.  Colonoscopy 05/2013; due 2024  Liver scores that were elevated;  Mammogram 08/2016; DUE dexa 02/2017  -1.3  Educated regarding the shingrix   Now has asthma instead of COPD   Recent illness from trip to [place]   Stress seems manageable  Refugee volunteer    Cardiac Risk Factors include: advanced age (>27men, >40 women);family history of premature cardiovascular disease    Objective:     Vitals: BP 112/72   Pulse 70   Ht 5\' 9"  (1.753 m)   Wt 160 lb 3 oz (72.7 kg)   SpO2 95%   BMI 23.66 kg/m   Body mass index is 23.66 kg/m.  Advanced Directives 12/13/2017  Does Patient Have a Medical Advance Directive? Yes    Tobacco Social History   Tobacco Use  Smoking Status Former Smoker  . Packs/day: 0.80  . Years: 10.00  . Pack years: 8.00  . Types: Cigarettes  . Last attempt to quit: 07/11/1974  . Years since quitting: 43.4  Smokeless Tobacco Never Used     Counseling given: Yes   Clinical Intake:     Past Medical History:  Diagnosis Date  . Asthma    evalutated by pulmonary severe by spirometry no smoker currently  . Breast cancer ([place])    hx of right  lumpectomy, 1990 IIA radiation cmfp ajunct rx  . Chronic airway obstruction, not elsewhere classified   . Dysplasia    hx treated with cerv conization  . Fatty liver    US done 2010  . Fibromyalgia   . Hx of nonmelanoma skin cancer     followed dr Martinique   . Hyperlipidemia   . Hypothyroidism   . Osteoarthritis    Past Surgical History:  Procedure Laterality Date  . BREAST LUMPECTOMY     on right, 1990 IIA radiation cmfp ajuction rx  . KNEE SURGERY    . NASAL SINUS SURGERY     on right done by Dr. Wilburn Cornelia   Family History  Problem Relation Age of Onset  . Other Father        mysthenia gravis  . Lung cancer Sister   . Osteoporosis Unknown   . Thyroid disease Unknown   . Breast cancer Mother 46       possible uterine cancer diagnosis at age 68  . Osteoporosis Mother        no hip fracture  . Breast cancer Sister 61  . Lung cancer Sister 80       former smoker  . Lung cancer Paternal Grandmother    Social History  Socioeconomic History  . Marital status: Widowed    Spouse name: Not on file  . Number of children: Not on file  . Years of education: Not on file  . Highest education level: Not on file  Occupational History  . Occupation: Retired Tour manager  . Financial resource strain: Not on file  . Food insecurity:    Worry: Not on file    Inability: Not on file  . Transportation needs:    Medical: Not on file    Non-medical: Not on file  Tobacco Use  . Smoking status: Former Smoker    Packs/day: 0.80    Years: 10.00    Pack years: 8.00    Types: Cigarettes    Last attempt to quit: 07/11/1974    Years since quitting: 43.4  . Smokeless tobacco: Never Used  Substance and Sexual Activity  . Alcohol use: Yes    Alcohol/week: 3.5 oz    Types: 7 Standard drinks or equivalent per week    Comment: 1 glass of red wine nightly and socially  . Drug use: No  . Sexual activity: Yes    Partners: Male  Lifestyle  . Physical activity:    Days per week:  Not on file    Minutes per session: Not on file  . Stress: Not on file  Relationships  . Social connections:    Talks on phone: Not on file    Gets together: Not on file    Attends religious service: Not on file    Active member of club or organization: Not on file    Attends meetings of clubs or organizations: Not on file    Relationship status: Not on file  Other Topics Concern  . Not on file  Social History Narrative   Retired Pharmacist, hospital    hh of 1   Now has puppy    Quit smoking 30 + years ago. 20 year pack year smoking hx   Regular exercise- some  treadmill    8 hours sleep         Caffeine use: 1-2 cups of coffee/[employer] instant   Husband dx glioblastoma fall 15   Passed away fall 10/20/14    Outpatient Encounter Medications as of 12/13/2017  Medication Sig  . acetaminophen (TYLENOL) 500 MG tablet Take 1,000 mg by mouth every 6 (six) hours as needed. pain   . cholecalciferol (VITAMIN D) 1000 UNITS tablet Take 1,000 Units by mouth daily.  . cyanocobalamin (TH VITAMIN B12) 100 MCG tablet Take by mouth.  . fluticasone furoate-vilanterol (BREO ELLIPTA) 100-25 MCG/INH AEPB Inhale 1 puff into the lungs daily.  Marland Kitchen PARoxetine (PAXIL) 40 MG tablet TAKE 1 TABLET BY MOUTH AT  BEDTIME  . SYNTHROID 88 MCG tablet TAKE 1 TABLET BY MOUTH  DAILY  . valACYclovir (VALTREX) 1000 MG tablet TAKE 2 TABLETS BY MOUTH TWICE DAILY AS DIRECTED  . diphenhydramine-acetaminophen (TYLENOL PM) 25-500 MG TABS Take 1 tablet by mouth at bedtime as needed. sleep  . predniSONE (DELTASONE) 10 MG tablet 4 tabs x 1 day, 3 tabs x 1 day, 2 tab x 1 day, 1 tab x 1 day, 0.5 tab x 1 day then stop (Patient not taking: Reported on 12/13/2017)  . [DISCONTINUED] cephALEXin (KEFLEX) 500 MG capsule Take 1 capsule (500 mg total) by mouth 3 (three) times daily.   No facility-administered encounter medications on file as of 12/13/2017.     Activities of Daily Living In your present state of health, do  you have any difficulty performing  the following activities: 12/13/2017  Hearing? N  Vision? N  Difficulty concentrating or making decisions? N  Walking or climbing stairs? N  Dressing or bathing? N  Preparing Food and eating ? N  Using the Toilet? N  In the past six months, have you accidently leaked urine? N  Do you have problems with loss of bowel control? N  Managing your Medications? N  Managing your Finances? N  Housekeeping or managing your Housekeeping? N  Some recent data might be hidden    Patient Care Team: Panosh, Standley Brooking, MD as PCP - General Jerrell Belfast, MD Martinique, Amy, MD as Consulting Physician (Dermatology) Neldon Mc, Donnamarie Poag, MD as Consulting Physician (Allergy and Immunology) Marygrace Drought, MD as Consulting Physician (Ophthalmology) Clent Jacks, MD as Consulting Physician (Ophthalmology) Neldon Mc Donnamarie Poag, MD as Consulting Physician (Allergy and Immunology)    Assessment:   This is a routine wellness examination for Jasmine Neal.  Exercise Activities and Dietary recommendations Current Exercise Habits: Home exercise routine, Type of exercise: walking, Time (Minutes): 30, Frequency (Times/Week): 5(walks dog ), Weekly Exercise (Minutes/Week): 150  Goals    . Exercise 150 min/wk Moderate Activity     Will try to go to [employer] now       Fall Risk Fall Risk  12/13/2017 02/27/2017 02/16/2016 10/10/2014 10/09/2013  Falls in the past year? Yes No No No No  Comment walking through hotel in [place]  - - - -  Number falls in past yr: 1 - - - -  Injury with Fall? Yes - - - -  Follow up Education provided - - - -     Depression Screen PHQ 2/9 Scores 12/13/2017 02/27/2017 02/16/2016 10/10/2014  PHQ - 2 Score 0 0 0 0     Cognitive Function   Ad8 score reviewed for issues:  Issues making decisions:  Less interest in hobbies / activities:  Repeats questions, stories (family complaining):  Trouble using ordinary gadgets (microwave, computer, phone):  Forgets the month or year:   Mismanaging finances:     Remembering appts:  Daily problems with thinking and/or memory: Ad8 score is=0         Immunization History  Administered Date(s) Administered  . Influenza Whole 04/10/2002, 06/01/2009, 05/05/2010, 05/22/2012, 02/20/2017  . Influenza, High Dose Seasonal PF 04/22/2016  . Influenza,inj,Quad PF,6+ Mos 06/04/2013  . Influenza-Unspecified 05/23/2011  . Pneumococcal Conjugate-13 10/09/2013  . Pneumococcal Polysaccharide-23 05/05/2010  . Td 08/11/2006, 02/27/2017  . Zoster 09/11/2012      Screening Tests Health Maintenance  Topic Date Due  . INFLUENZA VACCINE  02/08/2018  . MAMMOGRAM  08/23/2018  . COLONOSCOPY  05/14/2023  . TETANUS/TDAP  02/28/2027  . DEXA SCAN  Completed  . Hepatitis C Screening  Completed  . PNA vac Low Risk Adult  Completed          Plan:      PCP Notes   Health Maintenance Educated regarding the tetanus with pertussis; Just had regular td last year  Educated regarding shingrix   Given information on having a hearing screen if she would like to have her hearing tested   Abnormal Screens  BP WNL; had cuff at home and not sure how to use it Educated on how to take a BP; demonstration and let her check a BP .   Referrals  none  Patient concerns; Educated regarding pelvic vs pap test at her age per the guidelines  Nurse Concerns; Going to see  cardiologist; referred by pulmonologist  Just to be sure recent dyspnea is not related to CAD    Next PCP apt 03/06/2018;  Will schedule her wellness next year with her visit with Dr. Regis Bill       I have personally reviewed and noted the following in the patient's chart:   . Medical and social history . Use of alcohol, tobacco or illicit drugs  . Current medications and supplements . Functional ability and status . Nutritional status . Physical activity . Advanced directives . List of other physicians . Hospitalizations, surgeries, and ER visits in previous 12  months . Vitals . Screenings to include cognitive, depression, and falls . Referrals and appointments  In addition, I have reviewed and discussed with patient certain preventive protocols, quality metrics, and best practice recommendations. A written personalized care plan for preventive services as well as general preventive health recommendations were provided to patient.     ZOXWR,UEAVW, RN  12/13/2017  Results of annual Medicare wellness visit reviewed and agree with findings  Nyoka Cowden

## 2017-12-13 ENCOUNTER — Ambulatory Visit (INDEPENDENT_AMBULATORY_CARE_PROVIDER_SITE_OTHER): Payer: Medicare Other

## 2017-12-13 VITALS — BP 112/72 | HR 70 | Ht 69.0 in | Wt 160.2 lb

## 2017-12-13 DIAGNOSIS — Z0000 Encounter for general adult medical examination without abnormal findings: Secondary | ICD-10-CM | POA: Diagnosis not present

## 2017-12-13 NOTE — Patient Instructions (Addendum)
Jasmine Neal , Thank you for taking time to come for your Medicare Wellness Visit. I appreciate your ongoing commitment to your health goals. Please review the following plan we discussed and let me know if I can assist you in the future.   A Tetanus is recommended every 10 years. Medicare covers a tetanus if you have a cut or wound; otherwise, there may be a charge. If you had not had a tetanus with pertusses, known as the Tdap, you can take this anytime.   Shingrix is a vaccine for the prevention of Shingles in Adults 50 and older.  If you are on Medicare, the shingrix is covered under your Part D plan, so you will take both of the vaccines in the series at your pharmacy. Please check with your benefits regarding applicable copays or out of pocket expenses.  The Shingrix is given in 2 vaccines approx 8 weeks apart. You must receive the 2nd dose prior to 6 months from receipt of the first. Please have the pharmacist print out you Immunization  dates for our office records   You can have a hearing screen at anytime  [facility] [place] - can assist with hearing aid x 1  No reviews  [facility]  [phone]  [phone]  [email or web address]    These are the goals we discussed: Goals    . Exercise 150 min/wk Moderate Activity     Will try to go to [facility] now       This is a list of the screening recommended for you and due dates:  Health Maintenance  Topic Date Due  . Flu Shot  02/08/2018  . Mammogram  08/23/2018  . Colon Cancer Screening  05/14/2023  . Tetanus Vaccine  02/28/2027  . DEXA scan (bone density measurement)  Completed  .  Hepatitis C: One time screening is recommended by Center for Disease Control  (CDC) for  adults born from [birth year] through 1965.   Completed  . Pneumonia vaccines  Completed    Prevention of falls: Remove  rugs or any tripping hazards in the home Use Non slip mats in bathtubs and showers Placing grab bars next to the toilet and or shower  Placing handrails on both sides of the stair way Adding extra lighting in the home.   Personal safety issues reviewed:  1. Consider starting a community watch program per [facility] 2.  Changes batteries is smoke detector and/or carbon monoxide detector  3.  If you have firearms; keep them in a safe place 4.  Wear protection when in the sun; Always wear sunscreen or a hat; It is good to have your doctor check your skin annually or review any new areas of concern 5. Driving safety; Keep in the right lane; stay 3 car lengths behind the car in front of you on the highway; look 3 times prior to pulling out; carry your cell phone everywhere you go!     Fall Prevention in the Home Falls can cause injuries. They can happen to people of all ages. There are many things you can do to make your home safe and to help prevent falls. What can I do on the outside of my home?  Regularly fix the edges of walkways and driveways and fix any cracks.  Remove anything that might make you trip as you walk through a door, such as a raised step or threshold.  Trim any bushes  or trees on the path to your home.  Use bright outdoor lighting.  Clear any walking paths of anything that might make someone trip, such as rocks or tools.  Regularly check to see if handrails are loose or broken. Make sure that both sides of any steps have handrails.  Any raised decks and porches should have guardrails on the edges.  Have any leaves, snow, or ice cleared regularly.  Use sand or salt on walking paths during winter.  Clean up any spills in your garage right away. This includes oil or grease spills. What can I do in the bathroom?  Use night lights.  Install grab bars by the toilet and in the tub and shower. Do not use towel bars as grab bars.  Use non-skid mats or  decals in the tub or shower.  If you need to sit down in the shower, use a plastic, non-slip stool.  Keep the floor dry. Clean up any water that spills on the floor as soon as it happens.  Remove soap buildup in the tub or shower regularly.  Attach bath mats securely with double-sided non-slip rug tape.  Do not have throw rugs and other things on the floor that can make you trip. What can I do in the bedroom?  Use night lights.  Make sure that you have a light by your bed that is easy to reach.  Do not use any sheets or blankets that are too big for your bed. They should not hang down onto the floor.  Have a firm chair that has side arms. You can use this for support while you get dressed.  Do not have throw rugs and other things on the floor that can make you trip. What can I do in the kitchen?  Clean up any spills right away.  Avoid walking on wet floors.  Keep items that you use a lot in easy-to-reach places.  If you need to reach something above you, use a strong step stool that has a grab bar.  Keep electrical cords out of the way.  Do not use floor polish or wax that makes floors slippery. If you must use wax, use non-skid floor wax.  Do not have throw rugs and other things on the floor that can make you trip. What can I do with my stairs?  Do not leave any items on the stairs.  Make sure that there are handrails on both sides of the stairs and use them. Fix handrails that are broken or loose. Make sure that handrails are as long as the stairways.  Check any carpeting to make sure that it is firmly attached to the stairs. Fix any carpet that is loose or worn.  Avoid having throw rugs at the top or bottom of the stairs. If you do have throw rugs, attach them to the floor with carpet tape.  Make sure that you have a light switch at the top of the stairs and the bottom of the stairs. If you do not have them, ask someone to add them for you. What else can I do to  help prevent falls?  Wear shoes that: ? Do not have high heels. ? Have rubber bottoms. ? Are comfortable and fit you well. ? Are closed at the toe. Do not wear sandals.  If you use a stepladder: ? Make sure that it is fully opened. Do not climb a closed stepladder. ? Make sure that both sides of the stepladder are locked  into place. ? Ask someone to hold it for you, if possible.  Clearly mark and make sure that you can see: ? Any grab bars or handrails. ? First and last steps. ? Where the edge of each step is.  Use tools that help you move around (mobility aids) if they are needed. These include: ? Canes. ? Walkers. ? Scooters. ? Crutches.  Turn on the lights when you go into a dark area. Replace any light bulbs as soon as they burn out.  Set up your furniture so you have a clear path. Avoid moving your furniture around.  If any of your floors are uneven, fix them.  If there are any pets around you, be aware of where they are.  Review your medicines with your doctor. Some medicines can make you feel dizzy. This can increase your chance of falling. Ask your doctor what other things that you can do to help prevent falls. This information is not intended to replace advice given to you by your health care provider. Make sure you discuss any questions you have with your health care provider. Document Released: 04/23/2009 Document Revised: 12/03/2015 Document Reviewed: 08/01/2014 Elsevier Interactive Patient Education  2018 [place] Maintenance, Female Adopting a healthy lifestyle and getting preventive care can go a long way to promote health and wellness. Talk with your health care provider about what schedule of regular examinations is right for you. This is a good chance for you to check in with your provider about disease prevention and staying healthy. In between checkups, there are plenty of things you can do on your own. Experts have done a lot of research  about which lifestyle changes and preventive measures are most likely to keep you healthy. Ask your health care provider for more information. Weight and diet Eat a healthy diet  Be sure to include plenty of vegetables, fruits, low-fat dairy products, and lean protein.  Do not eat a lot of foods high in solid fats, added sugars, or salt.  Get regular exercise. This is one of the most important things you can do for your health. ? Most adults should exercise for at least 150 minutes each week. The exercise should increase your heart rate and make you sweat (moderate-intensity exercise). ? Most adults should also do strengthening exercises at least twice a week. This is in addition to the moderate-intensity exercise.  Maintain a healthy weight  Body mass index (BMI) is a measurement that can be used to identify possible weight problems. It estimates body fat based on height and weight. Your health care provider can help determine your BMI and help you achieve or maintain a healthy weight.  For females 64 years of age and older: ? A BMI below 18.5 is considered underweight. ? A BMI of 18.5 to 24.9 is normal. ? A BMI of 25 to 29.9 is considered overweight. ? A BMI of 30 and above is considered obese.  Watch levels of cholesterol and blood lipids  You should start having your blood tested for lipids and cholesterol at 73 years of age, then have this test every 5 years.  You may need to have your cholesterol levels checked more often if: ? Your lipid or cholesterol levels are high. ? You are older than 73 years of age. ? You are at high risk for heart disease.  Cancer screening Lung Cancer  Lung cancer screening is recommended for adults 53-64 years old who are at high risk for lung  cancer because of a history of smoking.  A yearly low-dose CT scan of the lungs is recommended for people who: ? Currently smoke. ? Have quit within the past 15 years. ? Have at least a 30-pack-year  history of smoking. A pack year is smoking an average of one pack of cigarettes a day for 1 year.  Yearly screening should continue until it has been 15 years since you quit.  Yearly screening should stop if you develop a health problem that would prevent you from having lung cancer treatment.  Breast Cancer  Practice breast self-awareness. This means understanding how your breasts normally appear and feel.  It also means doing regular breast self-exams. Let your health care provider know about any changes, no matter how small.  If you are in your 20s or 30s, you should have a clinical breast exam (CBE) by a health care provider every 1-3 years as part of a regular health exam.  If you are 68 or older, have a CBE every year. Also consider having a breast X-ray (mammogram) every year.  If you have a family history of breast cancer, talk to your health care provider about genetic screening.  If you are at high risk for breast cancer, talk to your health care provider about having an MRI and a mammogram every year.  Breast cancer gene (BRCA) assessment is recommended for women who have family members with BRCA-related cancers. BRCA-related cancers include: ? Breast. ? Ovarian. ? Tubal. ? Peritoneal cancers.  Results of the assessment will determine the need for genetic counseling and BRCA1 and BRCA2 testing.  Cervical Cancer Your health care provider may recommend that you be screened regularly for cancer of the pelvic organs (ovaries, uterus, and vagina). This screening involves a pelvic examination, including checking for microscopic changes to the surface of your cervix (Pap test). You may be encouraged to have this screening done every 3 years, beginning at age 70.  For women ages 1-65, health care providers may recommend pelvic exams and Pap testing every 3 years, or they may recommend the Pap and pelvic exam, combined with testing for human papilloma virus (HPV), every 5 years.  Some types of HPV increase your risk of cervical cancer. Testing for HPV may also be done on women of any age with unclear Pap test results.  Other health care providers may not recommend any screening for nonpregnant women who are considered low risk for pelvic cancer and who do not have symptoms. Ask your health care provider if a screening pelvic exam is right for you.  If you have had past treatment for cervical cancer or a condition that could lead to cancer, you need Pap tests and screening for cancer for at least 20 years after your treatment. If Pap tests have been discontinued, your risk factors (such as having a new sexual partner) need to be reassessed to determine if screening should resume. Some women have medical problems that increase the chance of getting cervical cancer. In these cases, your health care provider may recommend more frequent screening and Pap tests.  Colorectal Cancer  This type of cancer can be detected and often prevented.  Routine colorectal cancer screening usually begins at 73 years of age and continues through 73 years of age.  Your health care provider may recommend screening at an earlier age if you have risk factors for colon cancer.  Your health care provider may also recommend using home test kits to check for hidden blood in the  stool.  A small camera at the end of a tube can be used to examine your colon directly (sigmoidoscopy or colonoscopy). This is done to check for the earliest forms of colorectal cancer.  Routine screening usually begins at age 56.  Direct examination of the colon should be repeated every 5-10 years through 73 years of age. However, you may need to be screened more often if early forms of precancerous polyps or small growths are found.  Skin Cancer  Check your skin from head to toe regularly.  Tell your health care provider about any new moles or changes in moles, especially if there is a change in a mole's shape or  color.  Also tell your health care provider if you have a mole that is larger than the size of a pencil eraser.  Always use sunscreen. Apply sunscreen liberally and repeatedly throughout the day.  Protect yourself by wearing long sleeves, pants, a wide-brimmed hat, and sunglasses whenever you are outside.  Heart disease, diabetes, and high blood pressure  High blood pressure causes heart disease and increases the risk of stroke. High blood pressure is more likely to develop in: ? People who have blood pressure in the high end of the normal range (130-139/85-89 mm Hg). ? People who are overweight or obese. ? People who are African American.  If you are 70-32 years of age, have your blood pressure checked every 3-5 years. If you are 23 years of age or older, have your blood pressure checked every year. You should have your blood pressure measured twice-once when you are at a hospital or clinic, and once when you are not at a hospital or clinic. Record the average of the two measurements. To check your blood pressure when you are not at a hospital or clinic, you can use: ? An automated blood pressure machine at a pharmacy. ? A home blood pressure monitor.  If you are between 78 years and 38 years old, ask your health care provider if you should take aspirin to prevent strokes.  Have regular diabetes screenings. This involves taking a blood sample to check your fasting blood sugar level. ? If you are at a normal weight and have a low risk for diabetes, have this test once every three years after 73 years of age. ? If you are overweight and have a high risk for diabetes, consider being tested at a younger age or more often. Preventing infection Hepatitis B  If you have a higher risk for hepatitis B, you should be screened for this virus. You are considered at high risk for hepatitis B if: ? You were born in a country where hepatitis B is common. Ask your health care provider which countries  are considered high risk. ? Your parents were born in a high-risk country, and you have not been immunized against hepatitis B (hepatitis B vaccine). ? You have HIV or AIDS. ? You use needles to inject street drugs. ? You live with someone who has hepatitis B. ? You have had sex with someone who has hepatitis B. ? You get hemodialysis treatment. ? You take certain medicines for conditions, including cancer, organ transplantation, and autoimmune conditions.  Hepatitis C  Blood testing is recommended for: ? Everyone born from [birth year] through 1966. ? Anyone with known risk factors for hepatitis C.  Sexually transmitted infections (STIs)  You should be screened for sexually transmitted infections (STIs) including gonorrhea and chlamydia if: ? You are sexually active and are younger  than 73 years of age. ? You are older than 73 years of age and your health care provider tells you that you are at risk for this type of infection. ? Your sexual activity has changed since you were last screened and you are at an increased risk for chlamydia or gonorrhea. Ask your health care provider if you are at risk.  If you do not have HIV, but are at risk, it may be recommended that you take a prescription medicine daily to prevent HIV infection. This is called pre-exposure prophylaxis (PrEP). You are considered at risk if: ? You are sexually active and do not regularly use condoms or know the HIV status of your partner(s). ? You take drugs by injection. ? You are sexually active with a partner who has HIV.  Talk with your health care provider about whether you are at high risk of being infected with HIV. If you choose to begin PrEP, you should first be tested for HIV. You should then be tested every 3 months for as long as you are taking PrEP. Pregnancy  If you are premenopausal and you may become pregnant, ask your health care provider about preconception counseling.  If you may become pregnant, take 400  to 800 micrograms (mcg) of folic acid every day.  If you want to prevent pregnancy, talk to your health care provider about birth control (contraception). Osteoporosis and menopause  Osteoporosis is a disease in which the bones lose minerals and strength with aging. This can result in serious bone fractures. Your risk for osteoporosis can be identified using a bone density scan.  If you are 71 years of age or older, or if you are at risk for osteoporosis and fractures, ask your health care provider if you should be screened.  Ask your health care provider whether you should take a calcium or vitamin D supplement to lower your risk for osteoporosis.  Menopause may have certain physical symptoms and risks.  Hormone replacement therapy may reduce some of these symptoms and risks. Talk to your health care provider about whether hormone replacement therapy is right for you. Follow these instructions at home:  Schedule regular health, dental, and eye exams.  Stay current with your immunizations.  Do not use any tobacco products including cigarettes, chewing tobacco, or electronic cigarettes.  If you are pregnant, do not drink alcohol.  If you are breastfeeding, limit how much and how often you drink alcohol.  Limit alcohol intake to no more than 1 drink per day for nonpregnant women. One drink equals 12 ounces of beer, 5 ounces of wine, or 1 ounces of hard liquor.  Do not use street drugs.  Do not share needles.  Ask your health care provider for help if you need support or information about quitting drugs.  Tell your health care provider if you often feel depressed.  Tell your health care provider if you have ever been abused or do not feel safe at home. This information is not intended to replace advice given to you by your health care provider. Make sure you discuss any questions you have with your health care provider. Document Released: 01/10/2011 Document Revised: 12/03/2015  Document Reviewed: 03/31/2015 Elsevier Interactive Patient Education  [employer].

## 2018-01-15 ENCOUNTER — Inpatient Hospital Stay: Admission: RE | Admit: 2018-01-15 | Payer: Medicare Other | Source: Ambulatory Visit

## 2018-01-23 ENCOUNTER — Encounter

## 2018-01-23 ENCOUNTER — Ambulatory Visit: Payer: Medicare Other | Admitting: Cardiology

## 2018-01-23 ENCOUNTER — Encounter (INDEPENDENT_AMBULATORY_CARE_PROVIDER_SITE_OTHER): Payer: Self-pay

## 2018-01-23 ENCOUNTER — Ambulatory Visit (INDEPENDENT_AMBULATORY_CARE_PROVIDER_SITE_OTHER)
Admission: RE | Admit: 2018-01-23 | Discharge: 2018-01-23 | Disposition: A | Discharge status: Home / Self Care | Payer: Medicare Other | Source: Ambulatory Visit | Attending: Internal Medicine | Admitting: Internal Medicine

## 2018-01-23 ENCOUNTER — Encounter: Payer: Self-pay | Admitting: Cardiology

## 2018-01-23 VITALS — BP 132/79 | HR 75 | Ht 69.0 in | Wt 157.1 lb

## 2018-01-23 DIAGNOSIS — R918 Other nonspecific abnormal finding of lung field: Secondary | ICD-10-CM | POA: Diagnosis not present

## 2018-01-23 DIAGNOSIS — R0602 Shortness of breath: Secondary | ICD-10-CM | POA: Diagnosis not present

## 2018-01-23 DIAGNOSIS — E7800 Pure hypercholesterolemia, unspecified: Secondary | ICD-10-CM

## 2018-01-23 DIAGNOSIS — I2510 Atherosclerotic heart disease of native coronary artery without angina pectoris: Secondary | ICD-10-CM | POA: Diagnosis not present

## 2018-01-23 HISTORY — DX: Atherosclerotic heart disease of native coronary artery without angina pectoris: I25.10

## 2018-01-23 NOTE — Patient Instructions (Signed)
Medication Instructions:  Your physician recommends that you continue on your current medications as directed. Please refer to the Current Medication list given to you today.  Labwork: NONE  Testing/Procedures: Your physician has requested that you have an echocardiogram. Echocardiography is a painless test that uses sound waves to create images of your heart. It provides your doctor with information about the size and shape of your heart and how well your heart's chambers and valves are working. This procedure takes approximately one hour. There are no restrictions for this procedure.  Your physician has requested that you have en exercise stress myoview. For further information please visit [email or web address]. Please follow instruction sheet, as given.  Follow-Up: Your physician wants you to follow-up as needed with Dr. Radford Pax.   If you need a refill on your cardiac medications before your next appointment, please call your pharmacy.

## 2018-01-23 NOTE — Progress Notes (Signed)
Cardiology Office Note    Date:  01/23/2018   ID:  Jasmine Neal, DOB Jan 08, 1946, MRN [id number]  PCP:  Burnis Medin, MD  Cardiologist:  Fransico Him, MD   Chief Complaint  Patient presents with  . New Patient (Initial Visit)    Jasmine Neal artery calcifications, shortness of breath    History of Present Illness:  Jasmine Neal is a 72 y.o. female who is being seen today for the evaluation of her coronaryartery calcifications on CT and SOB at the request of Panosh, Standley Brooking, MD.  Is a very pleasant 72 year old female with a history of asthma (followed by Dr. Chase Caller), hyperlipidemia and remote tobacco use who had a chest CT in March for evaluation of shortness of breath and was noted to have mild coronary artery calcifications.  Saw Dr. Chase Caller who felt that she had asthma/exercise-induced asthma and was placed on an inhaler.  She is concerned though that she continues to have dyspnea on exertion when going up hills or stairs and because of her cardiac risk factors she is referred for further cardiac evaluation.  She is doing well.  She denies any chest pain or pressure,  PND, orthopnea, LE edema, dizziness, palpitations or syncope. She is compliant with her meds and is tolerating meds with no SE. she has a history of tobacco use but quit 45 years ago.  She has no family history of CAD.  Past Medical History:  Diagnosis Date  . Asthma    evalutated by pulmonary severe by spirometry no smoker currently  . Breast cancer ([place])    hx of right lumpectomy, 1990 IIA radiation cmfp ajunct rx  . Chronic airway obstruction, not elsewhere classified   . Coronary artery calcification seen on CAT scan 01/23/2018  . Dysplasia    hx treated with cerv conization  . Fatty liver    US done 2010  . Fibromyalgia   . Hx of nonmelanoma skin cancer     followed dr [place]   . Hyperlipidemia   . Hypothyroidism   . Osteoarthritis     Past Surgical History:  Procedure Laterality Date  . BREAST  LUMPECTOMY     on right, 1990 IIA radiation cmfp ajuction rx  . KNEE SURGERY    . NASAL SINUS SURGERY     on right done by Dr. Wilburn Cornelia    Current Medications: Current Meds  Medication Sig  . acetaminophen (TYLENOL) 500 MG tablet Take 1,000 mg by mouth every 6 (six) hours as needed. pain   . cholecalciferol (VITAMIN D) 1000 UNITS tablet Take 1,000 Units by mouth daily.  . cyanocobalamin (TH VITAMIN B12) 100 MCG tablet Take by mouth.  . diphenhydramine-acetaminophen (TYLENOL PM) 25-500 MG TABS Take 1 tablet by mouth at bedtime as needed. sleep  . fluticasone furoate-vilanterol (BREO ELLIPTA) 100-25 MCG/INH AEPB Inhale 1 puff into the lungs daily.  Marland Kitchen PARoxetine (PAXIL) 40 MG tablet TAKE 1 TABLET BY MOUTH AT  BEDTIME  . SYNTHROID 88 MCG tablet TAKE 1 TABLET BY MOUTH  DAILY  . valACYclovir (VALTREX) 1000 MG tablet TAKE 2 TABLETS BY MOUTH TWICE DAILY AS DIRECTED    Allergies:   Hydrocodone and Hydrocodone-acetaminophen   Social History   Socioeconomic History  . Marital status: Widowed    Spouse name: Not on file  . Number of children: Not on file  . Years of education: Not on file  . Highest education level: Not on file  Occupational History  . Occupation: Retired Pharmacist, hospital  Social Needs  . Financial resource strain: Not on file  . Food insecurity:    Worry: Not on file    Inability: Not on file  . Transportation needs:    Medical: Not on file    Non-medical: Not on file  Tobacco Use  . Smoking status: Former Smoker    Packs/day: 0.80    Years: 10.00    Pack years: 8.00    Types: Cigarettes    Last attempt to quit: 07/11/1974    Years since quitting: 43.5  . Smokeless tobacco: Never Used  Substance and Sexual Activity  . Alcohol use: Yes    Alcohol/week: 4.2 oz    Types: 7 Standard drinks or equivalent per week    Comment: 1 glass of red wine nightly and socially  . Drug use: No  . Sexual activity: Yes    Partners: Male  Lifestyle  . Physical activity:    Days per  week: Not on file    Minutes per session: Not on file  . Stress: Not on file  Relationships  . Social connections:    Talks on phone: Not on file    Gets together: Not on file    Attends religious service: Not on file    Active member of club or organization: Not on file    Attends meetings of clubs or organizations: Not on file    Relationship status: Not on file  Other Topics Concern  . Not on file  Social History Narrative   Retired Pharmacist, hospital    hh of 1   Now has puppy    Quit smoking 30 + years ago. 20 year pack year smoking hx   Regular exercise- some  treadmill    8 hours sleep         Caffeine use: 1-2 cups of coffee/[employer] instant   Husband dx glioblastoma fall 15   Passed away fall Oct 30, 2014     Family History:  The patient's family history includes Breast cancer (age of onset: 43) in her sister; Breast cancer (age of onset: 61) in her mother; Lung cancer in her paternal grandmother and sister; Lung cancer (age of onset: 58) in her sister; Osteoporosis in her mother and unknown relative; Other in her father; Thyroid disease in her unknown relative.   ROS:   Please see the history of present illness.    ROS All other systems reviewed and are negative.  No flowsheet data found.     PHYSICAL EXAM:   VS:  BP 132/79 (BP Location: Right Arm, Patient Position: Sitting, Cuff Size: Normal)   Pulse 75   Ht 5\' 9"  (1.753 m)   Wt 157 lb 1.9 oz (71.3 kg)   BMI 23.20 kg/m    GEN: Well nourished, well developed, in no acute distress  HEENT: normal  Neck: no JVD, carotid bruits, or masses Cardiac: RRR; no murmurs, rubs, or gallops,no edema.  Intact distal pulses bilaterally.  Respiratory:  clear to auscultation bilaterally, normal work of breathing GI: soft, nontender, nondistended, + BS MS: no deformity or atrophy  Skin: warm and dry, no rash Neuro:  Alert and Oriented x 3, Strength and sensation are intact Psych: euthymic mood, full affect  Wt Readings from Last 3  Encounters:  01/23/18 157 lb 1.9 oz (71.3 kg)  12/13/17 160 lb 3 oz (72.7 kg)  09/22/17 159 lb (72.1 kg)      Studies/Labs Reviewed:   EKG:  EKG is ordered today.  The ekg ordered  today demonstrates normal sinus rhythm at 75 bpm with no ST changes.  There is left axis deviation.  Recent Labs: 02/27/2017: ALT 20; BUN 16; Creatinine, Ser 0.82; Potassium 4.4; Sodium 141; TSH 1.29 09/22/2017: Hemoglobin 14.2; Platelets 176.0   Lipid Panel    Component Value Date/Time   CHOL 168 02/27/2017 1057   TRIG 81.0 02/27/2017 1057   HDL 52.90 02/27/2017 1057   CHOLHDL 3 02/27/2017 1057   VLDL 16.2 02/27/2017 1057   LDLCALC 99 02/27/2017 1057   LDLDIRECT 137.0 09/11/2012 1111    Additional studies/ records that were reviewed today include:  Office notes from PCP    ASSESSMENT:    1. Coronary artery calcification seen on CAT scan   2. Pure hypercholesterolemia   3. SOB (shortness of breath)      PLAN:  In order of problems listed above:  1.  Coronary artery calcification noted on chest CT -she has been having some shortness of breath which could be related to underlying asthma but given her cardiac risk factors including a history of tobacco abuse as well as hyperlipidemia, to consider possible significant CAD.    2.  Hyperlipidemia - she is on Lipitor 40 mg daily which she will continue.  Her LDL was 78 on 01/16/2018 with a normal ALT at 23.  If calcium score is in the high risk range she needs aggressive treatment of her hyperlipidemia and would likely increase Lipitor to 80 mg daily.  3.  Shortness of breath -she has been having problems with this of breath and was diagnosed with asthma.  She has been followed by Dr. Chase Caller who placed her on inhaler.  Her shortness of breath has improved with the inhaler but she continues to have dyspnea on exertion when walking up hills or steps which she is concerned about.  Her EKG is nonischemic.  Her cardiac risk factors include hyperlipidemia,  postmenopausal state and remote history of tobacco use.  I suspect her symptoms are related to her underlying asthma but given her cardiac risk factors we will proceed with stress Myoview to rule out inducible ischemia.  I will also check a 2D echocardiogram to rule out structural heart disease.    Medication Adjustments/Labs and Tests Ordered: Current medicines are reviewed at length with the patient today.  Concerns regarding medicines are outlined above.  Medication changes, Labs and Tests ordered today are listed in the Patient Instructions below.  Patient Instructions  Medication Instructions:  Your physician recommends that you continue on your current medications as directed. Please refer to the Current Medication list given to you today.  Labwork: NONE  Testing/Procedures: Your physician has requested that you have an echocardiogram. Echocardiography is a painless test that uses sound waves to create images of your heart. It provides your doctor with information about the size and shape of your heart and how well your heart's chambers and valves are working. This procedure takes approximately one hour. There are no restrictions for this procedure.  Your physician has requested that you have en exercise stress myoview. For further information please visit [email or web address]. Please follow instruction sheet, as given.  Follow-Up: Your physician wants you to follow-up as needed with Dr. Radford Pax.   If you need a refill on your cardiac medications before your next appointment, please call your pharmacy.       Signed, Fransico Him, MD  01/23/2018 11:26 AM    [address], [place], [place]  [address] Phone: [phone])  [phone]; Fax: [phone]

## 2018-01-30 ENCOUNTER — Telehealth (HOSPITAL_COMMUNITY): Payer: Self-pay | Admitting: *Deleted

## 2018-01-30 NOTE — Telephone Encounter (Signed)
Patient given detailed instructions per Myocardial Perfusion Study Information Sheet for the test on 02/02/18. Patient notified to arrive 15 minutes early and that it is imperative to arrive on time for appointment to keep from having the test rescheduled.  If you need to cancel or reschedule your appointment, please call the office within 24 hours of your appointment. . Patient verbalized understanding.  Kirstie Peri, RN

## 2018-02-02 ENCOUNTER — Ambulatory Visit (HOSPITAL_COMMUNITY): Payer: Medicare Other | Attending: Internal Medicine

## 2018-02-02 ENCOUNTER — Ambulatory Visit (HOSPITAL_BASED_OUTPATIENT_CLINIC_OR_DEPARTMENT_OTHER): Payer: Medicare Other

## 2018-02-02 ENCOUNTER — Other Ambulatory Visit: Payer: Self-pay

## 2018-02-02 DIAGNOSIS — E785 Hyperlipidemia, unspecified: Secondary | ICD-10-CM | POA: Insufficient documentation

## 2018-02-02 DIAGNOSIS — I088 Other rheumatic multiple valve diseases: Secondary | ICD-10-CM | POA: Insufficient documentation

## 2018-02-02 DIAGNOSIS — I2510 Atherosclerotic heart disease of native coronary artery without angina pectoris: Secondary | ICD-10-CM | POA: Diagnosis not present

## 2018-02-02 DIAGNOSIS — Z87891 Personal history of nicotine dependence: Secondary | ICD-10-CM | POA: Insufficient documentation

## 2018-02-02 DIAGNOSIS — R0602 Shortness of breath: Secondary | ICD-10-CM

## 2018-02-02 LAB — MYOCARDIAL PERFUSION IMAGING
CHL CUP NUCLEAR SSS: 8
LHR: 0.34
LV dias vol: 80 mL (ref 46–106)
LV sys vol: 18 mL
Peak HR: 118 {beats}/min
Rest HR: 71 {beats}/min
SDS: 2
SRS: 6
TID: 0.92

## 2018-02-02 LAB — ECHOCARDIOGRAM COMPLETE
HEIGHTINCHES: 69 in
Weight: 2512 oz

## 2018-02-02 MED ORDER — REGADENOSON 0.4 MG/5ML IV SOLN
0.4000 mg | Freq: Once | INTRAVENOUS | Status: AC
  Administered 2018-02-02: 0.4 mg via INTRAVENOUS

## 2018-02-02 MED ORDER — TECHNETIUM TC 99M TETROFOSMIN IV KIT
30.9000 | PACK | Freq: Once | INTRAVENOUS | Status: AC | PRN
  Administered 2018-02-02: 30.9 via INTRAVENOUS
  Filled 2018-02-02: qty 31

## 2018-02-02 MED ORDER — TECHNETIUM TC 99M TETROFOSMIN IV KIT
10.3000 | PACK | Freq: Once | INTRAVENOUS | Status: AC | PRN
  Administered 2018-02-02: 10.3 via INTRAVENOUS
  Filled 2018-02-02: qty 11

## 2018-02-05 ENCOUNTER — Telehealth: Payer: Self-pay

## 2018-02-05 NOTE — Telephone Encounter (Signed)
Notes recorded by Frederik Schmidt, RN on 02/05/2018 at 9:53 AM EDT LPMTCB 7/29 ------

## 2018-02-05 NOTE — Telephone Encounter (Signed)
Notes recorded by Frederik Schmidt, RN on 02/05/2018 at 12:18 PM EDT Informed patient of results/recommendations. She verbalized understanding. ------

## 2018-02-05 NOTE — Telephone Encounter (Signed)
Notes recorded by Frederik Schmidt, RN on 02/05/2018 at 12:17 PM EDT Spoke with patient and gave her results/recommendations. She verbalized understanding. ------

## 2018-02-05 NOTE — Telephone Encounter (Signed)
-----   Message from Sueanne Margarita, MD sent at 02/02/2018 10:11 PM EDT ----- Please let patient know that stress test was fine

## 2018-02-16 ENCOUNTER — Ambulatory Visit: Payer: Medicare Other | Admitting: Internal Medicine

## 2018-02-16 ENCOUNTER — Encounter: Payer: Self-pay | Admitting: Internal Medicine

## 2018-02-16 VITALS — BP 118/70 | HR 71 | Ht 69.0 in | Wt 159.0 lb

## 2018-02-16 DIAGNOSIS — R918 Other nonspecific abnormal finding of lung field: Secondary | ICD-10-CM

## 2018-02-16 DIAGNOSIS — J4540 Moderate persistent asthma, uncomplicated: Secondary | ICD-10-CM | POA: Diagnosis not present

## 2018-02-16 LAB — POCT EXHALED NITRIC OXIDE: FeNO level (ppb): 25

## 2018-02-16 NOTE — Addendum Note (Signed)
Addended by: Vivia Ewing on: 02/16/2018 12:01 PM   Modules accepted: Orders

## 2018-02-16 NOTE — Progress Notes (Signed)
Subjective:     Patient ID: Jasmine Neal, female   DOB: [birth year], 72 y.o.   MRN: [id number]  HPI   PCP Panosh, Standley Brooking, MD   HPI  IOV 08/18/2017  Chief Complaint  Patient presents with  . pulmonary consult    SOB with activity wheezing with exercise and a cough in the morning.    72 year old female whose husband died seen in 10-17-2013 for a lung nodule and he subsequently died from glioblastoma multiform A.  She is here as a new consult.  Her issues that she has had over 10 years of shortness of breath with exertion relieved by rest.  Insidious onset.  Slowly getting worse.  She also has cough with early morning wheezing.  The cough is dry but occasionally she has congestion.  Symptoms are indeed progressive.  Several years ago she was on Symbicort or Advair.  She does not remember if this improved her.  Then a few years ago she got changed to [facility] along with Asmanex.  6 months ago the Asmanex was stopped.  Overall these changes have not helped her.  She has learned to live with this. Vacuuming can  cause problems symptoms .  There is no associated chest pain.  She does not have hypertension or diabetes.  She is worried about the risk of lung cancer.  There is because a paternal grandfather and sister died from lung cancer.  Lab review shows that she has eosinophilia that is mild at 300 cells per cubic millimeter.  She has had previous allergy testing that was positive done by Dr. Neldon Mc.  Last CT scan of the chest was in 10/17/2004.  In May 2018 she had spirometry at the outside.  I personally visualized the trace.  FEV1 0.9 L / 38% FVC 46% with an obstructed ratio.  Exam nitric oxide today is elevated at 58 ppb. IN terms of smoking history, she smoke 1 ppd x 10 years but quit when she was 27.,> reports allrgy test 10 years ago as positive only for dust mites   OV 09/22/2017  Chief Complaint  Patient presents with  . Follow-up    PFT done, no asthma flare at this time    Jasmine Neal  returns for follow-up.  She tells me that after starting Brio her symptoms are significantly improved.  In fact asthma control questionnaire shows a symptom score of 0.5.  She is now waking up at night because of asthma symptoms.  When she wakes up she has no symptoms.  She feels very slightly limited in her activities only when walking uphill.  And she is actually has a very little shortness of breath but again only while walking uphill or climbing stairs.  She has never wheezed and not use albuterol for rescue at all.  Overall is a huge improvement.   Walking desaturation test on 09/22/2017 185 feet x 3 laps on ROOM AIR:  did no desaturate. Rest pulse ox was 100%, final pulse ox was 97%. HR response 69/min at rest to 93/min at peak exertion. Patient Jasmine Neal  Did not Desaturate < 88% . Prim Jasmine Neal yes  Desaturated </= 3% points. Jasmine Neal yes did get tachyardic.  But she did drop pulse ox by 3 points.  In talking to her she tells me that she is got this fixed exertional dyspnea that is relieved by rest.  This may be slowly progressive.  There is no cough or wheezing.  She  had pulmonary function test today and this shows significant severe obstruction and on bronchodilator responsiveness flow volume loop abnormalities.  The DLCO was also reduced at 60%.  She is very surprised that her lung function abnormalities also severe because she does not feel the symptoms as much and she under perceives the symptoms relative to the lung function abnormality.  She is very keen on getting a CT scan of the chest because of family history of lung cancer.   OV 02/16/2018  Chief Complaint  Patient presents with  . Follow-up    5 month follow up to discuss test results.    Jasmine Neal eturns for follow-up of 2 issues  Moderate persistent asthma with fixed exertional dyspnea: She is tells me that with Brio her symptoms of improvement. Given the fixed exertional dyspnea is better. Overall asthma  control questionnaire shows a score of 0.4 showing excellent control. She is not waking up in the middle of the night with symptoms when she wakes up she has no symptoms. She is very slightly limited in her activities because of asthma and she'Jasmine experienced very little shortness of breath. She does not have any wheeze or does not use albuterol for rescue  Follow-up multiple lung nodules: the largest in 63mmm RUL nodule and this stable march 2019  -> July 2019    Asthma Control Panel eos 300 always - last 2018, 100 in march 2019 and 400 in aug 2017. IgE 8 - march 2019 08/18/2017  09/22/2017  02/16/2018   Current Med Regimen anoro breo breo  ACQ 5 point- 1 week. wtd avg score. <1.0 is good control 0.75-1.25 is grey zone. >1.25 poor control. Delta 0.5 is clinically meaningful 12/5 = 2.4 0.5 0.4  FeNO ppB 58  25  FeV1  0.96L/38% in May 2018 outside 1.11/425% -> 0.79/30% ratop 65 -> 39, DLCO 18.33/61%   Planned intervention  for visit Change anoro to breo low dose breo + get HRCT + blood allergy panel       IMPRESSION: CT chest Near complete re solution of ill-defined nodular opacity in medial left lower lobe, consistent with resolving inflammatory or infectious etiology.  Multiple other sub-cm bilateral pulmonary nodules remain stable, largest in right lung apex measuring 9 mm. Recommend continued follow-up by chest CT without contrast in 12 months. This recommendation follows the consensus statement: Guidelines for Management of Small Pulmonary Nodules Detected on CT Images: From the Fleischner Society 2017; Radiology 2017; 284:228-243.   Electronically Signed   By: Jasmine Neal M.D.   On: 01/23/2018 15:19   has a past medical history of Asthma, Breast cancer ([place]), Chronic airway obstruction, not elsewhere classified, Coronary artery calcification seen on CAT scan (01/23/2018), Dysplasia, Fatty liver, Fibromyalgia, nonmelanoma skin cancer, Hyperlipidemia, Hypothyroidism, and  Osteoarthritis.   reports that she quit smoking about 43 years ago. Her smoking use included cigarettes. She has a 8.00 pack-year smoking history. She has never used smokeless tobacco.  Past Surgical History:  Procedure Laterality Date  . BREAST LUMPECTOMY     on right, 1990 IIA radiation cmfp ajuction rx  . KNEE SURGERY    . NASAL SINUS SURGERY     on right done by Dr. Wilburn Cornelia    Allergies  Allergen Reactions  . Hydrocodone Itching    Face swelled up  . Hydrocodone-Acetaminophen Other (See Comments)    other    Immunization History  Administered Date(Jasmine) Administered  . Influenza Whole 04/10/2002, 06/01/2009, 05/05/2010, 05/22/2012, 02/20/2017  . Influenza,  High Dose Seasonal PF 04/22/2016  . Influenza,inj,Quad PF,6+ Mos 06/04/2013  . Influenza-Unspecified 05/23/2011  . Pneumococcal Conjugate-13 10/09/2013  . Pneumococcal Polysaccharide-23 05/05/2010  . Td 08/11/2006, 02/27/2017  . Zoster 09/11/2012    Family History  Problem Relation Age of Onset  . Other Father        mysthenia gravis  . Lung cancer Sister   . Osteoporosis Unknown   . Thyroid disease Unknown   . Breast cancer Mother 58       possible uterine cancer diagnosis at age 56  . Osteoporosis Mother        no hip fracture  . Breast cancer Sister 24  . Lung cancer Sister 53       former smoker  . Lung cancer Paternal Grandmother      Current Outpatient Medications:  .  acetaminophen (TYLENOL) 500 MG tablet, Take 1,000 mg by mouth every 6 (six) hours as needed. pain , Disp: , Rfl:  .  cholecalciferol (VITAMIN D) 1000 UNITS tablet, Take 1,000 Units by mouth daily., Disp: , Rfl:  .  cyanocobalamin (TH VITAMIN B12) 100 MCG tablet, Take by mouth., Disp: , Rfl:  .  diphenhydramine-acetaminophen (TYLENOL PM) 25-500 MG TABS, Take 1 tablet by mouth at bedtime as needed. sleep, Disp: , Rfl:  .  fluticasone furoate-vilanterol (BREO ELLIPTA) 100-25 MCG/INH AEPB, Inhale 1 puff into the lungs daily., Disp: 60  each, Rfl: 0 .  PARoxetine (PAXIL) 40 MG tablet, TAKE 1 TABLET BY MOUTH AT  BEDTIME, Disp: 90 tablet, Rfl: 1 .  SYNTHROID 88 MCG tablet, TAKE 1 TABLET BY MOUTH  DAILY, Disp: 90 tablet, Rfl: 1 .  valACYclovir (VALTREX) 1000 MG tablet, TAKE 2 TABLETS BY MOUTH TWICE DAILY AS DIRECTED, Disp: 30 tablet, Rfl: 0   Review of Systems     Objective:   Physical Exam  Constitutional: She is oriented to person, place, and time. She appears well-developed and well-nourished. No distress.  HENT:  Head: Normocephalic and atraumatic.  Right Ear: External ear normal.  Left Ear: External ear normal.  Mouth/Throat: Oropharynx is clear and moist. No oropharyngeal exudate.  Eyes: Pupils are equal, round, and reactive to light. Conjunctivae and EOM are normal. Right eye exhibits no discharge. Left eye exhibits no discharge. No scleral icterus.  Neck: Normal range of motion. Neck supple. No JVD present. No tracheal deviation present. No thyromegaly present.  Cardiovascular: Normal rate, regular rhythm, normal heart sounds and intact distal pulses. Exam reveals no gallop and no friction rub.  No murmur heard. Pulmonary/Chest: Effort normal and breath sounds normal. No respiratory distress. She has no wheezes. She has no rales. She exhibits no tenderness.  Abdominal: Soft. Bowel sounds are normal. She exhibits no distension and no mass. There is no tenderness. There is no rebound and no guarding.  Musculoskeletal: Normal range of motion. She exhibits no edema or tenderness.  Lymphadenopathy:    She has no cervical adenopathy.  Neurological: She is alert and oriented to person, place, and time. She has normal reflexes. No cranial nerve deficit. She exhibits normal muscle tone. Coordination normal.  Skin: Skin is warm and dry. No rash noted. She is not diaphoretic. No erythema. No pallor.  Psychiatric: She has a normal mood and affect. Her behavior is normal. Judgment and thought content normal.  Vitals  reviewed.   Vitals:   02/16/18 1113  BP: 118/70  Pulse: 71  SpO2: 97%  Weight: 159 lb (72.1 kg)  Height: 5\' 9"  (1.753 m)  Estimated body mass index is 23.48 kg/m as calculated from the following:   Height as of this encounter: 5\' 9"  (1.753 m).   Weight as of this encounter: 159 lb (72.1 kg).      Assessment:       ICD-10-CM   1. Moderate persistent asthma without complication E09.23   2. Multiple lung nodules on CT R91.8        Plan:     Moderate persistent asthma without complication  - stable and well controlled - continue breo as before - flu shot in fall - Please talk to PCP Panosh, Standley Brooking, MD -  and ensure you get  shingrix vaccine (more effective than zoster you got in 2014) - do spirometry and dlco at next visit in 9 months  Multiple lung nodules on CT  - largest in 59mmm Right upper lobe -stable march -> july 2019 - repeat ct chest without contrast in 9 months  Followup 9 months but after CT chest and lung function test   Dr. Brand Males, M.D., [facility].C.P Pulmonary and Critical Care Medicine Staff Physician, [place] Director - Interstitial Lung Disease  Program  [facility] [place] [facility] [place], [place], [address]  Pager: [phone], If no answer or between  15:00h - 7:00h: call [phone] Telephone: [phone]

## 2018-02-16 NOTE — Patient Instructions (Addendum)
Moderate persistent asthma without complication  - stable and well controlled - continue breo as before - flu shot in fall - Please talk to PCP Panosh, Standley Brooking, MD -  and ensure you get  shingrix vaccine (more effective than zoster you got in 2014) - do spirometry and dlco at next visit in 9 months  Multiple lung nodules on CT  - largest in 63mmm Right upper lobe -stable march -> july 2019 - repeat ct chest without contrast in 9 months  Followup 9 months but after CT chest and lung function test

## 2018-03-05 NOTE — Progress Notes (Signed)
Chief Complaint  Patient presents with  . Annual Exam    yearly cpx, no tfasting today.  needs refills of all meds.  needs new RX for the shingrix vaccine    HPI: Jasmine Neal 72 y.o. comes in today for Preventive Medicare exam/ wellness visit .Since last visit. Doing ok .   Thyroid needs refill lab    saw cards and pulm for cp felt to have asthma and not copd .  And doin better.  Under ct surveillance  Per dr R.   continued frequency would like to try medication no uti sx other progression  Goes every few hours and at night  No hx of OSA.   Gets some back left pain and lateral hip.  Needs refill valtrex for cold sores   Saw dr Radford Pax in July and  Planned scan echo and stress test   Doses she need to go back if doing ok?  Left back pain off and on some latera side leg with exercise    Health Maintenance  Topic Date Due  . INFLUENZA VACCINE  02/08/2018  . MAMMOGRAM  08/23/2018  . COLONOSCOPY  05/14/2023  . TETANUS/TDAP  02/28/2027  . DEXA SCAN  Completed  . Hepatitis C Screening  Completed  . PNA vac Low Risk Adult  Completed  last pap 2011  Pre 2008 in record nl  Health Maintenance Review LIFESTYLE:  Exercise:   Walks with dog  And   Tobacco/ETS: no Alcohol:  Red wine per day  And social  Sugar beverages:  No  Sleep: about  8   Nocturia .     And frequency  A lot .  Drug use: no HH:  1 1 dog    Hearing: ok  Vision:  No limitations at present . Last eye check UTDsame   Safety:  Has smoke detector and wears seat belts.. No excess sun exposure. Sees dentist regularly.  Falls: n  Memory: Felt to be good  , no concern from her or her family.  Depression: No anhedonia unusual crying or depressive symptoms  Nutrition: Eats well balanced diet; adequate calcium and vitamin D. No swallowing chewing problems.  Injury: no major injuries in the last six months.  Other healthcare providers:  Reviewed today .   Preventive parameters: up-to-date  Reviewed    ADLS:   There are no problems or need for assistance  driving, feeding, obtaining food, dressing, toileting and bathing, managing money using phone. She is independent.   ROS:  See hpi   Bump right upper gum goes away and comes back no pain or bleeding to see denitst GEN/ HEENT: No fever, significant weight changes sweats headaches vision problems hearing changes, CV/ PULM; No chest pain shortness of breath cough, syncope,edema  change in exercise tolerance. GI /GU: No adominal pain, vomiting, change in bowel habits. No blood in the stool. No significant GU symptoms. SKIN/HEME: ,no acute skin rashes suspicious lesions or bleeding. No lymphadenopathy, nodules, masses.  NEURO/ PSYCH:  No neurologic signs such as weakness numbness. No depression anxiety. IMM/ Allergy: No unusual infections.  Allergy .   REST of 12 system review negative except as per HPI   Past Medical History:  Diagnosis Date  . Asthma    evalutated by pulmonary severe by spirometry no smoker currently  . Breast cancer ([place])    hx of right lumpectomy, 1990 IIA radiation cmfp ajunct rx  . Chronic airway obstruction, not elsewhere classified   . Coronary  artery calcification seen on CAT scan 01/23/2018  . Dysplasia    hx treated with cerv conization  . Fatty liver    US done 2008/10/04  . Fibromyalgia   . Hx of nonmelanoma skin cancer     followed dr [place]   . Hyperlipidemia   . Hypothyroidism   . Osteoarthritis     Family History  Problem Relation Age of Onset  . Other Father        mysthenia gravis  . Lung cancer Sister   . Osteoporosis Unknown   . Thyroid disease Unknown   . Breast cancer Mother 45       possible uterine cancer diagnosis at age 52  . Osteoporosis Mother        no hip fracture  . Breast cancer Sister 10  . Lung cancer Sister 60       former smoker  . Lung cancer Paternal Grandmother     Social History   Socioeconomic History  . Marital status: Widowed    Spouse name: Not on file  .  Number of children: Not on file  . Years of education: Not on file  . Highest education level: Not on file  Occupational History  . Occupation: Retired Tour manager  . Financial resource strain: Not on file  . Food insecurity:    Worry: Not on file    Inability: Not on file  . Transportation needs:    Medical: Not on file    Non-medical: Not on file  Tobacco Use  . Smoking status: Former Smoker    Packs/day: 0.80    Years: 10.00    Pack years: 8.00    Types: Cigarettes    Last attempt to quit: 07/11/1974    Years since quitting: 43.6  . Smokeless tobacco: Never Used  Substance and Sexual Activity  . Alcohol use: Yes    Alcohol/week: 7.0 standard drinks    Types: 7 Standard drinks or equivalent per week    Comment: 1 glass of red wine nightly and socially  . Drug use: No  . Sexual activity: Yes    Partners: Male  Lifestyle  . Physical activity:    Days per week: Not on file    Minutes per session: Not on file  . Stress: Not on file  Relationships  . Social connections:    Talks on phone: Not on file    Gets together: Not on file    Attends religious service: Not on file    Active member of club or organization: Not on file    Attends meetings of clubs or organizations: Not on file    Relationship status: Not on file  Other Topics Concern  . Not on file  Social History Narrative   Retired Pharmacist, hospital    hh of 1   Now has puppy    Quit smoking 30 + years ago. 20 year pack year smoking hx   Regular exercise- some  treadmill    8 hours sleep         Caffeine use: 1-2 cups of coffee/[employer] instant   Husband dx glioblastoma fall 15   Passed away fall 05-Oct-2014    Outpatient Encounter Medications as of 03/06/2018  Medication Sig  . acetaminophen (TYLENOL) 500 MG tablet Take 1,000 mg by mouth every 6 (six) hours as needed. pain   . cholecalciferol (VITAMIN D) 1000 UNITS tablet Take 1,000 Units by mouth daily.  . cyanocobalamin (TH VITAMIN B12) 100 MCG  tablet Take by  mouth.  . diphenhydramine-acetaminophen (TYLENOL PM) 25-500 MG TABS Take 1 tablet by mouth at bedtime as needed. sleep  . fluticasone furoate-vilanterol (BREO ELLIPTA) 100-25 MCG/INH AEPB Inhale 1 puff into the lungs daily.  Marland Kitchen PARoxetine (PAXIL) 40 MG tablet Take 1 tablet (40 mg total) by mouth at bedtime.  Marland Kitchen SYNTHROID 88 MCG tablet Take 1 tablet (88 mcg total) by mouth daily.  . valACYclovir (VALTREX) 1000 MG tablet TAKE 2 TABLETS BY MOUTH TWICE DAILY AS DIRECTED  . vitamin C (ASCORBIC ACID) 500 MG tablet Take 500 mg by mouth daily.  . [DISCONTINUED] PARoxetine (PAXIL) 40 MG tablet TAKE 1 TABLET BY MOUTH AT  BEDTIME  . [DISCONTINUED] SYNTHROID 88 MCG tablet TAKE 1 TABLET BY MOUTH  DAILY  . [DISCONTINUED] valACYclovir (VALTREX) 1000 MG tablet TAKE 2 TABLETS BY MOUTH TWICE DAILY AS DIRECTED  . mirabegron ER (MYRBETRIQ) 25 MG TB24 tablet Take 1 tablet (25 mg total) by mouth daily. May increase to 50 mg per day if needed   No facility-administered encounter medications on file as of 03/06/2018.     EXAM:  BP 118/74 (BP Location: Left Arm, Patient Position: Sitting, Cuff Size: Normal)   Pulse 74   Temp 98.1 F (36.7 C) (Oral)   Ht '5\' 9"'$  (1.753 m)   Wt 158 lb (71.7 kg)   SpO2 97%   BMI 23.33 kg/m   Body mass index is 23.33 kg/m.  Physical Exam: Vital signs reviewed OZD:GUYQ is a well-developed well-nourished alert cooperative   who appears stated age in no acute distress.  HEENT: normocephalic atraumatic , Eyes: PERRL EOM's full, conjunctiva clear, Nares: paten,t no deformity discharge or tenderness., Ears: no deformity EAC's clear TMs with normal landmarks. Mouth: RUgum a 2-3 mm warty round lesion pink  OP, no lesions, edema.  Moist mucous membranes. Dentition in adequate repair. NECK: supple without masses, thyromegaly or bruits. CHEST/PULM:  Clear to auscultation and percussion breath sounds equal no wheeze , rales or rhonchi. No chest wall deformities or tenderness. CV: PMI is  nondisplaced, S1 S2 no gallops, murmurs, rubs. Peripheral pulses are full without delay.No JVD .  ABDOMEN: Bowel sounds normal nontender  No guard or rebound, no hepato splenomegal no CVA tenderness.   Extremtities:  No clubbing cyanosis or edema, no acute joint swelling or redness no focal atrophy NEURO:  Oriented x3, cranial nerves 3-12 appear to be intact, no obvious focal weakness,gait within normal limits no abnormal reflexes or asymmetrical SKIN: No acute rashes normal turgor, color, no bruising or petechiae. PSYCH: Oriented, good eye contact, no obvious depression anxiety, cognition and judgment appear normal. LN: no cervical axillary inguinal adenopathy No noted deficits in memory, attention, and speech.   Lab Results  Component Value Date   WBC 5.9 09/22/2017   HGB 14.2 09/22/2017   HCT 42.8 09/22/2017   PLT 176.0 09/22/2017   GLUCOSE 90 02/27/2017   CHOL 168 02/27/2017   TRIG 81.0 02/27/2017   HDL 52.90 02/27/2017   LDLDIRECT 137.0 09/11/2012   LDLCALC 99 02/27/2017   ALT 20 02/27/2017   AST 20 02/27/2017   NA 141 02/27/2017   K 4.4 02/27/2017   CL 105 02/27/2017   CREATININE 0.82 02/27/2017   BUN 16 02/27/2017   CO2 30 02/27/2017   TSH 1.29 02/27/2017    ASSESSMENT AND PLAN:  Discussed the following assessment and plan:  Visit for preventive health examination  Medication management - Plan: Basic metabolic panel, CBC with Differential/Platelet, Hepatic function  panel, Lipid panel, TSH, POCT Urinalysis Dipstick (Automated)  Hypothyroidism, unspecified type - Plan: Basic metabolic panel, CBC with Differential/Platelet, Hepatic function panel, Lipid panel, TSH, POCT Urinalysis Dipstick (Automated)  Pure hypercholesterolemia - Plan: Basic metabolic panel, CBC with Differential/Platelet, Hepatic function panel, Lipid panel, TSH, POCT Urinalysis Dipstick (Automated)  Urinary frequency - Plan: Basic metabolic panel, CBC with Differential/Platelet, Hepatic function  panel, Lipid panel, TSH, POCT Urinalysis Dipstick (Automated)  History of breast cancer Record review  Confusion .  Says on atorvastatin but not on med list  Echo and stress tests ok x min ar mr nl ef Disc  Neg braca g but other genetic marker and no other cancer in family  cx dyplasia not related  Has had normals for years  But  Consider seeing gyne for   Monitoring  Risk of ovarian  melanoma and colon can be related  Ok to try mybetrec  Not  A high risk me like any of the others  Get shingrix at pharmacy advised .    Patient Care Team: Rahul Malinak, Standley Brooking, MD as PCP - Adelene Idler, MD [place], Amy, MD as Consulting Physician (Dermatology) Neldon Mc Donnamarie Poag, MD as Consulting Physician (Allergy and Immunology) Marygrace Drought, MD as Consulting Physician (Ophthalmology) Clent Jacks, MD as Consulting Physician (Ophthalmology) Neldon Mc, Donnamarie Poag, MD as Consulting Physician (Allergy and Immunology) Brand Males, MD as Consulting Physician (Pulmonary Disease)  Patient Instructions  Can try new medication  For urine frequency . After a month can increase to 50 mg if needed and let us know how working .   Get appt for fasting lab tests.  Add resistance training . Get   Flu vaccine in fall  shingrix at your pharmacy . Agree get dental to see the bump on your gum.        Health Maintenance, Female Adopting a healthy lifestyle and getting preventive care can go a long way to promote health and wellness. Talk with your health care provider about what schedule of regular examinations is right for you. This is a good chance for you to check in with your provider about disease prevention and staying healthy. In between checkups, there are plenty of things you can do on your own. Experts have done a lot of research about which lifestyle changes and preventive measures are most likely to keep you healthy. Ask your health care provider for more information. Weight and diet Eat a healthy  diet  Be sure to include plenty of vegetables, fruits, low-fat dairy products, and lean protein.  Do not eat a lot of foods high in solid fats, added sugars, or salt.  Get regular exercise. This is one of the most important things you can do for your health. ? Most adults should exercise for at least 150 minutes each week. The exercise should increase your heart rate and make you sweat (moderate-intensity exercise). ? Most adults should also do strengthening exercises at least twice a week. This is in addition to the moderate-intensity exercise.  Maintain a healthy weight  Body mass index (BMI) is a measurement that can be used to identify possible weight problems. It estimates body fat based on height and weight. Your health care provider can help determine your BMI and help you achieve or maintain a healthy weight.  For females 29 years of age and older: ? A BMI below 18.5 is considered underweight. ? A BMI of 18.5 to 24.9 is normal. ? A BMI of 25 to 29.9 is considered  overweight. ? A BMI of 30 and above is considered obese.  Watch levels of cholesterol and blood lipids  You should start having your blood tested for lipids and cholesterol at 73 years of age, then have this test every 5 years.  You may need to have your cholesterol levels checked more often if: ? Your lipid or cholesterol levels are high. ? You are older than 73 years of age. ? You are at high risk for heart disease.  Cancer screening Lung Cancer  Lung cancer screening is recommended for adults 77-58 years old who are at high risk for lung cancer because of a history of smoking.  A yearly low-dose CT scan of the lungs is recommended for people who: ? Currently smoke. ? Have quit within the past 15 years. ? Have at least a 30-pack-year history of smoking. A pack year is smoking an average of one pack of cigarettes a day for 1 year.  Yearly screening should continue until it has been 15 years since you  quit.  Yearly screening should stop if you develop a health problem that would prevent you from having lung cancer treatment.  Breast Cancer  Practice breast self-awareness. This means understanding how your breasts normally appear and feel.  It also means doing regular breast self-exams. Let your health care provider know about any changes, no matter how small.  If you are in your 20s or 30s, you should have a clinical breast exam (CBE) by a health care provider every 1-3 years as part of a regular health exam.  If you are 58 or older, have a CBE every year. Also consider having a breast X-ray (mammogram) every year.  If you have a family history of breast cancer, talk to your health care provider about genetic screening.  If you are at high risk for breast cancer, talk to your health care provider about having an MRI and a mammogram every year.  Breast cancer gene (BRCA) assessment is recommended for women who have family members with BRCA-related cancers. BRCA-related cancers include: ? Breast. ? Ovarian. ? Tubal. ? Peritoneal cancers.  Results of the assessment will determine the need for genetic counseling and BRCA1 and BRCA2 testing.  Cervical Cancer Your health care provider may recommend that you be screened regularly for cancer of the pelvic organs (ovaries, uterus, and vagina). This screening involves a pelvic examination, including checking for microscopic changes to the surface of your cervix (Pap test). You may be encouraged to have this screening done every 3 years, beginning at age 42.  For women ages 7-65, health care providers may recommend pelvic exams and Pap testing every 3 years, or they may recommend the Pap and pelvic exam, combined with testing for human papilloma virus (HPV), every 5 years. Some types of HPV increase your risk of cervical cancer. Testing for HPV may also be done on women of any age with unclear Pap test results.  Other health care providers  may not recommend any screening for nonpregnant women who are considered low risk for pelvic cancer and who do not have symptoms. Ask your health care provider if a screening pelvic exam is right for you.  If you have had past treatment for cervical cancer or a condition that could lead to cancer, you need Pap tests and screening for cancer for at least 20 years after your treatment. If Pap tests have been discontinued, your risk factors (such as having a new sexual partner) need to be reassessed to  determine if screening should resume. Some women have medical problems that increase the chance of getting cervical cancer. In these cases, your health care provider may recommend more frequent screening and Pap tests.  Colorectal Cancer  This type of cancer can be detected and often prevented.  Routine colorectal cancer screening usually begins at 73 years of age and continues through 73 years of age.  Your health care provider may recommend screening at an earlier age if you have risk factors for colon cancer.  Your health care provider may also recommend using home test kits to check for hidden blood in the stool.  A small camera at the end of a tube can be used to examine your colon directly (sigmoidoscopy or colonoscopy). This is done to check for the earliest forms of colorectal cancer.  Routine screening usually begins at age 54.  Direct examination of the colon should be repeated every 5-10 years through 73 years of age. However, you may need to be screened more often if early forms of precancerous polyps or small growths are found.  Skin Cancer  Check your skin from head to toe regularly.  Tell your health care provider about any new moles or changes in moles, especially if there is a change in a mole's shape or color.  Also tell your health care provider if you have a mole that is larger than the size of a pencil eraser.  Always use sunscreen. Apply sunscreen liberally and repeatedly  throughout the day.  Protect yourself by wearing long sleeves, pants, a wide-brimmed hat, and sunglasses whenever you are outside.  Heart disease, diabetes, and high blood pressure  High blood pressure causes heart disease and increases the risk of stroke. High blood pressure is more likely to develop in: ? People who have blood pressure in the high end of the normal range (130-139/85-89 mm Hg). ? People who are overweight or obese. ? People who are African American.  If you are 31-18 years of age, have your blood pressure checked every 3-5 years. If you are 52 years of age or older, have your blood pressure checked every year. You should have your blood pressure measured twice-once when you are at a hospital or clinic, and once when you are not at a hospital or clinic. Record the average of the two measurements. To check your blood pressure when you are not at a hospital or clinic, you can use: ? An automated blood pressure machine at a pharmacy. ? A home blood pressure monitor.  If you are between 78 years and 58 years old, ask your health care provider if you should take aspirin to prevent strokes.  Have regular diabetes screenings. This involves taking a blood sample to check your fasting blood sugar level. ? If you are at a normal weight and have a low risk for diabetes, have this test once every three years after 73 years of age. ? If you are overweight and have a high risk for diabetes, consider being tested at a younger age or more often. Preventing infection Hepatitis B  If you have a higher risk for hepatitis B, you should be screened for this virus. You are considered at high risk for hepatitis B if: ? You were born in a country where hepatitis B is common. Ask your health care provider which countries are considered high risk. ? Your parents were born in a high-risk country, and you have not been immunized against hepatitis B (hepatitis B vaccine). ? You  have HIV or AIDS. ? You  use needles to inject street drugs. ? You live with someone who has hepatitis B. ? You have had sex with someone who has hepatitis B. ? You get hemodialysis treatment. ? You take certain medicines for conditions, including cancer, organ transplantation, and autoimmune conditions.  Hepatitis C  Blood testing is recommended for: ? Everyone born from [birth year] through 1966. ? Anyone with known risk factors for hepatitis C.  Sexually transmitted infections (STIs)  You should be screened for sexually transmitted infections (STIs) including gonorrhea and chlamydia if: ? You are sexually active and are younger than 73 years of age. ? You are older than 73 years of age and your health care provider tells you that you are at risk for this type of infection. ? Your sexual activity has changed since you were last screened and you are at an increased risk for chlamydia or gonorrhea. Ask your health care provider if you are at risk.  If you do not have HIV, but are at risk, it may be recommended that you take a prescription medicine daily to prevent HIV infection. This is called pre-exposure prophylaxis (PrEP). You are considered at risk if: ? You are sexually active and do not regularly use condoms or know the HIV status of your partner(s). ? You take drugs by injection. ? You are sexually active with a partner who has HIV.  Talk with your health care provider about whether you are at high risk of being infected with HIV. If you choose to begin PrEP, you should first be tested for HIV. You should then be tested every 3 months for as long as you are taking PrEP. Pregnancy  If you are premenopausal and you may become pregnant, ask your health care provider about preconception counseling.  If you may become pregnant, take 400 to 800 micrograms (mcg) of folic acid every day.  If you want to prevent pregnancy, talk to your health care provider about birth control (contraception). Osteoporosis and  menopause  Osteoporosis is a disease in which the bones lose minerals and strength with aging. This can result in serious bone fractures. Your risk for osteoporosis can be identified using a bone density scan.  If you are 58 years of age or older, or if you are at risk for osteoporosis and fractures, ask your health care provider if you should be screened.  Ask your health care provider whether you should take a calcium or vitamin D supplement to lower your risk for osteoporosis.  Menopause may have certain physical symptoms and risks.  Hormone replacement therapy may reduce some of these symptoms and risks. Talk to your health care provider about whether hormone replacement therapy is right for you. Follow these instructions at home:  Schedule regular health, dental, and eye exams.  Stay current with your immunizations.  Do not use any tobacco products including cigarettes, chewing tobacco, or electronic cigarettes.  If you are pregnant, do not drink alcohol.  If you are breastfeeding, limit how much and how often you drink alcohol.  Limit alcohol intake to no more than 1 drink per day for nonpregnant women. One drink equals 12 ounces of beer, 5 ounces of wine, or 1 ounces of hard liquor.  Do not use street drugs.  Do not share needles.  Ask your health care provider for help if you need support or information about quitting drugs.  Tell your health care provider if you often feel depressed.  Tell your health  care provider if you have ever been abused or do not feel safe at home. This information is not intended to replace advice given to you by your health care provider. Make sure you discuss any questions you have with your health care provider. Document Released: 01/10/2011 Document Revised: 12/03/2015 Document Reviewed: 03/31/2015 Elsevier Interactive Patient Education  2018 [employer]. Jolin Benavides M.D.

## 2018-03-06 ENCOUNTER — Encounter: Payer: Self-pay | Admitting: Internal Medicine

## 2018-03-06 ENCOUNTER — Ambulatory Visit (INDEPENDENT_AMBULATORY_CARE_PROVIDER_SITE_OTHER): Payer: Medicare Other | Admitting: Internal Medicine

## 2018-03-06 VITALS — BP 118/74 | HR 74 | Temp 98.1°F | Ht 69.0 in | Wt 158.0 lb

## 2018-03-06 DIAGNOSIS — Z79899 Other long term (current) drug therapy: Secondary | ICD-10-CM

## 2018-03-06 DIAGNOSIS — Z853 Personal history of malignant neoplasm of breast: Secondary | ICD-10-CM

## 2018-03-06 DIAGNOSIS — E039 Hypothyroidism, unspecified: Secondary | ICD-10-CM

## 2018-03-06 DIAGNOSIS — E7800 Pure hypercholesterolemia, unspecified: Secondary | ICD-10-CM | POA: Diagnosis not present

## 2018-03-06 DIAGNOSIS — R350 Frequency of micturition: Secondary | ICD-10-CM | POA: Diagnosis not present

## 2018-03-06 DIAGNOSIS — Z0000 Encounter for general adult medical examination without abnormal findings: Secondary | ICD-10-CM | POA: Diagnosis not present

## 2018-03-06 MED ORDER — PAROXETINE HCL 40 MG PO TABS: 40.0000 mg | ORAL_TABLET | Freq: Every day | ORAL | 3 refills | Status: DC

## 2018-03-06 MED ORDER — SYNTHROID 88 MCG PO TABS: 88.0000 ug | ORAL_TABLET | Freq: Every day | ORAL | 3 refills | Status: DC

## 2018-03-06 MED ORDER — VALACYCLOVIR HCL 1 G PO TABS: ORAL_TABLET | ORAL | 99 refills | Status: DC

## 2018-03-06 MED ORDER — MIRABEGRON ER 25 MG PO TB24: 25.0000 mg | ORAL_TABLET | Freq: Every day | ORAL | 1 refills | Status: DC

## 2018-03-06 NOTE — Patient Instructions (Addendum)
Can try new medication  For urine frequency . After a month can increase to 50 mg if needed and let us know how working .   Get appt for fasting lab tests.  Add resistance training . Get   Flu vaccine in fall  shingrix at your pharmacy . Agree get dental to see the bump on your gum.        Health Maintenance, Female Adopting a healthy lifestyle and getting preventive care can go a long way to promote health and wellness. Talk with your health care provider about what schedule of regular examinations is right for you. This is a good chance for you to check in with your provider about disease prevention and staying healthy. In between checkups, there are plenty of things you can do on your own. Experts have done a lot of research about which lifestyle changes and preventive measures are most likely to keep you healthy. Ask your health care provider for more information. Weight and diet Eat a healthy diet  Be sure to include plenty of vegetables, fruits, low-fat dairy products, and lean protein.  Do not eat a lot of foods high in solid fats, added sugars, or salt.  Get regular exercise. This is one of the most important things you can do for your health. ? Most adults should exercise for at least 150 minutes each week. The exercise should increase your heart rate and make you sweat (moderate-intensity exercise). ? Most adults should also do strengthening exercises at least twice a week. This is in addition to the moderate-intensity exercise.  Maintain a healthy weight  Body mass index (BMI) is a measurement that can be used to identify possible weight problems. It estimates body fat based on height and weight. Your health care provider can help determine your BMI and help you achieve or maintain a healthy weight.  For females 73 years of age and older: ? A BMI below 18.5 is considered underweight. ? A BMI of 18.5 to 24.9 is normal. ? A BMI of 25 to 29.9 is considered overweight. ? A  BMI of 30 and above is considered obese.  Watch levels of cholesterol and blood lipids  You should start having your blood tested for lipids and cholesterol at 73 years of age, then have this test every 5 years.  You may need to have your cholesterol levels checked more often if: ? Your lipid or cholesterol levels are high. ? You are older than 73 years of age. ? You are at high risk for heart disease.  Cancer screening Lung Cancer  Lung cancer screening is recommended for adults 73-73 years old who are at high risk for lung cancer because of a history of smoking.  A yearly low-dose CT scan of the lungs is recommended for people who: ? Currently smoke. ? Have quit within the past 15 years. ? Have at least a 30-pack-year history of smoking. A pack year is smoking an average of one pack of cigarettes a day for 1 year.  Yearly screening should continue until it has been 15 years since you quit.  Yearly screening should stop if you develop a health problem that would prevent you from having lung cancer treatment.  Breast Cancer  Practice breast self-awareness. This means understanding how your breasts normally appear and feel.  It also means doing regular breast self-exams. Let your health care provider know about any changes, no matter how small.  If you are in your 73 or 73s, you  should have a clinical breast exam (CBE) by a health care provider every 1-3 years as part of a regular health exam.  If you are 73 or older, have a CBE every year. Also consider having a breast X-ray (mammogram) every year.  If you have a family history of breast cancer, talk to your health care provider about genetic screening.  If you are at high risk for breast cancer, talk to your health care provider about having an MRI and a mammogram every year.  Breast cancer gene (BRCA) assessment is recommended for women who have family members with BRCA-related cancers. BRCA-related cancers  include: ? Breast. ? Ovarian. ? Tubal. ? Peritoneal cancers.  Results of the assessment will determine the need for genetic counseling and BRCA1 and BRCA2 testing.  Cervical Cancer Your health care provider may recommend that you be screened regularly for cancer of the pelvic organs (ovaries, uterus, and vagina). This screening involves a pelvic examination, including checking for microscopic changes to the surface of your cervix (Pap test). You may be encouraged to have this screening done every 3 years, beginning at age 15.  For women ages 28-65, health care providers may recommend pelvic exams and Pap testing every 3 years, or they may recommend the Pap and pelvic exam, combined with testing for human papilloma virus (HPV), every 5 years. Some types of HPV increase your risk of cervical cancer. Testing for HPV may also be done on women of any age with unclear Pap test results.  Other health care providers may not recommend any screening for nonpregnant women who are considered low risk for pelvic cancer and who do not have symptoms. Ask your health care provider if a screening pelvic exam is right for you.  If you have had past treatment for cervical cancer or a condition that could lead to cancer, you need Pap tests and screening for cancer for at least 20 years after your treatment. If Pap tests have been discontinued, your risk factors (such as having a new sexual partner) need to be reassessed to determine if screening should resume. Some women have medical problems that increase the chance of getting cervical cancer. In these cases, your health care provider may recommend more frequent screening and Pap tests.  Colorectal Cancer  This type of cancer can be detected and often prevented.  Routine colorectal cancer screening usually begins at 73 years of age and continues through 73 years of age.  Your health care provider may recommend screening at an earlier age if you have risk factors  for colon cancer.  Your health care provider may also recommend using home test kits to check for hidden blood in the stool.  A small camera at the end of a tube can be used to examine your colon directly (sigmoidoscopy or colonoscopy). This is done to check for the earliest forms of colorectal cancer.  Routine screening usually begins at age 30.  Direct examination of the colon should be repeated every 5-10 years through 73 years of age. However, you may need to be screened more often if early forms of precancerous polyps or small growths are found.  Skin Cancer  Check your skin from head to toe regularly.  Tell your health care provider about any new moles or changes in moles, especially if there is a change in a mole's shape or color.  Also tell your health care provider if you have a mole that is larger than the size of a pencil eraser.  Always use sunscreen. Apply sunscreen liberally and repeatedly throughout the day.  Protect yourself by wearing long sleeves, pants, a wide-brimmed hat, and sunglasses whenever you are outside.  Heart disease, diabetes, and high blood pressure  High blood pressure causes heart disease and increases the risk of stroke. High blood pressure is more likely to develop in: ? People who have blood pressure in the high end of the normal range (130-139/85-89 mm Hg). ? People who are overweight or obese. ? People who are African American.  If you are 54-75 years of age, have your blood pressure checked every 3-5 years. If you are 36 years of age or older, have your blood pressure checked every year. You should have your blood pressure measured twice-once when you are at a hospital or clinic, and once when you are not at a hospital or clinic. Record the average of the two measurements. To check your blood pressure when you are not at a hospital or clinic, you can use: ? An automated blood pressure machine at a pharmacy. ? A home blood pressure monitor.  If  you are between 36 years and 38 years old, ask your health care provider if you should take aspirin to prevent strokes.  Have regular diabetes screenings. This involves taking a blood sample to check your fasting blood sugar level. ? If you are at a normal weight and have a low risk for diabetes, have this test once every three years after 73 years of age. ? If you are overweight and have a high risk for diabetes, consider being tested at a younger age or more often. Preventing infection Hepatitis B  If you have a higher risk for hepatitis B, you should be screened for this virus. You are considered at high risk for hepatitis B if: ? You were born in a country where hepatitis B is common. Ask your health care provider which countries are considered high risk. ? Your parents were born in a high-risk country, and you have not been immunized against hepatitis B (hepatitis B vaccine). ? You have HIV or AIDS. ? You use needles to inject street drugs. ? You live with someone who has hepatitis B. ? You have had sex with someone who has hepatitis B. ? You get hemodialysis treatment. ? You take certain medicines for conditions, including cancer, organ transplantation, and autoimmune conditions.  Hepatitis C  Blood testing is recommended for: ? Everyone born from [birth year] through 1966. ? Anyone with known risk factors for hepatitis C.  Sexually transmitted infections (STIs)  You should be screened for sexually transmitted infections (STIs) including gonorrhea and chlamydia if: ? You are sexually active and are younger than 73 years of age. ? You are older than 73 years of age and your health care provider tells you that you are at risk for this type of infection. ? Your sexual activity has changed since you were last screened and you are at an increased risk for chlamydia or gonorrhea. Ask your health care provider if you are at risk.  If you do not have HIV, but are at risk, it may be recommended  that you take a prescription medicine daily to prevent HIV infection. This is called pre-exposure prophylaxis (PrEP). You are considered at risk if: ? You are sexually active and do not regularly use condoms or know the HIV status of your partner(s). ? You take drugs by injection. ? You are sexually active with a partner who has HIV.  Talk with  your health care provider about whether you are at high risk of being infected with HIV. If you choose to begin PrEP, you should first be tested for HIV. You should then be tested every 3 months for as long as you are taking PrEP. Pregnancy  If you are premenopausal and you may become pregnant, ask your health care provider about preconception counseling.  If you may become pregnant, take 400 to 800 micrograms (mcg) of folic acid every day.  If you want to prevent pregnancy, talk to your health care provider about birth control (contraception). Osteoporosis and menopause  Osteoporosis is a disease in which the bones lose minerals and strength with aging. This can result in serious bone fractures. Your risk for osteoporosis can be identified using a bone density scan.  If you are 92 years of age or older, or if you are at risk for osteoporosis and fractures, ask your health care provider if you should be screened.  Ask your health care provider whether you should take a calcium or vitamin D supplement to lower your risk for osteoporosis.  Menopause may have certain physical symptoms and risks.  Hormone replacement therapy may reduce some of these symptoms and risks. Talk to your health care provider about whether hormone replacement therapy is right for you. Follow these instructions at home:  Schedule regular health, dental, and eye exams.  Stay current with your immunizations.  Do not use any tobacco products including cigarettes, chewing tobacco, or electronic cigarettes.  If you are pregnant, do not drink alcohol.  If you are  breastfeeding, limit how much and how often you drink alcohol.  Limit alcohol intake to no more than 1 drink per day for nonpregnant women. One drink equals 12 ounces of beer, 5 ounces of wine, or 1 ounces of hard liquor.  Do not use street drugs.  Do not share needles.  Ask your health care provider for help if you need support or information about quitting drugs.  Tell your health care provider if you often feel depressed.  Tell your health care provider if you have ever been abused or do not feel safe at home. This information is not intended to replace advice given to you by your health care provider. Make sure you discuss any questions you have with your health care provider. Document Released: 01/10/2011 Document Revised: 12/03/2015 Document Reviewed: 03/31/2015 Elsevier Interactive Patient Education  [employer].

## 2018-03-07 LAB — HEPATIC FUNCTION PANEL
ALBUMIN: 4.1 g/dL (ref 3.5–5.2)
ALK PHOS: 70 U/L (ref 39–117)
ALT: 20 U/L (ref 0–35)
AST: 18 U/L (ref 0–37)
Bilirubin, Direct: 0.1 mg/dL (ref 0.0–0.3)
TOTAL PROTEIN: 6.4 g/dL (ref 6.0–8.3)
Total Bilirubin: 0.7 mg/dL (ref 0.2–1.2)

## 2018-03-07 LAB — BASIC METABOLIC PANEL
BUN: 22 mg/dL (ref 6–23)
CALCIUM: 9.7 mg/dL (ref 8.4–10.5)
CO2: 29 meq/L (ref 19–32)
Chloride: 103 mEq/L (ref 96–112)
Creatinine, Ser: 0.85 mg/dL (ref 0.40–1.20)
GFR: 69.62 mL/min (ref >60.00)
Glucose, Bld: 87 mg/dL (ref 70–99)
Potassium: 4.2 mEq/L (ref 3.5–5.1)
SODIUM: 139 meq/L (ref 135–145)

## 2018-03-07 LAB — CBC WITH DIFFERENTIAL/PLATELET
Basophils Absolute: 0 10*3/uL (ref 0.0–0.1)
Basophils Relative: 0.7 % (ref 0.0–3.0)
EOS PCT: 4.6 % (ref 0.0–5.0)
Eosinophils Absolute: 0.2 10*3/uL (ref 0.0–0.7)
HCT: 41.6 % (ref 36.0–46.0)
Hemoglobin: 13.9 g/dL (ref 12.0–15.0)
LYMPHS ABS: 1.6 10*3/uL (ref 0.7–4.0)
Lymphocytes Relative: 34.8 % (ref 12.0–46.0)
MCHC: 33.4 g/dL (ref 30.0–36.0)
MCV: 92.9 fl (ref 78.0–100.0)
MONO ABS: 0.3 10*3/uL (ref 0.1–1.0)
Monocytes Relative: 7.7 % (ref 3.0–12.0)
NEUTROS PCT: 52.2 % (ref 43.0–77.0)
Neutro Abs: 2.3 10*3/uL (ref 1.4–7.7)
Platelets: 170 10*3/uL (ref 150.0–400.0)
RBC: 4.48 Mil/uL (ref 3.87–5.11)
RDW: 13.3 % (ref 11.5–15.5)
WBC: 4.5 10*3/uL (ref 4.0–10.5)

## 2018-03-07 LAB — POC URINALSYSI DIPSTICK (AUTOMATED)
Bilirubin, UA: NEGATIVE
Blood, UA: NEGATIVE
Glucose, UA: NEGATIVE
Ketones, UA: NEGATIVE
LEUKOCYTES UA: NEGATIVE
NITRITE UA: NEGATIVE
Protein, UA: NEGATIVE
Spec Grav, UA: >=1.03 — AB (ref 1.010–1.025)
UROBILINOGEN UA: 0.2 U/dL
pH, UA: 5 (ref 5.0–8.0)

## 2018-03-07 LAB — LIPID PANEL
CHOLESTEROL: 185 mg/dL (ref 0–200)
HDL: 58.5 mg/dL (ref >39.00)
LDL Cholesterol: 111 mg/dL — ABNORMAL HIGH (ref 0–99)
NonHDL: 126.28
TRIGLYCERIDES: 75 mg/dL (ref 0.0–149.0)
Total CHOL/HDL Ratio: 3
VLDL: 15 mg/dL (ref 0.0–40.0)

## 2018-03-07 LAB — TSH: TSH: 0.44 u[IU]/mL (ref 0.35–4.50)

## 2018-03-07 NOTE — Addendum Note (Signed)
Addended by: Rene Kocher on: 03/07/2018 07:48 AM   Modules accepted: Orders

## 2018-03-09 NOTE — Telephone Encounter (Signed)
Per patient she is NOT on Atorvastatin ... Recent lab results you mentioned her being on this and reaching out to Dr Radford Pax. Pt states that she is not on this.   Please advise Dr Regis Bill, thanks.

## 2018-03-13 ENCOUNTER — Telehealth: Payer: Self-pay

## 2018-03-13 ENCOUNTER — Other Ambulatory Visit: Payer: Self-pay

## 2018-03-13 DIAGNOSIS — R9389 Abnormal findings on diagnostic imaging of other specified body structures: Secondary | ICD-10-CM

## 2018-03-13 NOTE — Telephone Encounter (Signed)
-----   Message from Sueanne Margarita, MD sent at 03/13/2018 11:40 AM EDT ----- Yes please Thank you  Fransico Him ----- Message ----- From: Frederik Schmidt, RN Sent: 03/13/2018   8:59 AM EDT To: Sueanne Margarita, MD  Would you like me to order Calcium Score only?

## 2018-03-13 NOTE — Telephone Encounter (Signed)
Notes recorded by Frederik Schmidt, RN on 03/13/2018 at 12:35 PM EDT Spoke with patient and informed her of Calcium Score test. She verbalized understanding and will wait for our call to schedule. ------

## 2018-03-27 ENCOUNTER — Ambulatory Visit (INDEPENDENT_AMBULATORY_CARE_PROVIDER_SITE_OTHER)
Admission: RE | Admit: 2018-03-27 | Discharge: 2018-03-27 | Disposition: A | Discharge status: Home / Self Care | Payer: Medicare Other | Source: Ambulatory Visit | Attending: Cardiology | Admitting: Cardiology

## 2018-03-27 DIAGNOSIS — R9389 Abnormal findings on diagnostic imaging of other specified body structures: Secondary | ICD-10-CM

## 2018-03-30 ENCOUNTER — Telehealth: Payer: Self-pay

## 2018-03-30 DIAGNOSIS — Z79899 Other long term (current) drug therapy: Secondary | ICD-10-CM

## 2018-03-30 DIAGNOSIS — E7800 Pure hypercholesterolemia, unspecified: Secondary | ICD-10-CM

## 2018-03-30 NOTE — Telephone Encounter (Signed)
-----   Message from Nuala Alpha, LPN sent at 02/18/2547  9:58 AM EDT -----   ----- Message ----- From: Dorothy Spark, MD Sent: 03/28/2018   9:39 AM EDT To: [facility] Triage  Very low calcium score of 3 . This was 31 rd percentile for age and sex matched control. I would continue the same dose of atorvastatin

## 2018-03-30 NOTE — Telephone Encounter (Signed)
Spoke with patient and she accepted starting atorvastatin 40 mg, daily. A 1 month fasting lipid/liver was scheduled for 10/23. The patient had no further questions.       Notes recorded by Dorothy Spark, MD on 03/30/2018 at 6:57 AM EDT Please start atorvastatin 40 mg po daily, repeat lipids and LFTs in 1 month

## 2018-04-03 ENCOUNTER — Telehealth: Payer: Self-pay | Admitting: Cardiology

## 2018-04-03 MED ORDER — ATORVASTATIN CALCIUM 40 MG PO TABS: 40.0000 mg | ORAL_TABLET | Freq: Every day | ORAL | 3 refills | Status: DC

## 2018-04-03 NOTE — Telephone Encounter (Signed)
RX sent in to [facility] for Lipitor per pt request.

## 2018-04-03 NOTE — Telephone Encounter (Signed)
New Message:      Pt c/o medication issue:  1. Name of Medication: Lipitor  2. How are you currently taking this medication (dosage and times per day)?   3. Are you having a reaction (difficulty breathing--STAT)? No  4. What is your medication issue? Pt states the last time she was in the office they told her a prescription for this medication would be sent in for her. The pharmacy has not yet received this information. The pt would like this sent to the [facility] on [address].

## 2018-05-02 ENCOUNTER — Other Ambulatory Visit: Payer: Medicare Other | Admitting: *Deleted

## 2018-05-02 DIAGNOSIS — Z79899 Other long term (current) drug therapy: Secondary | ICD-10-CM

## 2018-05-02 DIAGNOSIS — E7800 Pure hypercholesterolemia, unspecified: Secondary | ICD-10-CM

## 2018-05-02 LAB — LIPID PANEL
Chol/HDL Ratio: 2.2 ratio (ref 0.0–4.4)
Cholesterol, Total: 157 mg/dL (ref 100–199)
HDL: 70 mg/dL (ref >39)
LDL Calculated: 73 mg/dL (ref 0–99)
Triglycerides: 70 mg/dL (ref 0–149)
VLDL Cholesterol Cal: 14 mg/dL (ref 5–40)

## 2018-05-02 LAB — HEPATIC FUNCTION PANEL
ALT: 30 IU/L (ref 0–32)
AST: 25 IU/L (ref 0–40)
Albumin: 4.3 g/dL (ref 3.5–4.8)
Alkaline Phosphatase: 80 IU/L (ref 39–117)
Bilirubin Total: 0.7 mg/dL (ref 0.0–1.2)
Bilirubin, Direct: 0.18 mg/dL (ref 0.00–0.40)
Total Protein: 6.4 g/dL (ref 6.0–8.5)

## 2018-07-24 ENCOUNTER — Other Ambulatory Visit: Payer: Self-pay | Admitting: Internal Medicine

## 2018-07-26 ENCOUNTER — Telehealth: Payer: Self-pay

## 2018-07-26 NOTE — Telephone Encounter (Signed)
Copied from [place] [id number]. Topic: General - Inquiry >> Jul 26, 2018 12:01 PM Vernona Rieger wrote: Reason for CRM: Patient is calling and would like to speak with Dr Velora Mediate nurse regarding her cholesterol medication. She said she took herself off of it.

## 2018-07-27 NOTE — Telephone Encounter (Signed)
Pt states that she took herself off cholestrol medication because she had a bowel movement every time she went to the bathroom and would have muscle pain. Pt states that she wants to know if she needs an office visigt with you or needs to see someone else about the issues she is having. Please advise  Kerrin Champagne., RMA

## 2018-07-30 NOTE — Telephone Encounter (Signed)
Make OV   To discuss .

## 2018-08-03 NOTE — Telephone Encounter (Signed)
LM x 1 to return call.   NEEDS OV

## 2018-08-09 DIAGNOSIS — R87619 Unspecified abnormal cytological findings in specimens from cervix uteri: Secondary | ICD-10-CM | POA: Insufficient documentation

## 2018-08-21 ENCOUNTER — Encounter: Payer: Self-pay | Admitting: Gastroenterology

## 2018-09-11 ENCOUNTER — Ambulatory Visit: Payer: Medicare Other | Admitting: Gastroenterology

## 2018-09-11 ENCOUNTER — Encounter: Payer: Self-pay | Admitting: Gastroenterology

## 2018-09-11 VITALS — BP 122/74 | HR 77 | Ht 68.0 in | Wt 160.3 lb

## 2018-09-11 DIAGNOSIS — M7918 Myalgia, other site: Secondary | ICD-10-CM | POA: Diagnosis not present

## 2018-09-11 DIAGNOSIS — Z86010 Personal history of colonic polyps: Secondary | ICD-10-CM

## 2018-09-11 DIAGNOSIS — K6289 Other specified diseases of anus and rectum: Secondary | ICD-10-CM

## 2018-09-11 NOTE — Patient Instructions (Addendum)
Thank you for putting your trust in me today.  Please call with any questions or concerns.   If you are age 74 or older, your body mass index should be between 23-30. Your Body mass index is 24.38 kg/m. If this is out of the aforementioned range listed, please consider follow up with your Primary Care Provider.  If you are age 45 or younger, your body mass index should be between 19-25. Your Body mass index is 24.38 kg/m. If this is out of the aformentioned range listed, please consider follow up with your Primary Care Provider.

## 2018-09-11 NOTE — Progress Notes (Signed)
Referring Provider: Burnis Medin, MD Primary Care Physician:  Burnis Medin, MD   Reason for Consultation:  Pain, abnormal liver enzymes   IMPRESSION:  Buttock, groin, rectum and thigh pain of unclear etiology    - normal rectum exam without reproduction of symptoms Concerns for hemorrhoids - without any noted on physical exam or prior colonoscopy reports History of abnormal liver enzymes    -  isolated ALT 37 02/2016 and  ALT 102 03/2016    - all repeat testing has been normal     - ultrasound and MRI 2017 were normal History of colon polyp 2014    - colonoscopy with Dr. Allyn Kenner    - pathology results not known    - no polyps on colonoscopy 2000 Diverticulosis without history of diverticulitis  Buttock, groin, rectum and thigh pain ("wear underwear would be") of unclear etiology. Unlikely distribution that supports a diagnosis of organic GI disease. Bilateral makes radicular pain less likely. I have encouraged her to review these symptoms with Dr. Regis Bill to consider GI etiologies.   I reassured the patient that with normal liver enzymes, normal ultrasound, and normal MRI, and lack of risk factors, she is unlikely to have fatty liver. Given the stability of her liver enzymes since 2017, no specific follow-up recommended at this time. However, I encourage Dr. Regis Bill to rerefer the patient with any concerns about abnormal liver enzymes in the future.   PLAN: Reassurance provided about the abnormal liver enzymes, no hemorrhoids on exam or on prior colonoscopy reports  Colonoscopy sometimes between 2021-2024 Obtain office notes and pathology results from Dr. 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 The history is obtained through the patient and review of her electronic health record. She has previously been under the care of Dr. Allyn Kenner. History of anxiety, arthritis, asthma, breast cancer s/p lumpectomy 1990, diverticulosis,  hypercholesterolemia, and hypothyroidism.  She has a several month history of pain in the area in a distribution "where underwear would be." Hurts in her buttocks, groin, things and genital area when she sits. Pain is bilateral. No radiation.  No pain when standing. Started when doing some intensive exercising on a rower.  She attributes this to problems with her spine. Originally improved when she stopped exercising.  Previously reviewed these symptoms with Dr. Regis Bill.  She is concerned that she may have done some damage to her rectum.  She feels like she may need to see a neurologist or spine specialist.   No change in bowel habits. Normal bowel habits are one formed BM every day. These habits have not changed. No blood or mucous. No other associated symptoms. No identified exacerbating or relieving features. No change in her pain with defecation.   She also asks about her liver enzymes and diagnosis of fatty liver. History of elevated liver enzymes. Elevated several years ago and again last year. Previously evaluated by Dr. Allyn Kenner, most recently in 2018. Told she had fatty liver. Also suggested celiac, but, testing for that was negative. Liver enzymes are now normal again.  Ultrasound and MRI at that time were normal. She specifically has questions about her diagnosis of atty liver.   Review of EPIC shows an isolated ALT 37 02/2016 and  ALT 102 03/2016. They have been normal since that time.   She asks about her diagnosis of a hemorrhoid. No associated symptoms. Unable to detail from where the diagnosis was made.   She reports some  diarrhea in the recent past that she attributed to metformin. The diarrhea has since resolved. No additional problems with diarrhea. GI ROS is otherwise negative.   No family history of colon cancer or polyps.   Prior endoscopy history: Colonoscopy with Dr. Allyn Kenner 2000: diverticulosis. No hemorrhoids mentioned.  Colonoscopy with Dr. Allyn Kenner 2014: small polyp,  diverticulosis. No hemorrhoids mentioned.     Past Medical History:  Diagnosis Date  . Anxiety   . Asthma    evalutated by pulmonary severe by spirometry no smoker currently  . Breast cancer ([place])    hx of right lumpectomy, 1990 IIA radiation cmfp ajunct rx  . Chronic airway obstruction, not elsewhere classified   . Coronary artery calcification seen on CAT scan 01/23/2018  . Diverticulosis    only has like 2  . Dysplasia    hx treated with cerv conization  . Fatty liver    US done 2010  . Fibromyalgia   . Hx of nonmelanoma skin cancer     followed dr [place]   . Hyperlipidemia   . Hypothyroidism   . Malignant neoplasm of breast (female), unspecified site 11/28/2013  . Osteoarthritis   . Skin cancer    squamous cell    Past Surgical History:  Procedure Laterality Date  . BREAST LUMPECTOMY     on right, 1990 IIA radiation cmfp ajuction rx  . KNEE SURGERY  2012  . NASAL SINUS SURGERY  2007   on right done by Dr. Wilburn Cornelia    Current Outpatient Medications  Medication Sig Dispense Refill  . acetaminophen (TYLENOL) 500 MG tablet Take 1,000 mg by mouth every 6 (six) hours as needed. pain     . BREO ELLIPTA 100-25 MCG/INH AEPB USE 1 INHALATION BY MOUTH  DAILY 180 each 1  . cholecalciferol (VITAMIN D) 1000 UNITS tablet Take 1,000 Units by mouth daily.    . diphenhydramine-acetaminophen (TYLENOL PM) 25-500 MG TABS Take 1 tablet by mouth at bedtime as needed. sleep    . MAGNESIUM CITRATE PO Take 1 tablet by mouth daily.    Marland Kitchen PARoxetine (PAXIL) 40 MG tablet Take 1 tablet (40 mg total) by mouth at bedtime. 90 tablet 3  . SYNTHROID 88 MCG tablet Take 1 tablet (88 mcg total) by mouth daily. 90 tablet 3  . atorvastatin (LIPITOR) 40 MG tablet Take 1 tablet (40 mg total) by mouth daily. (Patient not taking: Reported on 09/11/2018) 90 tablet 3  . mirabegron ER (MYRBETRIQ) 25 MG TB24 tablet Take 1 tablet (25 mg total) by mouth daily. May increase to 50 mg per day if needed (Patient not  taking: Reported on 09/11/2018) 30 tablet 1  . valACYclovir (VALTREX) 1000 MG tablet TAKE 2 TABLETS BY MOUTH TWICE DAILY AS DIRECTED (Patient not taking: Reported on 09/11/2018) 90 tablet prn  . vitamin C (ASCORBIC ACID) 500 MG tablet Take 500 mg by mouth daily.     No current facility-administered medications for this visit.     Allergies as of 09/11/2018 - Review Complete 09/11/2018  Allergen Reaction Noted  . Hydrocodone Itching 11/08/2010  . Hydrocodone-acetaminophen Other (See Comments) 02/01/2016    Family History  Problem Relation Age of Onset  . Other Father        mysthenia gravis  . Lung cancer Sister   . Osteoporosis Other   . Thyroid disease Other   . Breast cancer Mother 34       possible uterine cancer diagnosis at age 31  . Osteoporosis Mother  no hip fracture  . Breast cancer Sister 23  . Lung cancer Sister 20       former smoker  . Lung cancer Paternal Grandmother   . Colon cancer Neg Hx   . Esophageal cancer Neg Hx     Social History   Socioeconomic History  . Marital status: Widowed    Spouse name: Not on file  . Number of children: 1  . Years of education: Not on file  . Highest education level: Not on file  Occupational History  . Occupation: Retired Tour manager  . Financial resource strain: Not on file  . Food insecurity:    Worry: Not on file    Inability: Not on file  . Transportation needs:    Medical: Not on file    Non-medical: Not on file  Tobacco Use  . Smoking status: Former Smoker    Packs/day: 0.80    Years: 10.00    Pack years: 8.00    Types: Cigarettes    Last attempt to quit: 07/11/1974    Years since quitting: 44.2  . Smokeless tobacco: Never Used  Substance and Sexual Activity  . Alcohol use: Yes    Alcohol/week: 7.0 standard drinks    Types: 7 Standard drinks or equivalent per week    Comment: 1 glass of red wine nightly and socially  . Drug use: No  . Sexual activity: Yes    Partners: Male  Lifestyle    . Physical activity:    Days per week: Not on file    Minutes per session: Not on file  . Stress: Not on file  Relationships  . Social connections:    Talks on phone: Not on file    Gets together: Not on file    Attends religious service: Not on file    Active member of club or organization: Not on file    Attends meetings of clubs or organizations: Not on file    Relationship status: Not on file  . Intimate partner violence:    Fear of current or ex partner: Not on file    Emotionally abused: Not on file    Physically abused: Not on file    Forced sexual activity: Not on file  Other Topics Concern  . Not on file  Social History Narrative   Retired Pharmacist, hospital    hh of 1   Now has puppy    Quit smoking 30 + years ago. 20 year pack year smoking hx   Regular exercise- some  treadmill    8 hours sleep         Caffeine use: 1-2 cups of coffee/[employer] instant   Husband dx glioblastoma fall 14   Passed away fall 10/27/2014    Review of Systems: 12 system ROS is negative except as noted above with the additions of arthritis, back pain, and shortness of breath on exertion.  Filed Weights   09/11/18 1332  Weight: 160 lb 5 oz (72.7 kg)    Physical Exam: Vital signs were reviewed. General:   Alert, well-nourished, pleasant and cooperative in NAD Head:  Normocephalic and atraumatic. Eyes:  Sclera clear, no icterus.   Conjunctiva pink. Mouth:  No deformity or lesions.   Neck:  Supple; no thyromegaly. Lungs:  Clear throughout to auscultation.   No wheezes.  Heart:  Regular rate and rhythm; no murmurs Abdomen:  Soft, nontender, normal bowel sounds. No rebound or guarding. No hepatosplenomegaly Rectal:  No chemical dermatitis. No external  hemorrhoids, fissure or fistula. No prolapsing hemorrhoids. No rectal prolapse. Normal anocutaneous reflex. No stool in the rectal vault. No mass or fecal impaction. Normal anal resting tone. Chaperone: Brooke Msk:  Symmetrical without gross  deformities. Extremities:  No gross deformities or edema. Neurologic:  Alert and  oriented x4;  grossly nonfocal Skin:  No rash or bruise. Psych:  Alert and cooperative. Normal mood and affect.   Bernadene Garside L. Tarri Glenn, MD, MPH [facility] 09/22/2018, 9:50 PM

## 2018-09-12 ENCOUNTER — Telehealth: Payer: Self-pay | Admitting: Gastroenterology

## 2018-09-12 NOTE — Telephone Encounter (Signed)
Spoke to nurse Gustavus Messing at Dr. [facility] office - informed that pt's name at [facility] is documented as Jasmine Neal.    Called pt to notify that she will need to update her legal name with [facility] before they will be willing to transfer records.

## 2018-09-15 ENCOUNTER — Encounter: Payer: Self-pay | Admitting: Gastroenterology

## 2018-09-22 ENCOUNTER — Encounter: Payer: Self-pay | Admitting: Gastroenterology

## 2018-12-18 ENCOUNTER — Ambulatory Visit: Payer: Medicare Other

## 2019-01-07 ENCOUNTER — Telehealth: Payer: Self-pay | Admitting: Internal Medicine

## 2019-01-07 ENCOUNTER — Telehealth: Payer: Self-pay | Admitting: Gastroenterology

## 2019-01-07 DIAGNOSIS — R911 Solitary pulmonary nodule: Secondary | ICD-10-CM

## 2019-01-07 NOTE — Telephone Encounter (Signed)
The recommendations are dependent on the records from Dr. Allyn Kenner - which I still don't have to review. Please ask Desiree to try to get them. Thanks.

## 2019-01-07 NOTE — Telephone Encounter (Signed)
New order has been placed.  Earnest Bailey is calling [facility] to get patient scheduled.  Nothing further needed.

## 2019-01-07 NOTE — Telephone Encounter (Signed)
Instructions  Moderate persistent asthma without complication  - stable and well controlled - continue breo as before - flu shot in fall - Please talk to PCP Panosh, Standley Brooking, MD -  and ensure you get  shingrix vaccine (more effective than zoster you got in 2014) - do spirometry and dlco at next visit in 9 months  Multiple lung nodules on CT  - largest in 75mmm Right upper lobe -stable march -> july 2019 - repeat ct chest without contrast in 9 months  Followup 9 months but after CT chest and lung function test     I have placed order for CT scan to be done.

## 2019-01-07 NOTE — Telephone Encounter (Signed)
Pt has called Erline Levine at [facility] and said she wants to schedule her CT.  Erline Levine couldn't find an order and I don't see one on this end either.  Should order be placed for pt to have a CT?

## 2019-01-07 NOTE — Telephone Encounter (Signed)
Spoke to the patient who wanted to know when she should have another colonoscopy. Per Dr. Tarri Glenn note on 09/11/18, the range that is given is between 2021-2024. The patient wanted to narrow down the time frame. This RN told the patient at the earliest, a colon would be scheduled in the year 2021. The patient wanted to confirm this with Dr. Tarri Glenn. Please advise.

## 2019-01-10 ENCOUNTER — Other Ambulatory Visit: Payer: Self-pay | Admitting: Internal Medicine

## 2019-01-16 NOTE — Telephone Encounter (Signed)
Records request faxed to Dr. Liliane Channel office

## 2019-01-29 ENCOUNTER — Telehealth: Payer: Self-pay | Admitting: *Deleted

## 2019-01-29 NOTE — Telephone Encounter (Signed)

## 2019-01-30 ENCOUNTER — Other Ambulatory Visit: Payer: Self-pay

## 2019-01-30 ENCOUNTER — Ambulatory Visit (INDEPENDENT_AMBULATORY_CARE_PROVIDER_SITE_OTHER)
Admission: RE | Admit: 2019-01-30 | Discharge: 2019-01-30 | Disposition: A | Discharge status: Home / Self Care | Payer: Medicare Other | Source: Ambulatory Visit | Attending: Internal Medicine | Admitting: Internal Medicine

## 2019-01-30 DIAGNOSIS — R911 Solitary pulmonary nodule: Secondary | ICD-10-CM | POA: Diagnosis not present

## 2019-03-11 NOTE — Patient Instructions (Addendum)
Preventive Care 86 Years and Older, Female  Preventive care refers to lifestyle choices and visits with your health care provider that can promote health and wellness. This includes:  A yearly physical exam. This is also called an annual well check.  Regular dental and eye exams.  Immunizations.  Screening for certain conditions.  Healthy lifestyle choices, such as diet and exercise.  What can I expect for my preventive care visit?  Physical exam  Your health care provider will check:  Height and weight. These may be used to calculate body mass index (BMI), which is a measurement that tells if you are at a healthy weight.  Heart rate and blood pressure.  Your skin for abnormal spots.  Counseling  Your health care provider may ask you questions about:  Alcohol, tobacco, and drug use.  Emotional well-being.  Home and relationship well-being.  Sexual activity.  Eating habits.  History of falls.  Memory and ability to understand (cognition).  Work and work Statistician.  Pregnancy and menstrual history.  What immunizations do I need?    Influenza (flu) vaccine  This is recommended every year.  Tetanus, diphtheria, and pertussis (Tdap) vaccine  You may need a Td booster every 10 years.  Varicella (chickenpox) vaccine  You may need this vaccine if you have not already been vaccinated.  Zoster (shingles) vaccine  You may need this after age 74.  Pneumococcal conjugate (PCV13) vaccine  One dose is recommended after age 75. Pneumococcal polysaccharide (PPSV23) vaccine  One dose is recommended after age 60. Measles, mumps, and rubella (MMR) vaccine  You may need at least one dose of MMR if you were born in 1957 or later. You may also need a second dose. Meningococcal conjugate (MenACWY) vaccine  You may need this if you have certain conditions. Hepatitis A vaccine  You may need this if you have certain conditions or if you travel or  work in places where you may be exposed to hepatitis A. Hepatitis B vaccine  You may need this if you have certain conditions or if you travel or work in places where you may be exposed to hepatitis B. Haemophilus influenzae type b (Hib) vaccine  You may need this if you have certain conditions. You may receive vaccines as individual doses or as more than one vaccine together in one shot (combination vaccines). Talk with your health care provider about the risks and benefits of combination vaccines. What tests do I need? Blood tests  Lipid and cholesterol levels. These may be checked every 5 years, or more frequently depending on your overall health.  Hepatitis C test.  Hepatitis B test. Screening  Lung cancer screening. You may have this screening every year starting at age 24 if you have a 30-pack-year history of smoking and currently smoke or have quit within the past 15 years.  Colorectal cancer screening. All adults should have this screening starting at age 23 and continuing until age 27. Your health care provider may recommend screening at age 58 if you are at increased risk. You will have tests every 1-10 years, depending on your results and the type of screening test.  Diabetes screening. This is done by checking your blood sugar (glucose) after you have not eaten for a while (fasting). You may have this done every 1-3 years.  Mammogram. This may be done every 1-2 years. Talk with your health care provider about how often you should have regular mammograms.  BRCA-related cancer screening. This may be done if you have a family history of breast, ovarian, tubal, or peritoneal cancers. Other tests  Sexually transmitted disease (STD) testing.  Bone density scan. This is done to screen for osteoporosis. You may have this done starting at age 59. Follow these instructions at home: Eating and drinking  Eat a diet that includes fresh fruits and vegetables, whole grains, lean  protein, and low-fat dairy products. Limit your intake of foods with high amounts of sugar, saturated fats, and salt.  Take vitamin and mineral supplements as recommended by your health care provider.  Do not drink alcohol if your health care provider tells you not to drink.  If you drink alcohol: ? Limit how much you have to 0-1 drink a day. ? Be aware of how much alcohol is in your drink. In the [place] one drink equals one 12 oz bottle of beer (355 mL), one 5 oz glass of wine (148 mL), or one 1 oz glass of hard liquor (44 mL). Lifestyle  Take daily care of your teeth and gums.  Stay active. Exercise for at least 30 minutes on 5 or more days each week.  Do not use any products that contain nicotine or tobacco, such as cigarettes, e-cigarettes, and chewing tobacco. If you need help quitting, ask your health care provider.  If you are sexually active, practice safe sex. Use a condom or other form of protection in order to prevent STIs (sexually transmitted infections).  Talk with your health care provider about taking a low-dose aspirin or statin. What's next?  Go to your health care provider once a year for a well check visit.  Ask your health care provider how often you should have your eyes and teeth checked.  Stay up to date on all vaccines. This information is not intended to replace advice given to you by your health care provider. Make sure you discuss any questions you have with your health care provider. Document Released: 07/24/2015 Document Revised: 06/21/2018 Document Reviewed: 06/21/2018 Elsevier Patient Education  [employer].   Try  20 mg atorva to see if still get diarrhea  If tolerated get lipid panelk in 3 months    If side effect let us know and will let dr Champ Mungo know and helpd decide on follow up.   Lab Results  Component Value Date   CHOL 157 05/02/2018   CHOL 185 03/07/2018   CHOL 168 02/27/2017   Lab Results  Component Value Date   HDL 70  05/02/2018   HDL 58.50 03/07/2018   HDL 52.90 02/27/2017

## 2019-03-11 NOTE — Progress Notes (Signed)
Chief Complaint  Patient presents with  . Annual Exam    Pt wants to discuss test results and medications  . Medication Management  . Hyperlipidemia  . Hypothyroidism    HPI: Jasmine Neal 73 y.o. comes in today for Preventive Medicare exam/ wellness visit .and   A list of ?s to address    GI  Dr Laurier Nancy   saw her and felt that she did not have a fatty liver but that her new doctor felt her flat polyps may warrant a little closer: Follow-up.  PAP  Done  At [facility]   Over due for mammo plans on getting this    flate polyps     lvier ok no fatty liver     1. Pulm following nodules  consideration for lung cancer screening   Needs   Second shingles vaccine late.   Left nostril  Sore .   Some blood in am  Irritated  No blockage or nosebleed per se  3 mos.     hld   :  cac score 33%ile atorva statin  70 days felt to have diarrhea when she was on it at 40 mg she stopped it temporarily and her diarrhea went away.  This medication was given to her by Dr. Radford Pax in cardiology.  Has questions about cholesterol goal and medication.  Refill thryoid med taking daily   paroxetine doing well     Last dexa 2 years ago   Health Maintenance  Topic Date Due  . MAMMOGRAM  08/23/2018  . COLONOSCOPY  05/14/2023  . TETANUS/TDAP  02/28/2027  . INFLUENZA VACCINE  Completed  . DEXA SCAN  Completed  . Hepatitis C Screening  Completed  . PNA vac Low Risk Adult  Completed   Health Maintenance Review LIFESTYLE:  Exercise:   Treadmills and  Dog walking .  Tobacco/ETS: no Alcohol:   More since covid now 2-3  Sugar beverages: ocass Sleep: ave [address]  Drug use: no HH: pet dog    Hearing:   Not bad   Vision:  No limitations at present . Last eye check UTD ok   Safety:  Has smoke detector and wears seat belts.  . No excess sun exposure. Sees dentist regularly.  Falls:  no  Memory: Felt to be good  , no concern from her or her family. friedns   Depression: No anhedonia  unusual crying or depressive symptoms  Nutrition: Eats well balanced diet; adequate calcium and vitamin D. No swallowing chewing problems.  Injury: no major injuries in the last six months.  Other healthcare providers:  Reviewed today .   Preventive parameters: up-to-date  Reviewed   ADLS:   There are no problems or need for assistance  driving, feeding, obtaining food, dressing, toileting and bathing, managing money using phone. She is independent.    ROS:  GEN/ HEENT: No fever, significant weight changes sweats headaches vision problems hearing changes, CV/ PULM; No chest pain shortness of breath cough, syncope,edema  change in exercise tolerance. GI /GU: No adominal pain, vomiting, . No blood in the stool. No significant GU symptoms. SKIN/HEME: ,no acute skin rashes suspicious lesions or bleeding. No lymphadenopathy, nodules, masses.  NEURO/ PSYCH:  No neurologic signs such as weakness numbness. No depression anxiety. IMM/ Allergy: No unusual infections.  Allergy .   REST of 12 system review negative except as per HPI   Past Medical History:  Diagnosis Date  . Anxiety   . Asthma  evalutated by pulmonary severe by spirometry no smoker currently  . Breast cancer ([place])    hx of right lumpectomy, 1990 IIA radiation cmfp ajunct rx  . Chronic airway obstruction, not elsewhere classified   . Coronary artery calcification seen on CAT scan 01/23/2018  . Diverticulosis    only has like 2  . Dysplasia    hx treated with cerv conization  . Fatty liver    US done 2010  . Fibromyalgia   . Hx of nonmelanoma skin cancer     followed dr [place]   . Hyperlipidemia   . Hypothyroidism   . Malignant neoplasm of breast (female), unspecified site 11/28/2013  . Osteoarthritis   . Skin cancer    squamous cell    Family History  Problem Relation Age of Onset  . Other Father        mysthenia gravis  . Lung cancer Sister   . Osteoporosis Other   . Thyroid disease Other   . Breast  cancer Mother 59       possible uterine cancer diagnosis at age 53  . Osteoporosis Mother        no hip fracture  . Breast cancer Sister 66  . Lung cancer Sister 31       former smoker  . Lung cancer Paternal Grandmother   . Colon cancer Neg Hx   . Esophageal cancer Neg Hx     Social History   Socioeconomic History  . Marital status: Widowed    Spouse name: Not on file  . Number of children: 1  . Years of education: Not on file  . Highest education level: Not on file  Occupational History  . Occupation: Retired Tour manager  . Financial resource strain: Not on file  . Food insecurity    Worry: Not on file    Inability: Not on file  . Transportation needs    Medical: Not on file    Non-medical: Not on file  Tobacco Use  . Smoking status: Former Smoker    Packs/day: 0.80    Years: 10.00    Pack years: 8.00    Types: Cigarettes    Quit date: 07/11/1974    Years since quitting: 44.7  . Smokeless tobacco: Never Used  Substance and Sexual Activity  . Alcohol use: Yes    Alcohol/week: 7.0 standard drinks    Types: 7 Standard drinks or equivalent per week    Comment: 1 glass of red wine nightly and socially  . Drug use: No  . Sexual activity: Yes    Partners: Male  Lifestyle  . Physical activity    Days per week: Not on file    Minutes per session: Not on file  . Stress: Not on file  Relationships  . Social Herbalist on phone: Not on file    Gets together: Not on file    Attends religious service: Not on file    Active member of club or organization: Not on file    Attends meetings of clubs or organizations: Not on file    Relationship status: Not on file  Other Topics Concern  . Not on file  Social History Narrative   Retired Pharmacist, hospital    hh of 1   Now has puppy    Quit smoking 30 + years ago. 20 year pack year smoking hx   Regular exercise- some  treadmill    8 hours sleep  Caffeine use: 1-2 cups of coffee/[employer] instant    Husband dx glioblastoma fall 15   Passed away fall 09-23-14    Outpatient Encounter Medications as of 03/12/2019  Medication Sig  . acetaminophen (TYLENOL) 500 MG tablet Take 1,000 mg by mouth every 6 (six) hours as needed. pain   . BREO ELLIPTA 100-25 MCG/INH AEPB USE 1 INHALATION BY MOUTH  DAILY  . cholecalciferol (VITAMIN D) 1000 UNITS tablet Take 1,000 Units by mouth daily.  . diphenhydramine-acetaminophen (TYLENOL PM) 25-500 MG TABS Take 1 tablet by mouth at bedtime as needed. sleep  . MAGNESIUM CITRATE PO Take 1 tablet by mouth daily.  Marland Kitchen PARoxetine (PAXIL) 40 MG tablet Take 1 tablet (40 mg total) by mouth at bedtime.  Marland Kitchen SYNTHROID 88 MCG tablet Take 1 tablet (88 mcg total) by mouth daily.  . vitamin C (ASCORBIC ACID) 500 MG tablet Take 500 mg by mouth daily.  . [DISCONTINUED] SYNTHROID 88 MCG tablet TAKE 1 TABLET BY MOUTH  DAILY  . atorvastatin (LIPITOR) 40 MG tablet Take 1 tablet (40 mg total) by mouth daily. (Patient not taking: Reported on 09/11/2018)  . mirabegron ER (MYRBETRIQ) 25 MG TB24 tablet Take 1 tablet (25 mg total) by mouth daily. May increase to 50 mg per day if needed (Patient not taking: Reported on 09/11/2018)  . valACYclovir (VALTREX) 1000 MG tablet TAKE 2 TABLETS BY MOUTH TWICE DAILY AS DIRECTED  . [DISCONTINUED] valACYclovir (VALTREX) 1000 MG tablet TAKE 2 TABLETS BY MOUTH TWICE DAILY AS DIRECTED (Patient not taking: Reported on 09/11/2018)   No facility-administered encounter medications on file as of 03/12/2019.     EXAM:  BP 120/78 (BP Location: Right Arm, Patient Position: Sitting, Cuff Size: Normal)   Pulse 73   Temp 97.6 F (36.4 C) (Temporal)   Ht '5\' 8"'$  (1.727 m)   Wt 162 lb 3.2 oz (73.6 kg)   SpO2 94%   BMI 24.66 kg/m   Body mass index is 24.66 kg/m.  Physical Exam: Vital signs reviewed JME:QAST is a well-developed well-nourished alert cooperative   who appears stated age in no acute distress.  HEENT: normocephalic atraumatic , Eyes: PERRL EOM's full,  conjunctiva clear, Nares: paten,t no deformity discharge or tenderness. Mild erythema left nostril no dc , Ears: no deformity EAC's clear TMs with normal landmarks. Mouth: clear OP, masked   NECK: supple without masses, thyromegaly or bruits. CHEST/PULM:  Clear to auscultation and percussion breath sounds equal no wheeze , rales or rhonchi. No chest wall deformities or tenderness.breast  Rad changes right left no masses  CV: PMI is nondisplaced, S1 S2 no gallops, murmurs, rubs. Peripheral pulses are full without delay.No JVD .  ABDOMEN: Bowel sounds normal nontender  No guard or rebound, no hepato splenomegal no CVA tenderness.   Extremtities:  No clubbing cyanosis or edema, no acute joint swelling or redness no focal atrophy NEURO:  Oriented x3, cranial nerves 3-12 appear to be intact, no obvious focal weakness,gait within normal limits no abnormal reflexes or asymmetrical SKIN: No acute rashes normal turgor, color, no bruising or petechiae. PSYCH: Oriented, good eye contact, no obvious depression anxiety, cognition and judgment appear normal. LN: no cervical axillary inguinal adenopathy No noted deficits in memory, attention, and speech.   Lab Results  Component Value Date   WBC 3.9 (L) 03/12/2019   HGB 14.1 03/12/2019   HCT 42.5 03/12/2019   PLT 155.0 03/12/2019   GLUCOSE 85 03/12/2019   CHOL 205 (H) 03/12/2019   TRIG 89.0 03/12/2019  HDL 68.30 03/12/2019   LDLDIRECT 137.0 09/11/2012   LDLCALC 119 (H) 03/12/2019   ALT 46 (H) 03/12/2019   AST 42 (H) 03/12/2019   NA 140 03/12/2019   K 4.4 03/12/2019   CL 104 03/12/2019   CREATININE 0.81 03/12/2019   BUN 18 03/12/2019   CO2 30 03/12/2019   TSH 1.29 03/12/2019   HGBA1C 5.7 03/12/2019  Results:  Lumbar spine L1-L4 Femoral neck (FN)  T-score -1.0 RFN: -1.3 LFN: -0.6  Change in BMD from previous DXA test (%) -5.0%* -3.8%  (*) statistically significant   ASSESSMENT AND PLAN:  Discussed the following assessment and plan:   Visit for preventive health examination  Medication management - paxil cont disc weaning lower dose if wishes  - Plan: Basic metabolic panel, CBC with Differential/Platelet, Hepatic function panel, Lipid panel, TSH, Hemoglobin A1c, Lipid panel, Hepatic function panel  Hypothyroidism, unspecified type - refill med - Plan: Basic metabolic panel, CBC with Differential/Platelet, Hepatic function panel, Lipid panel, TSH, Hemoglobin A1c  Need for influenza vaccination - Plan: Flu Vaccine QUAD High Dose(Fluad)  Pure hypercholesterolemia - Plan: Basic metabolic panel, CBC with Differential/Platelet, Hepatic function panel, Lipid panel, TSH, Hemoglobin A1c, Lipid panel, Hepatic function panel  History of breast cancer - Plan: Basic metabolic panel, CBC with Differential/Platelet, Hepatic function panel, Lipid panel, TSH, Hemoglobin A1c  Coronary artery calcification seen on CAT scan - Plan: Basic metabolic panel, CBC with Differential/Platelet, Hepatic function panel, Lipid panel, TSH, Hemoglobin A1c  Medication side effect - reported with atorva 40 diarrhea so stopped  - Plan: Basic metabolic panel, CBC with Differential/Platelet, Hepatic function panel, Lipid panel, TSH, Hemoglobin A1c  Abnormal LFTs - Plan: Lipid panel, Hepatic function panel   Saline  toleft nostril.  Disc   about labs  Try 1/2 dose atorva and if causes diarrhea then stop and let us know  Disc goals prevention  Risk assessments  dexa next year.   Get mammo   Will send message to dr turner about the atorva and se noted   Patient Care Team: , Standley Brooking, MD as PCP - General Jerrell Belfast, MD [place], Amy, MD as Consulting Physician (Dermatology) Neldon Mc, Donnamarie Poag, MD as Consulting Physician (Allergy and Immunology) Marygrace Drought, MD as Consulting Physician (Ophthalmology) Clent Jacks, MD as Consulting Physician (Ophthalmology) Neldon Mc Donnamarie Poag, MD as Consulting Physician (Allergy and Immunology) Brand Males, MD  as Consulting Physician (Pulmonary Disease) Thornton Park, MD as Consulting Physician (Gastroenterology)  Patient Instructions          Preventive Care 65 Years and Older, Female  Preventive care refers to lifestyle choices and visits with your health care provider that can promote health and wellness. This includes:  A yearly physical exam. This is also called an annual well check.  Regular dental and eye exams.  Immunizations.  Screening for certain conditions.  Healthy lifestyle choices, such as diet and exercise.  What can I expect for my preventive care visit?  Physical exam  Your health care provider will check:  Height and weight. These may be used to calculate body mass index (BMI), which is a measurement that tells if you are at a healthy weight.  Heart rate and blood pressure.  Your skin for abnormal spots.  Counseling  Your health care provider may ask you questions about:  Alcohol, tobacco, and drug use.  Emotional well-being.  Home and relationship well-being.  Sexual activity.  Eating habits.  History of falls.  Memory and ability to understand (cognition).  Work and work Statistician.  Pregnancy and menstrual history.  What immunizations do I need?    Influenza (flu) vaccine  This is recommended every year.  Tetanus, diphtheria, and pertussis (Tdap) vaccine  You may need a Td booster every 10 years.  Varicella (chickenpox) vaccine  You may need this vaccine if you have not already been vaccinated.  Zoster (shingles) vaccine  You may need this after age 83.  Pneumococcal conjugate (PCV13) vaccine  One dose is recommended after age 89. Pneumococcal polysaccharide (PPSV23) vaccine  One dose is recommended after age 93. Measles, mumps, and rubella (MMR) vaccine  You may need at least one dose of MMR if you were born in 1957 or later. You may also need a second dose. Meningococcal conjugate (MenACWY) vaccine  You  may need this if you have certain conditions. Hepatitis A vaccine  You may need this if you have certain conditions or if you travel or work in places where you may be exposed to hepatitis A. Hepatitis B vaccine  You may need this if you have certain conditions or if you travel or work in places where you may be exposed to hepatitis B. Haemophilus influenzae type b (Hib) vaccine  You may need this if you have certain conditions. You may receive vaccines as individual doses or as more than one vaccine together in one shot (combination vaccines). Talk with your health care provider about the risks and benefits of combination vaccines. What tests do I need? Blood tests  Lipid and cholesterol levels. These may be checked every 5 years, or more frequently depending on your overall health.  Hepatitis C test.  Hepatitis B test. Screening  Lung cancer screening. You may have this screening every year starting at age 17 if you have a 30-pack-year history of smoking and currently smoke or have quit within the past 15 years.  Colorectal cancer screening. All adults should have this screening starting at age 36 and continuing until age 8. Your health care provider may recommend screening at age 46 if you are at increased risk. You will have tests every 1-10 years, depending on your results and the type of screening test.  Diabetes screening. This is done by checking your blood sugar (glucose) after you have not eaten for a while (fasting). You may have this done every 1-3 years.  Mammogram. This may be done every 1-2 years. Talk with your health care provider about how often you should have regular mammograms.  BRCA-related cancer screening. This may be done if you have a family history of breast, ovarian, tubal, or peritoneal cancers. Other tests  Sexually transmitted disease (STD) testing.  Bone density scan. This is done to screen for osteoporosis. You may have this done starting at age 61.  Follow these instructions at home: Eating and drinking  Eat a diet that includes fresh fruits and vegetables, whole grains, lean protein, and low-fat dairy products. Limit your intake of foods with high amounts of sugar, saturated fats, and salt.  Take vitamin and mineral supplements as recommended by your health care provider.  Do not drink alcohol if your health care provider tells you not to drink.  If you drink alcohol: ? Limit how much you have to 0-1 drink a day. ? Be aware of how much alcohol is in your drink. In the [place] one drink equals one 12 oz bottle of beer (355 mL), one 5 oz glass of wine (148 mL), or one 1 oz glass of hard liquor (  44 mL). Lifestyle  Take daily care of your teeth and gums.  Stay active. Exercise for at least 30 minutes on 5 or more days each week.  Do not use any products that contain nicotine or tobacco, such as cigarettes, e-cigarettes, and chewing tobacco. If you need help quitting, ask your health care provider.  If you are sexually active, practice safe sex. Use a condom or other form of protection in order to prevent STIs (sexually transmitted infections).  Talk with your health care provider about taking a low-dose aspirin or statin. What's next?  Go to your health care provider once a year for a well check visit.  Ask your health care provider how often you should have your eyes and teeth checked.  Stay up to date on all vaccines. This information is not intended to replace advice given to you by your health care provider. Make sure you discuss any questions you have with your health care provider. Document Released: 07/24/2015 Document Revised: 06/21/2018 Document Reviewed: 06/21/2018 Elsevier Patient Education  [place].   Try  20 mg atorva to see if still get diarrhea  If tolerated get lipid panelk in 3 months    If side effect let us know and will let dr Champ Mungo know and helpd decide on follow up.   Lab Results  Component  Value Date   CHOL 157 05/02/2018   CHOL 185 03/07/2018   CHOL 168 02/27/2017   Lab Results  Component Value Date   HDL 70 05/02/2018   HDL 58.50 03/07/2018   HDL 52.90 02/27/2017       K.  M.D.

## 2019-03-12 ENCOUNTER — Ambulatory Visit (INDEPENDENT_AMBULATORY_CARE_PROVIDER_SITE_OTHER): Payer: Medicare Other | Admitting: Internal Medicine

## 2019-03-12 ENCOUNTER — Other Ambulatory Visit: Payer: Self-pay

## 2019-03-12 ENCOUNTER — Encounter: Payer: Self-pay | Admitting: Internal Medicine

## 2019-03-12 VITALS — BP 120/78 | HR 73 | Temp 97.6°F | Ht 68.0 in | Wt 162.2 lb

## 2019-03-12 DIAGNOSIS — T887XXA Unspecified adverse effect of drug or medicament, initial encounter: Secondary | ICD-10-CM

## 2019-03-12 DIAGNOSIS — Z0000 Encounter for general adult medical examination without abnormal findings: Secondary | ICD-10-CM | POA: Diagnosis not present

## 2019-03-12 DIAGNOSIS — Z853 Personal history of malignant neoplasm of breast: Secondary | ICD-10-CM

## 2019-03-12 DIAGNOSIS — R945 Abnormal results of liver function studies: Secondary | ICD-10-CM

## 2019-03-12 DIAGNOSIS — E039 Hypothyroidism, unspecified: Secondary | ICD-10-CM | POA: Diagnosis not present

## 2019-03-12 DIAGNOSIS — Z79899 Other long term (current) drug therapy: Secondary | ICD-10-CM

## 2019-03-12 DIAGNOSIS — E7800 Pure hypercholesterolemia, unspecified: Secondary | ICD-10-CM

## 2019-03-12 DIAGNOSIS — Z23 Encounter for immunization: Secondary | ICD-10-CM

## 2019-03-12 DIAGNOSIS — R7989 Other specified abnormal findings of blood chemistry: Secondary | ICD-10-CM

## 2019-03-12 DIAGNOSIS — I2510 Atherosclerotic heart disease of native coronary artery without angina pectoris: Secondary | ICD-10-CM

## 2019-03-12 LAB — HEPATIC FUNCTION PANEL
ALT: 46 U/L — ABNORMAL HIGH (ref 0–35)
AST: 42 U/L — ABNORMAL HIGH (ref 0–37)
Albumin: 4.3 g/dL (ref 3.5–5.2)
Alkaline Phosphatase: 96 U/L (ref 39–117)
Bilirubin, Direct: 0.1 mg/dL (ref 0.0–0.3)
Total Bilirubin: 0.5 mg/dL (ref 0.2–1.2)
Total Protein: 6.8 g/dL (ref 6.0–8.3)

## 2019-03-12 LAB — BASIC METABOLIC PANEL
BUN: 18 mg/dL (ref 6–23)
CO2: 30 mEq/L (ref 19–32)
Calcium: 9.8 mg/dL (ref 8.4–10.5)
Chloride: 104 mEq/L (ref 96–112)
Creatinine, Ser: 0.81 mg/dL (ref 0.40–1.20)
GFR: 69.06 mL/min (ref >60.00)
Glucose, Bld: 85 mg/dL (ref 70–99)
Potassium: 4.4 mEq/L (ref 3.5–5.1)
Sodium: 140 mEq/L (ref 135–145)

## 2019-03-12 LAB — CBC WITH DIFFERENTIAL/PLATELET
Basophils Absolute: 0 10*3/uL (ref 0.0–0.1)
Basophils Relative: 0.8 % (ref 0.0–3.0)
Eosinophils Absolute: 0.1 10*3/uL (ref 0.0–0.7)
Eosinophils Relative: 3.2 % (ref 0.0–5.0)
HCT: 42.5 % (ref 36.0–46.0)
Hemoglobin: 14.1 g/dL (ref 12.0–15.0)
Lymphocytes Relative: 28.2 % (ref 12.0–46.0)
Lymphs Abs: 1.1 10*3/uL (ref 0.7–4.0)
MCHC: 33 g/dL (ref 30.0–36.0)
MCV: 93.4 fl (ref 78.0–100.0)
Monocytes Absolute: 0.4 10*3/uL (ref 0.1–1.0)
Monocytes Relative: 9.4 % (ref 3.0–12.0)
Neutro Abs: 2.3 10*3/uL (ref 1.4–7.7)
Neutrophils Relative %: 58.4 % (ref 43.0–77.0)
Platelets: 155 10*3/uL (ref 150.0–400.0)
RBC: 4.56 Mil/uL (ref 3.87–5.11)
RDW: 13.6 % (ref 11.5–15.5)
WBC: 3.9 10*3/uL — ABNORMAL LOW (ref 4.0–10.5)

## 2019-03-12 LAB — LIPID PANEL
Cholesterol: 205 mg/dL — ABNORMAL HIGH (ref 0–200)
HDL: 68.3 mg/dL (ref >39.00)
LDL Cholesterol: 119 mg/dL — ABNORMAL HIGH (ref 0–99)
NonHDL: 136.4
Total CHOL/HDL Ratio: 3
Triglycerides: 89 mg/dL (ref 0.0–149.0)
VLDL: 17.8 mg/dL (ref 0.0–40.0)

## 2019-03-12 LAB — HEMOGLOBIN A1C: Hgb A1c MFr Bld: 5.7 % (ref 4.6–6.5)

## 2019-03-12 LAB — TSH: TSH: 1.29 u[IU]/mL (ref 0.35–4.50)

## 2019-03-12 MED ORDER — SYNTHROID 88 MCG PO TABS: 88.0000 ug | ORAL_TABLET | Freq: Every day | ORAL | 3 refills | Status: DC

## 2019-03-12 MED ORDER — VALACYCLOVIR HCL 1 G PO TABS: ORAL_TABLET | ORAL | 99 refills | Status: AC

## 2019-03-14 ENCOUNTER — Other Ambulatory Visit: Payer: Self-pay | Admitting: Internal Medicine

## 2019-03-14 DIAGNOSIS — Z1231 Encounter for screening mammogram for malignant neoplasm of breast: Secondary | ICD-10-CM

## 2019-03-15 ENCOUNTER — Encounter: Payer: Self-pay | Admitting: Internal Medicine

## 2019-03-15 ENCOUNTER — Encounter: Payer: Self-pay | Admitting: *Deleted

## 2019-03-15 ENCOUNTER — Telehealth: Payer: Self-pay | Admitting: *Deleted

## 2019-03-15 NOTE — Telephone Encounter (Signed)
Patient schedule for f/u to discuss liver labs per Dr. Tarri Glenn on 10/2 at 9:50 am. Letter sent.

## 2019-03-17 ENCOUNTER — Other Ambulatory Visit: Payer: Self-pay | Admitting: Internal Medicine

## 2019-03-21 ENCOUNTER — Other Ambulatory Visit: Payer: Self-pay | Admitting: Internal Medicine

## 2019-03-22 ENCOUNTER — Other Ambulatory Visit (HOSPITAL_COMMUNITY): Admission: RE | Admit: 2019-03-22 | Payer: Medicare Other | Source: Ambulatory Visit

## 2019-03-22 ENCOUNTER — Other Ambulatory Visit: Payer: Self-pay | Admitting: *Deleted

## 2019-03-22 DIAGNOSIS — Z20822 Contact with and (suspected) exposure to covid-19: Secondary | ICD-10-CM

## 2019-03-23 LAB — NOVEL CORONAVIRUS, NAA: SARS-CoV-2, NAA: NOT DETECTED

## 2019-03-25 ENCOUNTER — Other Ambulatory Visit: Payer: Self-pay | Admitting: *Deleted

## 2019-03-25 DIAGNOSIS — J4540 Moderate persistent asthma, uncomplicated: Secondary | ICD-10-CM

## 2019-03-25 NOTE — Progress Notes (Signed)
pft  

## 2019-03-26 ENCOUNTER — Other Ambulatory Visit: Payer: Self-pay

## 2019-03-26 ENCOUNTER — Ambulatory Visit: Payer: Medicare Other | Admitting: Internal Medicine

## 2019-03-26 ENCOUNTER — Ambulatory Visit (INDEPENDENT_AMBULATORY_CARE_PROVIDER_SITE_OTHER): Payer: Medicare Other | Admitting: Internal Medicine

## 2019-03-26 ENCOUNTER — Encounter: Payer: Self-pay | Admitting: Internal Medicine

## 2019-03-26 VITALS — BP 128/76 | HR 70 | Temp 98.4°F | Ht 62.0 in | Wt 162.0 lb

## 2019-03-26 DIAGNOSIS — J4540 Moderate persistent asthma, uncomplicated: Secondary | ICD-10-CM

## 2019-03-26 DIAGNOSIS — R918 Other nonspecific abnormal finding of lung field: Secondary | ICD-10-CM | POA: Diagnosis not present

## 2019-03-26 LAB — PULMONARY FUNCTION TEST
DL/VA % pred: 132 %
DL/VA: 5.26 ml/min/mmHg/L
DLCO cor % pred: 74 %
DLCO cor: 16.78 ml/min/mmHg
DLCO unc % pred: 75 %
DLCO unc: 17.13 ml/min/mmHg
FEF 25-75 Pre: 0.51 L/sec
FEF2575-%Pred-Pre: 25 %
FEV1-%Pred-Pre: 43 %
FEV1-Pre: 1.14 L
FEV1FVC-%Pred-Pre: 69 %
FEV6-%Pred-Pre: 61 %
FEV6-Pre: 2.05 L
FEV6FVC-%Pred-Pre: 98 %
FVC-%Pred-Pre: 62 %
FVC-Pre: 2.17 L
Pre FEV1/FVC ratio: 52 %
Pre FEV6/FVC Ratio: 94 %

## 2019-03-26 MED ORDER — SPIRIVA RESPIMAT 1.25 MCG/ACT IN AERS: 2.0000 | INHALATION_SPRAY | Freq: Every day | RESPIRATORY_TRACT | 0 refills | Status: DC

## 2019-03-26 NOTE — Patient Instructions (Addendum)
Moderate persistent asthma without complication  - stable and well controlled but still with severe (< 50%) fixed defect - continue breo as before  - start spiriva daily - for 1 months - time limited trial  - if this is helping call us in a few wweeks and we can send in scriopt -  Multiple lung nodules on CT  - largest in 66mmm Right upper lobe -stable march -> july 2019 ->July 2020  - likely benging - repeat ct chest without contrast in 12-18  months  Followup 9 months or sooner if needed - if fixed dyspnea persists can discuss possible 2nd opinion

## 2019-03-26 NOTE — Addendum Note (Signed)
Addended by: Nena Polio on: 03/26/2019 09:53 AM   Modules accepted: Orders

## 2019-03-26 NOTE — Progress Notes (Signed)
HPI   PCP Panosh, Standley Brooking, MD   HPI  IOV 08/18/2017  Chief Complaint  Jasmine presents with  . pulmonary consult    SOB with activity wheezing with exercise and a cough in the morning.    73 year old female whose husband died seen in 01-Nov-2013 for a lung nodule and he subsequently died from glioblastoma multiform A.  She is here as a new consult.  Her issues that she has had over 10 years of shortness of breath with exertion relieved by rest.  Insidious onset.  Slowly getting worse.  She also has cough with early morning wheezing.  The cough is dry but occasionally she has congestion.  Symptoms are indeed progressive.  Several years ago she was on Symbicort or Advair.  She does not remember if this improved her.  Then a few years ago she got changed to [facility] along with Asmanex.  6 months ago the Asmanex was stopped.  Overall these changes have not helped her.  She has learned to live with this. Vacuuming can  cause problems symptoms .  There is no associated chest pain.  She does not have hypertension or diabetes.  She is worried about the risk of lung cancer.  There is because a paternal grandfather and sister died from lung cancer.  Lab review shows that she has eosinophilia that is mild at 300 cells per cubic millimeter.  She has had previous allergy testing that was positive done by Dr. Neldon Mc.  Last CT scan of the chest was in 2004/11/01.  In May 2018 she had spirometry at the outside.  I personally visualized the trace.  FEV1 0.9 L / 38% FVC 46% with an obstructed ratio.  Exam nitric oxide today is elevated at 58 ppb. IN terms of smoking history, she smoke 1 ppd x 10 years but quit when she was 27.,> reports allrgy test 10 years ago as positive only for dust mites   OV 09/22/2017  Chief Complaint  Jasmine presents with  . Follow-up    PFT done, no asthma flare at this time    Jasmine Neal returns for follow-up.  She tells me that after starting Brio her symptoms are significantly improved.   In fact asthma control questionnaire shows a symptom score of 0.5.  She is now waking up at night because of asthma symptoms.  When she wakes up she has no symptoms.  She feels very slightly limited in her activities only when walking uphill.  And she is actually has a very little shortness of breath but again only while walking uphill or climbing stairs.  She has never wheezed and not use albuterol for rescue at all.  Overall is a huge improvement.   Walking desaturation test on 09/22/2017 185 feet x 3 laps on ROOM AIR:  did no desaturate. Rest pulse ox was 100%, final pulse ox was 97%. HR response 69/min at rest to 93/min at peak exertion. Jasmine Neal  Did not Desaturate < 88% . Jasmine Neal yes  Desaturated </= 3% points. Jasmine Neal yes did get tachyardic.  But she did drop pulse ox by 3 points.  In talking to her she tells me that she is got this fixed exertional dyspnea that is relieved by rest.  This may be slowly progressive.  There is no cough or wheezing.  She had pulmonary function test today and this shows significant severe obstruction and on bronchodilator responsiveness flow volume loop abnormalities.  The  DLCO was also reduced at 60%.  She is very surprised that her lung function abnormalities also severe because she does not feel the symptoms as much and she under perceives the symptoms relative to the lung function abnormality.  She is very keen on getting a CT scan of the chest because of family history of lung cancer.   OV 02/16/2018  Chief Complaint  Jasmine presents with  . Follow-up    5 month follow up to discuss test results.    Jasmine Neal eturns for follow-up of 2 issues  Moderate persistent asthma with fixed exertional dyspnea: She is tells me that with Brio her symptoms of improvement. Given the fixed exertional dyspnea is better. Overall asthma control questionnaire shows a score of 0.4 showing excellent control. She is not waking up in the middle of  the night with symptoms when she wakes up she has no symptoms. She is very slightly limited in her activities because of asthma and she's experienced very little shortness of breath. She does not have any wheeze or does not use albuterol for rescue  Follow-up multiple lung nodules: the largest in 15mmm RUL nodule and this stable march 2019  -> July 2019    IMPRESSION: CT chest Near complete re solution of ill-defined nodular opacity in medial left lower lobe, consistent with resolving inflammatory or infectious etiology.  Multiple other sub-cm bilateral pulmonary nodules remain stable, largest in right lung apex measuring 9 mm. Recommend continued follow-up by chest CT without contrast in 12 months. This recommendation follows the consensus statement: Guidelines for Management of Small Pulmonary Nodules Detected on CT Images: From the Fleischner Society 2017; Radiology 2017; 284:228-243.   Electronically Signed   By: Earle Gell M.D.   On: 01/23/2018 15:19   OV 03/26/2019  Subjective:  Jasmine ID: Jasmine Neal, female , DOB: 04/25/1946 , age 15 y.o. , MRN: [id number] , ADDRESS: [place] [id number]   03/26/2019 -   Chief Complaint  Jasmine presents with  . Moderate persistent asthma without complication    Discuss results of PFT      ICD-10-CM   1. Moderate persistent asthma without complication  123456 Tiotropium Bromide Monohydrate (SPIRIVA RESPIMAT) 1.25 MCG/ACT AERS  2. Multiple lung nodules on CT  R91.8 Tiotropium Bromide Monohydrate (SPIRIVA RESPIMAT) 1.25 MCG/ACT AERS     HPI Jasmine Neal 73 y.o. -presents for the above problems.  Visit is nearly after 1 year.  In the interim she is stable.  She is up-to-date with her vaccines.  July 2019 because of coronary artery calcifications and ongoing fixed exertional dyspnea she had a cardiac stress test I reviewed the result and this was normal.  She says the Brio is helping her but despite this she  continues to have fixed exertional dyspnea.  Her asthma control questionnaire is stable.  She had repeat lung function today it is stable and unchanged which is good but she still has severe fixed obstructive lung defect.  She has a family history of myasthenia gravis and her dad but she denies any end of the day fatigue or eyelid drooping symptoms.  Moreover the pulmonary function test is not restrictive.  There is no cough we discussed and she is interested in adding inhaler therapy.  She is not interested in second opinion for a fixed obstructive lung disease defect.   For multiple lung nodules: She has 1-1/2-year stability at this point.  Asthma Control Panel eos 300 always - last  2018, 100 in march 2019 and 400 in aug 2017. IgE 8 - march 2019 08/18/2017  09/22/2017  02/16/2018  03/26/2019   Current Med Regimen anoro breo breo breo  ACQ 5 point- 1 week. wtd avg score. <1.0 is good control 0.75-1.25 is grey zone. >1.25 poor control. Delta 0.5 is clinically meaningful 12/5 = 2.4 0.5 0.4 0.4  FeNO ppB 58  25   FeV1  0.96L/38% in May 2018 outside 1.11/425% -> 0.79/30% ratop 65 -> 39, DLCO 18.33/61%  1.14L/43% and ratio 52  Planned intervention  for visit Change anoro to breo low dose breo + get HRCT + blood allergy panel        CT Chest Lungs/Pleura: Multiple bilateral pulmonary nodules, as before. Index nodule in the apical segment right upper lobe measures 7 mm (5 x 8 mm), stable. Post radiation scarring in the anterior subpleural right lung. No pleural fluid. Airway is unremarkable.  Upper Abdomen: Visualized portions of the liver, gallbladder, adrenal glands, kidneys, spleen, pancreas, stomach and bowel are unremarkable. No upper abdominal adenopathy.  Musculoskeletal: Degenerative changes in the spine. No worrisome lytic or sclerotic lesions. Question mild radiation changes in the anterolateral right ribs, as before. No associated fracture.  IMPRESSION: Bilateral pulmonary  nodules are stable and likely benign.   Electronically Signed   By: Lorin Picket M.D.   On: 01/30/2019 13:17   Results for Jasmine Neal, Jasmine "Keimora MARIAJULIA WHITTINGTON" (MRN [id number]) as of 03/26/2019 10:13  Ref. Range 09/22/2017 09:55 03/26/2019 08:48  FEV1-Pre Latest Units: L 1.11 1.14  FEV1-%Pred-Pre Latest Units: % 42 43  Pre FEV1/FVC ratio Latest Units: % 49 52    ROS - per HPI     has a past medical history of Anxiety, Asthma, Breast cancer ([place]), Chronic airway obstruction, not elsewhere classified, Coronary artery calcification seen on CAT scan (01/23/2018), Diverticulosis, Dysplasia, Fatty liver, Fibromyalgia, nonmelanoma skin cancer, Hyperlipidemia, Hypothyroidism, Malignant neoplasm of breast (female), unspecified site (11/28/2013), Osteoarthritis, and Skin cancer.   reports that she quit smoking about 44 years ago. Her smoking use included cigarettes. She has a 8.00 pack-year smoking history. She has never used smokeless tobacco.  Past Surgical History:  Procedure Laterality Date  . BREAST LUMPECTOMY     on right, 1990 IIA radiation cmfp ajuction rx  . KNEE SURGERY  2012  . NASAL SINUS SURGERY  2007   on right done by Dr. Wilburn Cornelia    Allergies  Allergen Reactions  . Hydrocodone Itching    Face swelled up  . Hydrocodone-Acetaminophen Other (See Comments)    other    Immunization History  Administered Date(s) Administered  . Fluad Quad(high Dose 65+) 03/12/2019  . Hepatitis A 08/30/2017  . Influenza Whole 04/10/2002, 06/01/2009, 05/05/2010, 05/22/2012, 02/20/2017  . Influenza, High Dose Seasonal PF 04/22/2016  . Influenza,inj,Quad PF,6+ Mos 06/04/2013  . Influenza-Unspecified 05/23/2011  . Pneumococcal Conjugate-13 10/09/2013  . Pneumococcal Polysaccharide-23 05/05/2010  . Td 08/11/2006, 02/27/2017  . Typhoid Inactivated 09/07/2017  . Zoster 09/11/2012  . Zoster Recombinat (Shingrix) 06/06/2018    Family History  Problem Relation Age of Onset  . Other  Father        mysthenia gravis  . Lung cancer Sister   . Osteoporosis Other   . Thyroid disease Other   . Breast cancer Mother 12       possible uterine cancer diagnosis at age 28  . Osteoporosis Mother        no hip fracture  . Breast cancer Sister 63  .  Lung cancer Sister 49       former smoker  . Lung cancer Paternal Grandmother   . Colon cancer Neg Hx   . Esophageal cancer Neg Hx      Current Outpatient Medications:  .  acetaminophen (TYLENOL) 500 MG tablet, Take 1,000 mg by mouth every 6 (six) hours as needed. pain , Disp: , Rfl:  .  atorvastatin (LIPITOR) 40 MG tablet, Take 1 tablet (40 mg total) by mouth daily., Disp: 90 tablet, Rfl: 3 .  BREO ELLIPTA 100-25 MCG/INH AEPB, USE 1 INHALATION BY MOUTH  DAILY, Disp: 180 each, Rfl: 0 .  cholecalciferol (VITAMIN D) 1000 UNITS tablet, Take 1,000 Units by mouth daily., Disp: , Rfl:  .  diphenhydramine-acetaminophen (TYLENOL PM) 25-500 MG TABS, Take 1 tablet by mouth at bedtime as needed. sleep, Disp: , Rfl:  .  MAGNESIUM CITRATE PO, Take 1 tablet by mouth daily., Disp: , Rfl:  .  mirabegron ER (MYRBETRIQ) 25 MG TB24 tablet, Take 1 tablet (25 mg total) by mouth daily. May increase to 50 mg per day if needed, Disp: 30 tablet, Rfl: 1 .  PARoxetine (PAXIL) 40 MG tablet, TAKE 1 TABLET BY MOUTH AT  BEDTIME (Jasmine taking differently: 1/2 tablet daily), Disp: 90 tablet, Rfl: 3 .  SYNTHROID 88 MCG tablet, Take 1 tablet (88 mcg total) by mouth daily., Disp: 90 tablet, Rfl: 3 .  valACYclovir (VALTREX) 1000 MG tablet, TAKE 2 TABLETS BY MOUTH TWICE DAILY AS DIRECTED, Disp: 90 tablet, Rfl: prn .  vitamin C (ASCORBIC ACID) 500 MG tablet, Take 500 mg by mouth daily., Disp: , Rfl:       Objective:   Vitals:   03/26/19 1002  BP: 128/76  Pulse: 70  Temp: 98.4 F (36.9 C)  SpO2: 97%  Weight: 162 lb (73.5 kg)  Height: 5\' 2"  (1.575 m)    Estimated body mass index is 29.63 kg/m as calculated from the following:   Height as of this encounter:  5\' 2"  (1.575 m).   Weight as of this encounter: 162 lb (73.5 kg).  @WEIGHTCHANGE @  [employer]   03/26/19 1002  Weight: 162 lb (73.5 kg)     Physical Exam  General Appearance:    Alert, cooperative, no distress, appears stated age - yes , Deconditioned looking - no , OBESE  - no, Sitting on Wheelchair -  no  Head:    Normocephalic, without obvious abnormality, atraumatic  Eyes:    PERRL, conjunctiva/corneas clear,  Ears:    Normal TM's and external ear canals, both ears  Nose:   Nares normal, septum midline, mucosa normal, no drainage    or sinus tenderness. OXYGEN ON  - no . Jasmine is @ ra   Throat:   Lips, mucosa, and tongue normal; teeth and gums normal. Cyanosis on lips - no  Neck:   Supple, symmetrical, trachea midline, no adenopathy;    thyroid:  no enlargement/tenderness/nodules; no carotid   bruit or JVD  Back:     Symmetric, no curvature, ROM normal, no CVA tenderness  Lungs:     Distress - no , Wheeze no, Barrell Chest - no, Purse lip breathing - no, Crackles - no   Chest Wall:    No tenderness or deformity.    Heart:    Regular rate and rhythm, S1 and S2 normal, no rub   or gallop, Murmur - no  Breast Exam:    NOT DONE  Abdomen:     Soft, non-tender, bowel  sounds active all four quadrants,    no masses, no organomegaly. Visceral obesity - no  Genitalia:   NOT DONE  Rectal:   NOT DONE  Extremities:   Extremities - normal, Has Cane - no, Clubbing - no, Edema - no  Pulses:   2+ and symmetric all extremities  Skin:   Stigmata of Connective Tissue Disease - no  Lymph nodes:   Cervical, supraclavicular, and axillary nodes normal  Psychiatric:  Neurologic:   Pleasant - yes, Anxious - no, Flat affect - no  CAm-ICU - neg, Alert and Oriented x 3 - yes, Moves all 4s - yes, Speech - normal, Cognition - intact           Assessment:       ICD-10-CM   1. Moderate persistent asthma without complication  123456   2. Multiple lung nodules on CT  R91.8        Plan:      Jasmine Instructions  Moderate persistent asthma without complication  - stable and well controlled but still with severe (< 50%) fixed defect - continue breo as before  - start spiriva daily - for 1 months - time limited trial  - if this is helping call us in a few wweeks and we can send in scriopt -  Multiple lung nodules on CT  - largest in 49mmm Right upper lobe -stable march -> july 2019 ->July 2020  - likely benging - repeat ct chest without contrast in 12-18  months  Followup 9 months or sooner if needed - if fixed dyspnea persists can discuss possible 2nd opinion     SIGNATURE    Dr. Brand Males, M.D., F.C.C.P,  Pulmonary and Critical Care Medicine Staff Physician, [place] Director - Interstitial Lung Disease  Program  [facility] [place] [facility] [place], [place], [address]  Pager: [phone], If no answer or between  15:00h - 7:00h: call [phone] Telephone: [phone]  10:33 AM 03/26/2019

## 2019-03-26 NOTE — Progress Notes (Signed)
Spiro/dlco performed today.

## 2019-04-06 ENCOUNTER — Other Ambulatory Visit: Payer: Self-pay | Admitting: Internal Medicine

## 2019-04-08 ENCOUNTER — Ambulatory Visit: Payer: Self-pay | Admitting: *Deleted

## 2019-04-08 NOTE — Telephone Encounter (Signed)
Pt declined appt. Please advise.

## 2019-04-08 NOTE — Telephone Encounter (Signed)
Please advise 

## 2019-04-08 NOTE — Telephone Encounter (Signed)
Agree with self  monitoring  Her conditions  For   Signs of complication head injury ( assume she was told what concerning  Symptoms  to look for and if not please do this)    Could get a headache   But  Fu if  persistent or progressive  Or concerns .

## 2019-04-08 NOTE — Telephone Encounter (Signed)
Left detailed message for pt 

## 2019-04-08 NOTE — Telephone Encounter (Signed)
Patient is calling to report she had bad dream last night and fell/dove out of bed and hit her head on furniture. Patient states she has 2 small cut above the forehead in the hair -R. Patient did stop the bleeding and has not had any neurological symptoms. Patient did use ice for immediate swelling and she reports it has worked well. Patient is taking Tylenol for any residual soreness. Patient states she has the treatment under control and feels she wants to watch the area today. Patient was given perimeters for call back.  Reason for Disposition . Scalp swelling, bruise or pain  Answer Assessment - Initial Assessment Questions 1. MECHANISM: "How did the injury happen?" For falls, ask: "What height did you fall from?" and "What surface did you fall against?"      Patient had bad dream and fell out of bed and cut her head on furniture 2. ONSET: "When did the injury happen?" (Minutes or hours ago)      1:30 this morning 3. NEUROLOGIC SYMPTOMS: "Was there any loss of consciousness?" "Are there any other neurological symptoms?"      No- no symptoms 4. MENTAL STATUS: "Does the person know who he is, who you are, and where he is?"      Alert and orented 5. LOCATION: "What part of the head was hit?"      R top above R eye 6. SCALP APPEARANCE: "What does the scalp look like? Is it bleeding now?" If so, ask: "Is it difficult to stop?"      Small cut- 2 little cuts- no need for stitched- bleeding stopped 7. SIZE: For cuts, bruises, or swelling, ask: "How large is it?" (e.g., inches or centimeters)      Small cuts, last night there was a knot- small knot still present and swelling above the eye- ice seemed to take care of that 8. PAIN: "Is there any pain?" If so, ask: "How bad is it?"  (e.g., Scale 1-10; or mild, moderate, severe)     Sore- patient has taken tylenol 9. TETANUS: For any breaks in the skin, ask: "When was the last tetanus booster?"     Up to date 10. OTHER SYMPTOMS: "Do you have any other  symptoms?" (e.g., neck pain, vomiting)       no 11. PREGNANCY: "Is there any chance you are pregnant?" "When was your last menstrual period?"       n/a  Protocols used: HEAD INJURY-A-AH

## 2019-04-12 ENCOUNTER — Ambulatory Visit: Payer: Medicare Other | Admitting: Gastroenterology

## 2019-04-15 ENCOUNTER — Telehealth: Payer: Self-pay | Admitting: *Deleted

## 2019-04-15 ENCOUNTER — Encounter: Payer: Self-pay | Admitting: Internal Medicine

## 2019-04-15 ENCOUNTER — Telehealth (INDEPENDENT_AMBULATORY_CARE_PROVIDER_SITE_OTHER): Payer: Medicare Other | Admitting: Internal Medicine

## 2019-04-15 ENCOUNTER — Other Ambulatory Visit: Payer: Self-pay

## 2019-04-15 DIAGNOSIS — G479 Sleep disorder, unspecified: Secondary | ICD-10-CM | POA: Diagnosis not present

## 2019-04-15 DIAGNOSIS — G4750 Parasomnia, unspecified: Secondary | ICD-10-CM

## 2019-04-15 DIAGNOSIS — Z87820 Personal history of traumatic brain injury: Secondary | ICD-10-CM | POA: Diagnosis not present

## 2019-04-15 NOTE — Telephone Encounter (Signed)
Patient is calling to report that she is doing much better from her fall out of bed- although she did get black eye. Most of her lumps are gone and she reports no headaches. Patient is concerned about her sleep patterns and thinks she may have REM sleep disturbance. Patient would like to discuss referral for that with PCP- appointment has been scheduled for her.

## 2019-04-15 NOTE — Progress Notes (Signed)
Virtual Visit via Video Note  I connected with@ on 04/15/19 at 10:45 AM EDT by a video enabled telemedicine application and verified that I am speaking with the correct person using two identifiers. Location patient: home Location provider:work  office Persons participating in the virtual visit: patient, provider  WIth national recommendations  regarding COVID 19 pandemic   video visit is advised over in office visit for this patient.  Patient aware  of the limitations of evaluation and management by telemedicine and  availability of in person appointments. and agreed to proceed.   HPI: Jasmine Neal presents for video visit  For problems below concern about rem sleep disorder since falling out of bead some apprehension about sleep  But doing finre today      04/15/19 8:39 AM FYI: Requesting virtual visit - appointment scheduled   04/15/19 8:39 AM Note   Patient is calling to report that she is doing much better from her fall out of bed- although she did get black eye. Most of her lumps are gone and she reports no headaches. Patient is concerned about her sleep patterns and thinks she may have REM sleep disturbance. Patient would like to discuss referral for that with PCP- appointment has been scheduled for her.      She had had 2 night mares this year that she has acted out   Out of bed x 2  Last one see notes  dove out of bec head first and had hematoma right forehead without  Vision changes some soreness  currently better from that .  Has decreased the paxil to  1.2 alt with whole dose every other day .  No hx of  sleepwalking as a child    Her husband used to report some increased movement  During  her sleep talking and laughing.   No sleep aids x benadryl pm.   ROS: See pertinent positives and negatives per HPI. No fever  Tremors current headache issues  Vision changes   Past Medical History:  Diagnosis Date  . Anxiety   . Asthma    evalutated by pulmonary severe by  spirometry no smoker currently  . Breast cancer ([place])    hx of right lumpectomy, 1990 IIA radiation cmfp ajunct rx  . Chronic airway obstruction, not elsewhere classified   . Coronary artery calcification seen on CAT scan 01/23/2018  . Diverticulosis    only has like 2  . Dysplasia    hx treated with cerv conization  . Fatty liver    US done 2010  . Fibromyalgia   . Hx of nonmelanoma skin cancer     followed dr Martinique   . Hyperlipidemia   . Hypothyroidism   . Malignant neoplasm of breast (female), unspecified site 11/28/2013  . Osteoarthritis   . Skin cancer    squamous cell    Past Surgical History:  Procedure Laterality Date  . BREAST LUMPECTOMY     on right, 1990 IIA radiation cmfp ajuction rx  . KNEE SURGERY  2012  . NASAL SINUS SURGERY  2007   on right done by Dr. Wilburn Cornelia    Family History  Problem Relation Age of Onset  . Other Father        mysthenia gravis  . Lung cancer Sister   . Osteoporosis Other   . Thyroid disease Other   . Breast cancer Mother 99       possible uterine cancer diagnosis at age 34  . Osteoporosis Mother  no hip fracture  . Breast cancer Sister 73  . Lung cancer Sister 73       former smoker  . Lung cancer Paternal Grandmother   . Colon cancer Neg Hx   . Esophageal cancer Neg Hx     Social History   Tobacco Use  . Smoking status: Former Smoker    Packs/day: 0.80    Years: 10.00    Pack years: 8.00    Types: Cigarettes    Quit date: 07/11/1974    Years since quitting: 44.7  . Smokeless tobacco: Never Used  Substance Use Topics  . Alcohol use: Yes    Alcohol/week: 7.0 standard drinks    Types: 7 Standard drinks or equivalent per week    Comment: 1 glass of red wine nightly and socially  . Drug use: No      Current Outpatient Medications:  .  acetaminophen (TYLENOL) 500 MG tablet, Take 1,000 mg by mouth every 6 (six) hours as needed. pain , Disp: , Rfl:  .  atorvastatin (LIPITOR) 40 MG tablet, Take 1 tablet (40 mg  total) by mouth daily., Disp: 90 tablet, Rfl: 3 .  BREO ELLIPTA 100-25 MCG/INH AEPB, USE 1 INHALATION BY MOUTH  DAILY, Disp: 180 each, Rfl: 3 .  cholecalciferol (VITAMIN D) 1000 UNITS tablet, Take 1,000 Units by mouth daily., Disp: , Rfl:  .  diphenhydramine-acetaminophen (TYLENOL PM) 25-500 MG TABS, Take 1 tablet by mouth at bedtime as needed. sleep, Disp: , Rfl:  .  MAGNESIUM CITRATE PO, Take 1 tablet by mouth daily., Disp: , Rfl:  .  mirabegron ER (MYRBETRIQ) 25 MG TB24 tablet, Take 1 tablet (25 mg total) by mouth daily. May increase to 50 mg per day if needed, Disp: 30 tablet, Rfl: 1 .  PARoxetine (PAXIL) 40 MG tablet, TAKE 1 TABLET BY MOUTH AT  BEDTIME (Patient taking differently: 1/2 tablet daily), Disp: 90 tablet, Rfl: 3 .  SYNTHROID 88 MCG tablet, Take 1 tablet (88 mcg total) by mouth daily., Disp: 90 tablet, Rfl: 3 .  Tiotropium Bromide Monohydrate (SPIRIVA RESPIMAT) 1.25 MCG/ACT AERS, Inhale 2 puffs into the lungs daily., Disp: 2 g, Rfl: 0 .  valACYclovir (VALTREX) 1000 MG tablet, TAKE 2 TABLETS BY MOUTH TWICE DAILY AS DIRECTED, Disp: 90 tablet, Rfl: prn .  vitamin C (ASCORBIC ACID) 500 MG tablet, Take 500 mg by mouth daily., Disp: , Rfl:   EXAM: BP Readings from Last 3 Encounters:  03/26/19 128/76  03/12/19 120/78  09/11/18 122/74    VITALS per patient if applicable:  GENERAL: alert, oriented, appears well and in no acute distress  HEENT: fading right forehead heamtoma  Eyes clear eoms  Full , conjunttiva clear, no obvious abnormalities on inspection of external nose and ears  NECK: normal movements of the head and neck LUNGS: on inspection no signs of respiratory distress, breathing rate appears normal, no obvious gross SOB, gasping or wheezing CV: no obvious cyanosis PSYCH/NEURO: pleasant and cooperative, no obvious depression or anxiety, speech and thought processing grossly intact Lab Results  Component Value Date   WBC 3.9 (L) 03/12/2019   HGB 14.1 03/12/2019   HCT  42.5 03/12/2019   PLT 155.0 03/12/2019   GLUCOSE 85 03/12/2019   CHOL 205 (H) 03/12/2019   TRIG 89.0 03/12/2019   HDL 68.30 03/12/2019   LDLDIRECT 137.0 09/11/2012   LDLCALC 119 (H) 03/12/2019   ALT 46 (H) 03/12/2019   AST 42 (H) 03/12/2019   NA 140 03/12/2019   K  4.4 03/12/2019   CL 104 03/12/2019   CREATININE 0.81 03/12/2019   BUN 18 03/12/2019   CO2 30 03/12/2019   TSH 1.29 03/12/2019   HGBA1C 5.7 03/12/2019    ASSESSMENT AND PLAN:  Discussed the following assessment and plan:    ICD-10-CM   1. Sleep disorder  G47.9 Ambulatory referral to Neurology  2. Parasomnia, ? unspecified type ?  G47.50 Ambulatory referral to Neurology   dove out of bed  causing injury now recovered   3. History of closed head injury hematoma resolving   Z87.820     no new neuro sx    referral as discussed  .  At this time no change in paxil weaning  Dose   Counseled.   Expectant management and discussion of plan and treatment with opportunity to ask questions and all were answered. The patient agreed with the plan and demonstrated an understanding of the instructions.   Advised to call back or seek an in-person evaluation if worsening  or having  further concerns . headaches etc.      Shanon Ace, MD

## 2019-04-16 ENCOUNTER — Telehealth: Payer: Medicare Other | Admitting: Internal Medicine

## 2019-04-22 ENCOUNTER — Encounter: Payer: Self-pay | Admitting: Neurology

## 2019-04-22 ENCOUNTER — Other Ambulatory Visit: Payer: Self-pay

## 2019-04-22 ENCOUNTER — Ambulatory Visit: Payer: Medicare Other | Admitting: Neurology

## 2019-04-22 VITALS — BP 121/60 | HR 70 | Temp 97.5°F | Ht 69.0 in | Wt 162.0 lb

## 2019-04-22 DIAGNOSIS — R0602 Shortness of breath: Secondary | ICD-10-CM

## 2019-04-22 DIAGNOSIS — G4750 Parasomnia, unspecified: Secondary | ICD-10-CM | POA: Diagnosis not present

## 2019-04-22 DIAGNOSIS — I2510 Atherosclerotic heart disease of native coronary artery without angina pectoris: Secondary | ICD-10-CM

## 2019-04-22 DIAGNOSIS — R0683 Snoring: Secondary | ICD-10-CM

## 2019-04-22 DIAGNOSIS — R4189 Other symptoms and signs involving cognitive functions and awareness: Secondary | ICD-10-CM

## 2019-04-22 DIAGNOSIS — J449 Chronic obstructive pulmonary disease, unspecified: Secondary | ICD-10-CM

## 2019-04-22 DIAGNOSIS — R350 Frequency of micturition: Secondary | ICD-10-CM

## 2019-04-22 MED ORDER — ALPRAZOLAM 0.5 MG PO TABS: 0.5000 mg | ORAL_TABLET | Freq: Every evening | ORAL | 0 refills | Status: DC | PRN

## 2019-04-22 NOTE — Progress Notes (Signed)
SLEEP MEDICINE CLINIC    Provider:  Larey Seat, MD  Primary Care Physician:  Burn[address], [place] [address]     Referring Provider: same          Chief Complaint according to patient   Patient presents with:    . New Patient (Initial Visit)           HISTORY OF PRESENT ILLNESS:  Jasmine Neal is a 73 y.o. year old White or Caucasian female patient seen here  on 04/22/2019  for a sleep evaluation.  Chief concern according to patient : "pt states when her husband was alive he would talk about her moving in sleep and laughing in sleep.  She presently she feels she is acting out her dreams. one time she fell out of bed and then other time she dove out ot bed, injuring herself both times".    I have the pleasure of seeing Jasmine Neal today, a right -handed widowed  Caucasian female with a possible sleep disorder. Her husband died of Glioblastoma multiforme.   She has a past medical history of Anxiety, Asthma, Breast cancer ([place]), Chronic airway obstruction, not elsewhere classified, Coronary artery calcification seen on CAT scan (01/23/2018), Diverticulosis, Dysplasia, Fatty liver, Fibromyalgia, nonmelanoma skin cancer, Hyperlipidemia, Hypothyroidism, Malignant neoplasm of breast (female), unspecified site (11/28/2013), Osteoarthritis, and Skin cancer. 6-8 years ago onset of Parasomnia, sleep laughing, new is falling out of BED.     Sleep relevant medical history: Nocturia 3 times , Sleep walking/ Parasomnia : yes.    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 She lives alone. Family status is widowed with adult daughter but no grandchildren. Pets are present. One dog.  Tobacco use: from age [age], 1/2 pack a day. ETOH use - 1-2 glasses of wine each night, 6-10 ounces. Caffeine intake in form of Coffee( 3 a day/ AM) Soda(none  ) Tea ( rarely) or energy  drinks. Regular exercise in form of walking, biking.  Hobbies :social activities.     Sleep habits are as follows: The patient's dinner time is between 5.30-6 PM. The patient goes to bed at 11 PM reading for 30 minutes and falling asleep with Tylenol PM.. She continues to sleep for many hours, wakes for 3 bathroom breaks, goes right back to sleep.  The bedroom is cool, quiet and dark.  The preferred sleep position is lateral , with the support of 2 pillows.  Dreams are reportedly frequent/vivid.    7.30 AM is the usual rise time. The patient wakes up spontaneously at about 7.30 AM.   She reports mostly feeling refreshed or restored in AM, with symptoms such as dry mouth, morning achiness, no headaches. But some residual fatigue.  Naps are taken infrequently.  Review of Systems: Out of a complete 14 system review, the patient complains of only the following symptoms, and all other reviewed systems are negative.:  Mild Fatigue, mild sleepiness , not sure about snoring, fragmented sleep.PARASOMNIA , dream enactment.    How likely are you to doze in the following situations: 0 = not likely, 1 = slight chance, 2 = moderate chance, 3 = high chance   Sitting and Reading? Watching Television? Sitting inactive in a public place (theater or meeting)? As a passenger in a car for an hour without a break? Lying down in the afternoon when circumstances permit? Sitting and  talking to someone? Sitting quietly after lunch without alcohol? In a car, while stopped for a few minutes in traffic?   Total = 3/ 24 points   FSS endorsed at 18/ 63 points.   Social History   Socioeconomic History  . Marital status: Widowed    Spouse name: Not on file  . Number of children: 1  . Years of education: Not on file  . Highest education level: Not on file  Occupational History  . Occupation: Retired Tour manager  . Financial resource strain: Not on file  . Food insecurity    Worry: Not on file     Inability: Not on file  . Transportation needs    Medical: Not on file    Non-medical: Not on file  Tobacco Use  . Smoking status: Former Smoker    Packs/day: 0.80    Years: 10.00    Pack years: 8.00    Types: Cigarettes    Quit date: 07/11/1974    Years since quitting: 44.8  . Smokeless tobacco: Never Used  Substance and Sexual Activity  . Alcohol use: Yes    Alcohol/week: 7.0 standard drinks    Types: 7 Standard drinks or equivalent per week    Comment: 1 glass of red wine nightly and socially  . Drug use: No  . Sexual activity: Yes    Partners: Male  Lifestyle  . Physical activity    Days per week: Not on file    Minutes per session: Not on file  . Stress: Not on file  Relationships  . Social Herbalist on phone: Not on file    Gets together: Not on file    Attends religious service: Not on file    Active member of club or organization: Not on file    Attends meetings of clubs or organizations: Not on file    Relationship status: Not on file  Other Topics Concern  . Not on file  Social History Narrative   Retired Pharmacist, hospital    hh of 1   Now has puppy    Quit smoking 30 + years ago. 20 year pack year smoking hx   Regular exercise- some  treadmill    8 hours sleep         Caffeine use: 1-2 cups of coffee/[employer] instant   Husband dx glioblastoma fall 15   Passed away fall 10-23-14    Family History  Problem Relation Age of Onset  . Other Father        mysthenia gravis  . Lung cancer Sister   . Osteoporosis Other   . Thyroid disease Other   . Breast cancer Mother 20       possible uterine cancer diagnosis at age 9  . Osteoporosis Mother        no hip fracture  . Breast cancer Sister 46  . Lung cancer Sister 14       former smoker  . Lung cancer Paternal Grandmother   . Colon cancer Neg Hx   . Esophageal cancer Neg Hx     Past Medical History:  Diagnosis Date  . Anxiety   . Asthma    evalutated by pulmonary severe by spirometry no smoker  currently  . Breast cancer ([place])    hx of right lumpectomy, 1990 IIA radiation cmfp ajunct rx  . Chronic airway obstruction, not elsewhere classified   . Coronary artery calcification seen on CAT scan 01/23/2018  . Diverticulosis  only has like 2  . Dysplasia    hx treated with cerv conization  . Fatty liver    US done 2010  . Fibromyalgia   . Hx of nonmelanoma skin cancer     followed dr Martinique   . Hyperlipidemia   . Hypothyroidism   . Malignant neoplasm of breast (female), unspecified site 11/28/2013  . Osteoarthritis   . Skin cancer    squamous cell    Past Surgical History:  Procedure Laterality Date  . BREAST LUMPECTOMY     on right, 1990 IIA radiation cmfp ajuction rx  . KNEE SURGERY  2012  . NASAL SINUS SURGERY  2007   on right done by Dr. Wilburn Cornelia     Current Outpatient Medications on File Prior to Visit  Medication Sig Dispense Refill  . acetaminophen (TYLENOL) 500 MG tablet Take 1,000 mg by mouth daily as needed. pain     . atorvastatin (LIPITOR) 40 MG tablet Take 1 tablet (40 mg total) by mouth daily. 90 tablet 3  . BREO ELLIPTA 100-25 MCG/INH AEPB USE 1 INHALATION BY MOUTH  DAILY 180 each 3  . cholecalciferol (VITAMIN D) 1000 UNITS tablet Take 1,000 Units by mouth daily.    . diphenhydramine-acetaminophen (TYLENOL PM) 25-500 MG TABS Take 1 tablet by mouth at bedtime as needed. sleep    . MAGNESIUM CITRATE PO Take 1 tablet by mouth daily.    . mirabegron ER (MYRBETRIQ) 25 MG TB24 tablet Take 1 tablet (25 mg total) by mouth daily. May increase to 50 mg per day if needed 30 tablet 1  . PARoxetine (PAXIL) 40 MG tablet TAKE 1 TABLET BY MOUTH AT  BEDTIME (Patient taking differently: 1/2 tablet daily) 90 tablet 3  . SYNTHROID 88 MCG tablet Take 1 tablet (88 mcg total) by mouth daily. 90 tablet 3  . Tiotropium Bromide Monohydrate (SPIRIVA RESPIMAT) 1.25 MCG/ACT AERS Inhale 2 puffs into the lungs daily. 2 g 0  . valACYclovir (VALTREX) 1000 MG tablet TAKE 2 TABLETS BY  MOUTH TWICE DAILY AS DIRECTED (Patient taking differently: TAKE 2 TABLETS BY MOUTH TWICE DAILY AS DIRECTED as needed for cold sores) 90 tablet prn  . vitamin B-12 (CYANOCOBALAMIN) 1000 MCG tablet Take 1,000 mcg by mouth daily.     No current facility-administered medications on file prior to visit.     Allergies  Allergen Reactions  . Hydrocodone Itching    Face swelled up  . Hydrocodone-Acetaminophen Other (See Comments)    other    Physical exam:  Today's Vitals   04/22/19 1305  BP: 121/60  Pulse: 70  Temp: (!) 97.5 F (36.4 C)  Weight: 162 lb (73.5 kg)  Height: 5\' 9"  (1.753 m)   Body mass index is 23.92 kg/m.   Wt Readings from Last 3 Encounters:  04/22/19 162 lb (73.5 kg)  03/26/19 162 lb (73.5 kg)  03/12/19 162 lb 3.2 oz (73.6 kg)     Ht Readings from Last 3 Encounters:  04/22/19 5\' 9"  (1.753 m)  03/26/19 5\' 2"  (1.575 m)  03/12/19 5\' 8"  (1.727 m)      General: The patient is awake, alert and appears not in acute distress. The patient is well groomed. Head: Normocephalic, atraumatic. Neck is supple. Mallampati 2 neck circumference:14  inches . Nasal airflow  patent.  Retrognathia is not seen. No retainers nor braces.  Dental status: dental crowns.  Cardiovascular:  Regular rate and cardiac rhythm by pulse,  without distended neck veins. Respiratory: Lungs are clear  to auscultation.  Skin:  Without evidence of ankle edema, or rash. Trunk: The patient's posture is erect.   Neurologic exam : The patient is awake and alert, oriented to place and time.   Memory subjective described as intact.  Attention span & concentration ability appears normal.  Speech is fluent,  without  dysarthria, dysphonia or aphasia.  Mood and affect are appropriate.   Cranial nerves: no loss of smell or taste reported  Pupils are equal and briskly reactive to light.  Funduscopic exam deferred.  Extraocular movements in vertical and horizontal planes were intact and without  nystagmus. No Diplopia. Visual fields by finger perimetry are intact. Hearing was intact to soft voice and finger rubbing.    Facial sensation intact to fine touch.  Facial motor strength is symmetric and tongue and uvula move midline.  Neck ROM : rotation, tilt and flexion extension were normal for age and shoulder shrug was symmetrical.    Motor exam:  Symmetric bulk, tone and ROM.   Normal tone without cog wheeling, symmetric grip strength .   Sensory:  Fine touch, pinprick and vibration were tested  and  normal.  Proprioception tested in the upper extremities was normal.   Coordination: Rapid alternating movements in the fingers/hands were of normal speed.  The Finger-to-nose maneuver was intact without evidence of ataxia, dysmetria or tremor.   Gait and station: Patient could rise unassisted from a seated position, walked without assistive device.  Stance is of normal width/ base .  Toe and heel walk were deferred.  Deep tendon reflexes: in the  upper and lower extremities are symmetric and intact.  Babinski response was deferred.      After spending a total time of  45  minutes face to face and additional time for physical and neurologic examination, review of laboratory studies,  personal review of imaging studies, reports and results of other testing and review of referral information / records as far as provided in visit, I have established the following assessments:  1) first onset of  Parasomnia 6 moth ago, second time 2 month ago- first time her dreams were almost nightmarish, she was chased. 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 She has had enuresis.  She had spells around 1- 1.30 in AM- within 120 minutes of sleep onset.    My Plan is to proceed with: Xanax for sleep study .   1) Attended Sleep study with REM parasomnia montage. 2) She may need to have a sleep aid.  3) No RLS- but kicking in her sleep- is on paroxetine, for  breast cancer related sleep changes and depression.  Paroxetine weaning is a very slow process- go to 50% dose, and take it every other day to wean off.    I would like to thank Panosh, Standley Brooking, MD and Regis Bill Standley Brooking, [place],  [place] [address] for allowing me to meet with and to take care of this pleasant patient.   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   CC: I will share my notes with PCP, Dr. Regis Bill.   Electronically signed by: Larey Seat, MD 04/22/2019 1:07 PM  [facility] Board certified by The [employer] of  Sleep Medicine and Diplomate of the [employer] [employer] of Sleep Medicine. Board certified In Neurology through the [place], Fellow of the [employer] of Neurology. Medical Director of [facility].

## 2019-04-25 ENCOUNTER — Encounter: Payer: Self-pay | Admitting: Gastroenterology

## 2019-04-25 ENCOUNTER — Telehealth: Payer: Self-pay | Admitting: Internal Medicine

## 2019-04-25 ENCOUNTER — Ambulatory Visit: Payer: Medicare Other | Admitting: Gastroenterology

## 2019-04-25 VITALS — BP 96/66 | HR 72 | Temp 98.4°F | Ht 69.0 in | Wt 163.0 lb

## 2019-04-25 DIAGNOSIS — R748 Abnormal levels of other serum enzymes: Secondary | ICD-10-CM | POA: Diagnosis not present

## 2019-04-25 DIAGNOSIS — R918 Other nonspecific abnormal finding of lung field: Secondary | ICD-10-CM

## 2019-04-25 DIAGNOSIS — J4540 Moderate persistent asthma, uncomplicated: Secondary | ICD-10-CM

## 2019-04-25 MED ORDER — SPIRIVA RESPIMAT 1.25 MCG/ACT IN AERS: 2.0000 | INHALATION_SPRAY | Freq: Every day | RESPIRATORY_TRACT | 2 refills | Status: DC

## 2019-04-25 NOTE — Telephone Encounter (Signed)
Pt wanting 3 month supply of Spiriva Respimat sent to [facility]. No need to call back after rx is sent.

## 2019-04-25 NOTE — Telephone Encounter (Signed)
ATC patient unable to reach left message to call back x1  From last OV MR said to trial and if working sending Rx for the spiriva 1.25.

## 2019-04-25 NOTE — Telephone Encounter (Signed)
90 day supply sent Nothing further needed.  Will not call patient per her request

## 2019-04-25 NOTE — Progress Notes (Signed)
Referring Provider: Burnis Medin, MD Primary Care Physician:  Burnis Medin, MD   Chief complaint:  abnormal liver enzymes   IMPRESSION:  History of abnormal liver enzymes    - isolated ALT 37 02/2016, 102 03/2016    - all repeat testing has been normal until 03/2019    - ultrasound and MRI 2017 were essentially normal    - hepatitis C antibody negative 09/11/2012    - ANA negative 05/15/2016    - celiac disease serology 08/30/16: TTGA negative, IgA 206    - 5' nucleotidase 11 (normal 0-10)    - ongoing alcohol use Buttock, groin, rectum and thigh pain of unclear etiology    - normal rectum exam without reproduction of symptoms Concerns for hemorrhoids - without any noted on physical exam or prior colonoscopy reports History of colon polyp 2014    - colonoscopy with Dr. Allyn Kenner    - pathology results not known    - no polyps on colonoscopy 2000    [facility] suggested follow-up colonoscopy in 2019-2024 Diverticulosis without history of diverticulitis  Recent labs show mildly elevated transaminases. Will proceed with further evaluation given the intermittent elevation that has occurred over the last few years. However, this may ultimately be due to her daily alcohol. Screening for hereditary hemachromatosis will also be important given her prior MRI findings.    Will proceed with serologic imaging and elastography. Repeat MRI if transaminases continue to increase.   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 Regis Bill for elevated liver enzymes. She was last seen by me 09/11/18. The interval history is obtained through the patient and review of  her electronic health record. She has previously been under the care of Dr. Allyn Kenner. History of anxiety, arthritis, asthma, breast cancer s/p lumpectomy 1990, diverticulosis, hypercholesterolemia, and hypothyroidism.  History of elevated liver enzymes. Elevated several years ago and again last year. Normal when checked earlier this year. Previously evaluated by Dr. Allyn Kenner, most recently in 2018. Told she had fatty liver. Also suggested celiac, but, testing for that was negative. Liver enzymes are now normal again.  Ultrasound and MRI at that time were normal. No known family history of liver disease. No risk factors for chronic viral hepatitis. Prior screening for HCV was negative. No history of jaundice or scleral icterus.   Review of EPIC shows an isolated ALT 37 02/2016 and  ALT 102 03/2016. They have been normal since that time until September.  Recent labs 03/12/19: AST 42, ALT 46, alk phos 96, TB 0.5.  Drinking one glass of red wine with dinner historically. Now drinking two glasses of wine with changes attributed to coronavirus.   Prior endoscopy history: Colonoscopy with Dr. Allyn Kenner 2000: diverticulosis. No hemorrhoids mentioned.  Colonoscopy with Dr. Allyn Kenner 12/28/00: Normal, repeat in 5 years given history of breast cancer Colonoscopy with Dr. Allyn Kenner 2014: small polyp, diverticulosis. No hemorrhoids mentioned.   Prior abdominal imaging: - Abdominal ultrasound 04/11/16: heterogeneous hepatic echotexture without discrete mass. No gallstones.  - MRI 05/10/16: no steatosis; slightly reduced in phase parenchymal signal intensity in the liver - ? Hemochromatosis although this is not supported by other sequences  Past Medical History:  Diagnosis Date  . Anxiety   . Asthma    evalutated by pulmonary severe by spirometry no smoker currently  . Breast cancer ([place])    hx of right lumpectomy, 1990 IIA radiation cmfp ajunct rx  . Chronic airway obstruction, not elsewhere classified   . Coronary  artery calcification seen on CAT scan 01/23/2018  . Diverticulosis    only has like 2  . Dysplasia    hx treated with cerv conization  . Fatty liver    US done 2010  . Fibromyalgia   . Hx of nonmelanoma skin cancer     followed dr [place]   . Hyperlipidemia   . Hypothyroidism   . Malignant neoplasm of breast (female), unspecified site 11/28/2013  . Osteoarthritis   . Skin cancer    squamous cell    Past Surgical History:  Procedure Laterality Date  . BREAST LUMPECTOMY     on right, 1990 IIA radiation cmfp ajuction rx  . KNEE SURGERY  2012  . NASAL SINUS SURGERY  2007   on right done by Dr. Wilburn Cornelia    Current Outpatient Medications  Medication Sig Dispense Refill  . acetaminophen (TYLENOL) 500 MG tablet Take 1,000 mg by mouth daily as needed. pain     . atorvastatin (LIPITOR) 40 MG tablet Take 1 tablet (40 mg total) by mouth daily. (Patient taking differently: Take 20 mg by mouth daily. ) 90 tablet 3  . BREO ELLIPTA 100-25 MCG/INH AEPB USE 1 INHALATION BY MOUTH  DAILY 180 each 3  . Cholecalciferol (VITAMIN D3) 50 MCG (2000 UT) TABS Take by mouth daily.    Marland Kitchen PARoxetine (PAXIL) 40 MG tablet TAKE 1 TABLET BY MOUTH AT  BEDTIME (Patient taking differently: 1/2 tablet every other day, tapering to get off) 90 tablet 3  . SYNTHROID 88 MCG tablet Take 1 tablet (88 mcg total) by mouth daily. 90 tablet 3  . Tiotropium Bromide Monohydrate (SPIRIVA RESPIMAT) 1.25 MCG/ACT AERS Inhale 2 puffs into the lungs daily. 2 g 0  . valACYclovir (VALTREX) 1000 MG tablet TAKE 2 TABLETS BY MOUTH TWICE DAILY AS DIRECTED (Patient taking differently: TAKE 2 TABLETS BY MOUTH TWICE DAILY AS DIRECTED as needed for cold sores) 90 tablet prn  . vitamin B-12 (CYANOCOBALAMIN) 1000 MCG tablet Take 1,000 mcg by mouth daily.    Marland Kitchen ALPRAZolam (XANAX) 0.5 MG tablet Take 1 tablet (0.5 mg total) by mouth at bedtime as needed for anxiety. (Patient not taking: Reported on 04/25/2019) 14 tablet 0  .  diphenhydramine-acetaminophen (TYLENOL PM) 25-500 MG TABS Take 1 tablet by mouth at bedtime as needed. sleep    . mirabegron ER (MYRBETRIQ) 25 MG TB24 tablet Take 1 tablet (25 mg total) by mouth daily. May increase to 50 mg per day if needed (Patient not taking: Reported on 04/25/2019) 30 tablet 1   No current facility-administered medications for this visit.     Allergies as of 04/25/2019 - Review Complete 04/25/2019  Allergen Reaction Noted  . Hydrocodone Itching 11/08/2010  . Hydrocodone-acetaminophen Other (See Comments) 02/01/2016    Family History  Problem Relation Age of Onset  . Other Father        mysthenia gravis  . Lung cancer Sister   . Osteoporosis Other   . Thyroid disease Other   . Breast cancer Mother 33       possible uterine cancer diagnosis at age 1  . Osteoporosis Mother        no hip  fracture  . Breast cancer Sister 25  . Lung cancer Sister 65       former smoker  . Lung cancer Paternal Grandmother   . Colon cancer Neg Hx   . Esophageal cancer Neg Hx     Social History   Socioeconomic History  . Marital status: Widowed    Spouse name: Not on file  . Number of children: 1  . Years of education: Not on file  . Highest education level: Not on file  Occupational History  . Occupation: Retired Tour manager  . Financial resource strain: Not on file  . Food insecurity    Worry: Not on file    Inability: Not on file  . Transportation needs    Medical: Not on file    Non-medical: Not on file  Tobacco Use  . Smoking status: Former Smoker    Packs/day: 0.80    Years: 10.00    Pack years: 8.00    Types: Cigarettes    Quit date: 07/11/1974    Years since quitting: 44.8  . Smokeless tobacco: Never Used  Substance and Sexual Activity  . Alcohol use: Yes    Alcohol/week: 7.0 standard drinks    Types: 7 Standard drinks or equivalent per week    Comment: 1 glass of red wine nightly and socially  . Drug use: No  . Sexual activity: Yes     Partners: Male  Lifestyle  . Physical activity    Days per week: Not on file    Minutes per session: Not on file  . Stress: Not on file  Relationships  . Social Herbalist on phone: Not on file    Gets together: Not on file    Attends religious service: Not on file    Active member of club or organization: Not on file    Attends meetings of clubs or organizations: Not on file    Relationship status: Not on file  . Intimate partner violence    Fear of current or ex partner: Not on file    Emotionally abused: Not on file    Physically abused: Not on file    Forced sexual activity: Not on file  Other Topics Concern  . Not on file  Social History Narrative   Retired Pharmacist, hospital    hh of 1   Now has puppy    Quit smoking 30 + years ago. 20 year pack year smoking hx   Regular exercise- some  treadmill    8 hours sleep         Caffeine use: 1-2 cups of coffee/[employer] instant   Husband dx glioblastoma fall 15   Passed away fall 09-26-14    Review of Systems: 12 system ROS is negative except as noted above with the additions of arthritis, back pain, and shortness of breath on exertion.  Filed Weights   04/25/19 1041  Weight: 163 lb (73.9 kg)    Physical Exam: Vital signs were reviewed. General:   Alert, well-nourished, pleasant and cooperative in NAD Head:  Normocephalic and atraumatic. Eyes:  Sclera clear, no icterus.   Conjunctiva pink. Mouth:  No deformity or lesions.   Neck:  Supple; no thyromegaly. Lungs:  Clear throughout to auscultation.   No wheezes.  Heart:  Regular rate and rhythm; no murmurs Abdomen:  Soft, nontender, normal bowel sounds. No rebound or guarding. No hepatosplenomegaly Rectal:  No chemical dermatitis. No external hemorrhoids, fissure or fistula. No prolapsing hemorrhoids.  No rectal prolapse. Normal anocutaneous reflex. No stool in the rectal vault. No mass or fecal impaction. Normal anal resting tone. Chaperone: Brooke Msk:  Symmetrical without  gross deformities. Extremities:  No gross deformities or edema. Neurologic:  Alert and  oriented x4;  grossly nonfocal Skin:  No rash or bruise. Psych:  Alert and cooperative. Normal mood and affect.   Kedrick Mcnamee L. Tarri Glenn, MD, MPH [facility] 04/25/2019, 10:53 AM

## 2019-04-25 NOTE — Patient Instructions (Signed)
-  Minimize alcohol use, as this may be increasing the enzymes  - Please have fasting labs done.   - You have been scheduled for an abdominal ultrasound at [facility] (1st floor of hospital) on 05-03-2019 at 8:00 am. Please arrive 15 minutes prior to your appointment for registration. Make certain not to have anything to eat or drink 6 hours prior to your appointment. Should you need to reschedule your appointment, please contact radiology at [phone]. This test typically takes about 30 minutes to perform.  - Follow-up in 2 months

## 2019-04-29 ENCOUNTER — Telehealth: Payer: Self-pay | Admitting: Neurology

## 2019-04-29 NOTE — Telephone Encounter (Signed)
Pt called and wanted to discuss with the RN about the ALPRAZolam Duanne Moron) 0.5 MG tablet that was prescribed to her. She would like to clear up a confusion she has on when to take it. Please advise.

## 2019-04-29 NOTE — Telephone Encounter (Signed)
Called the patient to advise that the medication was ordered for the patient to take only if needed in the sleep lab. The patient was confused because she received 14 tablets of the medication. Advised the patient the main purpose is to only take the medication for the sleep study. I also advised the patient to bring the medication with her to the sleep study and only take it if needed. She can discuss with the sleep tech ahead of time that night and they can decide on what time the patient should take the medication. Pt verbalized understanding and was appreciative for the call back.

## 2019-04-30 ENCOUNTER — Other Ambulatory Visit: Payer: Self-pay

## 2019-04-30 ENCOUNTER — Ambulatory Visit
Admission: RE | Admit: 2019-04-30 | Discharge: 2019-04-30 | Disposition: A | Discharge status: Home / Self Care | Payer: Medicare Other | Source: Ambulatory Visit | Attending: Internal Medicine | Admitting: Internal Medicine

## 2019-04-30 DIAGNOSIS — Z1231 Encounter for screening mammogram for malignant neoplasm of breast: Secondary | ICD-10-CM

## 2019-05-03 ENCOUNTER — Ambulatory Visit (HOSPITAL_COMMUNITY)
Admission: RE | Admit: 2019-05-03 | Discharge: 2019-05-03 | Disposition: A | Discharge status: Home / Self Care | Payer: Medicare Other | Source: Ambulatory Visit | Attending: Gastroenterology | Admitting: Gastroenterology

## 2019-05-03 ENCOUNTER — Other Ambulatory Visit: Payer: Self-pay

## 2019-05-03 DIAGNOSIS — R748 Abnormal levels of other serum enzymes: Secondary | ICD-10-CM | POA: Diagnosis present

## 2019-05-14 ENCOUNTER — Other Ambulatory Visit (INDEPENDENT_AMBULATORY_CARE_PROVIDER_SITE_OTHER): Payer: Medicare Other

## 2019-05-14 DIAGNOSIS — R748 Abnormal levels of other serum enzymes: Secondary | ICD-10-CM | POA: Diagnosis not present

## 2019-05-14 LAB — IRON: Iron: 105 ug/dL (ref 42–145)

## 2019-05-14 LAB — FERRITIN: Ferritin: 77.3 ng/mL (ref 10.0–291.0)

## 2019-05-14 LAB — GLUCOSE, RANDOM: Glucose, Bld: 87 mg/dL (ref 70–99)

## 2019-05-20 ENCOUNTER — Other Ambulatory Visit: Payer: Self-pay | Admitting: *Deleted

## 2019-05-20 DIAGNOSIS — J4540 Moderate persistent asthma, uncomplicated: Secondary | ICD-10-CM

## 2019-05-20 DIAGNOSIS — R918 Other nonspecific abnormal finding of lung field: Secondary | ICD-10-CM

## 2019-05-20 LAB — IGG, IGA, IGM
IgG (Immunoglobin G), Serum: 881 mg/dL (ref 600–1540)
IgM, Serum: 66 mg/dL (ref 50–300)
Immunoglobulin A: 263 mg/dL (ref 70–320)

## 2019-05-20 LAB — MITOCHONDRIAL ANTIBODIES: Mitochondrial M2 Ab, IgG: <=20 U

## 2019-05-20 LAB — ANA: Anti Nuclear Antibody (ANA): NEGATIVE

## 2019-05-20 LAB — HEPATITIS B SURFACE ANTIGEN: Hepatitis B Surface Ag: NONREACTIVE

## 2019-05-20 LAB — ANTI-SMOOTH MUSCLE ANTIBODY, IGG: Actin (Smooth Muscle) Antibody (IGG): <20 U (ref <20)

## 2019-05-20 LAB — HEPATITIS B CORE ANTIBODY, TOTAL: Hep B Core Total Ab: NONREACTIVE

## 2019-05-20 MED ORDER — SPIRIVA RESPIMAT 1.25 MCG/ACT IN AERS: 2.0000 | INHALATION_SPRAY | Freq: Every day | RESPIRATORY_TRACT | 3 refills | Status: DC

## 2019-05-22 LAB — INSULIN, FREE AND TOTAL
Free Insulin: 26 uU/mL — ABNORMAL HIGH
Total Insulin: 26 uU/mL

## 2019-06-07 ENCOUNTER — Other Ambulatory Visit: Payer: Self-pay

## 2019-06-07 ENCOUNTER — Ambulatory Visit (INDEPENDENT_AMBULATORY_CARE_PROVIDER_SITE_OTHER): Payer: Medicare Other | Admitting: Neurology

## 2019-06-07 DIAGNOSIS — R0602 Shortness of breath: Secondary | ICD-10-CM

## 2019-06-07 DIAGNOSIS — I2510 Atherosclerotic heart disease of native coronary artery without angina pectoris: Secondary | ICD-10-CM

## 2019-06-07 DIAGNOSIS — J449 Chronic obstructive pulmonary disease, unspecified: Secondary | ICD-10-CM

## 2019-06-07 DIAGNOSIS — R350 Frequency of micturition: Secondary | ICD-10-CM

## 2019-06-07 DIAGNOSIS — G4750 Parasomnia, unspecified: Secondary | ICD-10-CM

## 2019-06-07 DIAGNOSIS — R0683 Snoring: Secondary | ICD-10-CM

## 2019-06-07 DIAGNOSIS — R4189 Other symptoms and signs involving cognitive functions and awareness: Secondary | ICD-10-CM

## 2019-06-15 NOTE — Procedures (Signed)
PATIENT'S NAME:  Jasmine Neal, Jasmine Neal DOB:      1945/07/21      MR#:    [id number]     DATE OF RECORDING: 06/07/2019 REFERRING M.D.:  Shanon Ace, MD Study Performed:   REM behavior montage EEG/ Polysomnogram HISTORY:  Jasmine Neal is a 73- year -old -r Caucasian widowed female patient seen here on 04/22/2019 for a sleep evaluation.  Chief concern according to patient: Mrs. Steinbrecher  states that after her husband death , she has no longer any witness to her sleep . When he was alive he would talk about her "moving in sleep and laughing in sleep".  She feels she is acting out her dreams and one time she fell out of bed and another time she dove out of bed, injuring herself both times.   The patient endorsed the Epworth Sleepiness Scale at 3 points.   The patient's weight 162 pounds with a height of 69 (inches), resulting in a BMI of 23.8 kg/m2. The patient's neck circumference measured 14 inches.  CURRENT MEDICATIONS: Tylenol, Lipitor, Breo, Vitamin D, Tylenol PM, Magnesium, Myrbetriq, Paxil, Synthroid, Spiriva Respimat, Valtrex ( we discussed that 3 of her medications can o worsen sleep muscle tone, and PLMs)    PROCEDURE:  This is a multichannel digital polysomnogram utilizing the Somnostar 11.2 system.  Electrodes and sensors were applied and monitored per AASM Specifications.   EEG, EOG, Chin and Limb EMG, were sampled at 200 Hz.  ECG, Snore and Nasal Pressure, Thermal Airflow, Respiratory Effort, CPAP Flow and Pressure, Oximetry was sampled at 50 Hz. Digital video and audio were recorded.      BASELINE STUDYLights Out was at 22:52 and Lights On at 04:54.  Total recording time (TRT) was 362 minutes, with a total sleep time (TST) of 321.5 minutes.   The patient's sleep latency was 17.5 minutes.  REM latency was 115 minutes.  The sleep efficiency was 88.8 %.     SLEEP ARCHITECTURE: WASO (Wake after sleep onset) was 22.5 minutes.  There were 19.5 minutes in Stage N1, 172 minutes Stage N2, 48 minutes  Stage N3 and 82 minutes in Stage REM.  The percentage of Stage N1 was 6.1%, Stage N2 was 53.5%, Stage N3 was 14.9% and Stage R (REM sleep) was 25.5%.   RESPIRATORY ANALYSIS:  There were a total of 0 respiratory events. The patient also had 0 respiratory event related arousals (RERAs).     The total APNEA/HYPOPNEA INDEX (AHI) was 0 /hour and the total RESPIRATORY DISTURBANCE INDEX was 0. 0 /hour.  The patient spent 20 minutes of total sleep time in the supine position and 302 minutes in non-supine.  OXYGEN SATURATION & C02:  The Wake baseline 02 saturation was 94%, with the lowest being 89%.    The patient had a total of 0 Periodic Limb Movements.  The arousals were noted as: 29 were spontaneous, 0 were associated with PLMs, and 0 were associated with respiratory events.   Audio and video analysis did not show any abnormal or unusual movements, behaviors, phonations or vocalizations.  During REM sleep there was an elevated muscle tone noted, bit no complex behaviors were seen.   The patient took 2 bathroom breaks. Very mild and non-continuous Snoring was noted. EKG was in keeping with normal sinus rhythm (NSR).   Post-study, the patient indicated that sleep was the same as usual.    IMPRESSION:  1. Failure of relaxation during REM sleep. Not associated with arousal from sleep during this  recording but suspicious for REM SLEEP BEHAVIOR.    RECOMMENDATIONS:  I advise to wean off paroxetine and rather use Zoloft or Lexapro as a sleep and mood aid, or for climatarian symptoms. Melatonin may help to suppress REM muscle tone abnormalities and resulting REM sleep behaviors. Benadryl can worsen restless legs and movements, as can other anticholinergica , such as Myrbetriq.  Please follow up with our NP or me and keep a sleep diary.  I certify that I have reviewed the entire raw data recording prior to the issuance of this report in accordance with the Standards of Accreditation of the American  Academy of Sleep Medicine (AASM)    Larey Seat, MD  06-15-2019 Diplomat, American Board of Psychiatry and Neurology  Diplomat, American Board of [place] Director, [employer] [facility] [employer]

## 2019-06-18 ENCOUNTER — Telehealth: Payer: Self-pay | Admitting: Neurology

## 2019-06-18 NOTE — Telephone Encounter (Signed)
-----   Message from Larey Seat, MD sent at 06/15/2019  6:03 PM EST ----- Audio and video analysis did not show any abnormal or unusual movements, behaviors, phonations or vocalizations.  During REM sleep there was an elevated muscle tone noted, but no complex behaviors were seen.  The patient took 2 bathroom breaks. Very mild and non-continuous Snoring was noted. EKG was in keeping with normal sinus rhythm (NSR).    IMPRESSION:  1. Failure of relaxation during REM sleep. Not associated with arousal from sleep during this recording but suspicious for REM SLEEP BEHAVIOR.    RECOMMENDATIONS:  I advise to wean off paroxetine and rather use Zoloft or Lexapro as a sleep and mood aid, or for climatarian symptoms. Melatonin may help to suppress REM muscle tone abnormalities and resulting REM sleep behaviors. Benadryl can worsen restless legs and movements, as can other anticholinergica , such as Myrbetriq.  Please follow up with our NP or me and keep a sleep diary.

## 2019-06-18 NOTE — Telephone Encounter (Signed)
Pt called me to review the sleep study results. Advised the patient of the findings. We reviewed Dr Dohmeier's recommendation. I informed her for treatment of REM BD she would recommend melatonin. Advised of getting this over the counter and the different strengths. Advised the patient of other recommendations made by Dr Brett Fairy. Pt verbalized understanding. Scheduled a follow up visit in Feb to allow the patient time to use the medication and keep a sleep journal. Pt verbalized understanding. Pt had no questions at this time but was encouraged to call back if questions arise.

## 2019-06-25 ENCOUNTER — Other Ambulatory Visit: Payer: Self-pay

## 2019-06-25 ENCOUNTER — Ambulatory Visit (INDEPENDENT_AMBULATORY_CARE_PROVIDER_SITE_OTHER)
Admission: RE | Admit: 2019-06-25 | Discharge: 2019-06-25 | Disposition: A | Discharge status: Home / Self Care | Payer: Medicare Other | Source: Ambulatory Visit | Attending: Internal Medicine | Admitting: Internal Medicine

## 2019-06-25 DIAGNOSIS — J4540 Moderate persistent asthma, uncomplicated: Secondary | ICD-10-CM

## 2019-06-25 DIAGNOSIS — R918 Other nonspecific abnormal finding of lung field: Secondary | ICD-10-CM | POA: Diagnosis not present

## 2019-06-26 ENCOUNTER — Telehealth: Payer: Self-pay | Admitting: Internal Medicine

## 2019-06-26 NOTE — Telephone Encounter (Signed)
Left message for the patient to call back to scheduled follow up with Dr. Tarri Glenn.

## 2019-06-26 NOTE — Telephone Encounter (Signed)
Called and spoke with pt letting her know the results of the CT and stated to her that MR wanted her to have a referral to GI.  Pt said that she already sees GI, Dr. Thornton Park. I stated to pt that I would send this to her so she could further review the CT to see if she wanted to change anything with her plan of care and she verbalized understanding.  Dr. Leigh Aurora, please see pt's CT which was performed 12/15 to see if you want to change anything with pt's current plan of care.

## 2019-06-26 NOTE — Telephone Encounter (Signed)
Please offer her a follow-up with me or APP to review these findings and to determine next steps. Thank you.

## 2019-06-26 NOTE — Telephone Encounter (Signed)
Ct chest   - stable lung nodules   - the new finding compared to 2019 and July 2020 is that the radiologist thinks that her food piple is slow and she has evidence of acid reflux. Not sure if this something that is really new or something this radiologist feels is different.   Plan  - refer gI  - can come and visit me for face to face in [facility] 2021 or sooner if she wants     SIGNATURE    Dr. Brand Males, M.D., F.C.C.P,  Pulmonary and Critical Care Medicine Staff Physician, [place] Director - Interstitial Lung Disease  Program  Pulmonary [place] at [place], [place], [address]  Pager: [phone], If no answer or between  15:00h - 7:00h: call [phone] Telephone: [phone]  10:37 AM 06/26/2019    CT Chest Wo Contrast  Result Date: 06/25/2019 CLINICAL DATA:  Follow-up of lung nodule. History of asthma. Breast cancer. EXAM: CT CHEST WITHOUT CONTRAST TECHNIQUE: Multidetector CT imaging of the chest was performed following the standard protocol without IV contrast. COMPARISON:  01/30/2019 FINDINGS: Cardiovascular: Normal caliber of the aorta and branch vessels. Normal heart size, without pericardial effusion. Lad coronary artery atherosclerosis. Mild lower thoracic aortic atherosclerosis. Mediastinum/Nodes: Right axillary node dissection. No axillary adenopathy. No mediastinal or definite hilar adenopathy, given limitations of unenhanced CT. Subtle fluid level in the esophagus on 60/2. Lungs/Pleura: No pleural fluid. Mild centrilobular emphysema. Anterior right upper lobe radiation fibrosis. Multiple bilateral pulmonary nodules, many of which are identified on series 3. Index right apical pulmonary nodule is similar at 7 mm on 29/3. Index left lower lobe pulmonary nodule is similar at 5 mm on 113/3. Upper Abdomen: Normal imaged portions of the liver, spleen, stomach, pancreas, gallbladder, adrenal glands,  kidneys. Musculoskeletal: Presumed sebaceous cyst about the posterolateral the left chest wall, including at 1.2 cm. Moderate thoracic spondylosis. IMPRESSION: 1. Ongoing stability of bilateral pulmonary nodules, most consistent favoring a benign etiology. 2. Right axillary node dissection, without findings of thoracic metastatic disease. 3. Aortic atherosclerosis (ICD10-I70.0), coronary artery atherosclerosis and emphysema (ICD10-J43.9). 4. Esophageal air fluid level suggests dysmotility or gastroesophageal reflux. Electronically Signed   By: Abigail Miyamoto M.D.   On: 06/25/2019 13:32

## 2019-06-27 ENCOUNTER — Encounter: Payer: Self-pay | Admitting: *Deleted

## 2019-06-27 NOTE — Telephone Encounter (Signed)
Patient scheduled for f/u 07/22/19 at 3:00 pm.

## 2019-07-22 ENCOUNTER — Encounter: Payer: Self-pay | Admitting: Gastroenterology

## 2019-07-22 ENCOUNTER — Ambulatory Visit (INDEPENDENT_AMBULATORY_CARE_PROVIDER_SITE_OTHER): Payer: Medicare PPO | Admitting: Gastroenterology

## 2019-07-22 ENCOUNTER — Other Ambulatory Visit (INDEPENDENT_AMBULATORY_CARE_PROVIDER_SITE_OTHER): Payer: Medicare PPO

## 2019-07-22 VITALS — BP 140/78 | HR 77 | Temp 98.4°F | Ht 69.0 in | Wt 162.0 lb

## 2019-07-22 DIAGNOSIS — R748 Abnormal levels of other serum enzymes: Secondary | ICD-10-CM

## 2019-07-22 DIAGNOSIS — R933 Abnormal findings on diagnostic imaging of other parts of digestive tract: Secondary | ICD-10-CM

## 2019-07-22 DIAGNOSIS — Z01818 Encounter for other preprocedural examination: Secondary | ICD-10-CM

## 2019-07-22 LAB — HEPATIC FUNCTION PANEL
ALT: 21 U/L (ref 0–35)
AST: 20 U/L (ref 0–37)
Albumin: 4.4 g/dL (ref 3.5–5.2)
Alkaline Phosphatase: 89 U/L (ref 39–117)
Bilirubin, Direct: 0.1 mg/dL (ref 0.0–0.3)
Total Bilirubin: 0.4 mg/dL (ref 0.2–1.2)
Total Protein: 7.1 g/dL (ref 6.0–8.3)

## 2019-07-22 MED ORDER — SUPREP BOWEL PREP KIT 17.5-3.13-1.6 GM/177ML PO SOLN: 1.0000 | ORAL | 0 refills | Status: DC

## 2019-07-22 NOTE — Progress Notes (Addendum)
Referring Provider: Burnis Medin, MD Primary Care Physician:  Burnis Medin, MD   Chief complaint:  CT scan results   IMPRESSION:  Abnormal esophageal findings on CT scan    - CT of the chest 06/25/2019: esophageal air-fluid levels suggesting dysmotility or esophageal reflux    - No associated symptoms History of abnormal liver enzymes    - isolated ALT 37 02/2016, 102 03/2016    - all repeat testing has been normal until 03/2019    - ultrasound and MRI 2017 were essentially normal    - hepatitis C antibody negative 09/11/2012    - ANA negative 05/15/2016    - celiac disease serology 08/30/16: TTGA negative, IgA 206    - 5' nucleotidase 11 (normal 0-10)    - ongoing alcohol use HOMA-IR of 5.6 Buttock, groin, rectum and thigh pain of unclear etiology    - normal rectum exam without reproduction of symptoms Concerns for hemorrhoids - without any noted on physical exam or prior colonoscopy reports History of colon polyp 2014    - colonoscopy with Dr. Allyn Kenner    - pathology results not known    - no polyps on colonoscopy 2000    [facility] suggested follow-up colonoscopy in 2019-2024 Diverticulosis without history of diverticulitis  Recent CT of the chest suggests the possibility of dysmotility or reflux. Jasmine Neal any associated symptoms and is not eager to pursue endoscopic evaluation without them.   Recent labs show normalizing in her mildly elevated transaminases. Likely due to alcohol given negative serologic evaluation for other common causes of liver disease.  Elastography showed no significant advanced fibrosis. Will plan repeat liver enzymes today and FibroSURE to support the elastography findings. If reassuring, will plan annual follow-up.  She has a history of colon polyps. Dr. Allyn Kenner recommended a colonoscopy between 2019-2024. The patient may schedule at her convenience.     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 Chase Caller for abnormal CT scan and also here to follow-up on abnormal liver enzymes. She was last seen by me 04/25/19. The interval history is obtained through the patient and review of her electronic health record. Previously under the care of Dr. Allyn Kenner. History of anxiety, arthritis, asthma, breast cancer s/p lumpectomy 1990, diverticulosis, hypercholesterolemia, and hypothyroidism.  History of elevated liver enzymes. Elevated several years ago and again last year. Normal when checked earlier this year. Previously evaluated by Dr. Allyn Kenner, most recently in 2018. Told she had fatty liver. Also suggested celiac, but, testing for that was negative. Liver enzymes are now normal again.  Ultrasound and MRI at that time were normal. No known family history of liver disease. No risk factors for chronic viral hepatitis. Prior screening for HCV was negative. No history of jaundice or scleral icterus.   Review of EPIC shows an isolated ALT 37 02/2016 and  ALT 102 03/2016. They have been normal since that time until September.  Recent labs 03/12/19: AST 42, ALT 46, alk phos 96, TB 0.5. Labs 05/14/2019 show a glucose of 87, iron 105, ferritin 77, IgG 881, IgM 66, ANA negative, IgA 263, AMA less than 20, hep B surface antigen negative hep B core antibody total nonreactive, insulin 26, F-actin less than  20.  Drinking one glass of red wine with dinner historically. Now drinking two glasses of wine with changes attributed to coronavirus. No symptoms that she associates with elevated liver enzymes.   Elastography 05/03/2019 showed a normal sonographic appearance of the liver.  The medium kPa was 3.4. This has a high probability of being normal without advanced liver disease or fibrosis noted.  CT of the  chest 06/25/2019 showed stable pulmonary nodules, aortic atherosclerosis, and esophageal air-fluid levels suggesting dysmotility or esophageal reflux.  She has no symptoms attributed to the CT scan findings. No reflux, heartburn, dysphagia, odynophagia, dysphonia, sore throat, neck pain.   Prior endoscopy history: Colonoscopy with Dr. Allyn Kenner 2000: diverticulosis. No hemorrhoids mentioned.  Colonoscopy with Dr. Allyn Kenner 12/28/00: Normal, repeat in 5 years given history of breast cancer Colonoscopy with Dr. Allyn Kenner 2014: small polyp, diverticulosis. No hemorrhoids mentioned.  No prior upper endoscopy  Prior abdominal imaging: - Abdominal ultrasound 04/11/16: heterogeneous hepatic echotexture without discrete mass. No gallstones.  - MRI 05/10/16: no steatosis; slightly reduced in phase parenchymal signal intensity in the liver - ? Hemochromatosis although this is not supported by other sequences - Elastography 05/03/2019:  normal sonographic appearance of the liver.  The medium kPa was 3.4. This has a high probability of being normal without advanced liver disease or fibrosis noted.  - CT of the chest 06/25/2019: stable pulmonary nodules, aortic atherosclerosis, and esophageal air-fluid levels suggesting dysmotility or esophageal reflux.   Past Medical History:  Diagnosis Date  . Anxiety   . Asthma    evalutated by pulmonary severe by spirometry no smoker currently  . Breast cancer ([place])    hx of right lumpectomy, 1990 IIA radiation cmfp ajunct rx  . Chronic airway obstruction, not elsewhere classified   . Coronary artery calcification seen on CAT scan 01/23/2018  . Diverticulosis    only has like 2  . Dysplasia    hx treated with cerv conization  . Fatty liver    US done 2010  . Fibromyalgia   . Hx of nonmelanoma skin cancer     followed dr [place]   . Hyperlipidemia   . Hypothyroidism   . Malignant neoplasm of breast (female), unspecified site 11/28/2013  . Osteoarthritis   . Skin  cancer    squamous cell    Past Surgical History:  Procedure Laterality Date  . BREAST LUMPECTOMY     on right, 1990 IIA radiation cmfp ajuction rx  . KNEE SURGERY  2012  . NASAL SINUS SURGERY  2007   on right done by Dr. Wilburn Cornelia    Current Outpatient Medications  Medication Sig Dispense Refill  . acetaminophen (TYLENOL) 500 MG tablet Take 1,000 mg by mouth daily as needed. pain     . ALPRAZolam (XANAX) 0.5 MG tablet Take 1 tablet (0.5 mg total) by mouth at bedtime as needed for anxiety. (Patient not taking: Reported on 04/25/2019) 14 tablet 0  . atorvastatin (LIPITOR) 40 MG tablet Take 1 tablet (40 mg total) by mouth daily. (Patient taking differently: Take 20 mg by mouth daily. ) 90 tablet 3  . BREO ELLIPTA 100-25 MCG/INH AEPB USE 1 INHALATION BY MOUTH  DAILY 180 each 3  . Cholecalciferol (VITAMIN D3) 50 MCG (2000 UT) TABS Take by mouth daily.    . diphenhydramine-acetaminophen (TYLENOL PM) 25-500 MG TABS Take 1 tablet by mouth at bedtime as needed. sleep    . mirabegron ER (MYRBETRIQ) 25 MG TB24 tablet Take 1 tablet (25 mg total) by mouth  daily. May increase to 50 mg per day if needed (Patient not taking: Reported on 04/25/2019) 30 tablet 1  . PARoxetine (PAXIL) 40 MG tablet TAKE 1 TABLET BY MOUTH AT  BEDTIME (Patient taking differently: 1/2 tablet every other day, tapering to get off) 90 tablet 3  . SYNTHROID 88 MCG tablet Take 1 tablet (88 mcg total) by mouth daily. 90 tablet 3  . Tiotropium Bromide Monohydrate (SPIRIVA RESPIMAT) 1.25 MCG/ACT AERS Inhale 2 puffs into the lungs daily. 12 g 3  . valACYclovir (VALTREX) 1000 MG tablet TAKE 2 TABLETS BY MOUTH TWICE DAILY AS DIRECTED (Patient taking differently: TAKE 2 TABLETS BY MOUTH TWICE DAILY AS DIRECTED as needed for cold sores) 90 tablet prn  . vitamin B-12 (CYANOCOBALAMIN) 1000 MCG tablet Take 1,000 mcg by mouth daily.     No current facility-administered medications for this visit.    Allergies as of 07/22/2019 - Review  Complete 04/25/2019  Allergen Reaction Noted  . Hydrocodone Itching 11/08/2010  . Hydrocodone-acetaminophen Other (See Comments) 02/01/2016    Family History  Problem Relation Age of Onset  . Other Father        mysthenia gravis  . Lung cancer Sister   . Osteoporosis Other   . Thyroid disease Other   . Breast cancer Mother 55       possible uterine cancer diagnosis at age 32  . Osteoporosis Mother        no hip fracture  . Breast cancer Sister 50  . Lung cancer Sister 72       former smoker  . Lung cancer Paternal Grandmother   . Colon cancer Neg Hx   . Esophageal cancer Neg Hx     Social History   Socioeconomic History  . Marital status: Widowed    Spouse name: Not on file  . Number of children: 1  . Years of education: Not on file  . Highest education level: Not on file  Occupational History  . Occupation: Retired Pharmacist, hospital  Tobacco Use  . Smoking status: Former Smoker    Packs/day: 0.80    Years: 10.00    Pack years: 8.00    Types: Cigarettes    Quit date: 07/11/1974    Years since quitting: 45.0  . Smokeless tobacco: Never Used  Substance and Sexual Activity  . Alcohol use: Yes    Alcohol/week: 7.0 standard drinks    Types: 7 Standard drinks or equivalent per week    Comment: 1 glass of red wine nightly and socially  . Drug use: No  . Sexual activity: Yes    Partners: Male  Other Topics Concern  . Not on file  Social History Narrative   Retired Pharmacist, hospital    hh of 1   Now has puppy    Quit smoking 30 + years ago. 20 year pack year smoking hx   Regular exercise- some  treadmill    8 hours sleep         Caffeine use: 1-2 cups of coffee/[employer] instant   Husband dx glioblastoma fall 15   Passed away fall 09/24/2014   Social Determinants of Health   Financial Resource Strain:   . Difficulty of Paying Living Expenses: Not on file  Food Insecurity:   . Worried About Charity fundraiser in the Last Year: Not on file  . Ran Out of Food in the Last Year: Not on  file  Transportation Needs:   . Lack of Transportation (Medical): Not on file  .  Lack of Transportation (Non-Medical): Not on file  Physical Activity:   . Days of Exercise per Week: Not on file  . Minutes of Exercise per Session: Not on file  Stress:   . Feeling of Stress : Not on file  Social Connections:   . Frequency of Communication with Friends and Family: Not on file  . Frequency of Social Gatherings with Friends and Family: Not on file  . Attends Religious Services: Not on file  . Active Member of Clubs or Organizations: Not on file  . Attends Archivist Meetings: Not on file  . Marital Status: Not on file  Intimate Partner Violence:   . Fear of Current or Ex-Partner: Not on file  . Emotionally Abused: Not on file  . Physically Abused: Not on file  . Sexually Abused: Not on file     Physical Exam: Vital signs were reviewed. General:   Alert, well-nourished, pleasant and cooperative in NAD Abdomen:  Soft, nontender, normal bowel sounds. No rebound or guarding. No hepatosplenomegaly Neurologic:  Alert and  oriented x4;  grossly nonfocal Skin:  No rash or bruise. Psych:  Alert and cooperative. Normal mood and affect.   Tashina Credit L. Tarri Glenn, MD, MPH [facility] 07/22/2019, 1:09 PM

## 2019-07-22 NOTE — Patient Instructions (Addendum)
We would want to pursue further evaluation if you have any trouble with your esophagus. This could be chest pain, pain or difficulty swallowing, a change in your voice,  heartburn, sore throat, abdominal pain, nausea, vomiting, regurgitation, or the sensation of a lump in your throat. Please call if if any of these symptoms develop.   Given your history of abnormal liver enzymes: Abstain from all alcohol including beer, wine, liquor, and non-alcoholic beer. Consider drinking 2-3 9 ounce cups of regular brewed coffee every day. Although limit your daily caffeine intake to no more than 400 mg.  Improve sleep hygiene to match sleep and wake times during workdays and weekends.  Sleep at least 7-9 hours every night. Use blackout curtains, turn off lights at bedtime, and limit nighttime use of electronic devices and caffeine.   It is time to proceed with a follow-up colonoscopy. We can schedule this at your convenience.   Tips for colonoscopy:  - Stay well hydrated for 3-4 days prior to the exam. This reduces nausea and dehydration.  - To prevent skin/hemorrhoid irritation - prior to wiping, put A&Dointment or vaseline on the toilet paper. - Keep a towel or pad on the bed.  - Drink  64oz of clear liquids in the morning of prep day (prior to starting the prep) to be sure that there is enough fluid to flush the colon and stay hydrated!!!! This is in addition to the fluids required for preparation. - Use of a flavored hard candy, such as grape Anise Salvo, can counteract some of the flavor of the prep and may prevent some nausea.   If you are age 61 or older, your body mass index should be between 23-30. Your Body mass index is 23.92 kg/m. If this is out of the aforementioned range listed, please consider follow up with your Primary Care Provider.  If you are age 52 or younger, your body mass index should be between 19-25. Your Body mass index is 23.92 kg/m. If this is out of the aformentioned range  listed, please consider follow up with your Primary Care Provider.   Your provider has requested that you go to the basement level for lab work before leaving today. Press "B" on the elevator. The lab is located at the first door on the left as you exit the elevator.   Due to recent changes in healthcare laws, you may see the results of your imaging and laboratory studies on MyChart before your provider has had a chance to review them.  We understand that in some cases there may be results that are confusing or concerning to you. Not all laboratory results come back in the same time frame and the provider may be waiting for multiple results in order to interpret others.  Please give [place] 48 hours in order for your provider to thoroughly review all the results before contacting the office for clarification of your results.

## 2019-07-23 ENCOUNTER — Telehealth: Payer: Self-pay | Admitting: Internal Medicine

## 2019-07-23 DIAGNOSIS — J4540 Moderate persistent asthma, uncomplicated: Secondary | ICD-10-CM

## 2019-07-23 DIAGNOSIS — R918 Other nonspecific abnormal finding of lung field: Secondary | ICD-10-CM

## 2019-07-23 MED ORDER — BREO ELLIPTA 100-25 MCG/INH IN AEPB: 1.0000 | INHALATION_SPRAY | Freq: Every day | RESPIRATORY_TRACT | 3 refills | Status: DC

## 2019-07-23 MED ORDER — SPIRIVA RESPIMAT 1.25 MCG/ACT IN AERS: 2.0000 | INHALATION_SPRAY | Freq: Every day | RESPIRATORY_TRACT | 3 refills | Status: DC

## 2019-07-23 NOTE — Telephone Encounter (Signed)
See my chart message

## 2019-07-23 NOTE — Telephone Encounter (Signed)
atorvastatin (LIPITOR) 40 MG tablet     Patient requesting call back from CMA to discuss this medication.

## 2019-07-23 NOTE — Telephone Encounter (Signed)
Pt states that breaking the atorvastatin to 20 mg is working and is requesting 20 mg to be sent in okay to do so ?

## 2019-07-23 NOTE — Telephone Encounter (Signed)
Message Routed to PCP CMA 

## 2019-07-23 NOTE — Telephone Encounter (Signed)
This medication was prescribed by dr Orlean Patten  Not sure what the message means   I dont see a recetn lipid panel Please forward this message to Dr Meda Coffee the prescribing physician

## 2019-07-24 LAB — NASH FIBROSURE
ALPHA 2-MACROGLOBULINS, QN: 174 mg/dL (ref 110–276)
ALT (SGPT) P5P: 32 IU/L (ref 0–40)
AST (SGOT) P5P: 26 IU/L (ref 0–40)
Apolipoprotein A-1: 174 mg/dL (ref 116–209)
Bilirubin, Total: 0.3 mg/dL (ref 0.0–1.2)
Cholesterol, Total: 163 mg/dL (ref 100–199)
Fibrosis Score: 0.32 — ABNORMAL HIGH (ref 0.00–0.21)
GGT: 138 IU/L — ABNORMAL HIGH (ref 0–60)
Glucose: 89 mg/dL (ref 65–99)
Haptoglobin: 64 mg/dL (ref 42–346)
Height: 69 in
NASH Score: 0.25
Steatosis Score: 0.57 — ABNORMAL HIGH (ref 0.00–0.30)
Triglycerides: 113 mg/dL (ref 0–149)
Weight: 162 [lb_av]

## 2019-07-24 MED ORDER — PAROXETINE HCL 40 MG PO TABS: 40.0000 mg | ORAL_TABLET | Freq: Every day | ORAL | 0 refills | Status: DC

## 2019-07-24 MED ORDER — SYNTHROID 88 MCG PO TABS: 88.0000 ug | ORAL_TABLET | Freq: Every day | ORAL | 0 refills | Status: DC

## 2019-07-26 MED ORDER — ATORVASTATIN CALCIUM 20 MG PO TABS: 20.0000 mg | ORAL_TABLET | Freq: Every day | ORAL | 2 refills | Status: DC

## 2019-07-26 NOTE — Addendum Note (Signed)
Addended by: Modena Morrow R on: 07/26/2019 09:37 AM   Modules accepted: Orders

## 2019-07-26 NOTE — Telephone Encounter (Signed)
Sounds good ok to  .take over the medication   Please send in  rx the med but send in  The 20 mg  If that is what she is taking 90 days   X 2

## 2019-07-29 ENCOUNTER — Telehealth: Payer: Self-pay

## 2019-07-29 ENCOUNTER — Other Ambulatory Visit: Payer: Self-pay

## 2019-07-29 ENCOUNTER — Encounter: Payer: Self-pay | Admitting: Neurology

## 2019-07-29 ENCOUNTER — Ambulatory Visit: Payer: Medicare PPO | Admitting: Neurology

## 2019-07-29 VITALS — BP 115/62 | HR 78 | Temp 97.0°F | Ht 69.0 in | Wt 162.0 lb

## 2019-07-29 DIAGNOSIS — R4189 Other symptoms and signs involving cognitive functions and awareness: Secondary | ICD-10-CM

## 2019-07-29 DIAGNOSIS — G4750 Parasomnia, unspecified: Secondary | ICD-10-CM

## 2019-07-29 DIAGNOSIS — G4752 REM sleep behavior disorder: Secondary | ICD-10-CM | POA: Diagnosis not present

## 2019-07-29 MED ORDER — ALPRAZOLAM 0.25 MG PO TABS: 0.2500 mg | ORAL_TABLET | Freq: Every evening | ORAL | 0 refills | Status: DC | PRN

## 2019-07-29 NOTE — Telephone Encounter (Signed)
Called the patient and advised that I had opening this afternoon if she would like we can see her today that way can discuss concerns and follow up post sleep study. Pt verbalized understanding. Advised to arrive 2:30 for a 3 pm.

## 2019-07-29 NOTE — Progress Notes (Signed)
SLEEP MEDICINE CLINIC    Provider:  Larey Seat, MD  Primary Care Physician:  Burnis Medin, [place] [address]     Referring Provider: same          Chief Complaint according to patient   Patient presents with:    . New Patient (Initial Visit)           HISTORY OF PRESENT ILLNESS:  Jasmine Neal is a 74 y.o. year old White or Caucasian female patient seen here  on 07/29/2019  for a sleep evaluation.  Chief concern according to patient : "pt states when her husband was alive he would talk about her moving in sleep and laughing in sleep.  She presently she feels she is acting out her dreams. one time she fell out of bed and then other time she dove out ot bed, injuring herself both times".    I have the pleasure of seeing Jasmine Neal today, a right -handed widowed  Caucasian female with a possible sleep disorder.  Her husband died of Glioblastoma multiforme, was followed here.   She has a past medical history of Anxiety, Asthma, Breast cancer ([place]), Chronic airway obstruction, not elsewhere classified, Coronary artery calcification seen on CAT scan (01/23/2018), Diverticulosis, Dysplasia, Fatty liver, Fibromyalgia, nonmelanoma skin cancer, Hyperlipidemia, Hypothyroidism, Malignant neoplasm of breast (female), unspecified site (11/28/2013), Osteoarthritis, and Skin cancer. 6-8 years ago onset of Parasomnia, sleep laughing, new is falling out of BED.     Sleep relevant medical history: Nocturia 3 times , Sleep walking/ Parasomnia : yes.    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 She lives alone. Family status is widowed with adult daughter but no grandchildren. Pets are present. One dog.  Tobacco use: from age [age], 1/2 pack a day. ETOH use - 1-2 glasses of wine each night, 6-10 ounces. Caffeine intake in form of Coffee( 3 a day/ AM) Soda(none   ) Tea ( rarely) or energy drinks. Regular exercise in form of walking, biking.  Hobbies :social activities.     Sleep habits are as follows: The patient's dinner time is between 5.30-6 PM. The patient goes to bed at 11 PM reading for 30 minutes and falling asleep with Tylenol PM.. She continues to sleep for many hours, wakes for 3 bathroom breaks, goes right back to sleep.  The bedroom is cool, quiet and dark.  The preferred sleep position is lateral , with the support of 2 pillows.  Dreams are reportedly frequent/vivid.    7.30 AM is the usual rise time. The patient wakes up spontaneously at about 7.30 AM.   She reports mostly feeling refreshed or restored in AM, with symptoms such as dry mouth, morning achiness, no headaches. But some residual fatigue.  Naps are taken infrequently.  Rv from 07-29-2019, [name] has been for a sleep study on 06 June 2019 she had no apneas, the lowest oxygen saturation level was 89% at night nadir.  There was mild and not continuous snoring noted EKG was in sinus rhythm.  But she had a  failure of relaxation during REM sleep -she did continued to move during REM sleep which is not physiologically normal.  There was an elevated muscle tone noted throughout but no complex behaviors were seen.  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  Melatonin may also help to suppress REM sleep muscle tone.  Benadryl can actually worsen some PLM's.   I have today the chance to talk to [name] who feels that she had continued nocturnal activity and she has fallen at least herself is not excessively in the upper arm , bruised her hand,  and bruised her right face and her knees. She fell into furniture- hit her Hansel Feinstein- but can' t remember if she dreamed and what.   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 Another time she was in a mansion- was shown the bathroom and had enuresis- urinated in her bed.   Reportedly , her pulmonologist looked at her chest x ray and dx acid reflux?  Reportedly eher dentist dx an infect in her upper gums- not a generalized gingiva.   Review of Systems: Out of a complete 14 system review, the patient complains of only the following symptoms, and all other reviewed systems are negative.:  Mild Fatigue, mild sleepiness , not sure about snoring, fragmented sleep.PARASOMNIA , dream enactment.   REM BD    How likely are you to doze in the following situations: 0 = not likely, 1 = slight chance, 2 = moderate chance, 3 = high chance   Sitting and Reading? Watching Television? Sitting inactive in a public place (theater or meeting)? As a passenger in a car for an hour without a break? Lying down in the afternoon when circumstances permit? Sitting and talking to someone? Sitting quietly after lunch without alcohol? In a car, while stopped for a few minutes in traffic?   Total = 3/ 24 points   FSS endorsed at 18/ 63 points.   Social History   Socioeconomic History  . Marital status: Widowed    Spouse name: Not on file  . Number of children: 1  . Years of education: Not on file  . Highest education level: Not on file  Occupational History  . Occupation: Retired Pharmacist, hospital  Tobacco Use  . Smoking status: Former Smoker    Packs/day: 0.80    Years: 10.00    Pack years: 8.00    Types: Cigarettes    Quit date: 07/11/1974    Years since quitting: 45.0  . Smokeless tobacco: Never Used  Substance and Sexual Activity  . Alcohol use: Yes    Alcohol/week: 7.0 standard drinks    Types: 7 Standard drinks or equivalent per week    Comment: 1 glass of red wine nightly and socially  . Drug use: No  . Sexual activity: Yes    Partners: Male  Other Topics Concern  . Not on file  Social History Narrative   Retired  Pharmacist, hospital    hh of 1   Now has puppy    Quit smoking 30 + years ago. 20 year pack year smoking hx   Regular exercise- some  treadmill    8 hours sleep         Caffeine use: 1-2 cups of coffee/[employer] instant   Husband dx glioblastoma fall 15   Passed away fall 09/24/14   Social Determinants of Health   Financial Resource Strain:   . Difficulty of Paying Living Expenses: Not on file  Food Insecurity:   . Worried About Charity fundraiser in the Last Year: Not on file  . Ran Out  of Food in the Last Year: Not on file  Transportation Needs:   . Lack of Transportation (Medical): Not on file  . Lack of Transportation (Non-Medical): Not on file  Physical Activity:   . Days of Exercise per Week: Not on file  . Minutes of Exercise per Session: Not on file  Stress:   . Feeling of Stress : Not on file  Social Connections:   . Frequency of Communication with Friends and Family: Not on file  . Frequency of Social Gatherings with Friends and Family: Not on file  . Attends Religious Services: Not on file  . Active Member of Clubs or Organizations: Not on file  . Attends Archivist Meetings: Not on file  . Marital Status: Not on file    Family History  Problem Relation Age of Onset  . Other Father        mysthenia gravis  . Lung cancer Sister   . Osteoporosis Other   . Thyroid disease Other   . Breast cancer Mother 29       possible uterine cancer diagnosis at age 43  . Osteoporosis Mother        no hip fracture  . Breast cancer Sister 75  . Lung cancer Sister 31       former smoker  . Lung cancer Paternal Grandmother   . Colon cancer Neg Hx   . Esophageal cancer Neg Hx     Past Medical History:  Diagnosis Date  . Anxiety   . Asthma    evalutated by pulmonary severe by spirometry no smoker currently  . Breast cancer ([place])    hx of right lumpectomy, 1990 IIA radiation cmfp ajunct rx  . Chronic airway obstruction, not elsewhere classified   . Coronary artery  calcification seen on CAT scan 01/23/2018  . Diverticulosis    only has like 2  . Dysplasia    hx treated with cerv conization  . Fatty liver    US done 2010  . Fibromyalgia   . Hx of nonmelanoma skin cancer     followed dr [place]   . Hyperlipidemia   . Hypothyroidism   . Malignant neoplasm of breast (female), unspecified site 11/28/2013  . Osteoarthritis   . Skin cancer    squamous cell    Past Surgical History:  Procedure Laterality Date  . BREAST LUMPECTOMY     on right, 1990 IIA radiation cmfp ajuction rx  . KNEE SURGERY  2012  . NASAL SINUS SURGERY  2007   on right done by Dr. Wilburn Cornelia     Current Outpatient Medications on File Prior to Visit  Medication Sig Dispense Refill  . acetaminophen (TYLENOL) 500 MG tablet Take 1,000 mg by mouth daily as needed. pain     . atorvastatin (LIPITOR) 40 MG tablet Take 1 tablet (40 mg total) by mouth daily. (Patient taking differently: Take 20 mg by mouth daily. ) 90 tablet 3  . Cholecalciferol (VITAMIN D3) 50 MCG (2000 UT) TABS Take by mouth daily.    . diphenhydramine-acetaminophen (TYLENOL PM) 25-500 MG TABS Take 1 tablet by mouth at bedtime as needed. sleep    . fluticasone furoate-vilanterol (BREO ELLIPTA) 100-25 MCG/INH AEPB Inhale 1 puff into the lungs daily. 180 each 3  . Melatonin 3 MG TABS Take 1 tablet by mouth every other day.    . Na Sulfate-K Sulfate-Mg Sulf (SUPREP BOWEL PREP KIT) 17.5-3.13-1.6 GM/177ML SOLN Take 1 kit by mouth as directed. 324 mL 0  .  PARoxetine (PAXIL) 40 MG tablet Take 1 tablet (40 mg total) by mouth at bedtime. (Patient taking differently: Take 20 mg by mouth every other day. ) 90 tablet 0  . SYNTHROID 88 MCG tablet Take 1 tablet (88 mcg total) by mouth daily. 90 tablet 0  . Tiotropium Bromide Monohydrate (SPIRIVA RESPIMAT) 1.25 MCG/ACT AERS Inhale 2 puffs into the lungs daily. 12 g 3  . valACYclovir (VALTREX) 1000 MG tablet TAKE 2 TABLETS BY MOUTH TWICE DAILY AS DIRECTED (Patient taking differently:  TAKE 2 TABLETS BY MOUTH TWICE DAILY AS DIRECTED as needed for cold sores) 90 tablet prn  . vitamin B-12 (CYANOCOBALAMIN) 1000 MCG tablet Take 1,000 mcg by mouth daily.     No current facility-administered medications on file prior to visit.    Allergies  Allergen Reactions  . Hydrocodone Itching    Physical exam:  Today's Vitals   07/29/19 1459  BP: 115/62  Pulse: 78  Temp: (!) 97 F (36.1 C)  Weight: 162 lb (73.5 kg)  Height: '5\' 9"'$  (1.753 m)   Body mass index is 23.92 kg/m.   Wt Readings from Last 3 Encounters:  07/29/19 162 lb (73.5 kg)  07/22/19 162 lb (73.5 kg)  04/25/19 163 lb (73.9 kg)     Ht Readings from Last 3 Encounters:  07/29/19 '5\' 9"'$  (1.753 m)  07/22/19 '5\' 9"'$  (1.753 m)  04/25/19 '5\' 9"'$  (1.753 m)      General: The patient is awake, alert and appears not in acute distress.  The patient is well groomed. Head: Normocephalic, but visble bruising, traumatic facial injuries. . Mallampati 2 neck circumference:14  inches . Nasal airflow  patent.  Retrognathia is not seen. No retainers nor braces.  Dental status: dental crowns.  Cardiovascular:  Regular rate and cardiac rhythm by pulse. Respiratory: Lungs are clear to auscultation.  Asthmatic, but wheezing only when exercising.  Skin:  Without evidence of ankle edema, or rash. Trunk: The patient's posture is erect.   Neurologic exam : The patient is awake and alert, oriented to place and time.   Memory subjective described as intact.  Attention span & concentration ability appears normal.  Speech is fluent,  without  dysarthria, dysphonia or aphasia.  Mood and affect are appropriate.   Cranial nerves: no loss of smell or taste reported  Pupils are equal and briskly reactive to light. Extraocular movements in vertical and horizontal planes were intact and without nystagmus. No Diplopia. Hearing was right sided impaired to fine rubbing.  Facial motor strength is symmetric and tongue and uvula move midline.    Neck ROM : rotation, tilt and flexion extension were normal for age and shoulder shrug was symmetrical.    Motor exam:  Symmetric bulk, tone and ROM.   Normal tone without cog -wheeling, symmetric grip strength .no tremor , no falls when walking, no balance problems.    Coordination: Rapid alternating movements in the fingers/hands were of normal speed.   Gait and station: Patient could rise unassisted from a seated position, walked without assistive device.  Stance is of normal width/ base .  Toe and heel walk were deferred.  Deep tendon reflexes: in the  upper and lower extremities are symmetric and intact.  Babinski response was deferred.      After spending a total time of  30  minutes face to face and additional time for physical and neurologic examination, review of laboratory studies,  personal review of imaging studies, reports and results of other testing and  review of referral information / records as far as provided in visit, I have established the following assessments:  1) first onset of  Parasomnia 9 moth ago, second time 2 month ago- first time her dreams were almost nightmarish, she was chased. 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 She has had enuresis, again. She described more dreams in our RV. Sleep study confirmed REM BD.    Plan:  20 mg paroxetin every other day, currently alternating with melatonin. Will go to melatonin daily and d/c paxil.  She has also reduced alcohol intake- and noted some improvement in sleep.     For travels or when having guests over, I recommend benzodiazepines.  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   CC: I will share my notes with PCP, Dr. Regis Bill.   Electronically signed by: Larey Seat, MD 07/29/2019 3:00  PM  [facility] and [facility] Sleep Board certified by The [employer] of Sleep Medicine and Diplomate of the [employer] of Sleep Medicine. Board certified In Neurology through the [place], Fellow of the [employer] of Neurology. Medical Director of [employer].

## 2019-07-29 NOTE — Patient Instructions (Signed)
Melatonin oral solid dosage forms What is this medicine? MELATONIN (mel uh TOH nin) is a dietary supplement. It is mostly promoted to help maintain normal sleep patterns. The FDA has not approved this supplement for any medical use. This supplement may be used for other purposes; ask your health care provider or pharmacist if you have questions. This medicine may be used for other purposes; ask your health care provider or pharmacist if you have questions. COMMON BRAND NAME(S): Melatonex What should I tell my health care provider before I take this medicine? They need to know if you have any of these conditions:  cancer  depression or mental illness  diabetes  hormone problems  if you often drink alcohol  immune system problems  liver disease  lung or breathing disease, like asthma  organ transplant  seizure disorder  an unusual or allergic reaction to melatonin, other medicines, foods, dyes, or preservatives  pregnant or trying to get pregnant  breast-feeding How should I use this medicine? Take this supplement by mouth with a glass of water. Do not take with food. This supplement is usually taken 1 or 2 hours before bedtime. After taking this supplement, limit your activities to those needed to prepare for bed. Some products may be chewed or dissolved in the mouth before swallowing. Some tablets or capsules must be swallowed whole; do not cut, crush or chew. Follow the directions on the package labeling, or take as directed by your health care professional. Do not take this supplement more often than directed. Talk to your pediatrician regarding the use of this supplement in children. Special care may be needed. This supplement is not recommended for use in children without a prescription. Overdosage: If you think you have taken too much of this medicine contact a poison control center or emergency room at once. NOTE: This medicine is only for you. Do not share this medicine  with others. What if I miss a dose? If you miss taking your dose at the usual time, skip that dose. If it is almost time for your next dose, take only that dose. Do not take double or extra doses. What may interact with this medicine? Do not take this medicine with any of the following medications:  fluvoxamine  ramelteon  tasimelteon This medicine may also interact with the following medications:  alcohol  caffeine  carbamazepine  certain antibiotics like ciprofloxacin  certain medicines for depression, anxiety, or psychotic disturbances  cimetidine  female hormones, like estrogens and birth control pills, patches, rings, or injections  methoxsalen  nifedipine  other medications for sleep  other herbal or dietary supplements  phenobarbital  rifampin  smoking tobacco  tamoxifen  warfarin This list may not describe all possible interactions. Give your health care provider a list of all the medicines, herbs, non-prescription drugs, or dietary supplements you use. Also tell them if you smoke, drink alcohol, or use illegal drugs. Some items may interact with your medicine. What should I watch for while using this medicine? See your doctor if your symptoms do not get better or if they get worse. Do not take this supplement for more than 2 weeks unless your doctor tells you to. You may get drowsy or dizzy. Do not drive, use machinery, or do anything that needs mental alertness until you know how this medicine affects you. Do not stand or sit up quickly, especially if you are an older patient. This reduces the risk of dizzy or fainting spells. Alcohol may interfere with   the effect of this medicine. Avoid alcoholic drinks. After taking this medicine, you may get up out of bed and do an activity that you do not know you are doing. The next morning, you may have no memory of this. Activities include driving a car ("sleep-driving"), making and eating food, talking on the phone,  sexual activity, and sleep-walking. Serious injuries have occurred. Call your doctor right away if you find out you have done any of these activities. Do not take this medicine if you have used alcohol that evening. Do not take it if you have taken another medicine for sleep. The risk of doing these sleep-related activities is higher. Talk to your doctor before you use this supplement if you are currently being treated for an emotional, mental, or sleep problem. This medicine may interfere with your treatment. Herbal or dietary supplements are not regulated like medicines. Rigid quality control standards are not required for dietary supplements. The purity and strength of these products can vary. The safety and effect of this dietary supplement for a certain disease or illness is not well known. This product is not intended to diagnose, treat, cure or prevent any disease. The Food and Drug Administration suggests the following to help consumers protect themselves:  Always read product labels and follow directions.  Natural does not mean a product is safe for humans to take.  Look for products that include USP after the ingredient name. This means that the manufacturer followed the standards of the [place] Pharmacopoeia.  Supplements made or sold by a nationally known food or drug company are more likely to be made under tight controls. You can write to the company for more information about how the product was made. What side effects may I notice from receiving this medicine? Side effects that you should report to your doctor or health care professional as soon as possible:  allergic reactions like skin rash, itching or hives, swelling of the face, lips, or tongue  breathing problems  confusion  depressed mood, irritable, or other changes in moods or behaviors  feeling faint or lightheaded, falls  increased blood pressure  irregular or missed menstrual periods  signs and symptoms of liver  injury like dark yellow or brown urine; general ill feeling or flu-like symptoms; light-colored stools; loss of appetite; nausea; right upper belly pain; unusually weak or tired; yellowing of the eyes or skin  trouble staying awake or alert during the day  unusual activities while you are still asleep like driving, eating, making phone calls  unusual bleeding or bruising Side effects that usually do not require medical attention (report to your doctor or health care professional if they continue or are bothersome):  dizziness  drowsiness  headache  hot flashes  nausea  tiredness  unusual dreams or nightmares  upset stomach This list may not describe all possible side effects. Call your doctor for medical advice about side effects. You may report side effects to FDA at 1-800-FDA-1088. Where should I keep my medicine? Keep out of the reach of children. Store at room temperature or as directed on the package label. Protect from moisture. Throw away any unused supplement after the expiration date. NOTE: This sheet is a summary. It may not cover all possible information. If you have questions about this medicine, talk to your doctor, pharmacist, or health care provider.  2020 Elsevier/Gold Standard (2017-12-22 12:11:58)  

## 2019-07-29 NOTE — Telephone Encounter (Signed)
Patient called to advise she has been running into furniture at night and acting out at night and would like to know what she should do and if this is something that she should be concerned about. Patient states she has a lot of bruising and bumps no bleeding this time.   No sooner available appointments at the time of call and placed on wait list.

## 2019-08-03 ENCOUNTER — Ambulatory Visit: Payer: Medicare PPO | Attending: Internal Medicine

## 2019-08-03 DIAGNOSIS — Z23 Encounter for immunization: Secondary | ICD-10-CM | POA: Insufficient documentation

## 2019-08-03 NOTE — Progress Notes (Signed)
   Covid-19 Vaccination Clinic  Name:  Montessa Garske    MRN: [id number] DOB: Nov 20, 1945  08/03/2019  Ms. Waldrop was observed post Covid-19 immunization for 15 minutes without incidence. She was provided with Vaccine Information Sheet and instruction to access the V-Safe system.   Ms. Koeneman was instructed to call 911 with any severe reactions post vaccine: Marland Kitchen Difficulty breathing  . Swelling of your face and throat  . A fast heartbeat  . A bad rash all over your body  . Dizziness and weakness    Immunizations Administered    Name Date Dose VIS Date Route   Pfizer COVID-19 Vaccine 08/03/2019 12:37 PM 0.3 mL 06/21/2019 Intramuscular   Manufacturer: [place]   Lot: [id number]   [place]: [id number]

## 2019-08-12 ENCOUNTER — Other Ambulatory Visit: Payer: Self-pay

## 2019-08-12 ENCOUNTER — Ambulatory Visit (INDEPENDENT_AMBULATORY_CARE_PROVIDER_SITE_OTHER): Payer: Self-pay

## 2019-08-12 ENCOUNTER — Other Ambulatory Visit: Payer: Self-pay | Admitting: Gastroenterology

## 2019-08-12 DIAGNOSIS — Z1159 Encounter for screening for other viral diseases: Secondary | ICD-10-CM

## 2019-08-13 LAB — SARS CORONAVIRUS 2 (TAT 6-24 HRS): SARS Coronavirus 2: NEGATIVE

## 2019-08-14 ENCOUNTER — Ambulatory Visit (AMBULATORY_SURGERY_CENTER): Payer: Medicare PPO | Admitting: Gastroenterology

## 2019-08-14 ENCOUNTER — Other Ambulatory Visit: Payer: Self-pay

## 2019-08-14 ENCOUNTER — Encounter: Payer: Self-pay | Admitting: Gastroenterology

## 2019-08-14 VITALS — BP 135/69 | HR 67 | Temp 97.3°F | Resp 10 | Ht 69.0 in | Wt 162.0 lb

## 2019-08-14 DIAGNOSIS — Z1211 Encounter for screening for malignant neoplasm of colon: Secondary | ICD-10-CM

## 2019-08-14 DIAGNOSIS — D120 Benign neoplasm of cecum: Secondary | ICD-10-CM

## 2019-08-14 DIAGNOSIS — K635 Polyp of colon: Secondary | ICD-10-CM | POA: Diagnosis not present

## 2019-08-14 DIAGNOSIS — R933 Abnormal findings on diagnostic imaging of other parts of digestive tract: Secondary | ICD-10-CM

## 2019-08-14 DIAGNOSIS — D125 Benign neoplasm of sigmoid colon: Secondary | ICD-10-CM

## 2019-08-14 DIAGNOSIS — D122 Benign neoplasm of ascending colon: Secondary | ICD-10-CM

## 2019-08-14 MED ORDER — SODIUM CHLORIDE 0.9 % IV SOLN: 500.0000 mL | Freq: Once | INTRAVENOUS | Status: DC

## 2019-08-14 NOTE — Progress Notes (Signed)
PT taken to PACU. Monitors in place. VSS. Report given to RN. 

## 2019-08-14 NOTE — Progress Notes (Signed)
Called to room to assist during endoscopic procedure.  Patient ID and intended procedure confirmed with present staff. Received instructions for my participation in the procedure from the performing physician.  

## 2019-08-14 NOTE — Patient Instructions (Signed)
YOU HAD AN ENDOSCOPIC PROCEDURE TODAY AT [facility]:   Refer to the procedure report that was given to you for any specific questions about what was found during the examination.  If the procedure report does not answer your questions, please call your gastroenterologist to clarify.  If you requested that your care partner not be given the details of your procedure findings, then the procedure report has been included in a sealed envelope for you to review at your convenience later.  YOU SHOULD EXPECT: Some feelings of bloating in the abdomen. Passage of more gas than usual.  Walking can help get rid of the air that was put into your GI tract during the procedure and reduce the bloating. If you had a lower endoscopy (such as a colonoscopy or flexible sigmoidoscopy) you may notice spotting of blood in your stool or on the toilet paper. If you underwent a bowel prep for your procedure, you may not have a normal bowel movement for a few days.  Please Note:  You might notice some irritation and congestion in your nose or some drainage.  This is from the oxygen used during your procedure.  There is no need for concern and it should clear up in a day or so.  SYMPTOMS TO REPORT IMMEDIATELY:   Following lower endoscopy (colonoscopy or flexible sigmoidoscopy):  Excessive amounts of blood in the stool  Significant tenderness or worsening of abdominal pains  Swelling of the abdomen that is new, acute  Fever of 100F or higher   For urgent or emergent issues, a gastroenterologist can be reached at any hour by calling [phone].   DIET:  We do recommend a small meal at first, but then you may proceed to your regular diet.  Drink plenty of fluids but you should avoid alcoholic beverages for 24 hours. Follow a High Fiber Diet (see handout given to you by your recovery nurse).  MEDICATIONS: Continue present medications.  Please see handouts given to you by your recovery  nurse.  ACTIVITY:  You should plan to take it easy for the rest of today and you should NOT DRIVE or use heavy machinery until tomorrow (because of the sedation medicines used during the test).    FOLLOW UP: Our staff will call the number listed on your records 48-72 hours following your procedure to check on you and address any questions or concerns that you may have regarding the information given to you following your procedure. If we do not reach you, we will leave a message.  We will attempt to reach you two times.  During this call, we will ask if you have developed any symptoms of COVID 19. If you develop any symptoms (ie: fever, flu-like symptoms, shortness of breath, cough etc.) before then, please call [phone].  If you test positive for Covid 19 in the 2 weeks post procedure, please call and report this information to [place].    If any biopsies were taken you will be contacted by phone or by letter within the next 1-3 weeks.  Please call us at [phone] if you have not heard about the biopsies in 3 weeks.   Thank you for allowing [place] to provide for your healthcare needs today.   SIGNATURES/CONFIDENTIALITY: You and/or your care partner have signed paperwork which will be entered into your electronic medical record.  These signatures attest to the fact that that the information above on your After Visit Summary has been reviewed and is understood.  Full responsibility of the confidentiality of this discharge information lies with you and/or your care-partner.

## 2019-08-14 NOTE — Op Note (Signed)
[place] Patient Name: Jasmine Neal Procedure Date: 08/14/2019 1:35 PM MRN: [id number] Endoscopist: Thornton Park MD, MD Age: 74 Referring MD:  Date of Birth: 1945-08-18 Gender: Female Account #: [id number] Procedure:                Colonoscopy Indications:              High risk colon cancer surveillance: Personal                            history of colonic polyps                           History of colon polyp 2014                           - colonoscopy with Dr. Allyn Kenner                           - pathology results not known                           - no polyps on colonoscopy 2000                           [facility] suggested follow-up colonoscopy in                            2019-2024 Medicines:                Monitored Anesthesia Care Procedure:                Pre-Anesthesia Assessment:                           - Prior to the procedure, a History and Physical                            was performed, and patient medications and                            allergies were reviewed. The patient's tolerance of                            previous anesthesia was also reviewed. The risks                            and benefits of the procedure and the sedation                            options and risks were discussed with the patient.                            All questions were answered, and informed consent                            was obtained. Prior Anticoagulants: The patient has  taken no previous anticoagulant or antiplatelet                            agents. ASA Grade Assessment: III - A patient with                            severe systemic disease. After reviewing the risks                            and benefits, the patient was deemed in                            satisfactory condition to undergo the procedure.                           After obtaining informed consent, the colonoscope                            was  passed under direct vision. Throughout the                            procedure, the patient's blood pressure, pulse, and                            oxygen saturations were monitored continuously. The                            Colonoscope was introduced through the anus and                            advanced to the 3 cm into the ileum. A second                            forward view of the right colon was performed. The                            colonoscopy was performed without difficulty. The                            patient tolerated the procedure well. The quality                            of the bowel preparation was good. The terminal                            ileum, ileocecal valve, appendiceal orifice, and                            rectum were photographed. Scope In: 1:39:17 PM Scope Out: 1:55:06 PM Scope Withdrawal Time: 0 hours 12 minutes 9 seconds  Total Procedure Duration: 0 hours 15 minutes 49 seconds  Findings:                 The perianal and digital rectal examinations  were                            normal.                           Non-bleeding internal hemorrhoids were found.                           Multiple small and large-mouthed diverticula were                            found in the entire colon. Diverticulosis was most                            dense in the sigmoid and descending colon.                           A diffuse area of mildly melanotic mucosa was found                            in the entire colon.                           Three flat polyps were found in the sigmoid colon                            and cecum. The polyps were less than 1 mm in size.                            These polyps were removed with a cold biopsy                            forceps. Resection and retrieval were complete.                            Estimated blood loss was minimal.                           A 3 mm polyp was found in the ascending colon. The                             polyp was flat. The polyp was removed with a cold                            snare. Resection and retrieval were complete.                            Estimated blood loss was minimal.                           The exam was otherwise without abnormality on  direct and retroflexion views. Complications:            No immediate complications. Estimated blood loss:                            Minimal. Estimated Blood Loss:     Estimated blood loss was minimal. Impression:               - Non-bleeding internal hemorrhoids.                           - Diverticulosis in the entire examined colon.                           - Melanotic mucosa in the entire examined colon.                           - Three less than 1 mm polyps in the sigmoid colon                            and in the cecum, removed with a cold biopsy                            forceps. Resected and retrieved.                           - One 3 mm polyp in the ascending colon, removed                            with a cold snare. Resected and retrieved.                           - The examination was otherwise normal on direct                            and retroflexion views. Recommendation:           - Discharge patient to home.                           - High fiber diet.                           - Continue present medications.                           - Await pathology results.                           - Repeat colonoscopy date to be determined after                            pending pathology results are reviewed for                            surveillance. Thornton Park MD, MD 08/14/2019 2:05:04 PM This report has been signed electronically.

## 2019-08-14 NOTE — Progress Notes (Signed)
[facility]  

## 2019-08-16 ENCOUNTER — Telehealth: Payer: Self-pay

## 2019-08-16 NOTE — Telephone Encounter (Signed)
  Follow up Call-  Call back number 08/14/2019  Post procedure Call Back phone  # [phone]  Permission to leave phone message Yes  Some recent data might be hidden     Patient questions:  Do you have a fever, pain , or abdominal swelling? No. Pain Score  0 *  Have you tolerated food without any problems? Yes.    Have you been able to return to your normal activities? Yes.    Do you have any questions about your discharge instructions: Diet   No. Medications  No. Follow up visit  No.  Do you have questions or concerns about your Care? No.  Actions: * If pain score is 4 or above: No action needed, pain <4. 1. Have you developed a fever since your procedure? no  2.   Have you had an respiratory symptoms (SOB or cough) since your procedure? no  3.   Have you tested positive for COVID 19 since your procedure no  4.   Have you had any family members/close contacts diagnosed with the COVID 19 since your procedure?  no   If yes to any of these questions please route to Joylene John, RN and Alphonsa Gin, Therapist, sports.

## 2019-08-20 ENCOUNTER — Encounter: Payer: Self-pay | Admitting: Gastroenterology

## 2019-08-20 ENCOUNTER — Ambulatory Visit: Payer: Self-pay | Admitting: Neurology

## 2019-08-24 ENCOUNTER — Ambulatory Visit: Payer: Medicare PPO | Attending: Internal Medicine

## 2019-08-24 DIAGNOSIS — Z23 Encounter for immunization: Secondary | ICD-10-CM | POA: Insufficient documentation

## 2019-08-24 NOTE — Progress Notes (Signed)
   Covid-19 Vaccination Clinic  Name:  Jasmine Neal    MRN: [id number] DOB: 04/07/1946  08/24/2019  Jasmine Neal was observed post Covid-19 immunization for 15 minutes without incidence. She was provided with Vaccine Information Sheet and instruction to access the V-Safe system.   Jasmine Neal was instructed to call 911 with any severe reactions post vaccine: Marland Kitchen Difficulty breathing  . Swelling of your face and throat  . A fast heartbeat  . A bad rash all over your body  . Dizziness and weakness    Immunizations Administered    Name Date Dose VIS Date Route   Pfizer COVID-19 Vaccine 08/24/2019 11:37 AM 0.3 mL 06/21/2019 Intramuscular   Manufacturer: [place]   Lot: [id number]   [place]: [id number]

## 2019-09-23 ENCOUNTER — Ambulatory Visit: Payer: Medicare PPO | Admitting: Internal Medicine

## 2019-09-23 ENCOUNTER — Encounter: Payer: Self-pay | Admitting: Internal Medicine

## 2019-09-23 ENCOUNTER — Other Ambulatory Visit: Payer: Self-pay

## 2019-09-23 VITALS — BP 122/68 | HR 68 | Ht 69.0 in | Wt 161.8 lb

## 2019-09-23 DIAGNOSIS — R918 Other nonspecific abnormal finding of lung field: Secondary | ICD-10-CM

## 2019-09-23 DIAGNOSIS — R933 Abnormal findings on diagnostic imaging of other parts of digestive tract: Secondary | ICD-10-CM

## 2019-09-23 DIAGNOSIS — J4540 Moderate persistent asthma, uncomplicated: Secondary | ICD-10-CM | POA: Diagnosis not present

## 2019-09-23 MED ORDER — ALBUTEROL SULFATE HFA 108 (90 BASE) MCG/ACT IN AERS: 2.0000 | INHALATION_SPRAY | Freq: Four times a day (QID) | RESPIRATORY_TRACT | 5 refills | Status: DC | PRN

## 2019-09-23 NOTE — Progress Notes (Signed)
   PCP Neal, Jasmine K, MD   HPI  IOV 08/18/2017  Chief Complaint  Patient presents with  . pulmonary consult    SOB with activity wheezing with exercise and a cough in the morning.    74-year-old female whose husband died seen in 2015 for a lung nodule and he subsequently died from glioblastoma multiform A.  She is here as a new consult.  Her issues that she has had over 10 years of shortness of breath with exertion relieved by rest.  Insidious onset.  Slowly getting worse.  She also has cough with early morning wheezing.  The cough is dry but occasionally she has congestion.  Symptoms are indeed progressive.  Several years ago she was on Symbicort or Advair.  She does not remember if this improved her.  Then a few years ago she got changed to ANORO along with Asmanex.  6 months ago the Asmanex was stopped.  Overall these changes have not helped her.  She has learned to live with this. Vacuuming can  cause problems symptoms .  There is no associated chest pain.  She does not have hypertension or diabetes.  She is worried about the risk of lung cancer.  There is because a paternal grandfather and sister died from lung cancer.  Lab review shows that she has eosinophilia that is mild at 300 cells per cubic millimeter.  She has had previous allergy testing that was positive done by Dr. Kozlow.  Last CT scan of the chest was in 2006.  In May 2018 she had spirometry at the outside.  I personally visualized the trace.  FEV1 0.9 L / 38% FVC 46% with an obstructed ratio.  Exam nitric oxide today is elevated at 58 ppb. IN terms of smoking history, she smoke 1 ppd x 10 years but quit when she was 27.,> reports allrgy test 10 years ago as positive only for dust mites   OV 09/22/2017  Chief Complaint  Patient presents with  . Follow-up    PFT done, no asthma flare at this time    Jasmine Neal returns for follow-up.  She tells me that after starting Brio her symptoms are significantly improved.  In  fact asthma control questionnaire shows a symptom score of 0.5.  She is now waking up at night because of asthma symptoms.  When she wakes up she has no symptoms.  She feels very slightly limited in her activities only when walking uphill.  And she is actually has a very little shortness of breath but again only while walking uphill or climbing stairs.  She has never wheezed and not use albuterol for rescue at all.  Overall is a huge improvement.   Walking desaturation test on 09/22/2017 185 feet x 3 laps on ROOM AIR:  did no desaturate. Rest pulse ox was 100%, final pulse ox was 97%. HR response 69/min at rest to 93/min at peak exertion. Patient Jasmine Neal  Did not Desaturate < 88% . Jasmine Neal yes  Desaturated </= 3% points. Jasmine Neal yes did get tachyardic.  But she did drop pulse ox by 3 points.  In talking to her she tells me that she is got this fixed exertional dyspnea that is relieved by rest.  This may be slowly progressive.  There is no cough or wheezing.  She had pulmonary function test today and this shows significant severe obstruction and on bronchodilator responsiveness flow volume loop abnormalities.  The DLCO   was also reduced at 60%.  She is very surprised that her lung function abnormalities also severe because she does not feel the symptoms as much and she under perceives the symptoms relative to the lung function abnormality.  She is very keen on getting a CT scan of the chest because of family history of lung cancer.   OV 02/16/2018  Chief Complaint  Patient presents with  . Follow-up    5 month follow up to discuss test results.    Jasmine Neal eturns for follow-up of 2 issues  Moderate persistent asthma with fixed exertional dyspnea: She is tells me that with Brio her symptoms of improvement. Given the fixed exertional dyspnea is better. Overall asthma control questionnaire shows a score of 0.4 showing excellent control. She is not waking up in the middle of the  night with symptoms when she wakes up she has no symptoms. She is very slightly limited in her activities because of asthma and she'Neal experienced very little shortness of breath. She does not have any wheeze or does not use albuterol for rescue  Follow-up multiple lung nodules: the largest in 9mmm RUL nodule and this stable march 2019  -> July 2019    IMPRESSION: CT chest Near complete re solution of ill-defined nodular opacity in medial left lower lobe, consistent with resolving inflammatory or infectious etiology.  Multiple other sub-cm bilateral pulmonary nodules remain stable, largest in right lung apex measuring 9 mm. Recommend continued follow-up by chest CT without contrast in 12 months. This recommendation follows the consensus statement: Guidelines for Management of Small Pulmonary Nodules Detected on CT Images: From the Fleischner Society 2017; Radiology 2017; 284:228-243.   Electronically Signed   By: John  Stahl M.D.   On: 01/23/2018 15:19   OV 03/26/2019  Subjective:  Patient ID: Jasmine Neal, female , DOB: 12/03/1945 , age 73 y.o. , MRN: [id number] , ADDRESS: [address] [place] [address]   03/26/2019 -   Chief Complaint  Patient presents with  . Moderate persistent asthma without complication    Discuss results of PFT      ICD-10-CM   1. Moderate persistent asthma without complication  J45.40 Tiotropium Bromide Monohydrate (SPIRIVA RESPIMAT) 1.25 MCG/ACT AERS  2. Multiple lung nodules on CT  R91.8 Tiotropium Bromide Monohydrate (SPIRIVA RESPIMAT) 1.25 MCG/ACT AERS     HPI Jasmine Neal 73 y.o. -presents for the above problems.  Visit is nearly after 1 year.  In the interim she is stable.  She is up-to-date with her vaccines.  July 2019 because of coronary artery calcifications and ongoing fixed exertional dyspnea she had a cardiac stress test I reviewed the result and this was normal.  She says the Brio is helping her but despite this she  continues to have fixed exertional dyspnea.  Her asthma control questionnaire is stable.  She had repeat lung function today it is stable and unchanged which is good but she still has severe fixed obstructive lung defect.  She has a family history of myasthenia gravis and her dad but she denies any end of the day fatigue or eyelid drooping symptoms.  Moreover the pulmonary function test is not restrictive.  There is no cough we discussed and she is interested in adding inhaler therapy.  She is not interested in second opinion for a fixed obstructive lung disease defect.   For multiple lung nodules: She has 1-1/2-year stability at this point.      CT Chest Lungs/Pleura: Multiple bilateral   pulmonary nodules, as before. Index nodule in the apical segment right upper lobe measures 7 mm (5 x 8 mm), stable. Post radiation scarring in the anterior subpleural right lung. No pleural fluid. Airway is unremarkable.  Upper Abdomen: Visualized portions of the liver, gallbladder, adrenal glands, kidneys, spleen, pancreas, stomach and bowel are unremarkable. No upper abdominal adenopathy.  Musculoskeletal: Degenerative changes in the spine. No worrisome lytic or sclerotic lesions. Question mild radiation changes in the anterolateral right ribs, as before. No associated fracture.  IMPRESSION: Bilateral pulmonary nodules are stable and likely benign.   Electronically Signed   By: Lorin Picket M.D.   On: 01/30/2019 13:17    OV 09/23/2019  Subjective:  Patient ID: Jasmine Neal, female , DOB: 01-06-1946 , age 62 y.o. , MRN: [id number] , ADDRESS: [place] [address]   09/23/2019 -   Chief Complaint  Patient presents with  . Follow-up    Pt states she has been doing okay since last visit and denies any recent flare ups with her asthma.     HPI Jasmine Neal 74 y.o. -   Follow-up moderate persistent asthma: She has fixed obstructive disease that does  not respond to inhaler therapy although symptoms improved.  Therefore back in September 2020 I added Spiriva to the mix.  She says she only has still has mild dyspnea with fixed exertional relieved by rest.  It presents for walking an incline or stairs.  Otherwise she feels fine.  She does not think Spiriva has helped.  She was supposed to have pulmonary function test but because of COVID-19 pandemic this has been disrupted.  We do not have the data.  She said that she met anesthetist who said that he was surprised that she did not have a rescue inhaler.  She has never needed 1.  However I did agree with the anesthesiologist recommendation for her to carry an albuterol inhaler.  Therefore I settle make the prescription.  At this point in time she has had a Covid vaccine.  Pulmonary nodules: This is no stable for 2 years.  Results documented below.  I visualized and interpreted the CT scan myself.  There is report of emphysema on the CT scan but I could not appreciate it.  New issue: Fluid level in the esophagus: There is a new finding on the CT scan I personally visualized it.  She does have Dr. Tarri Glenn is a gastroenterologist.  Last seen towards the end of 2020 but before the CT scan.  There is no mention of any esophageal issues on Dr. Tarri Glenn medical record.  However this finding has postdated that visit.  I have asked the patient to take this up with Dr. Tarri Glenn.    Asthma Control Panel eos 300 always - last 2018, 100 in march 2019 and 400 in aug 2017. IgE 8 - march 2019 08/18/2017  09/22/2017  02/16/2018  03/26/2019  09/23/2019   Current Med Regimen anoro breo breo breo breo and spiriva  ACQ 5 point- 1 week. wtd avg score. <1.0 is good control 0.75-1.25 is grey zone. >1.25 poor control. Delta 0.5 is clinically meaningful 12/5 = 2.4 0.5 0.4 0.4 0.4  FeNO ppB 58  25    FeV1  0.96L/38% in May 2018 outside 1.11/425% -> 0.79/30% ratop 65 -> 39, DLCO 18.33/61%  1.14L/43% and ratio 52   Planned  intervention  for visit Change anoro to breo low dose breo + get HRCT + blood allergy panel  Results for Gassman, Jasmine Neal "Serina SLOAN Kangas" (MRN [id number]) as of 03/26/2019 10:13  Ref. Range 09/22/2017 09:55 03/26/2019 08:48  FEV1-Pre Latest Units: L 1.11 1.14  FEV1-%Pred-Pre Latest Units: % 42 43  Pre FEV1/FVC ratio Latest Units: % 49 52     IMPRESSION: CT CHEST 1. Ongoing stability of bilateral pulmonary nodules, most consistent favoring a benign etiology. 2. Right axillary node dissection, without findings of thoracic metastatic disease. 3. Aortic atherosclerosis (ICD10-I70.0), coronary artery atherosclerosis and emphysema (ICD10-J43.9). 4. Esophageal air fluid level suggests dysmotility or gastroesophageal reflux.   Electronically Signed   By: Kyle  Talbot M.D.   On: 06/25/2019 13:32 ROS - per HPI     has a past medical history of Anxiety, Asthma, Breast cancer (HCC), Cataract, Chronic airway obstruction, not elsewhere classified, Coronary artery calcification seen on CAT scan (01/23/2018), Diverticulosis, Dysplasia, Fatty liver, Fibromyalgia, nonmelanoma skin cancer, Hyperlipidemia, Hypothyroidism, Malignant neoplasm of breast (female), unspecified site (11/28/2013), Osteoarthritis, Osteopenia, REM sleep behavior disorder, and Skin cancer.   reports that she quit smoking about 45 years ago. Her smoking use included cigarettes. She has a 8.00 pack-year smoking history. She has never used smokeless tobacco.  Past Surgical History:  Procedure Laterality Date  . BREAST LUMPECTOMY     on right, 1990 IIA radiation cmfp ajuction rx  . COLONOSCOPY    . KNEE SURGERY  2012  . NASAL SINUS SURGERY  2007   on right done by Dr. Shoemaker    Allergies  Allergen Reactions  . Hydrocodone Itching    Immunization History  Administered Date(Neal) Administered  . Fluad Quad(high Dose 65+) 03/12/2019  . Hepatitis A 08/30/2017  . Influenza Whole 04/10/2002, 06/01/2009,  05/05/2010, 05/22/2012, 02/20/2017  . Influenza, High Dose Seasonal PF 04/22/2016  . Influenza,inj,Quad PF,6+ Mos 06/04/2013  . Influenza-Unspecified 05/23/2011  . PFIZER SARS-COV-2 Vaccination 08/03/2019, 08/24/2019  . Pneumococcal Conjugate-13 10/09/2013  . Pneumococcal Polysaccharide-23 05/05/2010  . Td 08/11/2006, 02/27/2017  . Typhoid Inactivated 09/07/2017  . Zoster 09/11/2012  . Zoster Recombinat (Shingrix) 06/06/2018, 03/14/2019    Family History  Problem Relation Age of Onset  . Other Father        mysthenia gravis  . Lung cancer Sister   . Osteoporosis Other   . Thyroid disease Other   . Breast cancer Mother 51       possible uterine cancer diagnosis at age 90  . Osteoporosis Mother        no hip fracture  . Breast cancer Sister 50  . Lung cancer Sister 68       former smoker  . Lung cancer Paternal Grandmother   . Colon cancer Neg Hx   . Esophageal cancer Neg Hx   . Stomach cancer Neg Hx   . Rectal cancer Neg Hx      Current Outpatient Medications:  .  acetaminophen (TYLENOL) 500 MG tablet, Take 1,000 mg by mouth daily as needed. pain , Disp: , Rfl:  .  atorvastatin (LIPITOR) 40 MG tablet, Take 1 tablet (40 mg total) by mouth daily. (Patient taking differently: Take 20 mg by mouth daily. ), Disp: 90 tablet, Rfl: 3 .  Cholecalciferol (VITAMIN D3) 50 MCG (2000 UT) TABS, Take by mouth daily., Disp: , Rfl:  .  diphenhydramine-acetaminophen (TYLENOL PM) 25-500 MG TABS, Take 1 tablet by mouth at bedtime as needed. sleep, Disp: , Rfl:  .  fluticasone furoate-vilanterol (BREO ELLIPTA) 100-25 MCG/INH AEPB, Inhale 1 puff into the lungs daily., Disp: 180 each, Rfl: 3 .    Melatonin 3 MG TABS, Take 1 tablet by mouth at bedtime., Disp: , Rfl:  .  SYNTHROID 88 MCG tablet, Take 1 tablet (88 mcg total) by mouth daily., Disp: 90 tablet, Rfl: 0 .  Tiotropium Bromide Monohydrate (SPIRIVA RESPIMAT) 1.25 MCG/ACT AERS, Inhale 2 puffs into the lungs daily., Disp: 12 g, Rfl: 3 .   valACYclovir (VALTREX) 1000 MG tablet, TAKE 2 TABLETS BY MOUTH TWICE DAILY AS DIRECTED (Patient taking differently: Take 1,000 mg by mouth 3 times/day as needed-between meals & bedtime. TAKE 2 TABLETS BY MOUTH TWICE DAILY AS DIRECTED as needed for cold sores), Disp: 90 tablet, Rfl: prn .  vitamin B-12 (CYANOCOBALAMIN) 1000 MCG tablet, Take 1,000 mcg by mouth daily., Disp: , Rfl:  .  albuterol (VENTOLIN HFA) 108 (90 Base) MCG/ACT inhaler, Inhale 2 puffs into the lungs every 6 (six) hours as needed for wheezing or shortness of breath., Disp: 8 g, Rfl: 5  Current Facility-Administered Medications:  .  0.9 %  sodium chloride infusion, 500 mL, Intravenous, Once, [place], MD      Objective:   Vitals:   09/23/19 0906  BP: 122/68  Pulse: 68  SpO2: 100%  Weight: 161 lb 12.8 oz (73.4 kg)  Height: 5' 9" (1.753 m)    Estimated body mass index is 23.89 kg/m as calculated from the following:   Height as of this encounter: 5' 9" (1.753 m).   Weight as of this encounter: 161 lb 12.8 oz (73.4 kg).  _0 @  Filed Weights   09/23/19 0906  Weight: 161 lb 12.8 oz (73.4 kg)     Physical Exam Pleasant and alert and oriented x3.  No neck nodes no elevated JVP.  Normal oral cavity without any thrush.  Normal lung sounds.  Normal heart sounds abdomen soft nontender no organomegaly.  No sinus no clubbing no edema.       Assessment:       ICD-10-CM   1. Moderate persistent asthma without complication  Q59.56   2. Multiple lung nodules on CT  R91.8   3. Abnormal CT scan, esophagus  R93.3        Plan:     Patient Instructions  Moderate persistent asthma without complication  - stable and well controlled  - start albuterol as needed - you do need rescue inhaler - continue breo as before  -continue spiriva daily   - this was started sept 2020 and not sure is helping but for now continue - do spirometry and BD response and dlco in 3 months  -If this is stable and no change  we can try to reduce the inhaler.  We could discuss second opinion for fixed obstructive lung disease.  Multiple lung nodules on CT  - largest in 78mm Right upper lobe -stable march -> july 2019 ->July 2020 -> dec 2020  - likely benging - next CT maybe in 1-2 years - decide at followup  Fluid level in esophagus - seeon CT dec 2020 - new finding  - please d/w Dr BTarri Glenn Followup 3 months or sooner if needed  But after breathing test     SIGNATURE    Dr. MBrand Males M.D., F.C.C.P,  Pulmonary and Critical Care Medicine Staff Physician, CGrand View-on-HudsonDirector - Interstitial Lung Disease  Program  Pulmonary [place] [address] Pager: [phone] If no answer or between  15:00h - 7:00h: call [phone] Telephone:  [phone]  9:39 AM 09/23/2019

## 2019-09-23 NOTE — Patient Instructions (Addendum)
Moderate persistent asthma without complication  - stable and well controlled  - start albuterol as needed - you do need rescue inhaler - continue breo as before  -continue spiriva daily   - this was started sept 2020 and not sure is helping but for now continue - do spirometry and BD response and dlco in 3 months  -If this is stable and no change we can try to reduce the inhaler.  We could discuss second opinion for fixed obstructive lung disease.  Multiple lung nodules on CT  - largest in 76mmm Right upper lobe -stable march -> july 2019 ->July 2020 -> dec 2020  - likely benging - next CT maybe in 1-2 years - decide at followup  Fluid level in esophagus - seeon CT dec 2020 - new finding  - please d/w Dr Tarri Glenn  Followup 3 months or sooner if needed  But after breathing test

## 2019-10-16 ENCOUNTER — Telehealth: Payer: Self-pay | Admitting: Neurology

## 2019-10-16 NOTE — Telephone Encounter (Signed)
Called the pt back. There was no answer. LVM asking the patient to call back. Also advised if sending a mychart message was easier that would be fine too.   If pt calls back and I am unable to answer the phone please advise that Dr Dohmeier recommended the patient follow up in 2 mths at her last visit. I would encourage if she is having some concerns she may benefit through having a office visit to discuss further. She can still feel free to advise of her concern though.

## 2019-10-16 NOTE — Telephone Encounter (Signed)
Patient called requesting a cb from rn to discuss sleep study results and some sleep issues she has been having

## 2019-10-17 NOTE — Telephone Encounter (Signed)
FYI pt called back, message from [place], [place] was read to her.  Pt accepted an appointment for 04-14, no call back requested

## 2019-10-21 ENCOUNTER — Telehealth: Payer: Self-pay | Admitting: Neurology

## 2019-10-21 NOTE — Telephone Encounter (Signed)
Patient called wanting to verify if her apt should be switched to virtual states believes she has allergies and has red eyes/matted eyes/runny nose/coughing and believe it is due to working out in the yard but wanted to check in  Before hand

## 2019-10-21 NOTE — Telephone Encounter (Addendum)
I spoke to the patient who is having the following  symptoms: red,matted eyes; runny nose; coughing; hoarseness. Denies fever and feels like it is allergy related. She has been working in her yard. Reports having received both COVID-19 vaccinations. However, she has converted her 10/23/19 follow up to a mychart visit to be cautious.

## 2019-10-23 ENCOUNTER — Telehealth (INDEPENDENT_AMBULATORY_CARE_PROVIDER_SITE_OTHER): Payer: Medicare PPO | Admitting: Neurology

## 2019-10-23 DIAGNOSIS — G4752 REM sleep behavior disorder: Secondary | ICD-10-CM | POA: Diagnosis not present

## 2019-10-23 DIAGNOSIS — R0683 Snoring: Secondary | ICD-10-CM | POA: Diagnosis not present

## 2019-10-23 DIAGNOSIS — G4750 Parasomnia, unspecified: Secondary | ICD-10-CM | POA: Diagnosis not present

## 2019-10-23 DIAGNOSIS — R4189 Other symptoms and signs involving cognitive functions and awareness: Secondary | ICD-10-CM

## 2019-11-03 ENCOUNTER — Encounter: Payer: Self-pay | Admitting: Neurology

## 2019-11-03 DIAGNOSIS — G4752 REM sleep behavior disorder: Secondary | ICD-10-CM | POA: Insufficient documentation

## 2019-11-03 NOTE — Progress Notes (Signed)
Virtual Visit via Video Note  I connected with Jasmine Neal on 10/23/19 at  8:30 AM EDT by a video enabled telemedicine application and verified that I am speaking with the correct person using two identifiers.  Location: Patient: at home Provider: at [facility]    I discussed the limitations of evaluation and management by telemedicine and the availability of in person appointments. The patient expressed understanding and agreed to proceed.  History of Present Illness: organic parasomnia, followed since 2020. Previously seen for Vertigo by [facility].  Jasmine Neal is a 74 year old right-handed Caucasian widowed female patient referred by Dr. Shanon Ace, MD from the [facility].  The patient was last seen on 04-22-2019 for a sleep consultation.  The patient had described that after her husband's death she has no longer any report about how she sleeps as she is now alone.  However her husband used to tell her that she was moving her sleep and laughing in her sleep and talking.  She believes that she is acting out dreams at one time of this she fell out of bed and another time she "" dove out of bed injuring herself post times.  The suspect suspicion is that of REM sleep behavior disorder. I have ordered a REM sleep montage EEG expanded EEG polysomnography which was performed on 07 June 2019.   Observations/Objective: We discussed the failure of Treatment of early REM sleep onset and also the persistence of muscle tone during REM sleep which is a indicator for the presence of REM sleep behavior.  I explained that during REM sleep her muscles are supposed to be very relaxed preventing exactly the enactment of dreams that she had described.  We also discussed 3 of her medications can worsen muscle tone or may be causing periodic limb movements in sleep.  Her REM sleep latency was 150 minutes sleep efficiency was good at about 89%, she stayed 82 minutes total in stage REM.  She had no 0 apneas or hypopneas.   There were only spontaneous arousals noted.  She has tried to wean off Paxil which was very hard for her but then had to ask if she could restart it which I think is fine since we have completed our sleep study we have no reason to perform an MSLT, her main concern is not narcolepsy or hypersomnia.  She also had questions about new onset allergies.  It is always possible to contract new allergies, as well as losing some hypersensitivity to environmental factors over the years as well.  She was concerned about her allergies in context with the Covid vaccine.  She reports that she has some swallowing difficulties along with hoarseness that she attributes to allergies-  she also explained that the rumination of thoughts is still present and may be stronger when she weaned off Paxil or try to wean off completely.  She also reports strange smells and all factory aura which could mean that she has olfactory hallucinations these are often part of a Lewy body dementia or Parkinson's disease.  Again, REM sleep behaviors themselves indicate a higher risk of converting into 1 of these 2 disorders.   Assessment and Plan: Our next visit in the office will be dedicated to checking muscle tone, possible cogwheeling, evaluating a degree of facial mimic, smooth eye movements, posterior and fall risk.  Treatment for REM sleep behavior is usually first-line with melatonin.  Melatonin at 5 mg or less is considered safe it is a nonhabit-forming supplement.  If this  alone is not enough I usually add Klonopin. I am looking forward to our revisit with this pleasant patient,   Follow Up Instructions: Rv ordered.  Trial of Melatonin, Klonopin discussed as plan B.     I discussed the assessment and treatment plan with the patient. The patient was provided an opportunity to ask questions and all were answered. The patient agreed with the plan and demonstrated an understanding of the instructions.   The patient was advised to call  back or seek an in-person evaluation if the symptoms worsen or if the condition fails to improve as anticipated.  I provided 21 minutes of non-face-to-face time including preparation time - sleep study, medication review, allergies were reviewed and new symptoms discussed during this encounter.   Larey Seat, MD

## 2019-11-26 ENCOUNTER — Other Ambulatory Visit (HOSPITAL_COMMUNITY): Payer: Medicare PPO

## 2019-11-26 ENCOUNTER — Encounter: Payer: Self-pay | Admitting: Gastroenterology

## 2019-11-26 ENCOUNTER — Ambulatory Visit: Payer: Medicare PPO | Admitting: Gastroenterology

## 2019-11-26 ENCOUNTER — Other Ambulatory Visit: Payer: Self-pay

## 2019-11-26 ENCOUNTER — Other Ambulatory Visit (HOSPITAL_COMMUNITY)
Admission: RE | Admit: 2019-11-26 | Discharge: 2019-11-26 | Disposition: A | Discharge status: Home / Self Care | Payer: Medicare PPO | Source: Ambulatory Visit | Attending: Internal Medicine | Admitting: Internal Medicine

## 2019-11-26 VITALS — BP 132/80 | HR 72 | Ht 69.0 in | Wt 161.2 lb

## 2019-11-26 DIAGNOSIS — Z01812 Encounter for preprocedural laboratory examination: Secondary | ICD-10-CM | POA: Diagnosis present

## 2019-11-26 DIAGNOSIS — K5730 Diverticulosis of large intestine without perforation or abscess without bleeding: Secondary | ICD-10-CM | POA: Diagnosis not present

## 2019-11-26 DIAGNOSIS — R748 Abnormal levels of other serum enzymes: Secondary | ICD-10-CM | POA: Diagnosis not present

## 2019-11-26 DIAGNOSIS — R933 Abnormal findings on diagnostic imaging of other parts of digestive tract: Secondary | ICD-10-CM

## 2019-11-26 DIAGNOSIS — Z20822 Contact with and (suspected) exposure to covid-19: Secondary | ICD-10-CM | POA: Diagnosis not present

## 2019-11-26 LAB — SARS CORONAVIRUS 2 (TAT 6-24 HRS): SARS Coronavirus 2: NEGATIVE

## 2019-11-26 NOTE — Patient Instructions (Addendum)
If you are age 75 or older, your body mass index should be between 23-30. Your Body mass index is 23.81 kg/m. If this is out of the aforementioned range listed, please consider follow up with your Primary Care Provider.  If you are age 19 or younger, your body mass index should be between 19-25. Your Body mass index is 23.81 kg/m. If this is out of the aformentioned range listed, please consider follow up with your Primary Care Provider.   Please call me at any time to schedule an upper endoscopy with endoscopy with esophagus biopsies.  You would want to consider treatment with a medication like pantoprazole if your biopsies showed reflux esophagitis.  Although your liver enzymes are reported normal by the lab, your goal ALT is 30. The testing with ultrasound and blood work shows no advanced liver disease, thankfully, but, there is probably some ongoing inflammation. Alcohol is the most likely culprit. Although thryoid disease can effect your liver enzymes, I think this is less likely. I previously evaluated you for autoimmune hepatitis and those tests were negative.  Your colonoscopy showed diverticulosis. Follow a high fiber diet or use fiber supplements on a regular basis. There is no need to avoid seeds, nuts, corn, and berries. A high fiber diet is most important.  Let's plan to follow-up in 1 year, or earlier if you need anything.   Due to recent changes in healthcare laws, you may see the results of your imaging and laboratory studies on MyChart before your provider has had a chance to review them.  We understand that in some cases there may be results that are confusing or concerning to you. Not all laboratory results come back in the same time frame and the provider may be waiting for multiple results in order to interpret others.  Please give [place] 48 hours in order for your provider to thoroughly review all the results before contacting the office for clarification of your results.

## 2019-11-26 NOTE — Progress Notes (Signed)
Referring Provider: Burnis Medin, MD Primary Care Physician:  Burnis Medin, MD   Chief complaint:  CT scan results   IMPRESSION:  Abnormal esophageal findings on CT scan    - CT of the chest 06/25/2019: esophageal air-fluid levels suggesting dysmotility or esophageal reflux    - No associated symptoms History of abnormal liver enzymes    - isolated ALT 37 02/2016, 102 03/2016    - all repeat testing has been normal until 03/2019    - ultrasound and MRI 2017 were essentially normal    - hepatitis C antibody negative 09/11/2012    - ANA negative 05/15/2016    - celiac disease serology 08/30/16: TTGA negative, IgA 206    - 5' nucleotidase 11 (normal 0-10)    - ongoing alcohol use    - FibroSURE showed steatosis without NASH    - Elastography showed low probability of liver disease HOMA-IR of 5.6 Buttock, groin, rectum and thigh pain of unclear etiology    - normal rectum exam without reproduction of symptoms Internal hemorrhoids History of colon polyp 2014    - colonoscopy with Dr. Allyn Kenner    - pathology results not known    - no polyps on colonoscopy 2000    Jasmine Neal suggested follow-up colonoscopy in 2019-2024    - no polyps but polypoid mucosa on colonoscopy 08/2019 Diverticulosis without history of diverticulitis  Abnormal CT scan: Rereviewed CT of the chest that suggests the possibility of dysmotility or reflux. She continues to deny any associated symptoms and is not eager to pursue endoscopic evaluation without them. However, with questioning she does have globus. Discussed risks/benefits of empiric PPI therapy versus definitive diagnosis prior to treatment. Recommended EGD with esophageal biopsies to screen for reflux and eosinophilic esophagitis given her history of asthma.    Abnormal liver enzymes: Recent labs show normalizing in her mildly elevated transaminases, although a truly normal ALT for her is 30. Likely due to alcohol given negative serologic evaluation for  other common causes of liver disease.  Elastography and FibroSURE results reviewed today, showing no significant advanced fibrosis. Will plan annual follow-up.   Colon polyps: She has a history of colon polyps. Recent colonoscopy shows polypoid mucosa without polyps.  No surveillance colonoscopy indicated given her advanced   Diverticulosis seen on colonoscopy: Reviewed diagnosis and findings from colonoscopy.     PLAN: - Minimize alcohol use, as this may be increasing the liver enzymes - Work to maintain a health weight - High fiber diet, consider daily addition of psyllium or methycellulose, drink at least 64 ounces of water - Proceed with EGD with esophageal biopsies (she may call to schedule at any time) - No further colonoscopy planned at this time given her age  - Follow-up at least annually  I spent 38 minutes, including in depth chart review, independent review of results, communicating results with the patient directly, face-to-face time with the patient, coordinating care, and ordering studies and medications as appropriate, and documentation.  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 She was last seen by me 07/22/19 and had a colonoscopy 08/14/19. The interval history is obtained through the patient and review of her electronic health record. Previously under the care of Dr. Allyn Kenner. History of anxiety, arthritis, asthma, breast cancer s/p lumpectomy 1990, diverticulosis, hypercholesterolemia, and hypothyroidism. She is followed by pulmonary for multiple lung nodules on CT.  History of elevated liver enzymes. Elevated several years ago and again last year. Intermittently normal.  Previously evaluated by Dr. Allyn Kenner, most recently in 2018. Told she had fatty liver. Also suggested celiac, but, testing for that was negative. Liver enzymes are now normal again.  Ultrasound and MRI in 2017 were normal. No  known family history of liver disease. No risk factors for chronic viral hepatitis. Prior screening for HCV was negative. No history of jaundice or scleral icterus.   Review of EPIC shows an isolated ALT 37 02/2016 and  ALT 102 03/2016. They have been normal since that time until September.  Recent labs 03/12/19: AST 42, ALT 46, alk phos 96, TB 0.5. Labs 05/14/2019 show a glucose of 87, iron 105, ferritin 77, IgG 881, IgM 66, ANA negative, IgA 263, AMA less than 20, hep B surface antigen negative hep B core antibody total nonreactive, insulin 26, F-actin less than 20. Labs 07/22/19: AST 20, ALT 21, alk phos 89, TB 0.4, TP 7.1, alb 4.4 FibroSURE F1-F2 fibrosis, S1-S2 minimal-moderate steatosis, N0 - not NASH  Drinking one glass of red wine with dinner historically. No symptoms that she associates with elevated liver enzymes.   Elastography 05/03/2019 showed a normal sonographic appearance of the liver.  The medium kPa was 3.4. This has a high probability of being normal without advanced liver disease or fibrosis noted.  CT of the chest 06/25/2019 showed stable pulmonary nodules, aortic atherosclerosis, and esophageal air-fluid levels suggesting dysmotility or esophageal reflux.  She has no symptoms attributed to the CT scan findings. No reflux, heartburn, dysphagia, odynophagia, dysphonia, sore throat, neck pain. Although on further questioning today may have some globus.  Colonoscopy 08/14/19 showed: - Non-bleeding internal hemorrhoids. - Diverticulosis in the entire examined colon. - Melanotic mucosa in the entire examined colon. - Three less than 1 mm polyps in the sigmoid colon and in the cecum, removed with a cold biopsy forceps. Resected and retrieved. Pathology showed normal colon.  - One 3 mm polyp in the ascending colon, removed with a cold snare. Resected and retrieved. Pathology showed normal colon.  - The examination was otherwise normal on direct and retroflexion views.  Prior endoscopy  history: Colonoscopy with Dr. Allyn Kenner 2000: diverticulosis. No hemorrhoids mentioned.  Colonoscopy with Dr. Allyn Kenner 12/28/00: Normal, repeat in 5 years given history of breast cancer Colonoscopy with Dr. Allyn Kenner 2014: small polyp, diverticulosis. No hemorrhoids mentioned.  Colonoscopy 08/14/19: internal hemorrhoids, pancolonic diverticulosis, melanosis coli, 4 small polyps - no polyps seen on pathology No prior upper endoscopy  Prior abdominal imaging: - Abdominal ultrasound 04/11/16: heterogeneous hepatic echotexture without discrete mass. No gallstones.  - MRI 05/10/16: no steatosis; slightly reduced in phase parenchymal signal intensity in the liver - ? Hemochromatosis although this is not supported by other sequences - Elastography 05/03/2019:  normal sonographic appearance of the liver.  The medium kPa was 3.4. This has a high probability of being normal without advanced liver disease or fibrosis noted.  - CT of the chest 06/25/2019: stable pulmonary nodules, aortic atherosclerosis, and esophageal air-fluid levels suggesting dysmotility or esophageal reflux.   Past Medical History:  Diagnosis Date  . Anxiety   . Asthma    evalutated by pulmonary severe by spirometry no smoker currently  . Breast cancer ([place])    hx of right lumpectomy, 1990 IIA radiation cmfp ajunct rx  . Cataract    removed from both eyes  . Chronic airway obstruction, not elsewhere classified   . Coronary artery calcification seen  on CAT scan 01/23/2018  . Diverticulosis    only has like 2  . Dysplasia    hx treated with cerv conization  . Fatty liver    US done 2010  . Fibromyalgia   . Hx of nonmelanoma skin cancer     followed dr [place]   . Hyperlipidemia   . Hypothyroidism   . Malignant neoplasm of breast (female), unspecified site 11/28/2013  . Osteoarthritis   . Osteopenia   . REM sleep behavior disorder   . Skin cancer    squamous cell    Past Surgical History:  Procedure Laterality Date  . BREAST  LUMPECTOMY     on right, 1990 IIA radiation cmfp ajuction rx  . COLONOSCOPY    . KNEE SURGERY  2012  . NASAL SINUS SURGERY  2007   on right done by Dr. Wilburn Cornelia    Current Outpatient Medications  Medication Sig Dispense Refill  . acetaminophen (TYLENOL) 500 MG tablet Take 1,000 mg by mouth daily as needed. pain     . albuterol (VENTOLIN HFA) 108 (90 Base) MCG/ACT inhaler Inhale 2 puffs into the lungs every 6 (six) hours as needed for wheezing or shortness of breath. 8 g 5  . atorvastatin (LIPITOR) 40 MG tablet Take 1 tablet (40 mg total) by mouth daily. (Patient taking differently: Take 20 mg by mouth daily. ) 90 tablet 3  . Cholecalciferol (VITAMIN D3) 50 MCG (2000 UT) TABS Take by mouth daily.    . diphenhydramine-acetaminophen (TYLENOL PM) 25-500 MG TABS Take 1 tablet by mouth at bedtime as needed. sleep    . fluticasone furoate-vilanterol (BREO ELLIPTA) 100-25 MCG/INH AEPB Inhale 1 puff into the lungs daily. 180 each 3  . Melatonin 3 MG TABS Take 1 tablet by mouth at bedtime.    Marland Kitchen SYNTHROID 88 MCG tablet Take 1 tablet (88 mcg total) by mouth daily. 90 tablet 0  . Tiotropium Bromide Monohydrate (SPIRIVA RESPIMAT) 1.25 MCG/ACT AERS Inhale 2 puffs into the lungs daily. 12 g 3  . valACYclovir (VALTREX) 1000 MG tablet TAKE 2 TABLETS BY MOUTH TWICE DAILY AS DIRECTED (Patient taking differently: Take 1,000 mg by mouth 3 times/day as needed-between meals & bedtime. TAKE 2 TABLETS BY MOUTH TWICE DAILY AS DIRECTED as needed for cold sores) 90 tablet prn  . vitamin B-12 (CYANOCOBALAMIN) 1000 MCG tablet Take 1,000 mcg by mouth daily.     Current Facility-Administered Medications  Medication Dose Route Frequency Provider Last Rate Last Admin  . 0.9 %  sodium chloride infusion  500 mL Intravenous Once [place], MD        Allergies as of 11/26/2019 - Review Complete 11/26/2019  Allergen Reaction Noted  . Hydrocodone Itching 11/08/2010    Family History  Problem Relation Age of Onset   . Other Father        mysthenia gravis  . Lung cancer Sister   . Osteoporosis Other   . Thyroid disease Other   . Breast cancer Mother 28       possible uterine cancer diagnosis at age 44  . Osteoporosis Mother        no hip fracture  . Breast cancer Sister 46  . Lung cancer Sister 86       former smoker  . Lung cancer Paternal Grandmother   . Colon cancer Neg Hx   . Esophageal cancer Neg Hx   . Stomach cancer Neg Hx   . Rectal cancer Neg Hx     Social  History   Socioeconomic History  . Marital status: Widowed    Spouse name: Not on file  . Number of children: 1  . Years of education: Not on file  . Highest education level: Not on file  Occupational History  . Occupation: Retired Pharmacist, hospital  Tobacco Use  . Smoking status: Former Smoker    Packs/day: 0.80    Years: 10.00    Pack years: 8.00    Types: Cigarettes    Quit date: 07/11/1974    Years since quitting: 45.4  . Smokeless tobacco: Never Used  Substance and Sexual Activity  . Alcohol use: Yes    Alcohol/week: 7.0 standard drinks    Types: 7 Standard drinks or equivalent per week    Comment: 1 glass of red wine nightly and socially  . Drug use: No  . Sexual activity: Yes    Partners: Male  Other Topics Concern  . Not on file  Social History Narrative   Retired Pharmacist, hospital    hh of 1   Now has puppy    Quit smoking 30 + years ago. 20 year pack year smoking hx   Regular exercise- some  treadmill    8 hours sleep         Caffeine use: 1-2 cups of coffee/[employer] instant   Husband dx glioblastoma fall 15   Passed away fall 08-20-2014   Social Determinants of Health   Financial Resource Strain:   . Difficulty of Paying Living Expenses:   Food Insecurity:   . Worried About Charity fundraiser in the Last Year:   . Arboriculturist in the Last Year:   Transportation Needs:   . Film/video editor (Medical):   Marland Kitchen Lack of Transportation (Non-Medical):   Physical Activity:   . Days of Exercise per Week:   . Minutes  of Exercise per Session:   Stress:   . Feeling of Stress :   Social Connections:   . Frequency of Communication with Friends and Family:   . Frequency of Social Gatherings with Friends and Family:   . Attends Religious Services:   . Active Member of Clubs or Organizations:   . Attends Archivist Meetings:   Marland Kitchen Marital Status:   Intimate Partner Violence:   . Fear of Current or Ex-Partner:   . Emotionally Abused:   Marland Kitchen Physically Abused:   . Sexually Abused:      Physical Exam: Gen: Awake, alert, and oriented, and well communicative. HEENT: EOMI, non-icteric sclera, NCAT, MMM  Neck: Normal movement of head and neck  Pulm: No labored breathing, speaking in full sentences without conversational dyspnea  Derm: No apparent lesions or bruising in visible field  MS: Moves all visible extremities without noticeable abnormality  Psych: Pleasant, cooperative, normal speech, thought processing seemingly intact     Tony Friscia L. Tarri Glenn, MD, MPH [facility] 11/26/2019, 8:28 AM

## 2019-11-29 ENCOUNTER — Ambulatory Visit (INDEPENDENT_AMBULATORY_CARE_PROVIDER_SITE_OTHER): Payer: Medicare PPO | Admitting: Internal Medicine

## 2019-11-29 ENCOUNTER — Other Ambulatory Visit: Payer: Self-pay

## 2019-11-29 DIAGNOSIS — J4540 Moderate persistent asthma, uncomplicated: Secondary | ICD-10-CM

## 2019-11-29 LAB — PULMONARY FUNCTION TEST
DL/VA % pred: 110 %
DL/VA: 4.36 ml/min/mmHg/L
DLCO cor % pred: 72 %
DLCO cor: 16.3 ml/min/mmHg
DLCO unc % pred: 72 %
DLCO unc: 16.3 ml/min/mmHg
FEF 25-75 Pre: 0.52 L/sec
FEF2575-%Pred-Pre: 26 %
FEV1-%Pred-Pre: 48 %
FEV1-Pre: 1.26 L
FEV1FVC-%Pred-Pre: 73 %
FEV6-%Pred-Pre: 67 %
FEV6-Pre: 2.21 L
FEV6FVC-%Pred-Pre: 100 %
FVC-%Pred-Pre: 66 %
FVC-Pre: 2.29 L
Pre FEV1/FVC ratio: 55 %
Pre FEV6/FVC Ratio: 96 %

## 2019-11-29 NOTE — Progress Notes (Signed)
PFT done today. 

## 2019-12-04 ENCOUNTER — Other Ambulatory Visit: Payer: Self-pay

## 2019-12-04 ENCOUNTER — Ambulatory Visit: Payer: Medicare PPO | Admitting: Internal Medicine

## 2019-12-04 ENCOUNTER — Encounter: Payer: Self-pay | Admitting: Internal Medicine

## 2019-12-04 VITALS — BP 126/74 | HR 82 | Temp 98.2°F | Ht 69.0 in | Wt 160.4 lb

## 2019-12-04 DIAGNOSIS — J4540 Moderate persistent asthma, uncomplicated: Secondary | ICD-10-CM

## 2019-12-04 NOTE — Patient Instructions (Addendum)
Moderate persistent asthma without complication  - stable and well controlled - breathing test on average is stable over time but still with fixed lung obstruction  Plan  -We discussed adding biologic therapy but at this point because of lack of exacerbation and the only quality gain would be climbing hills or doing gardening we going to hold this off -Continue  albuterol as needed - you do need rescue inhaler - continue breo as before  -Stop Spiriva as part of an effort to reduce your overall medication load  - do spirometry and BD response and dlco in 3 months  - -If lung function and symptoms are getting worse at follow-up then will have to add this back   Multiple lung nodules on CT  - largest in 73mmm Right upper lobe -stable march -> july 2019 ->July 2020 -> dec 2020  - likely bengin - next CT maybe in 1-2 years - decide at followup  Fluid level in esophagus - seeon CT dec 2020 - new finding  - support endoscopy by Dr Tarri Glenn in June 2021  Followup 3 months do spirometry pre and post BD and dLCO 3 months with Chase Caller

## 2019-12-04 NOTE — Progress Notes (Signed)
   PCP Neal, Jasmine K, MD   HPI  IOV 08/18/2017  Chief Complaint  Patient presents with  . pulmonary consult    SOB with activity wheezing with exercise and a cough in the morning.    74-year-old female whose husband died seen in 2015 for a lung nodule and he subsequently died from glioblastoma multiform A.  She is here as a new consult.  Her issues that she has had over 10 years of shortness of breath with exertion relieved by rest.  Insidious onset.  Slowly getting worse.  She also has cough with early morning wheezing.  The cough is dry but occasionally she has congestion.  Symptoms are indeed progressive.  Several years ago she was on Symbicort or Advair.  She does not remember if this improved her.  Then a few years ago she got changed to ANORO along with Asmanex.  6 months ago the Asmanex was stopped.  Overall these changes have not helped her.  She has learned to live with this. Vacuuming can  cause problems symptoms .  There is no associated chest pain.  She does not have hypertension or diabetes.  She is worried about the risk of lung cancer.  There is because a paternal grandfather and sister died from lung cancer.  Lab review shows that she has eosinophilia that is mild at 300 cells per cubic millimeter.  She has had previous allergy testing that was positive done by Dr. Kozlow.  Last CT scan of the chest was in 2006.  In May 2018 she had spirometry at the outside.  I personally visualized the trace.  FEV1 0.9 L / 38% FVC 46% with an obstructed ratio.  Exam nitric oxide today is elevated at 58 ppb. IN terms of smoking history, she smoke 1 ppd x 10 years but quit when she was 27.,> reports allrgy test 10 years ago as positive only for dust mites   OV 09/22/2017  Chief Complaint  Patient presents with  . Follow-up    PFT done, no asthma flare at this time    Jasmine Neal returns for follow-up.  She tells me that after starting Brio her symptoms are significantly improved.  In  fact asthma control questionnaire shows a symptom score of 0.5.  She is now waking up at night because of asthma symptoms.  When she wakes up she has no symptoms.  She feels very slightly limited in her activities only when walking uphill.  And she is actually has a very little shortness of breath but again only while walking uphill or climbing stairs.  She has never wheezed and not use albuterol for rescue at all.  Overall is a huge improvement.   Walking desaturation test on 09/22/2017 185 feet x 3 laps on ROOM AIR:  did no desaturate. Rest pulse ox was 100%, final pulse ox was 97%. HR response 69/min at rest to 93/min at peak exertion. Patient Jasmine Neal  Did not Desaturate < 88% . Jasmine Neal yes  Desaturated </= 3% points. Jasmine Neal yes did get tachyardic.  But she did drop pulse ox by 3 points.  In talking to her she tells me that she is got this fixed exertional dyspnea that is relieved by rest.  This may be slowly progressive.  There is no cough or wheezing.  She had pulmonary function test today and this shows significant severe obstruction and on bronchodilator responsiveness flow volume loop abnormalities.  The DLCO   was also reduced at 60%.  She is very surprised that her lung function abnormalities also severe because she does not feel the symptoms as much and she under perceives the symptoms relative to the lung function abnormality.  She is very keen on getting a CT scan of the chest because of family history of lung cancer.   OV 02/16/2018  Chief Complaint  Patient presents with  . Follow-up    5 month follow up to discuss test results.    Jasmine Neal eturns for follow-up of 2 issues  Moderate persistent asthma with fixed exertional dyspnea: She is tells me that with Brio her symptoms of improvement. Given the fixed exertional dyspnea is better. Overall asthma control questionnaire shows a score of 0.4 showing excellent control. She is not waking up in the middle of the  night with symptoms when she wakes up she has no symptoms. She is very slightly limited in her activities because of asthma and she's experienced very little shortness of breath. She does not have any wheeze or does not use albuterol for rescue  Follow-up multiple lung nodules: the largest in 9mmm RUL nodule and this stable march 2019  -> July 2019    IMPRESSION: CT chest Near complete re solution of ill-defined nodular opacity in medial left lower lobe, consistent with resolving inflammatory or infectious etiology.  Multiple other sub-cm bilateral pulmonary nodules remain stable, largest in right lung apex measuring 9 mm. Recommend continued follow-up by chest CT without contrast in 12 months. This recommendation follows the consensus statement: Guidelines for Management of Small Pulmonary Nodules Detected on CT Images: From the Fleischner Society 2017; Radiology 2017; 284:228-243.   Electronically Signed   By: John  Stahl M.D.   On: 01/23/2018 15:19   OV 03/26/2019  Subjective:  Patient ID: Jasmine Neal, female , DOB: 03/05/1946 , age 73 y.o. , MRN: [id number] , ADDRESS: [address] [place] [address]   03/26/2019 -   Chief Complaint  Patient presents with  . Moderate persistent asthma without complication    Discuss results of PFT      ICD-10-CM   1. Moderate persistent asthma without complication  J45.40 Tiotropium Bromide Monohydrate (SPIRIVA RESPIMAT) 1.25 MCG/ACT AERS  2. Multiple lung nodules on CT  R91.8 Tiotropium Bromide Monohydrate (SPIRIVA RESPIMAT) 1.25 MCG/ACT AERS     HPI Jasmine Neal 73 y.o. -presents for the above problems.  Visit is nearly after 1 year.  In the interim she is stable.  She is up-to-date with her vaccines.  July 2019 because of coronary artery calcifications and ongoing fixed exertional dyspnea she had a cardiac stress test I reviewed the result and this was normal.  She says the Brio is helping her but despite this she  continues to have fixed exertional dyspnea.  Her asthma control questionnaire is stable.  She had repeat lung function today it is stable and unchanged which is good but she still has severe fixed obstructive lung defect.  She has a family history of myasthenia gravis and her dad but she denies any end of the day fatigue or eyelid drooping symptoms.  Moreover the pulmonary function test is not restrictive.  There is no cough we discussed and she is interested in adding inhaler therapy.  She is not interested in second opinion for a fixed obstructive lung disease defect.   For multiple lung nodules: She has 1-1/2-year stability at this point.      CT Chest Lungs/Pleura: Multiple bilateral   pulmonary nodules, as before. Index nodule in the apical segment right upper lobe measures 7 mm (5 x 8 mm), stable. Post radiation scarring in the anterior subpleural right lung. No pleural fluid. Airway is unremarkable.  Upper Abdomen: Visualized portions of the liver, gallbladder, adrenal glands, kidneys, spleen, pancreas, stomach and bowel are unremarkable. No upper abdominal adenopathy.  Musculoskeletal: Degenerative changes in the spine. No worrisome lytic or sclerotic lesions. Question mild radiation changes in the anterolateral right ribs, as before. No associated fracture.  IMPRESSION: Bilateral pulmonary nodules are stable and likely benign.   Electronically Signed   By: Lorin Picket M.D.   On: 01/30/2019 13:17    OV 09/23/2019  Subjective:  Patient ID: Jasmine Neal, female , DOB: 08-Aug-1945 , age 20 y.o. , MRN: [id number] , ADDRESS: [place] [id number]   09/23/2019 -   Chief Complaint  Patient presents with  . Follow-up    Pt states she has been doing okay since last visit and denies any recent flare ups with her asthma.     HPI Jasmine Neal 74 y.o. -   Follow-up moderate persistent asthma: She has fixed obstructive disease that does  not respond to inhaler therapy although symptoms improved.  Therefore back in September 2020 I added Spiriva to the mix.  She says she only has still has mild dyspnea with fixed exertional relieved by rest.  It presents for walking an incline or stairs.  Otherwise she feels fine.  She does not think Spiriva has helped.  She was supposed to have pulmonary function test but because of COVID-19 pandemic this has been disrupted.  We do not have the data.  She said that she met anesthetist who said that he was surprised that she did not have a rescue inhaler.  She has never needed 1.  However I did agree with the anesthesiologist recommendation for her to carry an albuterol inhaler.  Therefore I settle make the prescription.  At this point in time she has had a Covid vaccine.  Pulmonary nodules: This is no stable for 2 years.  Results documented below.  I visualized and interpreted the CT scan myself.  There is report of emphysema on the CT scan but I could not appreciate it.  New issue: Fluid level in the esophagus: There is a new finding on the CT scan I personally visualized it.  She does have Dr. Tarri Glenn is a gastroenterologist.  Last seen towards the end of 2020 but before the CT scan.  There is no mention of any esophageal issues on Dr. Tarri Glenn medical record.  However this finding has postdated that visit.  I have asked the patient to take this up with Dr. Tarri Glenn.       IMPRESSION: CT CHEST 1. Ongoing stability of bilateral pulmonary nodules, most consistent favoring a benign etiology. 2. Right axillary node dissection, without findings of thoracic metastatic disease. 3. Aortic atherosclerosis (ICD10-I70.0), coronary artery atherosclerosis and emphysema (ICD10-J43.9). 4. Esophageal air fluid level suggests dysmotility or gastroesophageal reflux.   Electronically Signed   By: Abigail Miyamoto M.D.   On: 06/25/2019 13:32 ROS - per HPI    OV 12/04/2019  Subjective:  Patient ID: Jasmine Neal, female , DOB: 12-May-1946 , age 73 y.o. , MRN: [id number] , ADDRESS: [place] [address]   12/04/2019 -   Chief Complaint  Patient presents with  . Follow-up    pt is here to go over spiro results.CT  of chestlung nodule. pt states gerd. pt has endoscopy schedule for jun 3rrd   Followup fixed obstrucitve lung disease  HPI Jasmine Neal 74 y.o.  - returns for followup. Continue spiriva and breo. No definite improvement in symptoms since spiriva. Overall stable/same. Has dyspnea for stairs and gardening but able to cope. Not a big issues. No flare ups. No prednisone use. No ER visit. She is interested in tapering down her medication regimen but was interested in hearing other therapeutic options. We discussed biologic due to her increased eos as a way to improve symptoms ( would be  A time limited trial) but she is not interested.Marland Kitchen PFT show improvement in fev1 but reduction in dlco - overall on average is stable. No new issues.    In terms of her esophageal issues - she is having EGD in June 2021 by Dr Albin Felling  Asthma Control Panel eos 300 always - last 2018, 100 in march 2019 and 400 in aug 2017. IgE 8 - march 2019 12/06/2016 08/18/2017  09/22/2017  02/16/2018  03/26/2019  09/23/2019   Current Med Regimen  anoro breo breo breo breo and spiriva  ACT 22       ACQ 5 point- 1 week. wtd avg score. <1.0 is good control 0.75-1.25 is grey zone. >1.25 poor control. Delta 0.5 is clinically meaningful  12/5 = 2.4 0.5 0.4 0.4 0.4  FeNO ppB  58  25    FeV1   0.96L/38% in May 2018 outside 1.11/425% -> 0.79/30% ratop 65 -> 39, DLCO 18.33/61%  1.14L/43% and ratio 52   Planned intervention  for visit  Change anoro to breo low dose breo + get HRCT + blood allergy panel -. Did only IgE - normal 8        Asthma Control Test ACT Total Score  12/06/2016 22      PFT REPORT  Results for SHAMON, LOBO (MRN [id number]) as of 12/04/2019 11:53  Ref. Range  09/22/2017 09:55 03/26/2019 08:48 11/29/2019 13:37  FEV1-Pre Latest Units: L 1.11 1.14 1.26  FEV1-%Pred-Pre Latest Units: % 42 43 48  Pre FEV1/FVC ratio Latest Units: % 49 52 55   Results for BAYYINAH, DUKEMAN (MRN [id number]) as of 12/04/2019 11:53  Ref. Range 09/22/2017 09:55 03/26/2019 08:48 11/29/2019 13:37  DLCO unc Latest Units: ml/min/mmHg 18.33 17.13 16.30  DLCO unc % pred Latest Units: % 61 75 72    ROS - per HPI  IMPRESSION:  CT chest 1. Ongoing stability of bilateral pulmonary nodules, most consistent favoring a benign etiology. 2. Right axillary node dissection, without findings of thoracic metastatic disease. 3. Aortic atherosclerosis (ICD10-I70.0), coronary artery atherosclerosis and emphysema (ICD10-J43.9). 4. Esophageal air fluid level suggests dysmotility or gastroesophageal reflux.   Electronically Signed   By: Abigail Miyamoto M.D.   On: 06/25/2019 13:32   has a past medical history of Anxiety, Asthma, Breast cancer ([place]), Cataract, Chronic airway obstruction, not elsewhere classified, Coronary artery calcification seen on CAT scan (01/23/2018), Diverticulosis, Dysplasia, Fatty liver, Fibromyalgia, nonmelanoma skin cancer, Hyperlipidemia, Hypothyroidism, Malignant neoplasm of breast (female), unspecified site (11/28/2013), Osteoarthritis, Osteopenia, REM sleep behavior disorder, and Skin cancer.   reports that she quit smoking about 45 years ago. Her smoking use included cigarettes. She has a 8.00 pack-year smoking history. She has never used smokeless tobacco.  Past Surgical History:  Procedure Laterality Date  . BREAST LUMPECTOMY     on right, 1990 IIA radiation cmfp ajuction rx  . COLONOSCOPY    .  KNEE SURGERY  2012  . NASAL SINUS SURGERY  2007   on right done by Dr. Wilburn Cornelia    Allergies  Allergen Reactions  . Hydrocodone Itching    Immunization History  Administered Date(s) Administered  . Fluad Quad(high Dose 65+) 03/12/2019  . Hepatitis A  08/30/2017  . Influenza Whole 04/10/2002, 06/01/2009, 05/05/2010, 05/22/2012, 02/20/2017  . Influenza, High Dose Seasonal PF 04/22/2016  . Influenza,inj,Quad PF,6+ Mos 06/04/2013  . Influenza-Unspecified 05/23/2011  . PFIZER SARS-COV-2 Vaccination 08/03/2019, 08/24/2019  . Pneumococcal Conjugate-13 10/09/2013  . Pneumococcal Polysaccharide-23 05/05/2010  . Td 08/11/2006, 02/27/2017  . Typhoid Inactivated 09/07/2017  . Zoster 09/11/2012  . Zoster Recombinat (Shingrix) 06/06/2018, 03/14/2019    Family History  Problem Relation Age of Onset  . Other Father        mysthenia gravis  . Lung cancer Sister   . Osteoporosis Other   . Thyroid disease Other   . Breast cancer Mother 50       possible uterine cancer diagnosis at age 71  . Osteoporosis Mother        no hip fracture  . Breast cancer Sister 59  . Lung cancer Sister 54       former smoker  . Lung cancer Paternal Grandmother   . Colon cancer Neg Hx   . Esophageal cancer Neg Hx   . Stomach cancer Neg Hx   . Rectal cancer Neg Hx      Current Outpatient Medications:  .  acetaminophen (TYLENOL) 500 MG tablet, Take 1,000 mg by mouth daily as needed. pain , Disp: , Rfl:  .  albuterol (VENTOLIN HFA) 108 (90 Base) MCG/ACT inhaler, Inhale 2 puffs into the lungs every 6 (six) hours as needed for wheezing or shortness of breath., Disp: 8 g, Rfl: 5 .  atorvastatin (LIPITOR) 40 MG tablet, Take 1 tablet (40 mg total) by mouth daily. (Patient taking differently: Take 20 mg by mouth daily. ), Disp: 90 tablet, Rfl: 3 .  Cholecalciferol (VITAMIN D3) 50 MCG (2000 UT) TABS, Take by mouth daily., Disp: , Rfl:  .  diphenhydramine-acetaminophen (TYLENOL PM) 25-500 MG TABS, Take 1 tablet by mouth at bedtime as needed. sleep, Disp: , Rfl:  .  fluticasone furoate-vilanterol (BREO ELLIPTA) 100-25 MCG/INH AEPB, Inhale 1 puff into the lungs daily., Disp: 180 each, Rfl: 3 .  PARoxetine (PAXIL) 40 MG tablet, Take 40 mg by mouth at bedtime. , Disp: , Rfl:    .  SYNTHROID 88 MCG tablet, Take 1 tablet (88 mcg total) by mouth daily., Disp: 90 tablet, Rfl: 0 .  Tiotropium Bromide Monohydrate (SPIRIVA RESPIMAT) 1.25 MCG/ACT AERS, Inhale 2 puffs into the lungs daily., Disp: 12 g, Rfl: 3 .  valACYclovir (VALTREX) 1000 MG tablet, TAKE 2 TABLETS BY MOUTH TWICE DAILY AS DIRECTED (Patient taking differently: Take 1,000 mg by mouth 3 times/day as needed-between meals & bedtime. TAKE 2 TABLETS BY MOUTH TWICE DAILY AS DIRECTED as needed for cold sores), Disp: 90 tablet, Rfl: prn .  vitamin B-12 (CYANOCOBALAMIN) 1000 MCG tablet, Take 1,000 mcg by mouth daily., Disp: , Rfl:   Current Facility-Administered Medications:  .  0.9 %  sodium chloride infusion, 500 mL, Intravenous, Once, Thornton Park, MD      Objective:   Vitals:   12/04/19 1138  BP: 126/74  Pulse: 82  Temp: 98.2 F (36.8 C)  TempSrc: Oral  SpO2: 93%  Weight: 160 lb 6.4 oz (72.8 kg)  Height: '5\' 9"'$  (1.753 m)    Estimated  body mass index is 23.69 kg/m as calculated from the following:   Height as of this encounter: '5\' 9"'$  (1.753 m).   Weight as of this encounter: 160 lb 6.4 oz (72.8 kg).  '@WEIGHTCHANGE'$ @  [employer]   12/04/19 1138  Weight: 160 lb 6.4 oz (72.8 kg)     Physical Exam Aox3 No neck nodes. No thrush CTA bilaterally Normal heart sound Abd soft No cyanosis, No clubbing. No edema         Assessment:       ICD-10-CM   1. Moderate persistent asthma without complication  S39.26 Pulmonary function test       Plan:     Patient Instructions  Moderate persistent asthma without complication  - stable and well controlled - breathing test on average is stable over time but still with fixed lung obstruction  Plan  -We discussed adding biologic therapy but at this point because of lack of exacerbation and the only quality gain would be climbing hills or doing gardening we going to hold this off -Continue  albuterol as needed - you do need rescue inhaler -  continue breo as before  -Stop Spiriva as part of an effort to reduce your overall medication load  - do spirometry and BD response and dlco in 3 months  - -If lung function and symptoms are getting worse at follow-up then will have to add this back   Multiple lung nodules on CT  - largest in 53mm Right upper lobe -stable march -> july 2019 ->July 2020 -> dec 2020  - likely bengin - next CT maybe in 1-2 years - decide at followup  Fluid level in esophagus - seeon CT dec 2020 - new finding  - support endoscopy by Dr BTarri Glennin June 2021  Followup 3 months do spirometry pre and post BD and dLCO 3 months with Larenda Reedy   (Level 04: Estb 30-39 min  visit type: on-site physical face to visit visit spent in total care time and counseling or/and coordination of care by this undersigned MD - Dr MBrand Males This includes one or more of the following on this same day 12/04/2019: pre-charting, chart review, note writing, documentation discussion of test results, diagnostic or treatment recommendations, prognosis, risks and benefits of management options, instructions, education, compliance or risk-factor reduction. It excludes time spent by the [facility] office staff in the care of the patient . Actual time is 30 min)    SIGNATURE    Dr. MBrand Males M.D., F.C.C.P,  Pulmonary and Critical Care Medicine Staff Physician, CAvillaDirector - Interstitial Lung Disease  Program  [place]ulmonary [place] [id number] Pager: [phone] If no answer or between  15:00h - 7:00h: call [phone] Telephone: [phone]  4:05 PM 12/04/2019

## 2019-12-18 ENCOUNTER — Ambulatory Visit (AMBULATORY_SURGERY_CENTER): Payer: Self-pay | Admitting: *Deleted

## 2019-12-18 ENCOUNTER — Other Ambulatory Visit: Payer: Self-pay

## 2019-12-18 VITALS — Ht 69.0 in | Wt 161.0 lb

## 2019-12-18 DIAGNOSIS — R933 Abnormal findings on diagnostic imaging of other parts of digestive tract: Secondary | ICD-10-CM

## 2019-12-18 NOTE — Progress Notes (Signed)

## 2020-01-01 ENCOUNTER — Other Ambulatory Visit: Payer: Self-pay

## 2020-01-01 ENCOUNTER — Encounter: Payer: Self-pay | Admitting: Gastroenterology

## 2020-01-01 ENCOUNTER — Ambulatory Visit (AMBULATORY_SURGERY_CENTER): Payer: Medicare PPO | Admitting: Gastroenterology

## 2020-01-01 VITALS — BP 147/68 | HR 65 | Temp 97.5°F | Resp 19 | Ht 69.0 in | Wt 161.0 lb

## 2020-01-01 DIAGNOSIS — K219 Gastro-esophageal reflux disease without esophagitis: Secondary | ICD-10-CM

## 2020-01-01 DIAGNOSIS — K2100 Gastro-esophageal reflux disease with esophagitis, without bleeding: Secondary | ICD-10-CM | POA: Diagnosis not present

## 2020-01-01 DIAGNOSIS — R933 Abnormal findings on diagnostic imaging of other parts of digestive tract: Secondary | ICD-10-CM

## 2020-01-01 DIAGNOSIS — K228 Other specified diseases of esophagus: Secondary | ICD-10-CM

## 2020-01-01 DIAGNOSIS — K200 Eosinophilic esophagitis: Secondary | ICD-10-CM

## 2020-01-01 MED ORDER — SODIUM CHLORIDE 0.9 % IV SOLN
500.0000 mL | Freq: Once | INTRAVENOUS | Status: DC
Start: 2020-01-01 — End: 2022-10-25

## 2020-01-01 NOTE — Op Note (Signed)
[facility] Patient Name: Jasmine Neal Procedure Date: 01/01/2020 10:07 AM MRN: [id number] Endoscopist: Thornton Park MD, MD Age: 74 Referring MD:  Date of Birth: 1946/02/23 Gender: Female Account #: [id number] Procedure:                Upper GI endoscopy Indications:              Abnormal CT of the GI tract Medicines:                Monitored Anesthesia Care Procedure:                Pre-Anesthesia Assessment:                           - Prior to the procedure, a History and Physical                            was performed, and patient medications and                            allergies were reviewed. The patient's tolerance of                            previous anesthesia was also reviewed. The risks                            and benefits of the procedure and the sedation                            options and risks were discussed with the patient.                            All questions were answered, and informed consent                            was obtained. Prior Anticoagulants: The patient has                            taken no previous anticoagulant or antiplatelet                            agents. ASA Grade Assessment: II - A patient with                            mild systemic disease. After reviewing the risks                            and benefits, the patient was deemed in                            satisfactory condition to undergo the procedure.                           After obtaining informed consent, the endoscope was  passed under direct vision. Throughout the                            procedure, the patient's blood pressure, pulse, and                            oxygen saturations were monitored continuously. The                            Endoscope was introduced through the mouth, and                            advanced to the third part of duodenum. The upper                            GI endoscopy was  accomplished without difficulty.                            The patient tolerated the procedure well. Scope In: Scope Out: Findings:                 The examined esophagus was normal. Biopsies were                            obtained from the proximal and distal esophagus                            with cold forceps for histology.                           The stomach was normal.                           The examined duodenum was normal.                           The cardia and gastric fundus were normal on                            retroflexion. Complications:            No immediate complications. Estimated blood loss:                            Minimal. Estimated Blood Loss:     Estimated blood loss was minimal. Impression:               - Normal esophagus. Biopsied.                           - Normal stomach.                           - Normal examined duodenum. Recommendation:           - Patient has a contact number available for  emergencies. The signs and symptoms of potential                            delayed complications were discussed with the                            patient. Return to normal activities tomorrow.                            Written discharge instructions were provided to the                            patient.                           - Resume previous diet.                           - Continue present medications.                           - Await pathology results.                           - No surveillance upper endoscopy recommended at                            this time. Thornton Park MD, MD 01/01/2020 10:28:26 AM This report has been signed electronically.

## 2020-01-01 NOTE — Patient Instructions (Signed)
Await pathology results from the biopsies taken today.  Resume previous diet and medications.   YOU HAD AN ENDOSCOPIC PROCEDURE TODAY AT THE [facility]:   Refer to the procedure report that was given to you for any specific questions about what was found during the examination.  If the procedure report does not answer your questions, please call your gastroenterologist to clarify.  If you requested that your care partner not be given the details of your procedure findings, then the procedure report has been included in a sealed envelope for you to review at your convenience later.  YOU SHOULD EXPECT: Some feelings of bloating in the abdomen. Passage of more gas than usual.  Walking can help get rid of the air that was put into your GI tract during the procedure and reduce the bloating. If you had a lower endoscopy (such as a colonoscopy or flexible sigmoidoscopy) you may notice spotting of blood in your stool or on the toilet paper. If you underwent a bowel prep for your procedure, you may not have a normal bowel movement for a few days.  Please Note:  You might notice some irritation and congestion in your nose or some drainage.  This is from the oxygen used during your procedure.  There is no need for concern and it should clear up in a day or so.  SYMPTOMS TO REPORT IMMEDIATELY:   Following upper endoscopy (EGD)  Vomiting of blood or coffee ground material  New chest pain or pain under the shoulder blades  Painful or persistently difficult swallowing  New shortness of breath  Fever of 100F or higher  Black, tarry-looking stools  For urgent or emergent issues, a gastroenterologist can be reached at any hour by calling [phone]. Do not use MyChart messaging for urgent concerns.    DIET:  We do recommend a small meal at first, but then you may proceed to your regular diet.  Drink plenty of fluids but you should avoid alcoholic beverages for 24 hours.  ACTIVITY:  You  should plan to take it easy for the rest of today and you should NOT DRIVE or use heavy machinery until tomorrow (because of the sedation medicines used during the test).    FOLLOW UP: Our staff will call the number listed on your records 48-72 hours following your procedure to check on you and address any questions or concerns that you may have regarding the information given to you following your procedure. If we do not reach you, we will leave a message.  We will attempt to reach you two times.  During this call, we will ask if you have developed any symptoms of COVID 19. If you develop any symptoms (ie: fever, flu-like symptoms, shortness of breath, cough etc.) before then, please call [phone].  If you test positive for Covid 19 in the 2 weeks post procedure, please call and report this information to us.    If any biopsies were taken you will be contacted by phone or by letter within the next 1-3 weeks.  Please call us at [phone] if you have not heard about the biopsies in 3 weeks.    SIGNATURES/CONFIDENTIALITY: You and/or your care partner have signed paperwork which will be entered into your electronic medical record.  These signatures attest to the fact that that the information above on your After Visit Summary has been reviewed and is understood.  Full responsibility of the confidentiality of this discharge information lies with you and/or   your care-partner. 

## 2020-01-01 NOTE — Progress Notes (Signed)
Pt's states no medical or surgical changes since previsit or office visit. 

## 2020-01-01 NOTE — Progress Notes (Signed)
Called to room to assist during endoscopic procedure.  Patient ID and intended procedure confirmed with present staff. Received instructions for my participation in the procedure from the performing physician.  

## 2020-01-01 NOTE — Progress Notes (Signed)
A/ox3, pleased with MAC, report to RN 

## 2020-01-03 ENCOUNTER — Telehealth: Payer: Self-pay

## 2020-01-03 ENCOUNTER — Telehealth: Payer: Self-pay | Admitting: *Deleted

## 2020-01-03 NOTE — Telephone Encounter (Signed)
Attempted to reach pt. With follow-up call following endoscopic procedure 01/01/2020.  LM on pt. Voice mail to call if she has any questions or concerns.

## 2020-01-03 NOTE — Telephone Encounter (Signed)
Follow up call made, no answer.

## 2020-01-13 IMAGING — CT CT CHEST W/O CM
2 of 3 series · 15 of 36 positions shown, 18 images · non-contrast
Comparison: 01/30/2019

CLINICAL DATA: Follow-up of lung nodule. History of asthma. Breast
cancer.

EXAM:
CT CHEST WITHOUT CONTRAST
TECHNIQUE: Multidetector CT imaging of the chest was performed following the
standard protocol without IV contrast.

[Series 2: thorax · axial · 0.63mm/px · z∈[-324,-54]mm · 12 of 159 slices shown, 15 images]
[im 12/159  mediastinal]
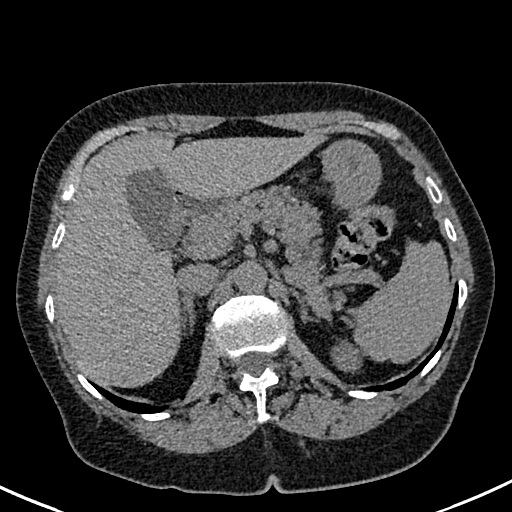
[im 12/159  lung]
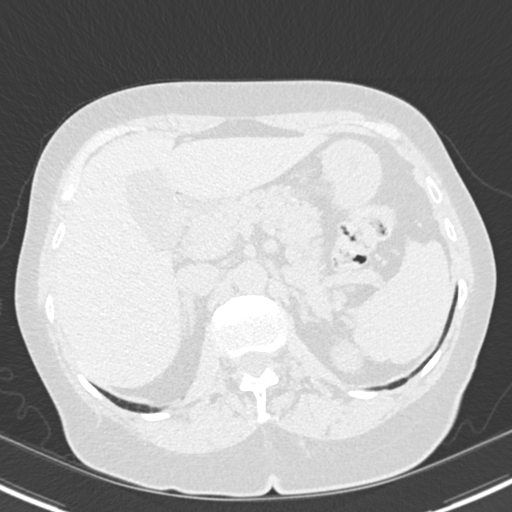
[im 24/159  lung]
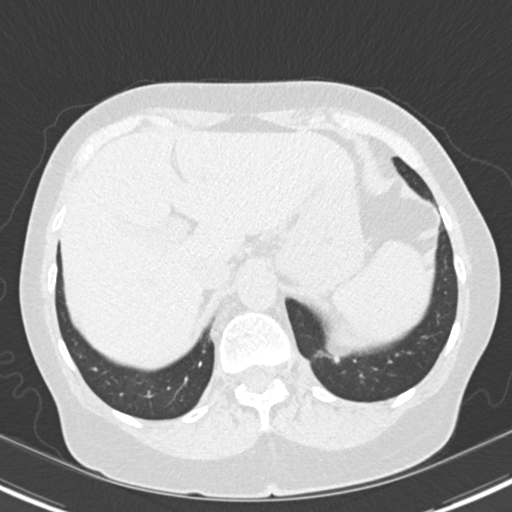
[im 36/159  lung]
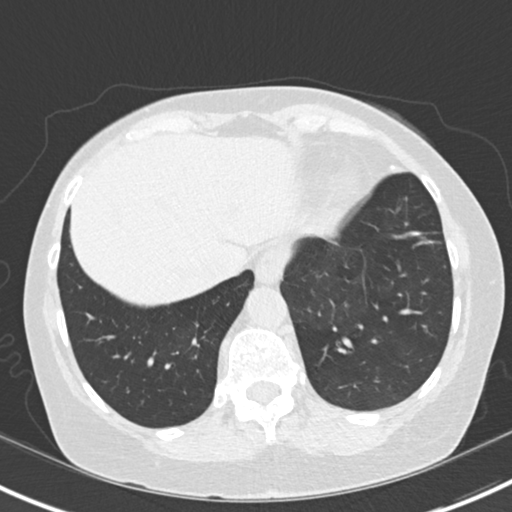
[im 47/159  lung]
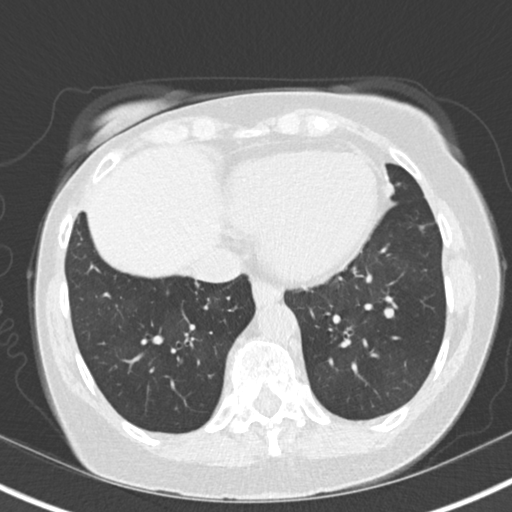
[im 59/159  mediastinal]
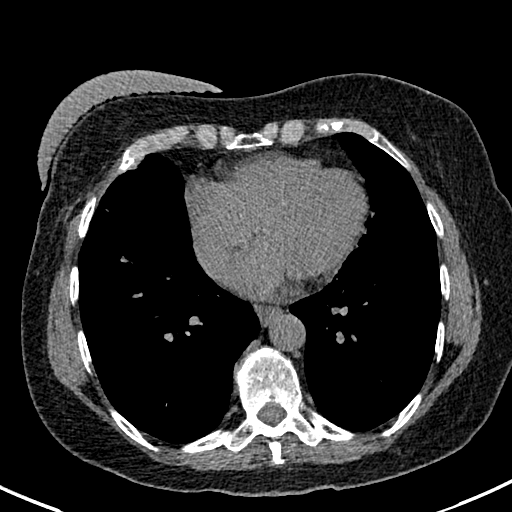
[im 59/159  lung]
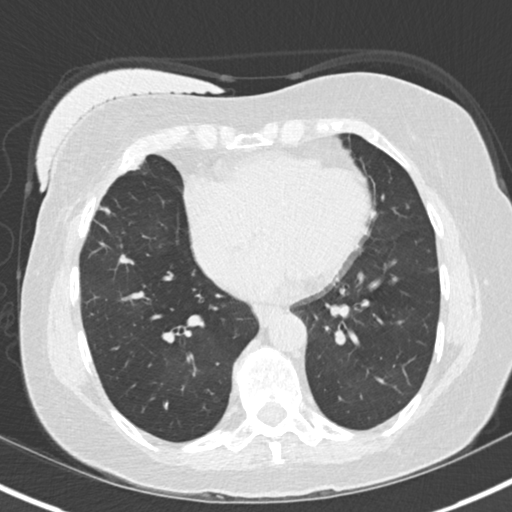
[im 71/159  lung]
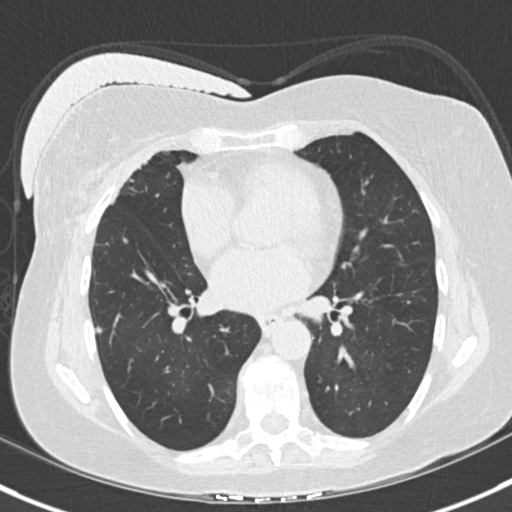
[im 88/159  lung]
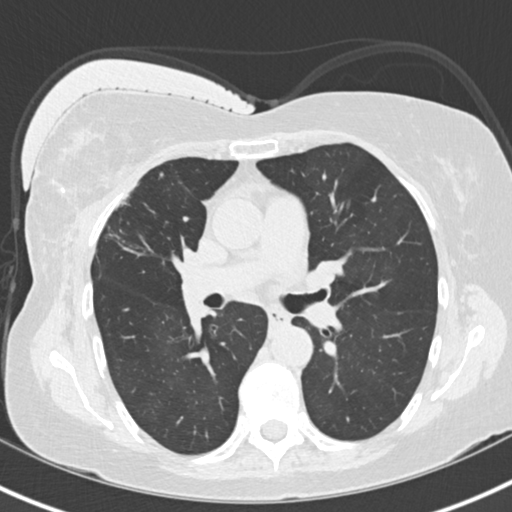
[im 100/159  lung]
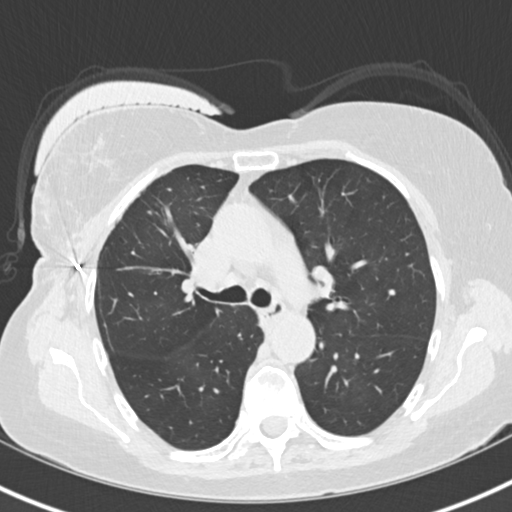
[im 112/159  mediastinal]
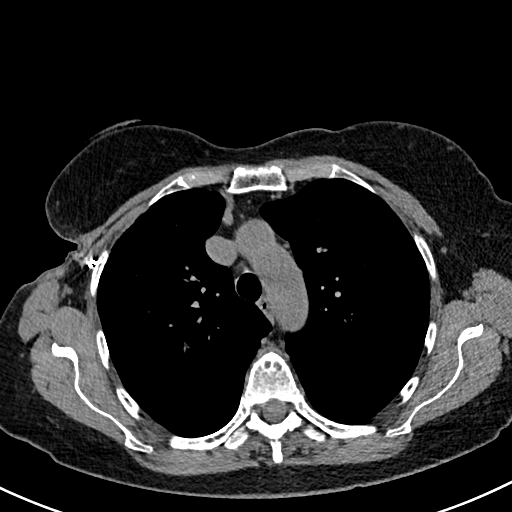
[im 112/159  lung]
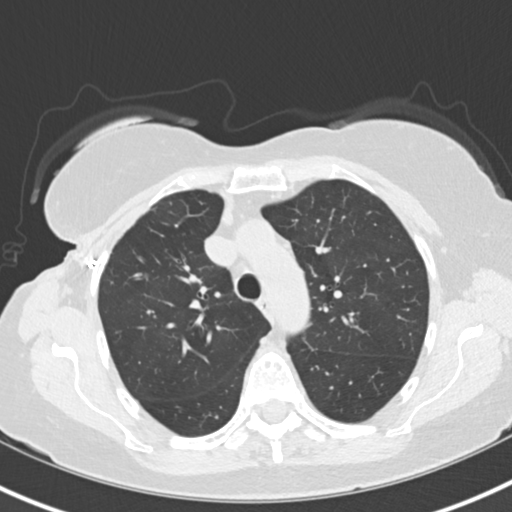
[im 123/159  lung]
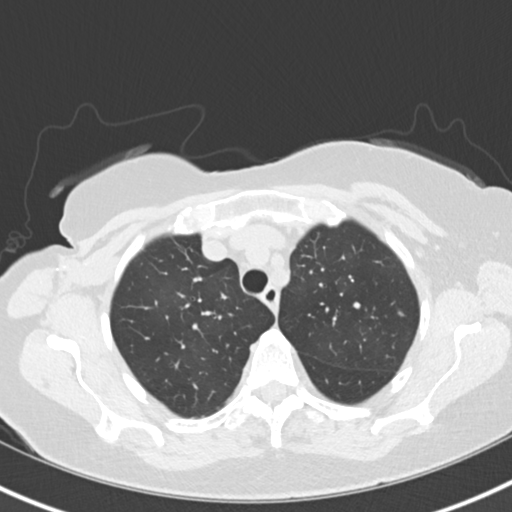
[im 135/159  lung]
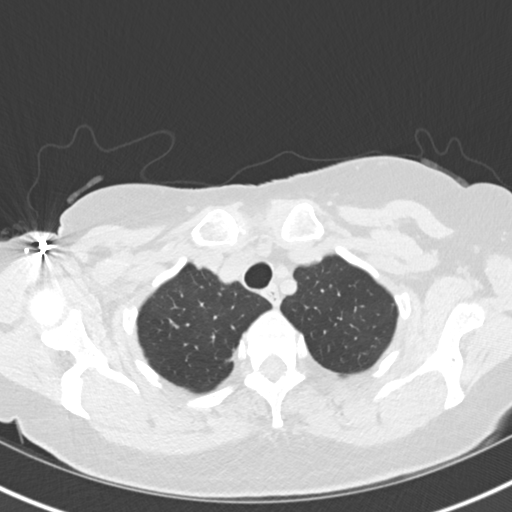
[im 147/159  lung]
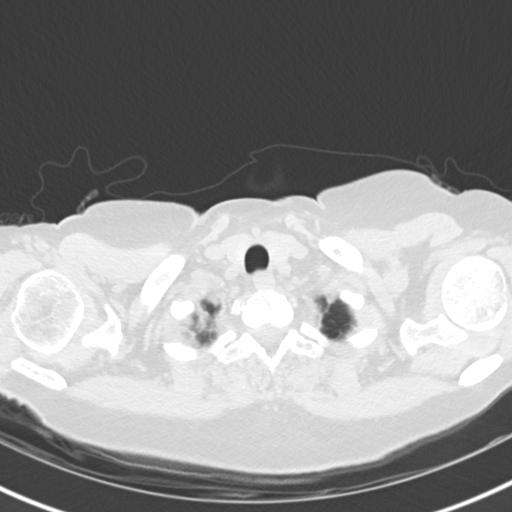

[Series 5: coronal · coronal · 0.62mm/px · 3 of 125 slices shown]
[im 25/125  lung]
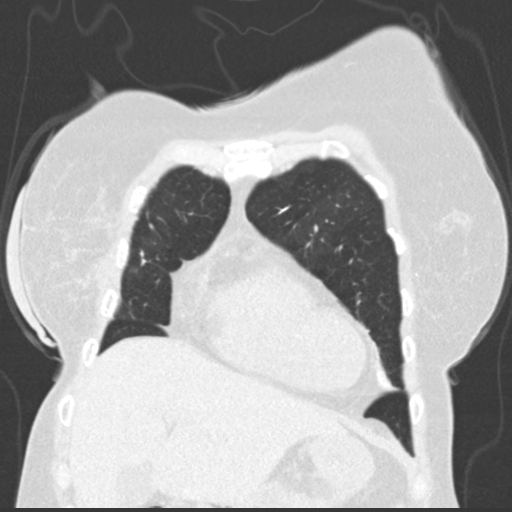
[im 50/125  lung]
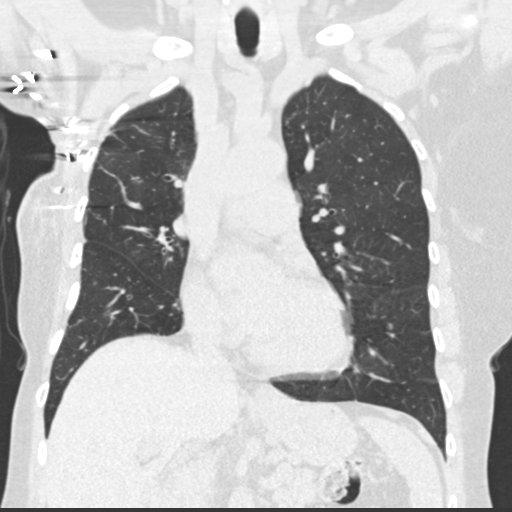
[im 75/125  lung]
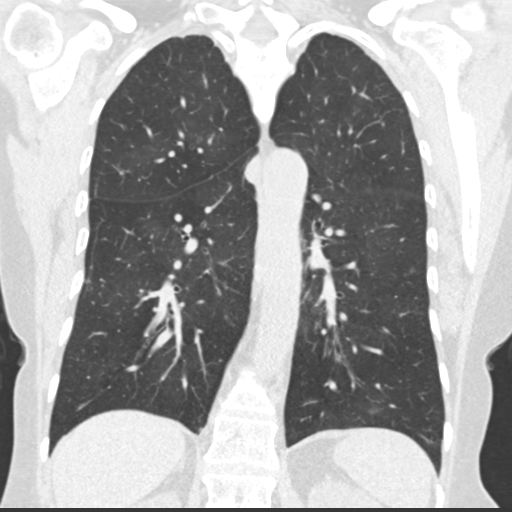

[15 of 36 positions shown; findings below may reference images not displayed]

FINDINGS: Cardiovascular: Normal caliber of the aorta and branch vessels.
Normal heart size, without pericardial effusion. Lad coronary artery
atherosclerosis. Mild lower thoracic aortic atherosclerosis.

Mediastinum/Nodes: Right axillary node dissection. No axillary
adenopathy. No mediastinal or definite hilar adenopathy, given
limitations of unenhanced CT.

Subtle fluid level in the esophagus on 60/2.

Lungs/Pleura: No pleural fluid. Mild centrilobular emphysema.
Anterior right upper lobe radiation fibrosis.

Multiple bilateral pulmonary nodules, many of which are identified
on series 3. Index right apical pulmonary nodule is similar at 7 mm
on [DATE].

Index left lower lobe pulmonary nodule is similar at 5 mm on 113/3.

Upper Abdomen: Normal imaged portions of the liver, spleen, stomach,
pancreas, gallbladder, adrenal glands, kidneys.

Musculoskeletal: Presumed sebaceous cyst about the posterolateral
the left chest wall, including at 1.2 cm. Moderate thoracic
spondylosis.
IMPRESSION: 1. Ongoing stability of bilateral pulmonary nodules, most consistent
favoring a benign etiology.
2. Right axillary node dissection, without findings of thoracic
metastatic disease.
3. Aortic atherosclerosis ([id number]), coronary artery
atherosclerosis and emphysema ([id number]).
4. Esophageal air fluid level suggests dysmotility or
gastroesophageal reflux.

## 2020-01-15 ENCOUNTER — Other Ambulatory Visit: Payer: Self-pay

## 2020-01-15 DIAGNOSIS — K219 Gastro-esophageal reflux disease without esophagitis: Secondary | ICD-10-CM

## 2020-01-15 MED ORDER — PANTOPRAZOLE SODIUM 40 MG PO TBEC: DELAYED_RELEASE_TABLET | ORAL | 0 refills | Status: DC

## 2020-01-30 ENCOUNTER — Other Ambulatory Visit: Payer: Self-pay | Admitting: Internal Medicine

## 2020-01-30 ENCOUNTER — Telehealth: Payer: Self-pay | Admitting: Internal Medicine

## 2020-01-30 DIAGNOSIS — E7800 Pure hypercholesterolemia, unspecified: Secondary | ICD-10-CM

## 2020-01-30 DIAGNOSIS — Z853 Personal history of malignant neoplasm of breast: Secondary | ICD-10-CM

## 2020-01-30 DIAGNOSIS — Z79899 Other long term (current) drug therapy: Secondary | ICD-10-CM

## 2020-01-30 DIAGNOSIS — J4540 Moderate persistent asthma, uncomplicated: Secondary | ICD-10-CM

## 2020-01-30 DIAGNOSIS — E039 Hypothyroidism, unspecified: Secondary | ICD-10-CM

## 2020-01-30 DIAGNOSIS — G4750 Parasomnia, unspecified: Secondary | ICD-10-CM

## 2020-01-30 NOTE — Telephone Encounter (Signed)
Pt has scheduled a physical in September and is requesting a lab order so she can come in a week before to have labs done. Pt says, that Dr. Regis Bill, said, it was ok for pt to come in a week before her physical and for [place] to send her a message. Thanks

## 2020-01-30 NOTE — Addendum Note (Signed)
Addended byShanon Ace K on: 01/30/2020 11:02 AM   Modules accepted: Orders

## 2020-01-30 NOTE — Telephone Encounter (Signed)
Orders placed.

## 2020-01-30 NOTE — Telephone Encounter (Signed)
Please see message. °

## 2020-01-31 NOTE — Telephone Encounter (Signed)
MyChart message has been sent to patient for her to call and schedule a lab appointment a week before her physical on 03/30/2020.

## 2020-03-20 ENCOUNTER — Other Ambulatory Visit: Payer: Self-pay

## 2020-03-23 ENCOUNTER — Other Ambulatory Visit: Payer: Medicare PPO

## 2020-03-23 ENCOUNTER — Other Ambulatory Visit: Payer: Self-pay

## 2020-03-23 DIAGNOSIS — Z79899 Other long term (current) drug therapy: Secondary | ICD-10-CM

## 2020-03-23 DIAGNOSIS — Z853 Personal history of malignant neoplasm of breast: Secondary | ICD-10-CM

## 2020-03-23 DIAGNOSIS — G4750 Parasomnia, unspecified: Secondary | ICD-10-CM

## 2020-03-23 DIAGNOSIS — E039 Hypothyroidism, unspecified: Secondary | ICD-10-CM

## 2020-03-23 DIAGNOSIS — J4540 Moderate persistent asthma, uncomplicated: Secondary | ICD-10-CM

## 2020-03-23 DIAGNOSIS — E7800 Pure hypercholesterolemia, unspecified: Secondary | ICD-10-CM

## 2020-03-24 LAB — BASIC METABOLIC PANEL WITH GFR
BUN: 19 mg/dL (ref 7–25)
CO2: 28 mmol/L (ref 20–32)
Calcium: 9.6 mg/dL (ref 8.6–10.4)
Chloride: 104 mmol/L (ref 98–110)
Creat: 0.89 mg/dL (ref 0.60–0.93)
GFR, Est African American: 73 mL/min/{1.73_m2} (ref >=60)
GFR, Est Non African American: 63 mL/min/{1.73_m2} (ref >=60)
Glucose, Bld: 91 mg/dL (ref 65–99)
Potassium: 3.8 mmol/L (ref 3.5–5.3)
Sodium: 140 mmol/L (ref 135–146)

## 2020-03-24 LAB — CBC WITH DIFFERENTIAL/PLATELET
Absolute Monocytes: 362 cells/uL (ref 200–950)
Basophils Absolute: 42 cells/uL (ref 0–200)
Basophils Relative: 0.9 %
Eosinophils Absolute: 221 cells/uL (ref 15–500)
Eosinophils Relative: 4.7 %
HCT: 42.4 % (ref 35.0–45.0)
Hemoglobin: 13.6 g/dL (ref 11.7–15.5)
Lymphs Abs: 1283 cells/uL (ref 850–3900)
MCH: 30.2 pg (ref 27.0–33.0)
MCHC: 32.1 g/dL (ref 32.0–36.0)
MCV: 94.2 fL (ref 80.0–100.0)
MPV: 11 fL (ref 7.5–12.5)
Monocytes Relative: 7.7 %
Neutro Abs: 2792 cells/uL (ref 1500–7800)
Neutrophils Relative %: 59.4 %
Platelets: 166 10*3/uL (ref 140–400)
RBC: 4.5 10*6/uL (ref 3.80–5.10)
RDW: 11.9 % (ref 11.0–15.0)
Total Lymphocyte: 27.3 %
WBC: 4.7 10*3/uL (ref 3.8–10.8)

## 2020-03-24 LAB — TSH: TSH: 0.63 mIU/L (ref 0.40–4.50)

## 2020-03-24 LAB — HEPATIC FUNCTION PANEL
AG Ratio: 1.8 (calc) (ref 1.0–2.5)
ALT: 17 U/L (ref 6–29)
AST: 17 U/L (ref 10–35)
Albumin: 4.2 g/dL (ref 3.6–5.1)
Alkaline phosphatase (APISO): 78 U/L (ref 37–153)
Bilirubin, Direct: 0.1 mg/dL (ref 0.0–0.2)
Globulin: 2.3 g/dL (calc) (ref 1.9–3.7)
Indirect Bilirubin: 0.6 mg/dL (calc) (ref 0.2–1.2)
Total Bilirubin: 0.7 mg/dL (ref 0.2–1.2)
Total Protein: 6.5 g/dL (ref 6.1–8.1)

## 2020-03-24 LAB — LIPID PANEL
Cholesterol: 147 mg/dL (ref <200)
HDL: 57 mg/dL (ref >=50)
LDL Cholesterol (Calc): 76 mg/dL (calc)
Non-HDL Cholesterol (Calc): 90 mg/dL (calc) (ref <130)
Total CHOL/HDL Ratio: 2.6 (calc) (ref <5.0)
Triglycerides: 60 mg/dL (ref <150)

## 2020-03-27 NOTE — Progress Notes (Signed)
Chief Complaint  Patient presents with  . Annual Exam    Doing okay, breathing difficulty and wants to discuss    HPI: Jasmine Neal 74 y.o. comes in today for Preventive Medicare exam/ wellness visit .Since last visit.   Saw Pulmonary  Mid October .   DOE   May have noted  Dec resp tolerance  off   Inhaler .  Considering  Taking in  [place] women refugee    Ask about health issues  Immunizations  May liver with her until gets housing   Back on paxil helped the sleep activity  HLD :needs  atorva  Refill on 20 mg  Thyroid :   Takes daily   On PPI  For  A trial    Health Maintenance  Topic Date Due  . TETANUS/TDAP  02/28/2027  . COLONOSCOPY  08/13/2029  . INFLUENZA VACCINE  Completed  . DEXA SCAN  Completed  . COVID-19 Vaccine  Completed  . Hepatitis C Screening  Completed  . PNA vac Low Risk Adult  Completed   Health Maintenance Review LIFESTYLE:  Exercise:     Walks dog q day .   Tobacco/ETS: n Alcohol:  Drinking 2 per day.  Sugar beverages: Sleep:   A lot    Out of bed .  Drug use: no HH:  1   1 dog. May take     Hearing: poss right adequate   Vision:  No limitations at present . Last eye check                                                                                                                       Safety:  Has smoke detector and wears seat belts.  No excess sun exposure. Sees dentist regularly.  Falls:  no  Memory: Felt to be good  , no concern from her or her family.  Depression: No anhedonia unusual crying or depressive symptoms  Nutrition: Eats well balanced diet; adequate calcium and vitamin D. No swallowing chewing problems.  Injury: no major injuries in the last six months.  Other healthcare providers:  Reviewed today .  ADLS:   There are no problems or need for assistance  driving, feeding, obtaining food, dressing, toileting and bathing, managing money using phone. She is independent.    ROS:  GEN/ HEENT: No fever,  significant weight changes sweats headaches vision problems hearing changes, CV/ PULM; No chest pain shortness of breath cough, syncope,edema  change in exercise tolerance. GI /GU: No adominal pain, vomiting, change in bowel habits. No blood in the stool. No significant GU symptoms. SKIN/HEME: ,no acute skin rashes suspicious lesions or bleeding. No lymphadenopathy, nodules, masses.  NEURO/ PSYCH:  No neurologic signs such as weakness numbness. No depression anxiety. IMM/ Allergy: No unusual infections.  Allergy .   REST of 12 system review negative except as per HPI   Past Medical History:  Diagnosis Date  . Anxiety   . Asthma  evalutated by pulmonary severe by spirometry no smoker currently  . Breast cancer ([place])    hx of right lumpectomy, 1990 IIA radiation cmfp ajunct rx  . Cataract    removed from both eyes  . Chronic airway obstruction, not elsewhere classified   . Coronary artery calcification seen on CAT scan 01/23/2018  . Diverticulosis    only has like 2  . Dysplasia    hx treated with cerv conization  . Fatty liver    US done Oct 07, 2008  . Fibromyalgia   . Hx of nonmelanoma skin cancer     followed dr [place]   . Hyperlipidemia   . Hypothyroidism   . Malignant neoplasm of breast (female), unspecified site 11/28/2013  . Osteoarthritis   . Osteopenia   . REM sleep behavior disorder   . Skin cancer    squamous cell    Family History  Problem Relation Age of Onset  . Other Father        mysthenia gravis  . Lung cancer Sister   . Osteoporosis Other   . Thyroid disease Other   . Breast cancer Mother 76       possible uterine cancer diagnosis at age 79  . Osteoporosis Mother        no hip fracture  . Breast cancer Sister 5  . Lung cancer Sister 82       former smoker  . Lung cancer Paternal Grandmother   . Colon cancer Neg Hx   . Esophageal cancer Neg Hx   . Stomach cancer Neg Hx   . Rectal cancer Neg Hx     Social History   Socioeconomic History  .  Marital status: Widowed    Spouse name: Not on file  . Number of children: 1  . Years of education: Not on file  . Highest education level: Not on file  Occupational History  . Occupation: Retired Pharmacist, hospital  Tobacco Use  . Smoking status: Former Smoker    Packs/day: 0.80    Years: 10.00    Pack years: 8.00    Types: Cigarettes    Quit date: 07/11/1974    Years since quitting: 45.7  . Smokeless tobacco: Never Used  Vaping Use  . Vaping Use: Never used  Substance and Sexual Activity  . Alcohol use: Yes    Alcohol/week: 7.0 standard drinks    Types: 7 Standard drinks or equivalent per week    Comment: 1 glass of red wine nightly and socially  . Drug use: No  . Sexual activity: Yes    Partners: Male  Other Topics Concern  . Not on file  Social History Narrative   Retired Pharmacist, hospital    hh of 1   Now has puppy    Quit smoking 30 + years ago. 20 year pack year smoking hx   Regular exercise- some  treadmill    8 hours sleep         Caffeine use: 1-2 cups of coffee/[employer] instant   Husband dx glioblastoma fall 15   Passed away fall Oct 08, 2014   Social Determinants of Health   Financial Resource Strain:   . Difficulty of Paying Living Expenses: Not on file  Food Insecurity:   . Worried About Charity fundraiser in the Last Year: Not on file  . Ran Out of Food in the Last Year: Not on file  Transportation Needs:   . Lack of Transportation (Medical): Not on file  . Lack of Transportation (Non-Medical):  Not on file  Physical Activity:   . Days of Exercise per Week: Not on file  . Minutes of Exercise per Session: Not on file  Stress:   . Feeling of Stress : Not on file  Social Connections:   . Frequency of Communication with Friends and Family: Not on file  . Frequency of Social Gatherings with Friends and Family: Not on file  . Attends Religious Services: Not on file  . Active Member of Clubs or Organizations: Not on file  . Attends Archivist Meetings: Not on file  .  Marital Status: Not on file    Outpatient Encounter Medications as of 03/30/2020  Medication Sig  . acetaminophen (TYLENOL) 500 MG tablet Take 1,000 mg by mouth daily as needed. pain   . albuterol (VENTOLIN HFA) 108 (90 Base) MCG/ACT inhaler Inhale 2 puffs into the lungs every 6 (six) hours as needed for wheezing or shortness of breath.  Marland Kitchen atorvastatin (LIPITOR) 20 MG tablet Take 1 tablet (20 mg total) by mouth daily.  . Cholecalciferol (VITAMIN D3) 50 MCG (2000 UT) TABS Take by mouth daily.  . diphenhydramine-acetaminophen (TYLENOL PM) 25-500 MG TABS Take 1 tablet by mouth at bedtime as needed. sleep  . fluticasone furoate-vilanterol (BREO ELLIPTA) 100-25 MCG/INH AEPB Inhale 1 puff into the lungs daily.  . pantoprazole (PROTONIX) 40 MG tablet Take 1 tablet (40 mg) every morning for 12 weeks.  After that, reduce to 1 tablet (40 mg) every other day for one additional week.  Marland Kitchen PARoxetine (PAXIL) 40 MG tablet Take 1 tablet (40 mg total) by mouth at bedtime.  Marland Kitchen SYNTHROID 88 MCG tablet Take 1 tablet (88 mcg total) by mouth daily.  . valACYclovir (VALTREX) 1000 MG tablet TAKE 2 TABLETS BY MOUTH TWICE DAILY AS DIRECTED (Patient taking differently: Take 1,000 mg by mouth 3 times/day as needed-between meals & bedtime. TAKE 2 TABLETS BY MOUTH TWICE DAILY AS DIRECTED as needed for cold sores)  . vitamin B-12 (CYANOCOBALAMIN) 1000 MCG tablet Take 1,000 mcg by mouth daily.  . [DISCONTINUED] atorvastatin (LIPITOR) 20 MG tablet Take 20 mg by mouth daily.   . [DISCONTINUED] PARoxetine (PAXIL) 40 MG tablet Take 40 mg by mouth at bedtime.   . [DISCONTINUED] SYNTHROID 88 MCG tablet TAKE 1 TABLET (88 MCG TOTAL) BY MOUTH DAILY.  Marland Kitchen Tiotropium Bromide Monohydrate (SPIRIVA RESPIMAT) 1.25 MCG/ACT AERS Inhale 2 puffs into the lungs daily. (Patient not taking: Reported on 12/18/2019)  . [DISCONTINUED] atorvastatin (LIPITOR) 40 MG tablet Take 1 tablet (40 mg total) by mouth daily. (Patient not taking: Reported on 03/30/2020)    Facility-Administered Encounter Medications as of 03/30/2020  Medication  . 0.9 %  sodium chloride infusion  . 0.9 %  sodium chloride infusion    EXAM:  BP 116/68   Pulse 76   Temp 98 F (36.7 C) (Oral)   Ht 5' 8.3" (1.735 m)   Wt 160 lb (72.6 kg)   SpO2 98%   BMI 24.11 kg/m   Body mass index is 24.11 kg/m.  Physical Exam: Vital signs reviewed GEX:BMWU is a well-developed well-nourished alert cooperative   who appears stated age in no acute distress.  HEENT: normocephalic atraumatic , Eyes: PERRL EOM's full, conjunctiva clear, Nares: paten,t no deformity discharge or tenderness., Ears: no deformity EAC's clear TMs with normal landmarks. Mout   Masked NECK: supple without masses, thyromegaly or bruits. CHEST/PULM:  Clear to auscultation and percussion breath sounds equal no wheeze , rales or rhonchi. No chest wall  deformities or tenderness. Breast righ old changes  Left no nodules  CV: PMI is nondisplaced, S1 S2 no gallops, murmurs, rubs. Peripheral pulses are full without delay.No JVD .  ABDOMEN: Bowel sounds normal nontender  No guard or rebound, no hepato splenomegal no CVA tenderness.   Extremtities:  No clubbing cyanosis or edema, no acute joint swelling or redness no focal atrophy NEURO:  Oriented x3, cranial nerves 3-12 appear to be intact, no obvious focal weakness,gait within normal limits no abnormal reflexes or asymmetrical SKIN: No acute rashes normal turgor, color, no bruising or petechiae. PSYCH: Oriented, good eye contact, no obvious depression anxiety, cognition and judgment appear normal. LN: no cervical axillary inguinal adenopathy No noted deficits in memory, attention, and speech.   Lab Results  Component Value Date   WBC 4.7 03/23/2020   HGB 13.6 03/23/2020   HCT 42.4 03/23/2020   PLT 166 03/23/2020   GLUCOSE 91 03/23/2020   CHOL 147 03/23/2020   TRIG 60 03/23/2020   HDL 57 03/23/2020   LDLDIRECT 137.0 09/11/2012   LDLCALC 76 03/23/2020   ALT 17  03/23/2020   AST 17 03/23/2020   NA 140 03/23/2020   K 3.8 03/23/2020   CL 104 03/23/2020   CREATININE 0.89 03/23/2020   BUN 19 03/23/2020   CO2 28 03/23/2020   TSH 0.63 03/23/2020   HGBA1C 5.7 03/12/2019   ASSESSMENT AND PLAN:  Discussed the following assessment and plan:  Visit for preventive health examination  Hypothyroidism, unspecified type  Pure hypercholesterolemia  Medication management  Need for influenza vaccination - Plan: Flu Vaccine QUAD High Dose(Fluad)  History of breast cancer  Coronary artery calcification seen on CAT scan  Chronic obstructive pulmonary disease, unspecified COPD type ([place]) Disc having  covid vaccine tb status  Pertussis  Etc for taking on  Refugee  But check cdc  Sites advised  Can do dexa next year  Patient Care Team: Burnis Medin, MD as PCP - Adelene Idler, MD [place], Amy, MD as Consulting Physician (Dermatology) Neldon Mc, Donnamarie Poag, MD as Consulting Physician (Allergy and Immunology) Marygrace Drought, MD as Consulting Physician (Ophthalmology) Clent Jacks, MD as Consulting Physician (Ophthalmology) Neldon Mc, Donnamarie Poag, MD as Consulting Physician (Allergy and Immunology) Brand Males, MD as Consulting Physician (Pulmonary Disease) Thornton Park, MD as Consulting Physician (Gastroenterology)  Patient Instructions  Jim Desanctis  You are doing well.  Flu vaccine today  Advise booster  covid vaccine.  Will refill thyroid paxil and  Lipid medication.    Health Maintenance, Female Adopting a healthy lifestyle and getting preventive care are important in promoting health and wellness. Ask your health care provider about:  The right schedule for you to have regular tests and exams.  Things you can do on your own to prevent diseases and keep yourself healthy. What should I know about diet, weight, and exercise? Eat a healthy diet   Eat a diet that includes plenty of vegetables, fruits, low-fat dairy products, and lean  protein.  Do not eat a lot of foods that are high in solid fats, added sugars, or sodium. Maintain a healthy weight Body mass index (BMI) is used to identify weight problems. It estimates body fat based on height and weight. Your health care provider can help determine your BMI and help you achieve or maintain a healthy weight. Get regular exercise Get regular exercise. This is one of the most important things you can do for your health. Most adults should:  Exercise for at least 150  minutes each week. The exercise should increase your heart rate and make you sweat (moderate-intensity exercise).  Do strengthening exercises at least twice a week. This is in addition to the moderate-intensity exercise.  Spend less time sitting. Even light physical activity can be beneficial. Watch cholesterol and blood lipids Have your blood tested for lipids and cholesterol at 75 years of age, then have this test every 5 years. Have your cholesterol levels checked more often if:  Your lipid or cholesterol levels are high.  You are older than 75 years of age.  You are at high risk for heart disease. What should I know about cancer screening? Depending on your health history and family history, you may need to have cancer screening at various ages. This may include screening for:  Breast cancer.  Cervical cancer.  Colorectal cancer.  Skin cancer.  Lung cancer. What should I know about heart disease, diabetes, and high blood pressure? Blood pressure and heart disease  High blood pressure causes heart disease and increases the risk of stroke. This is more likely to develop in people who have high blood pressure readings, are of African descent, or are overweight.  Have your blood pressure checked: ? Every 3-5 years if you are 20-49 years of age. ? Every year if you are 90 years old or older. Diabetes Have regular diabetes screenings. This checks your fasting blood sugar level. Have the screening  done:  Once every three years after age 14 if you are at a normal weight and have a low risk for diabetes.  More often and at a younger age if you are overweight or have a high risk for diabetes. What should I know about preventing infection? Hepatitis B If you have a higher risk for hepatitis B, you should be screened for this virus. Talk with your health care provider to find out if you are at risk for hepatitis B infection. Hepatitis C Testing is recommended for:  Everyone born from [birth year] through 1966.  Anyone with known risk factors for hepatitis C. Sexually transmitted infections (STIs)  Get screened for STIs, including gonorrhea and chlamydia, if: ? You are sexually active and are younger than 75 years of age. ? You are older than 75 years of age and your health care provider tells you that you are at risk for this type of infection. ? Your sexual activity has changed since you were last screened, and you are at increased risk for chlamydia or gonorrhea. Ask your health care provider if you are at risk.  Ask your health care provider about whether you are at high risk for HIV. Your health care provider may recommend a prescription medicine to help prevent HIV infection. If you choose to take medicine to prevent HIV, you should first get tested for HIV. You should then be tested every 3 months for as long as you are taking the medicine. Pregnancy  If you are about to stop having your period (premenopausal) and you may become pregnant, seek counseling before you get pregnant.  Take 400 to 800 micrograms (mcg) of folic acid every day if you become pregnant.  Ask for birth control (contraception) if you want to prevent pregnancy. Osteoporosis and menopause Osteoporosis is a disease in which the bones lose minerals and strength with aging. This can result in bone fractures. If you are 28 years old or older, or if you are at risk for osteoporosis and fractures, ask your health care  provider if you should:  Be  screened for bone loss.  Take a calcium or vitamin D supplement to lower your risk of fractures.  Be given hormone replacement therapy (HRT) to treat symptoms of menopause. Follow these instructions at home: Lifestyle  Do not use any products that contain nicotine or tobacco, such as cigarettes, e-cigarettes, and chewing tobacco. If you need help quitting, ask your health care provider.  Do not use street drugs.  Do not share needles.  Ask your health care provider for help if you need support or information about quitting drugs. Alcohol use  Do not drink alcohol if: ? Your health care provider tells you not to drink. ? You are pregnant, may be pregnant, or are planning to become pregnant.  If you drink alcohol: ? Limit how much you use to 0-1 drink a day. ? Limit intake if you are breastfeeding.  Be aware of how much alcohol is in your drink. In the [place] one drink equals one 12 oz bottle of beer (355 mL), one 5 oz glass of wine (148 mL), or one 1 oz glass of hard liquor (44 mL). General instructions  Schedule regular health, dental, and eye exams.  Stay current with your vaccines.  Tell your health care provider if: ? You often feel depressed. ? You have ever been abused or do not feel safe at home. Summary  Adopting a healthy lifestyle and getting preventive care are important in promoting health and wellness.  Follow your health care provider's instructions about healthy diet, exercising, and getting tested or screened for diseases.  Follow your health care provider's instructions on monitoring your cholesterol and blood pressure. This information is not intended to replace advice given to you by your health care provider. Make sure you discuss any questions you have with your health care provider. Document Revised: 06/20/2018 Document Reviewed: 06/20/2018 Elsevier Patient Education  2020 [place] Ayssa Bentivegna  M.D.

## 2020-03-30 ENCOUNTER — Other Ambulatory Visit: Payer: Self-pay

## 2020-03-30 ENCOUNTER — Encounter: Payer: Self-pay | Admitting: Internal Medicine

## 2020-03-30 ENCOUNTER — Ambulatory Visit (INDEPENDENT_AMBULATORY_CARE_PROVIDER_SITE_OTHER): Payer: Medicare PPO | Admitting: Internal Medicine

## 2020-03-30 VITALS — BP 116/68 | HR 76 | Temp 98.0°F | Ht 68.3 in | Wt 160.0 lb

## 2020-03-30 DIAGNOSIS — J449 Chronic obstructive pulmonary disease, unspecified: Secondary | ICD-10-CM | POA: Diagnosis not present

## 2020-03-30 DIAGNOSIS — Z853 Personal history of malignant neoplasm of breast: Secondary | ICD-10-CM

## 2020-03-30 DIAGNOSIS — E039 Hypothyroidism, unspecified: Secondary | ICD-10-CM | POA: Diagnosis not present

## 2020-03-30 DIAGNOSIS — I2510 Atherosclerotic heart disease of native coronary artery without angina pectoris: Secondary | ICD-10-CM

## 2020-03-30 DIAGNOSIS — Z23 Encounter for immunization: Secondary | ICD-10-CM

## 2020-03-30 DIAGNOSIS — E7800 Pure hypercholesterolemia, unspecified: Secondary | ICD-10-CM | POA: Diagnosis not present

## 2020-03-30 DIAGNOSIS — Z79899 Other long term (current) drug therapy: Secondary | ICD-10-CM | POA: Diagnosis not present

## 2020-03-30 DIAGNOSIS — Z0000 Encounter for general adult medical examination without abnormal findings: Secondary | ICD-10-CM | POA: Diagnosis not present

## 2020-03-30 MED ORDER — SYNTHROID 88 MCG PO TABS
88.0000 ug | ORAL_TABLET | Freq: Every day | ORAL | 3 refills | Status: DC
Start: 2020-03-30 — End: 2021-01-27

## 2020-03-30 MED ORDER — ATORVASTATIN CALCIUM 20 MG PO TABS: 20.0000 mg | ORAL_TABLET | Freq: Every day | ORAL | 3 refills | Status: DC

## 2020-03-30 MED ORDER — PAROXETINE HCL 40 MG PO TABS: 40.0000 mg | ORAL_TABLET | Freq: Every evening | ORAL | 3 refills | Status: DC

## 2020-03-30 NOTE — Progress Notes (Signed)
Nl results   disc at visit today

## 2020-03-30 NOTE — Patient Instructions (Addendum)
Glad  You are doing well.  Flu vaccine today  Advise booster  covid vaccine.  Will refill thyroid paxil and  Lipid medication.    Health Maintenance, Female Adopting a healthy lifestyle and getting preventive care are important in promoting health and wellness. Ask your health care provider about:  The right schedule for you to have regular tests and exams.  Things you can do on your own to prevent diseases and keep yourself healthy. What should I know about diet, weight, and exercise? Eat a healthy diet   Eat a diet that includes plenty of vegetables, fruits, low-fat dairy products, and lean protein.  Do not eat a lot of foods that are high in solid fats, added sugars, or sodium. Maintain a healthy weight Body mass index (BMI) is used to identify weight problems. It estimates body fat based on height and weight. Your health care provider can help determine your BMI and help you achieve or maintain a healthy weight. Get regular exercise Get regular exercise. This is one of the most important things you can do for your health. Most adults should:  Exercise for at least 150 minutes each week. The exercise should increase your heart rate and make you sweat (moderate-intensity exercise).  Do strengthening exercises at least twice a week. This is in addition to the moderate-intensity exercise.  Spend less time sitting. Even light physical activity can be beneficial. Watch cholesterol and blood lipids Have your blood tested for lipids and cholesterol at 75 years of age, then have this test every 5 years. Have your cholesterol levels checked more often if:  Your lipid or cholesterol levels are high.  You are older than 75 years of age.  You are at high risk for heart disease. What should I know about cancer screening? Depending on your health history and family history, you may need to have cancer screening at various ages. This may include screening for:  Breast cancer.  Cervical  cancer.  Colorectal cancer.  Skin cancer.  Lung cancer. What should I know about heart disease, diabetes, and high blood pressure? Blood pressure and heart disease  High blood pressure causes heart disease and increases the risk of stroke. This is more likely to develop in people who have high blood pressure readings, are of African descent, or are overweight.  Have your blood pressure checked: ? Every 3-5 years if you are 69-78 years of age. ? Every year if you are 26 years old or older. Diabetes Have regular diabetes screenings. This checks your fasting blood sugar level. Have the screening done:  Once every three years after age 29 if you are at a normal weight and have a low risk for diabetes.  More often and at a younger age if you are overweight or have a high risk for diabetes. What should I know about preventing infection? Hepatitis B If you have a higher risk for hepatitis B, you should be screened for this virus. Talk with your health care provider to find out if you are at risk for hepatitis B infection. Hepatitis C Testing is recommended for:  Everyone born from [birth year] through 1965.  Anyone with known risk factors for hepatitis C. Sexually transmitted infections (STIs)  Get screened for STIs, including gonorrhea and chlamydia, if: ? You are sexually active and are younger than 75 years of age. ? You are older than 75 years of age and your health care provider tells you that you are at risk for this type of  infection. ? Your sexual activity has changed since you were last screened, and you are at increased risk for chlamydia or gonorrhea. Ask your health care provider if you are at risk.  Ask your health care provider about whether you are at high risk for HIV. Your health care provider may recommend a prescription medicine to help prevent HIV infection. If you choose to take medicine to prevent HIV, you should first get tested for HIV. You should then be tested every 3  months for as long as you are taking the medicine. Pregnancy  If you are about to stop having your period (premenopausal) and you may become pregnant, seek counseling before you get pregnant.  Take 400 to 800 micrograms (mcg) of folic acid every day if you become pregnant.  Ask for birth control (contraception) if you want to prevent pregnancy. Osteoporosis and menopause Osteoporosis is a disease in which the bones lose minerals and strength with aging. This can result in bone fractures. If you are 5 years old or older, or if you are at risk for osteoporosis and fractures, ask your health care provider if you should:  Be screened for bone loss.  Take a calcium or vitamin D supplement to lower your risk of fractures.  Be given hormone replacement therapy (HRT) to treat symptoms of menopause. Follow these instructions at home: Lifestyle  Do not use any products that contain nicotine or tobacco, such as cigarettes, e-cigarettes, and chewing tobacco. If you need help quitting, ask your health care provider.  Do not use street drugs.  Do not share needles.  Ask your health care provider for help if you need support or information about quitting drugs. Alcohol use  Do not drink alcohol if: ? Your health care provider tells you not to drink. ? You are pregnant, may be pregnant, or are planning to become pregnant.  If you drink alcohol: ? Limit how much you use to 0-1 drink a day. ? Limit intake if you are breastfeeding.  Be aware of how much alcohol is in your drink. In the [place] one drink equals one 12 oz bottle of beer (355 mL), one 5 oz glass of wine (148 mL), or one 1 oz glass of hard liquor (44 mL). General instructions  Schedule regular health, dental, and eye exams.  Stay current with your vaccines.  Tell your health care provider if: ? You often feel depressed. ? You have ever been abused or do not feel safe at home. Summary  Adopting a healthy lifestyle and getting  preventive care are important in promoting health and wellness.  Follow your health care provider's instructions about healthy diet, exercising, and getting tested or screened for diseases.  Follow your health care provider's instructions on monitoring your cholesterol and blood pressure. This information is not intended to replace advice given to you by your health care provider. Make sure you discuss any questions you have with your health care provider. Document Revised: 06/20/2018 Document Reviewed: 06/20/2018 Elsevier Patient Education  2020 [employer].

## 2020-04-06 ENCOUNTER — Other Ambulatory Visit: Payer: Self-pay | Admitting: Internal Medicine

## 2020-04-16 ENCOUNTER — Encounter: Payer: Self-pay | Admitting: Internal Medicine

## 2020-04-16 ENCOUNTER — Ambulatory Visit (INDEPENDENT_AMBULATORY_CARE_PROVIDER_SITE_OTHER): Payer: Medicare PPO | Admitting: Internal Medicine

## 2020-04-16 ENCOUNTER — Ambulatory Visit: Payer: Medicare PPO | Admitting: Internal Medicine

## 2020-04-16 ENCOUNTER — Other Ambulatory Visit: Payer: Self-pay

## 2020-04-16 VITALS — BP 110/66 | HR 74 | Ht 69.0 in | Wt 162.0 lb

## 2020-04-16 DIAGNOSIS — R918 Other nonspecific abnormal finding of lung field: Secondary | ICD-10-CM | POA: Diagnosis not present

## 2020-04-16 DIAGNOSIS — J4540 Moderate persistent asthma, uncomplicated: Secondary | ICD-10-CM | POA: Diagnosis not present

## 2020-04-16 LAB — PULMONARY FUNCTION TEST
DL/VA % pred: 137 %
DL/VA: 5.43 ml/min/mmHg/L
DLCO cor % pred: 83 %
DLCO cor: 18.83 ml/min/mmHg
DLCO unc % pred: 84 %
DLCO unc: 18.94 ml/min/mmHg
FEF 25-75 Post: 0.42 L/sec
FEF 25-75 Pre: 0.55 L/sec
FEF2575-%Change-Post: -24 %
FEF2575-%Pred-Post: 21 %
FEF2575-%Pred-Pre: 28 %
FEV1-%Change-Post: -8 %
FEV1-%Pred-Post: 38 %
FEV1-%Pred-Pre: 42 %
FEV1-Post: 1.01 L
FEV1-Pre: 1.1 L
FEV1FVC-%Change-Post: -3 %
FEV1FVC-%Pred-Pre: 72 %
FEV6-%Change-Post: -5 %
FEV6-%Pred-Post: 57 %
FEV6-%Pred-Pre: 60 %
FEV6-Post: 1.9 L
FEV6-Pre: 2.01 L
FEV6FVC-%Change-Post: 0 %
FEV6FVC-%Pred-Post: 103 %
FEV6FVC-%Pred-Pre: 103 %
FVC-%Change-Post: -5 %
FVC-%Pred-Post: 55 %
FVC-%Pred-Pre: 58 %
FVC-Post: 1.92 L
FVC-Pre: 2.02 L
Post FEV1/FVC ratio: 52 %
Post FEV6/FVC ratio: 99 %
Pre FEV1/FVC ratio: 54 %
Pre FEV6/FVC Ratio: 99 %

## 2020-04-16 MED ORDER — SPIRIVA RESPIMAT 2.5 MCG/ACT IN AERS: 2.0000 | INHALATION_SPRAY | Freq: Every day | RESPIRATORY_TRACT | 0 refills | Status: DC

## 2020-04-16 NOTE — Progress Notes (Signed)
PCP Panosh, Standley Brooking, MD   HPI  IOV 08/18/2017  Chief Complaint  Patient presents with  . pulmonary consult    SOB with activity wheezing with exercise and a cough in the morning.    74 year old female whose husband died seen in 06-Oct-2013 for a lung nodule and he subsequently died from glioblastoma multiform A.  She is here as a new consult.  Her issues that she has had over 10 years of shortness of breath with exertion relieved by rest.  Insidious onset.  Slowly getting worse.  She also has cough with early morning wheezing.  The cough is dry but occasionally she has congestion.  Symptoms are indeed progressive.  Several years ago she was on Symbicort or Advair.  She does not remember if this improved her.  Then a few years ago she got changed to [facility] along with Asmanex.  6 months ago the Asmanex was stopped.  Overall these changes have not helped her.  She has learned to live with this. Vacuuming can  cause problems symptoms .  There is no associated chest pain.  She does not have hypertension or diabetes.  She is worried about the risk of lung cancer.  There is because a paternal grandfather and sister died from lung cancer.  Lab review shows that she has eosinophilia that is mild at 300 cells per cubic millimeter.  She has had previous allergy testing that was positive done by Dr. Neldon Mc.  Last CT scan of the chest was in 10-06-04.  In May 2018 she had spirometry at the outside.  I personally visualized the trace.  FEV1 0.9 L / 38% FVC 46% with an obstructed ratio.  Exam nitric oxide today is elevated at 58 ppb. IN terms of smoking history, she smoke 1 ppd x 10 years but quit when she was 27.,> reports allrgy test 10 years ago as positive only for dust mites   OV 09/22/2017  Chief Complaint  Patient presents with  . Follow-up    PFT done, no asthma flare at this time    DESTENY FREEMAN returns for follow-up.  She tells me that after starting Brio her symptoms are significantly improved.   In fact asthma control questionnaire shows a symptom score of 0.5.  She is now waking up at night because of asthma symptoms.  When she wakes up she has no symptoms.  She feels very slightly limited in her activities only when walking uphill.  And she is actually has a very little shortness of breath but again only while walking uphill or climbing stairs.  She has never wheezed and not use albuterol for rescue at all.  Overall is a huge improvement.   Walking desaturation test on 09/22/2017 185 feet x 3 laps on ROOM AIR:  did no desaturate. Rest pulse ox was 100%, final pulse ox was 97%. HR response 69/min at rest to 93/min at peak exertion. Patient WANDALENE ABRAMS  Did not Desaturate < 88% . Felisia S Kohlman yes  Desaturated </= 3% points. Skyanne S Moorehead yes did get tachyardic.  But she did drop pulse ox by 3 points.  In talking to her she tells me that she is got this fixed exertional dyspnea that is relieved by rest.  This may be slowly progressive.  There is no cough or wheezing.  She had pulmonary function test today and this shows significant severe obstruction and on bronchodilator responsiveness flow volume loop abnormalities.  The  DLCO was also reduced at 60%.  She is very surprised that her lung function abnormalities also severe because she does not feel the symptoms as much and she under perceives the symptoms relative to the lung function abnormality.  She is very keen on getting a CT scan of the chest because of family history of lung cancer.   OV 02/16/2018  Chief Complaint  Patient presents with  . Follow-up    5 month follow up to discuss test results.    [name] eturns for follow-up of 2 issues  Moderate persistent asthma with fixed exertional dyspnea: She is tells me that with Brio her symptoms of improvement. Given the fixed exertional dyspnea is better. Overall asthma control questionnaire shows a score of 0.4 showing excellent control. She is not waking up in the middle of  the night with symptoms when she wakes up she has no symptoms. She is very slightly limited in her activities because of asthma and she's experienced very little shortness of breath. She does not have any wheeze or does not use albuterol for rescue  Follow-up multiple lung nodules: the largest in 48mmm RUL nodule and this stable march 2019  -> July 2019    IMPRESSION: CT chest Near complete re solution of ill-defined nodular opacity in medial left lower lobe, consistent with resolving inflammatory or infectious etiology.  Multiple other sub-cm bilateral pulmonary nodules remain stable, largest in right lung apex measuring 9 mm. Recommend continued follow-up by chest CT without contrast in 12 months. This recommendation follows the consensus statement: Guidelines for Management of Small Pulmonary Nodules Detected on CT Images: From the Fleischner Society 2017; Radiology 2017; 284:228-243.   Electronically Signed   By: Earle Gell M.D.   On: 01/23/2018 15:19   OV 03/26/2019  Subjective:  Patient ID: [name], female , DOB: [birth year] , age 76 y.o. , MRN: [id number] , ADDRESS: [place] [id number]   03/26/2019 -   Chief Complaint  Patient presents with  . Moderate persistent asthma without complication    Discuss results of PFT      ICD-10-CM   1. Moderate persistent asthma without complication  P29.51 Tiotropium Bromide Monohydrate (SPIRIVA RESPIMAT) 1.25 MCG/ACT AERS  2. Multiple lung nodules on CT  R91.8 Tiotropium Bromide Monohydrate (SPIRIVA RESPIMAT) 1.25 MCG/ACT AERS     HPI Yevonne S Shippy 73 y.o. -presents for the above problems.  Visit is nearly after 1 year.  In the interim she is stable.  She is up-to-date with her vaccines.  July 2019 because of coronary artery calcifications and ongoing fixed exertional dyspnea she had a cardiac stress test I reviewed the result and this was normal.  She says the Brio is helping her but despite this she  continues to have fixed exertional dyspnea.  Her asthma control questionnaire is stable.  She had repeat lung function today it is stable and unchanged which is good but she still has severe fixed obstructive lung defect.  She has a family history of myasthenia gravis and her dad but she denies any end of the day fatigue or eyelid drooping symptoms.  Moreover the pulmonary function test is not restrictive.  There is no cough we discussed and she is interested in adding inhaler therapy.  She is not interested in second opinion for a fixed obstructive lung disease defect.   For multiple lung nodules: She has 1-1/2-year stability at this point.      CT Chest Lungs/Pleura: Multiple  bilateral pulmonary nodules, as before. Index nodule in the apical segment right upper lobe measures 7 mm (5 x 8 mm), stable. Post radiation scarring in the anterior subpleural right lung. No pleural fluid. Airway is unremarkable.  Upper Abdomen: Visualized portions of the liver, gallbladder, adrenal glands, kidneys, spleen, pancreas, stomach and bowel are unremarkable. No upper abdominal adenopathy.  Musculoskeletal: Degenerative changes in the spine. No worrisome lytic or sclerotic lesions. Question mild radiation changes in the anterolateral right ribs, as before. No associated fracture.  IMPRESSION: Bilateral pulmonary nodules are stable and likely benign.   Electronically Signed   By: Lorin Picket M.D.   On: 01/30/2019 13:17    OV 09/23/2019  Subjective:  Patient ID: Patton Swisher, female , DOB: Aug 15, 1945 , age 64 y.o. , MRN: [id number] , ADDRESS: [place] [id number]   09/23/2019 -   Chief Complaint  Patient presents with  . Follow-up    Pt states she has been doing okay since last visit and denies any recent flare ups with her asthma.     HPI Melitta Tigue 74 y.o. -   Follow-up moderate persistent asthma: She has fixed obstructive disease that does  not respond to inhaler therapy although symptoms improved.  Therefore back in September 2020 I added Spiriva to the mix.  She says she only has still has mild dyspnea with fixed exertional relieved by rest.  It presents for walking an incline or stairs.  Otherwise she feels fine.  She does not think Spiriva has helped.  She was supposed to have pulmonary function test but because of COVID-19 pandemic this has been disrupted.  We do not have the data.  She said that she met anesthetist who said that he was surprised that she did not have a rescue inhaler.  She has never needed 1.  However I did agree with the anesthesiologist recommendation for her to carry an albuterol inhaler.  Therefore I settle make the prescription.  At this point in time she has had a Covid vaccine.  Pulmonary nodules: This is no stable for 2 years.  Results documented below.  I visualized and interpreted the CT scan myself.  There is report of emphysema on the CT scan but I could not appreciate it.  New issue: Fluid level in the esophagus: There is a new finding on the CT scan I personally visualized it.  She does have Dr. Tarri Glenn is a gastroenterologist.  Last seen towards the end of 2020 but before the CT scan.  There is no mention of any esophageal issues on Dr. Tarri Glenn medical record.  However this finding has postdated that visit.  I have asked the patient to take this up with Dr. Tarri Glenn.       IMPRESSION: CT CHEST 1. Ongoing stability of bilateral pulmonary nodules, most consistent favoring a benign etiology. 2. Right axillary node dissection, without findings of thoracic metastatic disease. 3. Aortic atherosclerosis (ICD10-I70.0), coronary artery atherosclerosis and emphysema (ICD10-J43.9). 4. Esophageal air fluid level suggests dysmotility or gastroesophageal reflux.   Electronically Signed   By: Abigail Miyamoto M.D.   On: 06/25/2019 13:32 ROS - per HPI    OV 12/04/2019  Subjective:  Patient ID: Kalman Shan, female , DOB: 02-09-1946 , age 14 y.o. , MRN: [id number] , ADDRESS: [place]  [id number]   12/04/2019 -   Chief Complaint  Patient presents with  . Follow-up    pt is here to go over spiro  results.CT of chestlung nodule. pt states gerd. pt has endoscopy schedule for jun 3rrd   Followup fixed obstrucitve lung disease  HPI Nevelyn Mellott 74 y.o.  - returns for followup. Continue spiriva and breo. No definite improvement in symptoms since spiriva. Overall stable/same. Has dyspnea for stairs and gardening but able to cope. Not a big issues. No flare ups. No prednisone use. No ER visit. She is interested in tapering down her medication regimen but was interested in hearing other therapeutic options. We discussed biologic due to her increased eos as a way to improve symptoms ( would be  A time limited trial) but she is not interested.Marland Kitchen PFT show improvement in fev1 but reduction in dlco - overall on average is stable. No new issues.    In terms of her esophageal issues - she is having EGD in June 2021 by Dr Albin Felling      OV 04/16/2020   Subjective:  Patient ID: Kalman Shan, female , DOB: January 23, 1946, age 64 y.o. years. , MRN: [id number],  ADDRESS: [place] [id number] PCP  Regis Bill, Standley Brooking, MD Providers : Treatment Team:  Attending Provider: Brand Males, MD Panosh, Standley Brooking, MD as PCP - General  Patient Care Team: Burnis Medin, MD as PCP - Adelene Idler, MD Martinique, Amy, MD as Consulting Physician (Dermatology) Neldon Mc, Donnamarie Poag, MD as Consulting Physician (Allergy and Immunology) Marygrace Drought, MD as Consulting Physician (Ophthalmology) Clent Jacks, MD as Consulting Physician (Ophthalmology) Neldon Mc, Donnamarie Poag, MD as Consulting Physician (Allergy and Immunology) Brand Males, MD as Consulting Physician (Pulmonary Disease) Thornton Park, MD as Consulting Physician (Gastroenterology)   Chief  Complaint  Patient presents with  . Follow-up    PFT performed today.  Pt states her breathing has been worse since last visit. Denies any complaints of wheezing, cough, or chest tightness.    Follow-up fixed obstructive lung disease   HPI Briah Nary 74 y.o. -last seen in May 2021.  After that she had endoscopy.  I could not visualize the results but I tried to located.  She did have it.  She tells me it was normal but she is on a short course of PPI.  At last visit we stopped her Spiriva as an effort to reduce her overall medication burden.  She says after that she started having more shortness of breath with exertion relieved by rest.  It is for activities.  She says that otherwise she is at baseline.  Ideally she would like to do gardening but even before stopping the Spiriva this was not possible because of shortness of breath.  She says she has resigned to the fact that she is 46 and therefore cannot do those.  This time because of the worsening shortness of breath she seems more open to having Biologics but at this same time as a first step she would like to go back on Spiriva.  She feels she really needs the Spiriva along with the [facility].  Her lung function tests are shown below and it shows decline.  She has had the flu shot but plans to have the Covid booster soon.    Asthma Control Panel eos 300 always - last 2018, 100 in march 2019 and 400 in aug 2017. IgE 8 - march 2019 12/06/2016 08/18/2017  09/22/2017  02/16/2018  03/26/2019  09/23/2019  04/16/2020   Current Med Regimen  anoro breo breo breo breo and spiriva   ACT 22  20  ACQ 5 point- 1 week. wtd avg score. <1.0 is good control 0.75-1.25 is grey zone. >1.25 poor control. Delta 0.5 is clinically meaningful  12/5 = 2.4 0.5 0.4 0.4 0.4   FeNO ppB  58  25     FeV1   0.96L/38% in May 2018 outside 1.11/425% -> 0.79/30% ratop 65 -> 39, DLCO 18.33/61%  1.14L/43% and ratio 52    Planned intervention  for visit  Change anoro to  breo low dose breo + get HRCT + blood allergy panel -. Did only IgE - normal 8        Asthma Control Test ACT Total Score  04/16/2020 20  12/06/2016 22     Lab Results  Component Value Date   NITRICOXIDE 58 08/18/2017     PFT REPORT  Results for SANDRALEE, TARKINGTON (MRN [id number]) as of 12/04/2019 11:53  Ref. Range 09/22/2017 09:55 03/26/2019 08:48 11/29/2019 13:37  FEV1-Pre Latest Units: L 1.11 1.14 1.26  FEV1-%Pred-Pre Latest Units: % 42 43 48  Pre FEV1/FVC ratio Latest Units: % 49 52 55   Results for INETTA, DICKE (MRN [id number]) as of 12/04/2019 11:53  Ref. Range 09/22/2017 09:55 03/26/2019 08:48 11/29/2019 13:37  DLCO unc Latest Units: ml/min/mmHg 18.33 17.13 16.30  DLCO unc % pred Latest Units: % 61 75 72    ROS - per HPI  IMPRESSION:  CT chest 1. Ongoing stability of bilateral pulmonary nodules, most consistent favoring a benign etiology. 2. Right axillary node dissection, without findings of thoracic metastatic disease. 3. Aortic atherosclerosis (ICD10-I70.0), coronary artery atherosclerosis and emphysema (ICD10-J43.9). 4. Esophageal air fluid level suggests dysmotility or gastroesophageal reflux.   Electronically Signed   By: Abigail Miyamoto M.D.   On: 06/25/2019 13:32     PFT Results Latest Ref Rng & Units 04/16/2020 11/29/2019 03/26/2019 09/22/2017  FVC-Pre L 2.02 2.29 2.17 2.24  FVC-Predicted Pre % 58 66 62 65  FVC-Post L 1.92 - - 2.05  FVC-Predicted Post % 55 - - 59  Pre FEV1/FVC % % 54 55 52 49  Post FEV1/FCV % % 52 - - 39  FEV1-Pre L 1.10 1.26 1.14 1.11  FEV1-Predicted Pre % 42 48 43 42  FEV1-Post L 1.01 - - 0.79  DLCO uncorrected ml/min/mmHg 18.94 16.30 17.13 18.33  DLCO UNC% % 84 72 75 61  DLCO corrected ml/min/mmHg 18.83 16.30 16.78 -  DLCO COR %Predicted % 83 72 74 -  DLVA Predicted % 137 110 132 96  TLC L - - - 6.80  TLC % Predicted % - - - 119  RV % Predicted % - - - 181   CT chest 06/25/2019  IMPRESSION: 1. Ongoing stability of  bilateral pulmonary nodules, most consistent favoring a benign etiology. 2. Right axillary node dissection, without findings of thoracic metastatic disease. 3. Aortic atherosclerosis (ICD10-I70.0), coronary artery atherosclerosis and emphysema (ICD10-J43.9). 4. Esophageal air fluid level suggests dysmotility or gastroesophageal reflux.   Electronically Signed   By: Abigail Miyamoto M.D.   On: 06/25/2019 13:32  ROS - per HPI     has a past medical history of Anxiety, Asthma, Breast cancer ([place]), Cataract, Chronic airway obstruction, not elsewhere classified, Coronary artery calcification seen on CAT scan (01/23/2018), Diverticulosis, Dysplasia, Fatty liver, Fibromyalgia, nonmelanoma skin cancer, Hyperlipidemia, Hypothyroidism, Malignant neoplasm of breast (female), unspecified site (11/28/2013), Osteoarthritis, Osteopenia, REM sleep behavior disorder, and Skin cancer.   reports that she quit smoking about 45 years ago. Her smoking use included cigarettes. She has a  8.00 pack-year smoking history. She has never used smokeless tobacco.  Past Surgical History:  Procedure Laterality Date  . BREAST LUMPECTOMY     on right, 1990 IIA radiation cmfp ajuction rx  . COLONOSCOPY    . KNEE SURGERY  2012  . NASAL SINUS SURGERY  2007   on right done by Dr. Annalee Genta    Allergies  Allergen Reactions  . Hydrocodone Itching    Immunization History  Administered Date(s) Administered  . Fluad Quad(high Dose 65+) 03/12/2019, 03/30/2020  . Hepatitis A 08/30/2017  . Influenza Whole 04/10/2002, 06/01/2009, 05/05/2010, 05/22/2012, 02/20/2017  . Influenza, High Dose Seasonal PF 04/22/2016  . Influenza,inj,Quad PF,6+ Mos 06/04/2013  . Influenza-Unspecified 05/23/2011  . PFIZER SARS-COV-2 Vaccination 08/03/2019, 08/24/2019  . Pneumococcal Conjugate-13 10/09/2013  . Pneumococcal Polysaccharide-23 05/05/2010  . Td 08/11/2006, 02/27/2017  . Typhoid Inactivated 09/07/2017  . Zoster 09/11/2012  .  Zoster Recombinat (Shingrix) 06/06/2018, 03/14/2019    Family History  Problem Relation Age of Onset  . Other Father        mysthenia gravis  . Lung cancer Sister   . Osteoporosis Other   . Thyroid disease Other   . Breast cancer Mother 85       possible uterine cancer diagnosis at age 28  . Osteoporosis Mother        no hip fracture  . Breast cancer Sister 88  . Lung cancer Sister 16       former smoker  . Lung cancer Paternal Grandmother   . Colon cancer Neg Hx   . Esophageal cancer Neg Hx   . Stomach cancer Neg Hx   . Rectal cancer Neg Hx      Current Outpatient Medications:  .  acetaminophen (TYLENOL) 500 MG tablet, Take 1,000 mg by mouth daily as needed. pain , Disp: , Rfl:  .  albuterol (VENTOLIN HFA) 108 (90 Base) MCG/ACT inhaler, Inhale 2 puffs into the lungs every 6 (six) hours as needed for wheezing or shortness of breath., Disp: 8 g, Rfl: 5 .  atorvastatin (LIPITOR) 20 MG tablet, Take 1 tablet (20 mg total) by mouth daily., Disp: 90 tablet, Rfl: 3 .  Cholecalciferol (VITAMIN D3) 50 MCG (2000 UT) TABS, Take by mouth daily., Disp: , Rfl:  .  diphenhydramine-acetaminophen (TYLENOL PM) 25-500 MG TABS, Take 1 tablet by mouth at bedtime as needed. sleep, Disp: , Rfl:  .  fluticasone furoate-vilanterol (BREO ELLIPTA) 100-25 MCG/INH AEPB, Inhale 1 puff into the lungs daily., Disp: 180 each, Rfl: 3 .  pantoprazole (PROTONIX) 40 MG tablet, Take 1 tablet (40 mg) every morning for 12 weeks.  After that, reduce to 1 tablet (40 mg) every other day for one additional week., Disp: 100 tablet, Rfl: 0 .  PARoxetine (PAXIL) 40 MG tablet, Take 1 tablet (40 mg total) by mouth at bedtime., Disp: 90 tablet, Rfl: 3 .  SYNTHROID 88 MCG tablet, Take 1 tablet (88 mcg total) by mouth daily., Disp: 90 tablet, Rfl: 3 .  valACYclovir (VALTREX) 1000 MG tablet, TAKE 2 TABLETS BY MOUTH TWICE DAILY AS DIRECTED (Patient taking differently: Take 1,000 mg by mouth 3 times/day as needed-between meals &  bedtime. TAKE 2 TABLETS BY MOUTH TWICE DAILY AS DIRECTED as needed for cold sores), Disp: 90 tablet, Rfl: prn .  vitamin B-12 (CYANOCOBALAMIN) 1000 MCG tablet, Take 1,000 mcg by mouth daily., Disp: , Rfl:  .  Tiotropium Bromide Monohydrate (SPIRIVA RESPIMAT) 2.5 MCG/ACT AERS, Inhale 2 puffs into the lungs daily., Disp: 4  g, Rfl: 0  Current Facility-Administered Medications:  .  0.9 %  sodium chloride infusion, 500 mL, Intravenous, Once, Thornton Park, MD .  0.9 %  sodium chloride infusion, 500 mL, Intravenous, Once, Thornton Park, MD      Objective:   Vitals:   04/16/20 1510  BP: 110/66  Pulse: 74  SpO2: 98%  Weight: 162 lb (73.5 kg)  Height: $Remove'5\' 9"'BNwVjDY$  (1.753 m)    Estimated body mass index is 23.92 kg/m as calculated from the following:   Height as of this encounter: $RemoveBeforeD'5\' 9"'amYGyhJUxmxbOq$  (1.753 m).   Weight as of this encounter: 162 lb (73.5 kg).  $Rem'@WEIGHTCHANGE'ySvo$ @  [employer]   04/16/20 1510  Weight: 162 lb (73.5 kg)     Physical Exam Gen: looks well Neuro: Alert and Oriented x 3. GCS 15. Speech normal Psych: Pleasant Resp:  Barrel Chest - no.  Wheeze - no, Crackles - no, No overt respiratory distress CVS: Normal heart sounds. Murmurs - no Ext: Stigmata of Connective Tissue Disease - no HEENT: Normal upper airway. PEERL +. No post nasal drip        Assessment:       ICD-10-CM   1. Moderate persistent asthma without complication  A12.87   2. Multiple lung nodules on CT  R91.8        Plan:     Patient Instructions  Moderate persistent asthma without complication  - stable and well controlled - breathing test  with fixed lung obstruction but now shows decline. Possibly related to lack of spiriva but need to ensure no other reasons  Plan  --Continue  albuterol as needed - you do need rescue inhaler - continue breo as before  -restart  Spiriva 2 puff daily - do cbc with diff and blood IgE - do HRCT supine and prone in dec 2021  -  do spirometry and BD response and  dlco in dec 2021 - do feno test in dec 2021  - Based on above at follow-up then will have to discuss add biologic  Multiple lung nodules on CT  - largest in 59mmm Right upper lobe -stable march -> july 2019 ->July 2020 -> dec 2020  Plan - next CT dec 2021   Fluid level in esophagus - seeon CT dec 2020   - per your report endoscopy by Dr Tarri Glenn  2021 without adversity  Plan   - PPI per GI  Followup Dec 2021 with Dr Chase Caller (first 2 weeks in Dec)     SIGNATURE    Dr. Brand Males, M.D., F.C.C.P,  Pulmonary and Critical Care Medicine Staff Physician, [place] Director - Interstitial Lung Disease  Program  Pulmonary [place] [facility] [place], [place], [address]  Pager: [phone], If no answer or between  15:00h - 7:00h: call [phone] Telephone: [phone]  3:47 PM 04/16/2020

## 2020-04-16 NOTE — Patient Instructions (Addendum)
Moderate persistent asthma without complication  - stable and well controlled - breathing test  with fixed lung obstruction but now shows decline. Possibly related to lack of spiriva but need to ensure no other reasons  Plan  --Continue  albuterol as needed - you do need rescue inhaler - continue breo as before  -restart  Spiriva 2 puff daily - do cbc with diff and blood IgE - do HRCT supine and prone in dec 2021  -  do spirometry and BD response and dlco in dec 2021 - do feno test in dec 2021  - Based on above at follow-up then will have to discuss add biologic  Multiple lung nodules on CT  - largest in 39mmm Right upper lobe -stable march -> july 2019 ->July 2020 -> dec 2020  Plan - next CT dec 2021   Fluid level in esophagus - seeon CT dec 2020   - per your report endoscopy by Dr Tarri Glenn  2021 without adversity  Plan   - PPI per GI  Followup Dec 2021 with Dr Chase Caller (first 2 weeks in Dec)

## 2020-04-16 NOTE — Progress Notes (Signed)
Spirometry pre and post and DLCO performed today.

## 2020-04-17 LAB — CBC WITH DIFFERENTIAL/PLATELET
Basophils Absolute: 0.1 10*3/uL (ref 0.0–0.1)
Basophils Relative: 0.9 % (ref 0.0–3.0)
Eosinophils Absolute: 0.2 10*3/uL (ref 0.0–0.7)
Eosinophils Relative: 2.7 % (ref 0.0–5.0)
HCT: 40.3 % (ref 36.0–46.0)
Hemoglobin: 13.2 g/dL (ref 12.0–15.0)
Lymphocytes Relative: 25.8 % (ref 12.0–46.0)
Lymphs Abs: 1.7 10*3/uL (ref 0.7–4.0)
MCHC: 32.8 g/dL (ref 30.0–36.0)
MCV: 94.4 fl (ref 78.0–100.0)
Monocytes Absolute: 0.4 10*3/uL (ref 0.1–1.0)
Monocytes Relative: 6.6 % (ref 3.0–12.0)
Neutro Abs: 4.2 10*3/uL (ref 1.4–7.7)
Neutrophils Relative %: 64 % (ref 43.0–77.0)
Platelets: 163 10*3/uL (ref 150.0–400.0)
RBC: 4.27 Mil/uL (ref 3.87–5.11)
RDW: 13.1 % (ref 11.5–15.5)
WBC: 6.5 10*3/uL (ref 4.0–10.5)

## 2020-04-17 LAB — IGE: IgE (Immunoglobulin E), Serum: 13 kU/L (ref <=114)

## 2020-04-18 NOTE — Progress Notes (Signed)
Blood work normal. Will discuss at followup. Will not call with results

## 2020-05-18 ENCOUNTER — Telehealth: Payer: Self-pay | Admitting: Internal Medicine

## 2020-05-18 MED ORDER — SPIRIVA RESPIMAT 2.5 MCG/ACT IN AERS: 2.0000 | INHALATION_SPRAY | Freq: Every day | RESPIRATORY_TRACT | 1 refills | Status: DC

## 2020-05-18 NOTE — Telephone Encounter (Signed)
90 day supply of spiriva 2.5 has been sent to preferred pharmacy.  Patient is aware and voiced her understanding.  Nothing further needed.

## 2020-06-02 ENCOUNTER — Other Ambulatory Visit: Payer: Self-pay | Admitting: Internal Medicine

## 2020-06-02 DIAGNOSIS — Z1231 Encounter for screening mammogram for malignant neoplasm of breast: Secondary | ICD-10-CM

## 2020-06-11 ENCOUNTER — Other Ambulatory Visit: Payer: Self-pay

## 2020-06-11 ENCOUNTER — Ambulatory Visit (INDEPENDENT_AMBULATORY_CARE_PROVIDER_SITE_OTHER)
Admission: RE | Admit: 2020-06-11 | Discharge: 2020-06-11 | Disposition: A | Discharge status: Home / Self Care | Payer: Medicare PPO | Source: Ambulatory Visit | Attending: Internal Medicine | Admitting: Internal Medicine

## 2020-06-11 DIAGNOSIS — J4540 Moderate persistent asthma, uncomplicated: Secondary | ICD-10-CM

## 2020-06-11 DIAGNOSIS — R918 Other nonspecific abnormal finding of lung field: Secondary | ICD-10-CM | POA: Diagnosis not present

## 2020-06-12 NOTE — Progress Notes (Signed)
IMPRESSION: 1. Stable scattered mild cylindrical bronchiectasis throughout both lungs, most prominent in the right middle and right upper lobes. No evidence of interstitial lung disease. 2. 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 3. Numerous subcentimeter pulmonary nodules scattered throughout both lungs, all stable since at least 2019 CT, presumably benign. No thoracic adenopathy or other findings to suggest metastatic disease. 4. One vessel coronary atherosclerosis.   Electronically Signed   By: Ilona Sorrel M.D.   On: 06/11/2020 14:54  Xxxx Overall stalbe Will discuss at followup 06/18/20

## 2020-06-15 ENCOUNTER — Telehealth: Payer: Self-pay | Admitting: Internal Medicine

## 2020-06-15 NOTE — Telephone Encounter (Signed)
Left message for patient to call back and schedule Medicare Annual Wellness Visit (AWV) either virtually or in office.   Last AWV 12/13/17 please schedule at anytime with [facility] Nurse Health Advisor 1 or 2   This should be a 45 minute visit.

## 2020-06-18 ENCOUNTER — Ambulatory Visit (INDEPENDENT_AMBULATORY_CARE_PROVIDER_SITE_OTHER): Payer: Medicare PPO | Admitting: Internal Medicine

## 2020-06-18 ENCOUNTER — Other Ambulatory Visit: Payer: Self-pay

## 2020-06-18 ENCOUNTER — Ambulatory Visit: Payer: Medicare PPO | Admitting: Internal Medicine

## 2020-06-18 ENCOUNTER — Encounter: Payer: Self-pay | Admitting: Internal Medicine

## 2020-06-18 VITALS — BP 112/70 | HR 77 | Temp 97.2°F | Ht 69.0 in | Wt 163.0 lb

## 2020-06-18 DIAGNOSIS — R0602 Shortness of breath: Secondary | ICD-10-CM

## 2020-06-18 DIAGNOSIS — J4540 Moderate persistent asthma, uncomplicated: Secondary | ICD-10-CM

## 2020-06-18 DIAGNOSIS — R918 Other nonspecific abnormal finding of lung field: Secondary | ICD-10-CM | POA: Diagnosis not present

## 2020-06-18 LAB — PULMONARY FUNCTION TEST
DL/VA % pred: 134 %
DL/VA: 5.33 ml/min/mmHg/L
DLCO cor % pred: 82 %
DLCO cor: 18.6 ml/min/mmHg
DLCO unc % pred: 82 %
DLCO unc: 18.6 ml/min/mmHg
FEF 25-75 Post: 0.41 L/sec
FEF 25-75 Pre: 0.58 L/sec
FEF2575-%Change-Post: -29 %
FEF2575-%Pred-Post: 20 %
FEF2575-%Pred-Pre: 29 %
FEV1-%Change-Post: -15 %
FEV1-%Pred-Post: 37 %
FEV1-%Pred-Pre: 44 %
FEV1-Post: 0.98 L
FEV1-Pre: 1.16 L
FEV1FVC-%Change-Post: -1 %
FEV1FVC-%Pred-Pre: 75 %
FEV6-%Change-Post: -12 %
FEV6-%Pred-Post: 53 %
FEV6-%Pred-Pre: 61 %
FEV6-Post: 1.76 L
FEV6-Pre: 2.02 L
FEV6FVC-%Pred-Post: 104 %
FEV6FVC-%Pred-Pre: 104 %
FVC-%Change-Post: -14 %
FVC-%Pred-Post: 51 %
FVC-%Pred-Pre: 59 %
FVC-Post: 1.76 L
FVC-Pre: 2.05 L
Post FEV1/FVC ratio: 55 %
Post FEV6/FVC ratio: 100 %
Pre FEV1/FVC ratio: 56 %
Pre FEV6/FVC Ratio: 100 %

## 2020-06-18 LAB — POCT EXHALED NITRIC OXIDE: FeNO level (ppb): 16

## 2020-06-18 NOTE — Progress Notes (Signed)
Spirometry pre/post and DLCO performed today. 

## 2020-06-18 NOTE — Addendum Note (Signed)
Addended byCoralie Keens on: 06/18/2020 02:32 PM   Modules accepted: Orders

## 2020-06-18 NOTE — Progress Notes (Signed)
   PCP Panosh, Wanda K, MD   HPI  IOV 08/18/2017  Chief Complaint  Patient presents with  . pulmonary consult    SOB with activity wheezing with exercise and a cough in the morning.    74-year-old female whose husband died seen in 2015 for a lung nodule and he subsequently died from glioblastoma multiform A.  She is here as a new consult.  Her issues that she has had over 10 years of shortness of breath with exertion relieved by rest.  Insidious onset.  Slowly getting worse.  She also has cough with early morning wheezing.  The cough is dry but occasionally she has congestion.  Symptoms are indeed progressive.  Several years ago she was on Symbicort or Advair.  She does not remember if this improved her.  Then a few years ago she got changed to ANORO along with Asmanex.  6 months ago the Asmanex was stopped.  Overall these changes have not helped her.  She has learned to live with this. Vacuuming can  cause problems symptoms .  There is no associated chest pain.  She does not have hypertension or diabetes.  She is worried about the risk of lung cancer.  There is because a paternal grandfather and sister died from lung cancer.  Lab review shows that she has eosinophilia that is mild at 300 cells per cubic millimeter.  She has had previous allergy testing that was positive done by Dr. Kozlow.  Last CT scan of the chest was in 2006.  In May 2018 she had spirometry at the outside.  I personally visualized the trace.  FEV1 0.9 L / 38% FVC 46% with an obstructed ratio.  Exam nitric oxide today is elevated at 58 ppb. IN terms of smoking history, she smoke 1 ppd x 10 years but quit when she was 27.,> reports allrgy test 10 years ago as positive only for dust mites   OV 09/22/2017  Chief Complaint  Patient presents with  . Follow-up    PFT done, no asthma flare at this time    Jamiaya S Stansbury returns for follow-up.  She tells me that after starting Brio her symptoms are significantly improved.  In  fact asthma control questionnaire shows a symptom score of 0.5.  She is now waking up at night because of asthma symptoms.  When she wakes up she has no symptoms.  She feels very slightly limited in her activities only when walking uphill.  And she is actually has a very little shortness of breath but again only while walking uphill or climbing stairs.  She has never wheezed and not use albuterol for rescue at all.  Overall is a huge improvement.   Walking desaturation test on 09/22/2017 185 feet x 3 laps on ROOM AIR:  did no desaturate. Rest pulse ox was 100%, final pulse ox was 97%. HR response 69/min at rest to 93/min at peak exertion. Patient Egan S Asebedo  Did not Desaturate < 88% . Farha S Cdebaca yes  Desaturated </= 3% points. Yukari S Garno yes did get tachyardic.  But she did drop pulse ox by 3 points.  In talking to her she tells me that she is got this fixed exertional dyspnea that is relieved by rest.  This may be slowly progressive.  There is no cough or wheezing.  She had pulmonary function test today and this shows significant severe obstruction and on bronchodilator responsiveness flow volume loop abnormalities.  The DLCO   was also reduced at 60%.  She is very surprised that her lung function abnormalities also severe because she does not feel the symptoms as much and she under perceives the symptoms relative to the lung function abnormality.  She is very keen on getting a CT scan of the chest because of family history of lung cancer.   OV 02/16/2018  Chief Complaint  Patient presents with  . Follow-up    5 month follow up to discuss test results.    Annalei S Tilmon eturns for follow-up of 2 issues  Moderate persistent asthma with fixed exertional dyspnea: She is tells me that with Brio her symptoms of improvement. Given the fixed exertional dyspnea is better. Overall asthma control questionnaire shows a score of 0.4 showing excellent control. She is not waking up in the middle of the  night with symptoms when she wakes up she has no symptoms. She is very slightly limited in her activities because of asthma and she's experienced very little shortness of breath. She does not have any wheeze or does not use albuterol for rescue  Follow-up multiple lung nodules: the largest in 9mmm RUL nodule and this stable march 2019  -> July 2019    IMPRESSION: CT chest Near complete re solution of ill-defined nodular opacity in medial left lower lobe, consistent with resolving inflammatory or infectious etiology.  Multiple other sub-cm bilateral pulmonary nodules remain stable, largest in right lung apex measuring 9 mm. Recommend continued follow-up by chest CT without contrast in 12 months. This recommendation follows the consensus statement: Guidelines for Management of Small Pulmonary Nodules Detected on CT Images: From the Fleischner Society 2017; Radiology 2017; 284:228-243.   Electronically Signed   By: John  Stahl M.D.   On: 01/23/2018 15:19   OV 03/26/2019  Subjective:  Patient ID: Tomara S Keena, female , DOB: 07/24/1945 , age 73 y.o. , MRN: [id number] , ADDRESS: [address] [place] [address]   03/26/2019 -   Chief Complaint  Patient presents with  . Moderate persistent asthma without complication    Discuss results of PFT      ICD-10-CM   1. Moderate persistent asthma without complication  J45.40 Tiotropium Bromide Monohydrate (SPIRIVA RESPIMAT) 1.25 MCG/ACT AERS  2. Multiple lung nodules on CT  R91.8 Tiotropium Bromide Monohydrate (SPIRIVA RESPIMAT) 1.25 MCG/ACT AERS     HPI Hajer S Feely 73 y.o. -presents for the above problems.  Visit is nearly after 1 year.  In the interim she is stable.  She is up-to-date with her vaccines.  July 2019 because of coronary artery calcifications and ongoing fixed exertional dyspnea she had a cardiac stress test I reviewed the result and this was normal.  She says the Brio is helping her but despite this she  continues to have fixed exertional dyspnea.  Her asthma control questionnaire is stable.  She had repeat lung function today it is stable and unchanged which is good but she still has severe fixed obstructive lung defect.  She has a family history of myasthenia gravis and her dad but she denies any end of the day fatigue or eyelid drooping symptoms.  Moreover the pulmonary function test is not restrictive.  There is no cough we discussed and she is interested in adding inhaler therapy.  She is not interested in second opinion for a fixed obstructive lung disease defect.   For multiple lung nodules: She has 1-1/2-year stability at this point.      CT Chest Lungs/Pleura: Multiple bilateral   pulmonary nodules, as before. Index nodule in the apical segment right upper lobe measures 7 mm (5 x 8 mm), stable. Post radiation scarring in the anterior subpleural right lung. No pleural fluid. Airway is unremarkable.  Upper Abdomen: Visualized portions of the liver, gallbladder, adrenal glands, kidneys, spleen, pancreas, stomach and bowel are unremarkable. No upper abdominal adenopathy.  Musculoskeletal: Degenerative changes in the spine. No worrisome lytic or sclerotic lesions. Question mild radiation changes in the anterolateral right ribs, as before. No associated fracture.  IMPRESSION: Bilateral pulmonary nodules are stable and likely benign.   Electronically Signed   By: Lorin Picket M.D.   On: 01/30/2019 13:17    OV 09/23/2019  Subjective:  Patient ID: Satin Boal, female , DOB: 08-Aug-1945 , age 20 y.o. , MRN: [id number] , ADDRESS: [place] [id number]   09/23/2019 -   Chief Complaint  Patient presents with  . Follow-up    Pt states she has been doing okay since last visit and denies any recent flare ups with her asthma.     HPI Soniya Ashraf 74 y.o. -   Follow-up moderate persistent asthma: She has fixed obstructive disease that does  not respond to inhaler therapy although symptoms improved.  Therefore back in September 2020 I added Spiriva to the mix.  She says she only has still has mild dyspnea with fixed exertional relieved by rest.  It presents for walking an incline or stairs.  Otherwise she feels fine.  She does not think Spiriva has helped.  She was supposed to have pulmonary function test but because of COVID-19 pandemic this has been disrupted.  We do not have the data.  She said that she met anesthetist who said that he was surprised that she did not have a rescue inhaler.  She has never needed 1.  However I did agree with the anesthesiologist recommendation for her to carry an albuterol inhaler.  Therefore I settle make the prescription.  At this point in time she has had a Covid vaccine.  Pulmonary nodules: This is no stable for 2 years.  Results documented below.  I visualized and interpreted the CT scan myself.  There is report of emphysema on the CT scan but I could not appreciate it.  New issue: Fluid level in the esophagus: There is a new finding on the CT scan I personally visualized it.  She does have Dr. Tarri Glenn is a gastroenterologist.  Last seen towards the end of 2020 but before the CT scan.  There is no mention of any esophageal issues on Dr. Tarri Glenn medical record.  However this finding has postdated that visit.  I have asked the patient to take this up with Dr. Tarri Glenn.       IMPRESSION: CT CHEST 1. Ongoing stability of bilateral pulmonary nodules, most consistent favoring a benign etiology. 2. Right axillary node dissection, without findings of thoracic metastatic disease. 3. Aortic atherosclerosis (ICD10-I70.0), coronary artery atherosclerosis and emphysema (ICD10-J43.9). 4. Esophageal air fluid level suggests dysmotility or gastroesophageal reflux.   Electronically Signed   By: Abigail Miyamoto M.D.   On: 06/25/2019 13:32 ROS - per HPI    OV 12/04/2019  Subjective:  Patient ID: Kalman Shan, female , DOB: 12-May-1946 , age 73 y.o. , MRN: [id number] , ADDRESS: [place] [address]   12/04/2019 -   Chief Complaint  Patient presents with  . Follow-up    pt is here to go over spiro results.CT  of chestlung nodule. pt states gerd. pt has endoscopy schedule for jun 3rrd   Followup fixed obstrucitve lung disease  HPI Caila Cirelli 74 y.o.  - returns for followup. Continue spiriva and breo. No definite improvement in symptoms since spiriva. Overall stable/same. Has dyspnea for stairs and gardening but able to cope. Not a big issues. No flare ups. No prednisone use. No ER visit. She is interested in tapering down her medication regimen but was interested in hearing other therapeutic options. We discussed biologic due to her increased eos as a way to improve symptoms ( would be  A time limited trial) but she is not interested.Marland Kitchen PFT show improvement in fev1 but reduction in dlco - overall on average is stable. No new issues.    In terms of her esophageal issues - she is having EGD in June 2021 by Dr Albin Felling      OV 04/16/2020   Subjective:  Patient ID: Kalman Shan, female , DOB: 20-Apr-1946, age 85 y.o. years. , MRN: [id number],  ADDRESS: [place] [address] PCP  Regis Bill, Standley Brooking, MD Providers : Treatment Team:  Attending Provider: Brand Males, MD Panosh, Standley Brooking, MD as PCP - General  Patient Care Team: Burnis Medin, MD as PCP - Adelene Idler, MD Martinique, Amy, MD as Consulting Physician (Dermatology) Neldon Mc, Donnamarie Poag, MD as Consulting Physician (Allergy and Immunology) Marygrace Drought, MD as Consulting Physician (Ophthalmology) Clent Jacks, MD as Consulting Physician (Ophthalmology) Neldon Mc, Donnamarie Poag, MD as Consulting Physician (Allergy and Immunology) Brand Males, MD as Consulting Physician (Pulmonary Disease) Thornton Park, MD as Consulting Physician (Gastroenterology)   Chief  Complaint  Patient presents with  . Follow-up    PFT performed today.  Pt states her breathing has been worse since last visit. Denies any complaints of wheezing, cough, or chest tightness.    Follow-up fixed obstructive lung disease   HPI Iriana Artley 74 y.o. -last seen in May 2021.  After that she had endoscopy.  I could not visualize the results but I tried to located.  She did have it.  She tells me it was normal but she is on a short course of PPI.  At last visit we stopped her Spiriva as an effort to reduce her overall medication burden.  She says after that she started having more shortness of breath with exertion relieved by rest.  It is for activities.  She says that otherwise she is at baseline.  Ideally she would like to do gardening but even before stopping the Spiriva this was not possible because of shortness of breath.  She says she has resigned to the fact that she is 78 and therefore cannot do those.  This time because of the worsening shortness of breath she seems more open to having Biologics but at this same time as a first step she would like to go back on Spiriva.  She feels she really needs the Spiriva along with the [facility].  Her lung function tests are shown below and it shows decline.  She has had the flu shot but plans to have the Covid booster soon.   IMPRESSION:  CT chest 1. Ongoing stability of bilateral pulmonary nodules, most consistent favoring a benign etiology. 2. Right axillary node dissection, without findings of thoracic metastatic disease. 3. Aortic atherosclerosis (ICD10-I70.0), coronary artery atherosclerosis and emphysema (ICD10-J43.9). 4. Esophageal air fluid level suggests dysmotility or gastroesophageal reflux.   Electronically Signed   By: Marylyn Ishihara  Jobe Igo M.D.   On: 06/25/2019 13:32     OV 06/18/2020   Subjective:  Patient ID: Kalman Shan, female , DOB: April 30, 1946, age 68 y.o. years. , MRN: [id number],  ADDRESS: [place] [address] PCP  Regis Bill, Standley Brooking, MD Providers : Treatment Team:  Attending Provider: Brand Males, MD Patient Care Team: Burnis Medin, MD as PCP - Adelene Idler, MD Martinique, Amy, MD as Consulting Physician (Dermatology) Neldon Mc, Donnamarie Poag, MD as Consulting Physician (Allergy and Immunology) Marygrace Drought, MD as Consulting Physician (Ophthalmology) Clent Jacks, MD as Consulting Physician (Ophthalmology) Neldon Mc, Donnamarie Poag, MD as Consulting Physician (Allergy and Immunology) Brand Males, MD as Consulting Physician (Pulmonary Disease) Thornton Park, MD as Consulting Physician (Gastroenterology)    Chief Complaint  Patient presents with  . Follow-up    Asthma, doing ok    Follow-up fixed obstructive lung disease   HPI Owen Pagnotta 75 y.o. -at last visit I was concerned that FEV1 is declining.  She wanted to add Spiriva and see how she is doing.  She comes back she says she is feeling better.  Her ACT score has improved from 20- to 22.  Not sure this is clinically significant.  Her FEV1 appears stable/slightly declined.  This is despite adding Spiriva.  However she says she feels better.  Her CBC with differential shows use no chills that are acceptable at 200 cells per cubic millimeter.  Her blood IgE is normal.    She had a CT scan of the chest -her lung nodules are stable.  The radiologist is reporting mild bronchiectasis in the upper lobe is stable.  However this the first time we are hearing this to mention in the report.  She denies any sputum production.   Asthma Control Panel eos 300 always - last 2018, 100 in march 2019 and 400 in aug 2017 and 200 in oct 2021  IgE 8 - march 2019 and 13 in march 2021 12/06/2016 08/18/2017  09/22/2017  02/16/2018  03/26/2019  09/23/2019  04/16/2020  06/18/2020   Current Med Regimen  anoro breo breo breo breo and spiriva    ACT $Re'22      20 22  'RLv$ ACQ 5 point- 1 week. wtd avg score. <1.0 is good control  0.75-1.25 is grey zone. >1.25 poor control. Delta 0.5 is clinically meaningful  12/5 = 2.4 0.5 0.4 0.4 0.4    FeNO ppB  58  25    16  FeV1   0.96L/38% in May 2018 outside 1.11/425% -> 0.79/30% ratop 65 -> 39, DLCO 18.33/61%  1.14L/43% and ratio 52  Post BD 1.01L/55%, RAt 52, DLCO 82% Post GB 0.98L/38%, Ratio55, DLCO 82%  Planned intervention  for visit  Change anoro to breo low dose breo + get HRCT + blood allergy panel -. Did only IgE - normal 8         Lab Results  Component Value Date   NITRICOXIDE 58 08/18/2017    Asthma Control Test ACT Total Score  06/18/2020 22  04/16/2020 20  12/06/2016 22        PFT Results Latest Ref Rng & Units 06/18/2020 04/16/2020 11/29/2019 03/26/2019 09/22/2017  FVC-Pre L 2.05 2.02 2.29 2.17 2.24  FVC-Predicted Pre % 59 58 66 62 65  FVC-Post L 1.76 1.92 - - 2.05  FVC-Predicted Post % 51 55 - - 59  Pre FEV1/FVC % % 56 54 55 52 49  Post FEV1/FCV % % 55 52 - -  39  FEV1-Pre L 1.16 1.10 1.26 1.14 1.11  FEV1-Predicted Pre % 44 42 48 43 42  FEV1-Post L 0.98 1.01 - - 0.79  DLCO uncorrected ml/min/mmHg 18.60 18.94 16.30 17.13 18.33  DLCO UNC% % 82 84 72 75 61  DLCO corrected ml/min/mmHg 18.60 18.83 16.30 16.78 -  DLCO COR %Predicted % 82 83 72 74 -  DLVA Predicted % 134 137 110 132 96  TLC L - - - - 6.80  TLC % Predicted % - - - - 119  RV % Predicted % - - - - 181    CLINICAL DATA:  Follow-up pulmonary nodules. History of breast cancer. Nonproductive cough. Asthma.  EXAM: CT CHEST WITHOUT CONTRAST  TECHNIQUE: Multidetector CT imaging of the chest was performed following the standard protocol without intravenous contrast. High resolution imaging of the lungs, as well as inspiratory and expiratory imaging, was performed.  COMPARISON:  06/25/2019 chest CT.  FINDINGS: Cardiovascular: Normal heart size. No significant pericardial effusion/thickening. Left anterior descending coronary atherosclerosis. Great vessels are normal in course and  caliber.  Mediastinum/Nodes: No discrete thyroid nodules. Unremarkable esophagus. Surgical clips throughout the right axilla, unchanged. No pathologically enlarged axillary, mediastinal or discrete hilar nodes on these noncontrast images.  Lungs/Pleura: No pneumothorax. No pleural effusion. Numerous (greater than 20) solid pulmonary nodules scattered throughout both lungs, largest 7 mm in the anterior right lower lobe (series 3/image 106), all stable since at least 10/03/2017 CT. Sub solid 0.8 cm right upper lobe nodule (series 3/image 29), stable. No acute consolidative airspace disease, lung masses or new significant pulmonary nodules. Mild sharply marginated patchy subpleural reticulation in the anterior mid to upper right lung, stable, compatible with mild radiation fibrosis. Scattered mild cylindrical bronchiectasis throughout both lungs involving all lung lobes, most prominent in right middle and right upper lobes. No significant regions of ground-glass opacity, architectural distortion or frank honeycombing. No appreciable interval change in the bronchiectasis. No significant lobular air trapping or evidence of tracheobronchomalacia on the expiration sequence.  Upper abdomen: No acute abnormality.  Musculoskeletal: No aggressive appearing focal osseous lesions. Moderate thoracic spondylosis.  IMPRESSION: 1. Stable scattered mild cylindrical bronchiectasis throughout both lungs, most prominent in the right middle and right upper lobes. No evidence of interstitial lung disease. 2. 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 3. Numerous subcentimeter pulmonary nodules scattered throughout both lungs, all stable since at least 2019 CT, presumably benign. No thoracic adenopathy or other findings to suggest metastatic disease. 4. One vessel coronary atherosclerosis.   Electronically Signed   By: Ilona Sorrel M.D.   On: 06/11/2020  14:54      has a past medical history of Anxiety, Asthma, Breast cancer ([place]), Cataract, Chronic airway obstruction, not elsewhere classified, Coronary artery calcification seen on CAT scan (01/23/2018), Diverticulosis, Dysplasia, Fatty liver, Fibromyalgia, nonmelanoma skin cancer, Hyperlipidemia, Hypothyroidism, Malignant neoplasm of breast (female), unspecified site (11/28/2013), Osteoarthritis, Osteopenia, REM sleep behavior disorder, and Skin cancer.   reports that she quit smoking about 45 years ago. Her smoking use included cigarettes. She has a 8.00 pack-year smoking history. She has never used smokeless tobacco.  Past Surgical History:  Procedure Laterality Date  . BREAST LUMPECTOMY     on right, 1990 IIA radiation cmfp ajuction rx  . COLONOSCOPY    . KNEE SURGERY  2012  . NASAL SINUS SURGERY  2007   on right done by Dr. Wilburn Cornelia    Allergies  Allergen Reactions  . Hydrocodone Itching  .  Sulfa Antibiotics     Immunization History  Administered Date(s) Administered  . Fluad Quad(high Dose 65+) 03/12/2019, 03/30/2020  . Hepatitis A 08/30/2017  . Influenza Whole 04/10/2002, 06/01/2009, 05/05/2010, 05/22/2012, 02/20/2017  . Influenza, High Dose Seasonal PF 04/22/2016  . Influenza,inj,Quad PF,6+ Mos 06/04/2013  . Influenza-Unspecified 05/23/2011  . PFIZER SARS-COV-2 Vaccination 08/03/2019, 08/24/2019  . Pneumococcal Conjugate-13 10/09/2013  . Pneumococcal Polysaccharide-23 05/05/2010  . Td 08/11/2006, 02/27/2017  . Typhoid Inactivated 09/07/2017  . Zoster 09/11/2012  . Zoster Recombinat (Shingrix) 06/06/2018, 03/14/2019    Family History  Problem Relation Age of Onset  . Other Father        mysthenia gravis  . Lung cancer Sister   . Osteoporosis Other   . Thyroid disease Other   . Breast cancer Mother 64       possible uterine cancer diagnosis at age 90  . Osteoporosis Mother        no hip fracture  . Breast cancer Sister 49  . Lung cancer Sister 36        former smoker  . Lung cancer Paternal Grandmother   . Colon cancer Neg Hx   . Esophageal cancer Neg Hx   . Stomach cancer Neg Hx   . Rectal cancer Neg Hx      Current Outpatient Medications:  .  acetaminophen (TYLENOL) 500 MG tablet, Take 1,000 mg by mouth daily as needed. pain, Disp: , Rfl:  .  albuterol (VENTOLIN HFA) 108 (90 Base) MCG/ACT inhaler, Inhale 2 puffs into the lungs every 6 (six) hours as needed for wheezing or shortness of breath., Disp: 8 g, Rfl: 5 .  ALPRAZolam (XANAX) 0.25 MG tablet, alprazolam 0.25 mg tablet, Disp: , Rfl:  .  atorvastatin (LIPITOR) 20 MG tablet, Take 1 tablet (20 mg total) by mouth daily., Disp: 90 tablet, Rfl: 3 .  Cholecalciferol (VITAMIN D3) 50 MCG (2000 UT) TABS, Take by mouth daily., Disp: , Rfl:  .  diphenhydramine-acetaminophen (TYLENOL PM) 25-500 MG TABS, Take 1 tablet by mouth at bedtime as needed. sleep, Disp: , Rfl:  .  fluticasone furoate-vilanterol (BREO ELLIPTA) 100-25 MCG/INH AEPB, Inhale 1 puff into the lungs daily., Disp: 180 each, Rfl: 3 .  meloxicam (MOBIC) 15 MG tablet, meloxicam 15 mg tablet  TAKE 1 TABLET BY MOUTH EVERY DAY WITH A MEAL, Disp: , Rfl:  .  pantoprazole (PROTONIX) 40 MG tablet, Take 1 tablet (40 mg) every morning for 12 weeks.  After that, reduce to 1 tablet (40 mg) every other day for one additional week., Disp: 100 tablet, Rfl: 0 .  PARoxetine (PAXIL) 40 MG tablet, Take 1 tablet (40 mg total) by mouth at bedtime., Disp: 90 tablet, Rfl: 3 .  SYNTHROID 88 MCG tablet, Take 1 tablet (88 mcg total) by mouth daily., Disp: 90 tablet, Rfl: 3 .  Tiotropium Bromide Monohydrate (SPIRIVA RESPIMAT) 2.5 MCG/ACT AERS, Inhale 2 puffs into the lungs daily., Disp: 12 g, Rfl: 1 .  valACYclovir (VALTREX) 1000 MG tablet, TAKE 2 TABLETS BY MOUTH TWICE DAILY AS DIRECTED (Patient taking differently: Take 1,000 mg by mouth 3 times/day as needed-between meals & bedtime. TAKE 2 TABLETS BY MOUTH TWICE DAILY AS DIRECTED as needed for cold sores),  Disp: 90 tablet, Rfl: prn .  vitamin B-12 (CYANOCOBALAMIN) 1000 MCG tablet, Take 1,000 mcg by mouth daily., Disp: , Rfl:   Current Facility-Administered Medications:  .  0.9 %  sodium chloride infusion, 500 mL, Intravenous, Once, Thornton Park, MD .  0.9 %  sodium chloride infusion, 500 mL, Intravenous, Once, [place], MD      Objective:   Vitals:   06/18/20 1346  BP: 112/70  Pulse: 77  Temp: (!) 97.2 F (36.2 C)  TempSrc: Oral  SpO2: 97%  Weight: 163 lb (73.9 kg)  Height: $Remove'5\' 9"'mLJCxzg$  (1.753 m)    Estimated body mass index is 24.07 kg/m as calculated from the following:   Height as of this encounter: $RemoveBeforeD'5\' 9"'CpmcmMxMYCvybh$  (1.753 m).   Weight as of this encounter: 163 lb (73.9 kg).  $Rem'@WEIGHTCHANGE'WHlg$ @  Filed Weights   06/18/20 1346  Weight: 163 lb (73.9 kg)     Physical Exam    General: No distress. Looks well Neuro: Alert and Oriented x 3. GCS 15. Speech normal Psych: Pleasant Resp:  Barrel Chest - no.  Wheeze - no, Crackles - no, No overt respiratory distress CVS: Normal heart sounds. Murmurs - no Ext: Stigmata of Connective Tissue Disease - no HEENT: Normal upper airway. PEERL +. No post nasal drip        Assessment:       ICD-10-CM   1. SOB (shortness of breath)  R06.02 POCT EXHALED NITRIC OXIDE  2. Moderate persistent asthma without complication  W26.37   3. Multiple lung nodules on CT  R91.8        Plan:     Patient Instructions  Moderate persistent asthma without complication  - breathing test  With severe  fixed lung obstruction  - subjectively better after adding spiriva to breo. Objectively no better - cbc with diff and blood IgE normal - radiologist is reporing mild Upper lobe bronchiectasis as "stable" - first time we are seeing this in report  Plan  --Continue  albuterol as needed - you do need rescue inhaler - continue breo as before  -continue  Spiriva 2 puff daily - will check with radiology about bronchiectasis  - if 2nd one says it is  there we have to consider some additional blood work In Jan 2022 +/- bronchoscopy in fture  - repeat spirometry in 3-4 months  Multiple lung nodules on CT  - largest in 23mmm Right upper lobe -stable march -> july 2019 ->July 2020 -> dec 2020 -. Dec 2021  Plan -no further folowup  Fluid level in esophagus - seeon CT dec 2020   - per your report endoscopy by Dr Beavers  2021 without adversity  Plan   - PPI per GI  Followup 3-4 months spirometry pre and post BD 3-4 months or sooner with Dr Gaye Alken    Dr. Brand Males, M.D., F.C.C.P,  Pulmonary and Critical Care Medicine Staff Physician, [place] Director - Interstitial Lung Disease  Program  [facility] [place], [place], [address]  Pager: [phone], If no answer or between  15:00h - 7:00h: call [phone] Telephone: [phone]  2:23 PM 06/18/2020

## 2020-06-18 NOTE — Patient Instructions (Addendum)
Moderate persistent asthma without complication  - breathing test  With severe  fixed lung obstruction  - subjectively better after adding spiriva to breo. Objectively no better - cbc with diff and blood IgE normal - radiologist is reporing mild Upper lobe bronchiectasis as "stable" - first time we are seeing this in report  Plan  --Continue  albuterol as needed - you do need rescue inhaler - continue breo as before  -continue  Spiriva 2 puff daily - will check with radiology about bronchiectasis  - if 2nd one says it is there we have to consider some additional blood work In Jan 2022 +/- bronchoscopy in fture  - repeat spirometry in 3-4 months  Multiple lung nodules on CT  - largest in 21mmm Right upper lobe -stable march -> july 2019 ->July 2020 -> dec 2020 -. Dec 2021  Plan -no further folowup  Fluid level in esophagus - seeon CT dec 2020   - per your report endoscopy by Dr Beavers  2021 without adversity  Plan   - PPI per GI  Followup 3-4 months spirometry pre and post BD 3-4 months or sooner with Dr Chase Caller

## 2020-06-24 ENCOUNTER — Inpatient Hospital Stay: Admission: RE | Admit: 2020-06-24 | Payer: Medicare PPO | Source: Ambulatory Visit

## 2020-06-30 ENCOUNTER — Telehealth: Payer: Self-pay

## 2020-06-30 NOTE — Telephone Encounter (Signed)
Release: [id number] Printed and provided records to pt @ their request.

## 2020-07-17 ENCOUNTER — Ambulatory Visit
Admission: RE | Admit: 2020-07-17 | Discharge: 2020-07-17 | Disposition: A | Discharge status: Home / Self Care | Payer: Medicare PPO | Source: Ambulatory Visit | Attending: Internal Medicine | Admitting: Internal Medicine

## 2020-07-17 ENCOUNTER — Other Ambulatory Visit: Payer: Self-pay

## 2020-07-17 DIAGNOSIS — Z1231 Encounter for screening mammogram for malignant neoplasm of breast: Secondary | ICD-10-CM

## 2020-09-02 ENCOUNTER — Telehealth: Payer: Self-pay | Admitting: Internal Medicine

## 2020-09-02 NOTE — Telephone Encounter (Signed)
Call returned to patient, confirmed DOB. She reports she has already spoke to [facility] and this has been resolved.   Nothing further needed at this time.   Will leave message open for TP to chart.

## 2020-09-02 NOTE — Telephone Encounter (Signed)
Patient called and wants to make sure Dr. Chase Caller has reviewed CT chest with Radiology as he said last ov to evaluated new finding of Bronchiectasis on CT chest . She does not need call back just for Dr. Chase Caller to discuss in detail at [facility] in April with her    Will send to Dr. Chase Caller for [facility]

## 2020-09-29 ENCOUNTER — Other Ambulatory Visit: Payer: Self-pay

## 2020-09-29 ENCOUNTER — Ambulatory Visit (INDEPENDENT_AMBULATORY_CARE_PROVIDER_SITE_OTHER): Payer: Medicare PPO

## 2020-09-29 VITALS — BP 138/78 | HR 80 | Temp 98.2°F | Wt 162.9 lb

## 2020-09-29 DIAGNOSIS — Z0000 Encounter for general adult medical examination without abnormal findings: Secondary | ICD-10-CM | POA: Diagnosis not present

## 2020-09-29 NOTE — Progress Notes (Signed)
Chief Complaint  Patient presents with   Arm Pain    Patient complains of right arm pain, x6 weeks, Patient stated she located an insect bite on her right arm 2 days ago.     HPI: Jasmine Neal 75 y.o. come in for acute new problem visit   6 weeks  Of right arm pain felt was from zoomies with dog but then not better   Found bug bite  Tick ? 2 days ago not really itching  But could it be related?  Systemic symptoms.  No other rashes.  No travel   Dog 50 # p[place] bull.   Outside a lot but no specific  Exposure dx worms etc .   ROS: See pertinent positives and negatives per HPI.  No fever.  Past Medical History:  Diagnosis Date   Anxiety    Asthma    evalutated by pulmonary severe by spirometry no smoker currently   Breast cancer ([place])    hx of right lumpectomy, 1990 IIA radiation cmfp ajunct rx   Cataract    removed from both eyes   Chronic airway obstruction, not elsewhere classified    Coronary artery calcification seen on CAT scan 01/23/2018   Diverticulosis    only has like 2   Dysplasia    hx treated with cerv conization   Fatty liver    US done 2010   Fibromyalgia    Hx of nonmelanoma skin cancer     followed dr [place]    Hyperlipidemia    Hypothyroidism    Malignant neoplasm of breast (female), unspecified site 11/28/2013   Osteoarthritis    Osteopenia    REM sleep behavior disorder    Skin cancer    squamous cell    Family History  Problem Relation Age of Onset   Other Father        mysthenia gravis   Lung cancer Sister    Osteoporosis Other    Thyroid disease Other    Breast cancer Mother 49       possible uterine cancer diagnosis at age 59   Osteoporosis Mother        no hip fracture   Breast cancer Sister 59   Lung cancer Sister 52       former smoker   Lung cancer Paternal Grandmother    Colon cancer Neg Hx    Esophageal cancer Neg Hx    Stomach cancer Neg Hx    Rectal cancer Neg Hx     Social History    Socioeconomic History   Marital status: Widowed    Spouse name: Not on file   Number of children: 1   Years of education: Not on file   Highest education level: Not on file  Occupational History   Occupation: Retired Pharmacist, hospital  Tobacco Use   Smoking status: Former Smoker    Packs/day: 0.80    Years: 10.00    Pack years: 8.00    Types: Cigarettes    Quit date: 07/11/1974    Years since quitting: 46.2   Smokeless tobacco: Never Used  Vaping Use   Vaping Use: Never used  Substance and Sexual Activity   Alcohol use: Yes    Alcohol/week: 7.0 standard drinks    Types: 7 Standard drinks or equivalent per week    Comment: 1 glass of red wine nightly and socially   Drug use: No   Sexual activity: Yes    Partners: Male  Other Topics Concern  Not on file  Social History Narrative   Retired Pharmacist, hospital    hh of 1   Now has puppy    Quit smoking 30 + years ago. 20 year pack year smoking hx   Regular exercise- some  treadmill    8 hours sleep         Caffeine use: 1-2 cups of coffee/[employer] instant   Husband dx glioblastoma fall 15   Passed away fall Oct 27, 2014   Social Determinants of Health   Financial Resource Strain: Low Risk    Difficulty of Paying Living Expenses: Not hard at all  Food Insecurity: No Food Insecurity   Worried About Charity fundraiser in the Last Year: Never true   Ran Out of Food in the Last Year: Never true  Transportation Needs: No Transportation Needs   Lack of Transportation (Medical): No   Lack of Transportation (Non-Medical): No  Physical Activity: Sufficiently Active   Days of Exercise per Week: 5 days   Minutes of Exercise per Session: 30 min  Stress: No Stress Concern Present   Feeling of Stress : Not at all  Social Connections: Moderately Isolated   Frequency of Communication with Friends and Family: More than three times a week   Frequency of Social Gatherings with Friends and Family: More than three times a week   Attends  Religious Services: 1 to 4 times per year   Active Member of [employer] or Organizations: No   Attends Archivist Meetings: Never   Marital Status: Widowed    Outpatient Medications Prior to Visit  Medication Sig Dispense Refill   acetaminophen (TYLENOL) 500 MG tablet Take 1,000 mg by mouth daily as needed. pain     albuterol (VENTOLIN HFA) 108 (90 Base) MCG/ACT inhaler Inhale 2 puffs into the lungs every 6 (six) hours as needed for wheezing or shortness of breath. 8 g 5   atorvastatin (LIPITOR) 20 MG tablet Take 1 tablet (20 mg total) by mouth daily. 90 tablet 3   Cholecalciferol (VITAMIN D3) 50 MCG (2000 UT) TABS Take by mouth daily.     diphenhydramine-acetaminophen (TYLENOL PM) 25-500 MG TABS Take 1 tablet by mouth at bedtime as needed. sleep     fluticasone furoate-vilanterol (BREO ELLIPTA) 100-25 MCG/INH AEPB Inhale 1 puff into the lungs daily. 180 each 3   PARoxetine (PAXIL) 40 MG tablet Take 1 tablet (40 mg total) by mouth at bedtime. 90 tablet 3   SYNTHROID 88 MCG tablet Take 1 tablet (88 mcg total) by mouth daily. 90 tablet 3   Tiotropium Bromide Monohydrate (SPIRIVA RESPIMAT) 2.5 MCG/ACT AERS Inhale into the lungs in the morning and at bedtime. 2 puffs daily     valACYclovir (VALTREX) 1000 MG tablet TAKE 2 TABLETS BY MOUTH TWICE DAILY AS DIRECTED (Patient taking differently: Take 1,000 mg by mouth 3 times/day as needed-between meals & bedtime. TAKE 2 TABLETS BY MOUTH TWICE DAILY AS DIRECTED as needed for cold sores) 90 tablet prn   vitamin B-12 (CYANOCOBALAMIN) 1000 MCG tablet Take 1,000 mcg by mouth daily.     vitamin C (ASCORBIC ACID) 500 MG tablet Take 500 mg by mouth daily.     ALPRAZolam (XANAX) 0.25 MG tablet alprazolam 0.25 mg tablet     meloxicam (MOBIC) 15 MG tablet meloxicam 15 mg tablet  TAKE 1 TABLET BY MOUTH EVERY DAY WITH A MEAL (Patient not taking: Reported on 09/29/2020)     Facility-Administered Medications Prior to Visit  Medication Dose  Route Frequency Provider  Last Rate Last Admin   0.9 %  sodium chloride infusion  500 mL Intravenous Once [place], MD       0.9 %  sodium chloride infusion  500 mL Intravenous Once [place], MD         EXAM:  BP 122/70 (BP Location: Left Arm, Patient Position: Sitting, Cuff Size: Normal)    Pulse 79    Temp 98.1 F (36.7 C) (Oral)    Ht 5\' 9"  (1.753 m)    Wt 163 lb 3.2 oz (74 kg)    SpO2 97%    BMI 24.10 kg/m   Body mass index is 24.1 kg/m.  GENERAL: vitals reviewed and listed above, alert, oriented, appears well hydrated and in no acute distress HEENT: atraumatic, conjunctiva  clear, no obvious abnormalities on inspection of external nose and ears OP : Masked NECK: no obvious masses on inspection palpation  Skin: Rash right posterior upper arm initial.and redness and serpiginous rash about 4 cm.  Palpable no other rash no vesicles. MS: moves all extremities without noticeable focal  abnormality right shoulder discomfort deltoid anterior pain with elevation abduction but good range of motion PSYCH: pleasant and cooperative, no obvious depression or anxiety       Lab Results  Component Value Date   WBC 6.5 04/16/2020   HGB 13.2 04/16/2020   HCT 40.3 04/16/2020   PLT 163.0 04/16/2020   GLUCOSE 91 03/23/2020   CHOL 147 03/23/2020   TRIG 60 03/23/2020   HDL 57 03/23/2020   LDLDIRECT 137.0 09/11/2012   LDLCALC 76 03/23/2020   ALT 17 03/23/2020   AST 17 03/23/2020   NA 140 03/23/2020   K 3.8 03/23/2020   CL 104 03/23/2020   CREATININE 0.89 03/23/2020   BUN 19 03/23/2020   CO2 28 03/23/2020   TSH 0.63 03/23/2020   HGBA1C 5.7 03/12/2019   BP Readings from Last 3 Encounters:  09/30/20 122/70  09/29/20 138/78  06/18/20 112/70    ASSESSMENT AND PLAN:  Discussed the following assessment and plan:  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 Does not look at all like Lyme disease reassured. Can do body checks for rash -Patient advised to return or notify health care team  if  new concerns arise.  Patient Instructions  Looks like cutaneous  Larva migrans   Take antiparasitic medication.    The pain seems like shoulder   Strain  And consider seeing sports medicine if on going  :. dont think related to the  Bite  .    Standley Brooking. Jayra Choyce M.D.

## 2020-09-29 NOTE — Progress Notes (Signed)
Subjective:   Jasmine Neal is a 75 y.o. female who presents for Medicare Annual (Subsequent) preventive examination.  Review of Systems     Cardiac Risk Factors include: advanced age (>9men, >17 women);dyslipidemia     Objective:    Today's Vitals   09/29/20 1031  BP: 138/78  Pulse: 80  Temp: 98.2 F (36.8 C)  SpO2: 96%  Weight: 162 lb 14.4 oz (73.9 kg)  PainSc: 2    Body mass index is 24.06 kg/m.  Advanced Directives 09/29/2020 12/13/2017  Does Patient Have a Medical Advance Directive? Yes Yes  Type of Advance Directive [place] in Chart? No - copy requested -    Current Medications (verified) Outpatient Encounter Medications as of 09/29/2020  Medication Sig  . acetaminophen (TYLENOL) 500 MG tablet Take 1,000 mg by mouth daily as needed. pain  . albuterol (VENTOLIN HFA) 108 (90 Base) MCG/ACT inhaler Inhale 2 puffs into the lungs every 6 (six) hours as needed for wheezing or shortness of breath.  . ALPRAZolam (XANAX) 0.25 MG tablet alprazolam 0.25 mg tablet  . atorvastatin (LIPITOR) 20 MG tablet Take 1 tablet (20 mg total) by mouth daily.  . Cholecalciferol (VITAMIN D3) 50 MCG (2000 UT) TABS Take by mouth daily.  . diphenhydramine-acetaminophen (TYLENOL PM) 25-500 MG TABS Take 1 tablet by mouth at bedtime as needed. sleep  . fluticasone furoate-vilanterol (BREO ELLIPTA) 100-25 MCG/INH AEPB Inhale 1 puff into the lungs daily.  Marland Kitchen PARoxetine (PAXIL) 40 MG tablet Take 1 tablet (40 mg total) by mouth at bedtime.  Marland Kitchen SYNTHROID 88 MCG tablet Take 1 tablet (88 mcg total) by mouth daily.  . valACYclovir (VALTREX) 1000 MG tablet TAKE 2 TABLETS BY MOUTH TWICE DAILY AS DIRECTED (Patient taking differently: Take 1,000 mg by mouth 3 times/day as needed-between meals & bedtime. TAKE 2 TABLETS BY MOUTH TWICE DAILY AS DIRECTED as needed for cold sores)  . vitamin B-12 (CYANOCOBALAMIN) 1000 MCG tablet Take 1,000 mcg by mouth  daily.  . vitamin C (ASCORBIC ACID) 500 MG tablet Take 500 mg by mouth daily.  . meloxicam (MOBIC) 15 MG tablet meloxicam 15 mg tablet  TAKE 1 TABLET BY MOUTH EVERY DAY WITH A MEAL (Patient not taking: Reported on 09/29/2020)  . [DISCONTINUED] pantoprazole (PROTONIX) 40 MG tablet Take 1 tablet (40 mg) every morning for 12 weeks.  After that, reduce to 1 tablet (40 mg) every other day for one additional week. (Patient not taking: Reported on 09/29/2020)  . [DISCONTINUED] Tiotropium Bromide Monohydrate (SPIRIVA RESPIMAT) 2.5 MCG/ACT AERS Inhale 2 puffs into the lungs daily. (Patient not taking: Reported on 09/29/2020)   Facility-Administered Encounter Medications as of 09/29/2020  Medication  . 0.9 %  sodium chloride infusion  . 0.9 %  sodium chloride infusion    Allergies (verified) Hydrocodone and Sulfa antibiotics   History: Past Medical History:  Diagnosis Date  . Anxiety   . Asthma    evalutated by pulmonary severe by spirometry no smoker currently  . Breast cancer ([place])    hx of right lumpectomy, 1990 IIA radiation cmfp ajunct rx  . Cataract    removed from both eyes  . Chronic airway obstruction, not elsewhere classified   . Coronary artery calcification seen on CAT scan 01/23/2018  . Diverticulosis    only has like 2  . Dysplasia    hx treated with cerv conization  . Fatty liver    US done 2010  .  Fibromyalgia   . Hx of nonmelanoma skin cancer     followed dr [place]   . Hyperlipidemia   . Hypothyroidism   . Malignant neoplasm of breast (female), unspecified site 11/28/2013  . Osteoarthritis   . Osteopenia   . REM sleep behavior disorder   . Skin cancer    squamous cell   Past Surgical History:  Procedure Laterality Date  . BREAST LUMPECTOMY     on right, 1990 IIA radiation cmfp ajuction rx  . COLONOSCOPY    . KNEE SURGERY  10-20-10  . NASAL SINUS SURGERY  10/19/2005   on right done by Dr. Wilburn Cornelia   Family History  Problem Relation Age of Onset  . Other Father         mysthenia gravis  . Lung cancer Sister   . Osteoporosis Other   . Thyroid disease Other   . Breast cancer Mother 6       possible uterine cancer diagnosis at age 22  . Osteoporosis Mother        no hip fracture  . Breast cancer Sister 20  . Lung cancer Sister 31       former smoker  . Lung cancer Paternal Grandmother   . Colon cancer Neg Hx   . Esophageal cancer Neg Hx   . Stomach cancer Neg Hx   . Rectal cancer Neg Hx    Social History   Socioeconomic History  . Marital status: Widowed    Spouse name: Not on file  . Number of children: 1  . Years of education: Not on file  . Highest education level: Not on file  Occupational History  . Occupation: Retired Pharmacist, hospital  Tobacco Use  . Smoking status: Former Smoker    Packs/day: 0.80    Years: 10.00    Pack years: 8.00    Types: Cigarettes    Quit date: 07/11/1974    Years since quitting: 46.2  . Smokeless tobacco: Never Used  Vaping Use  . Vaping Use: Never used  Substance and Sexual Activity  . Alcohol use: Yes    Alcohol/week: 7.0 standard drinks    Types: 7 Standard drinks or equivalent per week    Comment: 1 glass of red wine nightly and socially  . Drug use: No  . Sexual activity: Yes    Partners: Male  Other Topics Concern  . Not on file  Social History Narrative   Retired Pharmacist, hospital    hh of 1   Now has puppy    Quit smoking 30 + years ago. 20 year pack year smoking hx   Regular exercise- some  treadmill    8 hours sleep         Caffeine use: 1-2 cups of coffee/[employer] instant   Husband dx glioblastoma fall 15   Passed away fall 10-20-14   Social Determinants of Health   Financial Resource Strain: Low Risk   . Difficulty of Paying Living Expenses: Not hard at all  Food Insecurity: No Food Insecurity  . Worried About Charity fundraiser in the Last Year: Never true  . Ran Out of Food in the Last Year: Never true  Transportation Needs: No Transportation Needs  . Lack of Transportation (Medical): No  . Lack  of Transportation (Non-Medical): No  Physical Activity: Sufficiently Active  . Days of Exercise per Week: 5 days  . Minutes of Exercise per Session: 30 min  Stress: No Stress Concern Present  . Feeling of Stress :  Not at all  Social Connections: Moderately Isolated  . Frequency of Communication with Friends and Family: More than three times a week  . Frequency of Social Gatherings with Friends and Family: More than three times a week  . Attends Religious Services: 1 to 4 times per year  . Active Member of Clubs or Organizations: No  . Attends Archivist Meetings: Never  . Marital Status: Widowed    Tobacco Counseling Counseling given: Not Answered   Clinical Intake:  Pre-visit preparation completed: Yes  Pain : 0-10 Pain Score: 2  Pain Type: Acute pain Pain Location: Arm Pain Orientation: Right Pain Descriptors / Indicators: Aching,Tender,Radiating Pain Onset: More than a month ago Pain Frequency: Intermittent     BMI - recorded: 24.06 Nutritional Status: BMI of 19-24  Normal Nutritional Risks: None Diabetes: No  How often do you need to have someone help you when you read instructions, pamphlets, or other written materials from your doctor or pharmacy?: 1 - Never  Diabetic?No  Interpreter Needed?: No  Information entered by :: Charlott Rakes, LPN   Activities of Daily Living In your present state of health, do you have any difficulty performing the following activities: 09/29/2020  Hearing? Y  Comment mild loss in groups  Vision? N  Difficulty concentrating or making decisions? Y  Comment memory at times  Walking or climbing stairs? Y  Comment SOB  Dressing or bathing? N  Doing errands, shopping? N  Preparing Food and eating ? N  Using the Toilet? N  In the past six months, have you accidently leaked urine? Y  Comment occassional pad at times  Do you have problems with loss of bowel control? N  Managing your Medications? N  Managing your  Finances? N  Housekeeping or managing your Housekeeping? N  Some recent data might be hidden    Patient Care Team: Panosh, Standley Brooking, MD as PCP - General Jerrell Belfast, MD [place]ique, Amy, MD as Consulting Physician (Dermatology) Neldon Mc Donnamarie Poag, MD as Consulting Physician (Allergy and Immunology) Marygrace Drought, MD as Consulting Physician (Ophthalmology) Clent Jacks, MD as Consulting Physician (Ophthalmology) Neldon Mc, Donnamarie Poag, MD as Consulting Physician (Allergy and Immunology) Brand Males, MD as Consulting Physician (Pulmonary Disease) Thornton Park, MD as Consulting Physician (Gastroenterology)  Indicate any recent Medical Services you may have received from other than [facility] providers in the past year (date may be approximate).     Assessment:   This is a routine wellness examination for Reshma.  Hearing/Vision screen  Hearing Screening   125Hz  250Hz  500Hz  1000Hz  2000Hz  3000Hz  4000Hz  6000Hz  8000Hz   Right ear:           Left ear:           Comments: Pt mild loss in groups   Vision Screening Comments: Pt follows up with Dr Jeneen Rinks for annual eye exam  Dietary issues and exercise activities discussed: Current Exercise Habits: Home exercise routine, Type of exercise: walking (stationary bike), Time (Minutes): 30, Frequency (Times/Week): 5, Weekly Exercise (Minutes/Week): 150  Goals    . Exercise 150 min/wk Moderate Activity     Will try to go to [employer] now    . Patient Stated     Lose weight      Depression Screen PHQ 2/9 Scores 09/29/2020 03/30/2020 03/12/2019 12/13/2017 02/27/2017 02/16/2016 10/10/2014  PHQ - 2 Score 0 0 0 0 0 0 0  PHQ- 9 Score - 0 - - - - -    Fall Risk Fall  Risk  09/29/2020 03/30/2020 03/12/2019 03/06/2018 12/13/2017  Falls in the past year? 1 0 0 No Yes  Comment - - - - walking through hotel in [place]   Number falls in past yr: 1 - 0 - 1  Injury with Fall? 1 - 0 - Yes  Comment bruises - - - -  Risk for fall due to : Impaired vision;Impaired  balance/gait;Impaired mobility - - - -  Follow up Falls prevention discussed - Falls evaluation completed - Education provided    FALL RISK PREVENTION PERTAINING TO THE HOME:  Any stairs in or around the home? Yes  If so, are there any without handrails? No  Home free of loose throw rugs in walkways, pet beds, electrical cords, etc? Yes  Adequate lighting in your home to reduce risk of falls? Yes   ASSISTIVE DEVICES UTILIZED TO PREVENT FALLS:  Life alert? No  Use of a cane, walker or w/c? No  Grab bars in the bathroom? No  Shower chair or bench in shower? No  Elevated toilet seat or a handicapped toilet? No   TIMED UP AND GO:  Was the test performed? Yes .  Length of time to ambulate 10 feet: 10 sec.   Gait steady and fast without use of assistive device  Cognitive Function:Declined 6CIT        Immunizations Immunization History  Administered Date(s) Administered  . Fluad Quad(high Dose 65+) 03/12/2019, 03/30/2020  . Hepatitis A 08/30/2017  . Influenza Whole 04/10/2002, 06/01/2009, 05/05/2010, 05/22/2012, 02/20/2017  . Influenza, High Dose Seasonal PF 04/22/2016  . Influenza,inj,Quad PF,6+ Mos 06/04/2013  . Influenza-Unspecified 05/23/2011  . PFIZER(Purple Top)SARS-COV-2 Vaccination 08/03/2019, 08/24/2019  . Pneumococcal Conjugate-13 10/09/2013  . Pneumococcal Polysaccharide-23 05/05/2010  . Td 08/11/2006, 02/27/2017  . Typhoid Inactivated 09/07/2017  . Zoster 09/11/2012  . Zoster Recombinat (Shingrix) 06/06/2018, 03/14/2019    TDAP status: Up to date  Flu Vaccine status: Up to date  Pneumococcal vaccine status: Up to date  Covid-19 vaccine status: Completed vaccines  Qualifies for Shingles Vaccine? Yes   Zostavax completed Yes   Shingrix Completed?: Yes  Screening Tests Health Maintenance  Topic Date Due  . COVID-19 Vaccine (3 - Pfizer risk 4-dose series) 09/21/2019  . TETANUS/TDAP  02/28/2027  . COLONOSCOPY (Pts 45-47yrs Insurance coverage will need  to be confirmed)  08/13/2029  . INFLUENZA VACCINE  Completed  . DEXA SCAN  Completed  . Hepatitis C Screening  Completed  . PNA vac Low Risk Adult  Completed  . HPV VACCINES  Aged Out    Health Maintenance  Health Maintenance Due  Topic Date Due  . COVID-19 Vaccine (3 - Pfizer risk 4-dose series) 09/21/2019    Colorectal cancer screening: Type of screening: Colonoscopy. Completed 08/14/19. Repeat every 10 years  Mammogram status: Completed 07/17/20. Repeat every year  Bone Density status: Completed 03/07/17. Results reflect: Bone density results: OSTEOPENIA. Repeat every 2 years.  Additional Screening:  Hepatitis C Screening: Completed 09/11/12  Vision Screening: Recommended annual ophthalmology exams for early detection of glaucoma and other disorders of the eye. Is the patient up to date with their annual eye exam?  Yes  Who is the provider or what is the name of the office in which the patient attends annual eye exams? Dr Jeneen Rinks If pt is not established with a provider, would they like to be referred to a provider to establish care? No .   Dental Screening: Recommended annual dental exams for proper oral hygiene  Community Resource Referral /  Chronic Care Management: CRR required this visit?  No   CCM required this visit?  No      Plan:     I have personally reviewed and noted the following in the patient's chart:   . Medical and social history . Use of alcohol, tobacco or illicit drugs  . Current medications and supplements . Functional ability and status . Nutritional status . Physical activity . Advanced directives . List of other physicians . Hospitalizations, surgeries, and ER visits in previous 12 months . Vitals . Screenings to include cognitive, depression, and falls . Referrals and appointments  In addition, I have reviewed and discussed with patient certain preventive protocols, quality metrics, and best practice recommendations. A written personalized  care plan for preventive services as well as general preventive health recommendations were provided to patient.     Willette Brace, LPN   [id number]   Nurse Notes: None

## 2020-09-29 NOTE — Patient Instructions (Signed)
Jasmine Neal , Thank you for taking time to come for your Medicare Wellness Visit. I appreciate your ongoing commitment to your health goals. Please review the following plan we discussed and let me know if I can assist you in the future.   Screening recommendations/referrals: Colonoscopy: Done 08/14/19 Mammogram: one 07/17/20 Bone Density: Done 03/07/17 Recommended yearly ophthalmology/optometry visit for glaucoma screening and checkup Recommended yearly dental visit for hygiene and checkup  Vaccinations: Influenza vaccine: Up to date Pneumococcal vaccine: Up to date Tdap vaccine: Up to date Shingles vaccine: Done 06/06/18 & 03/14/19   Covid-19:Completed 1/23 & 08/24/19  Advanced directives: Please bring a copy of your health care power of attorney and living will to the office at your convenience.  Conditions/risks identified: Lose weight   Next appointment: Follow up in one year for your annual wellness visit    Preventive Care 65 Years and Older, Female Preventive care refers to lifestyle choices and visits with your health care provider that can promote health and wellness. What does preventive care include?  A yearly physical exam. This is also called an annual well check.  Dental exams once or twice a year.  Routine eye exams. Ask your health care provider how often you should have your eyes checked.  Personal lifestyle choices, including:  Daily care of your teeth and gums.  Regular physical activity.  Eating a healthy diet.  Avoiding tobacco and drug use.  Limiting alcohol use.  Practicing safe sex.  Taking low-dose aspirin every day.  Taking vitamin and mineral supplements as recommended by your health care provider. What happens during an annual well check? The services and screenings done by your health care provider during your annual well check will depend on your age, overall health, lifestyle risk factors, and family history of disease. Counseling  Your  health care provider may ask you questions about your:  Alcohol use.  Tobacco use.  Drug use.  Emotional well-being.  Home and relationship well-being.  Sexual activity.  Eating habits.  History of falls.  Memory and ability to understand (cognition).  Work and work Statistician.  Reproductive health. Screening  You may have the following tests or measurements:  Height, weight, and BMI.  Blood pressure.  Lipid and cholesterol levels. These may be checked every 5 years, or more frequently if you are over 41 years old.  Skin check.  Lung cancer screening. You may have this screening every year starting at age 67 if you have a 30-pack-year history of smoking and currently smoke or have quit within the past 15 years.  Fecal occult blood test (FOBT) of the stool. You may have this test every year starting at age 16.  Flexible sigmoidoscopy or colonoscopy. You may have a sigmoidoscopy every 5 years or a colonoscopy every 10 years starting at age 67.  Hepatitis C blood test.  Hepatitis B blood test.  Sexually transmitted disease (STD) testing.  Diabetes screening. This is done by checking your blood sugar (glucose) after you have not eaten for a while (fasting). You may have this done every 1-3 years.  Bone density scan. This is done to screen for osteoporosis. You may have this done starting at age 15.  Mammogram. This may be done every 1-2 years. Talk to your health care provider about how often you should have regular mammograms. Talk with your health care provider about your test results, treatment options, and if necessary, the need for more tests. Vaccines  Your health care provider may recommend  certain vaccines, such as:  Influenza vaccine. This is recommended every year.  Tetanus, diphtheria, and acellular pertussis (Tdap, Td) vaccine. You may need a Td booster every 10 years.  Zoster vaccine. You may need this after age 9.  Pneumococcal 13-valent  conjugate (PCV13) vaccine. One dose is recommended after age 81.  Pneumococcal polysaccharide (PPSV23) vaccine. One dose is recommended after age 45. Talk to your health care provider about which screenings and vaccines you need and how often you need them. This information is not intended to replace advice given to you by your health care provider. Make sure you discuss any questions you have with your health care provider. Document Released: 07/24/2015 Document Revised: 03/16/2016 Document Reviewed: 04/28/2015 Elsevier Interactive Patient Education  2017 [place] Prevention in the Home Falls can cause injuries. They can happen to people of all ages. There are many things you can do to make your home safe and to help prevent falls. What can I do on the outside of my home?  Regularly fix the edges of walkways and driveways and fix any cracks.  Remove anything that might make you trip as you walk through a door, such as a raised step or threshold.  Trim any bushes or trees on the path to your home.  Use bright outdoor lighting.  Clear any walking paths of anything that might make someone trip, such as rocks or tools.  Regularly check to see if handrails are loose or broken. Make sure that both sides of any steps have handrails.  Any raised decks and porches should have guardrails on the edges.  Have any leaves, snow, or ice cleared regularly.  Use sand or salt on walking paths during winter.  Clean up any spills in your garage right away. This includes oil or grease spills. What can I do in the bathroom?  Use night lights.  Install grab bars by the toilet and in the tub and shower. Do not use towel bars as grab bars.  Use non-skid mats or decals in the tub or shower.  If you need to sit down in the shower, use a plastic, non-slip stool.  Keep the floor dry. Clean up any water that spills on the floor as soon as it happens.  Remove soap buildup in the tub or  shower regularly.  Attach bath mats securely with double-sided non-slip rug tape.  Do not have throw rugs and other things on the floor that can make you trip. What can I do in the bedroom?  Use night lights.  Make sure that you have a light by your bed that is easy to reach.  Do not use any sheets or blankets that are too big for your bed. They should not hang down onto the floor.  Have a firm chair that has side arms. You can use this for support while you get dressed.  Do not have throw rugs and other things on the floor that can make you trip. What can I do in the kitchen?  Clean up any spills right away.  Avoid walking on wet floors.  Keep items that you use a lot in easy-to-reach places.  If you need to reach something above you, use a strong step stool that has a grab bar.  Keep electrical cords out of the way.  Do not use floor polish or wax that makes floors slippery. If you must use wax, use non-skid floor wax.  Do not have throw rugs and other  things on the floor that can make you trip. What can I do with my stairs?  Do not leave any items on the stairs.  Make sure that there are handrails on both sides of the stairs and use them. Fix handrails that are broken or loose. Make sure that handrails are as long as the stairways.  Check any carpeting to make sure that it is firmly attached to the stairs. Fix any carpet that is loose or worn.  Avoid having throw rugs at the top or bottom of the stairs. If you do have throw rugs, attach them to the floor with carpet tape.  Make sure that you have a light switch at the top of the stairs and the bottom of the stairs. If you do not have them, ask someone to add them for you. What else can I do to help prevent falls?  Wear shoes that:  Do not have high heels.  Have rubber bottoms.  Are comfortable and fit you well.  Are closed at the toe. Do not wear sandals.  If you use a stepladder:  Make sure that it is fully  opened. Do not climb a closed stepladder.  Make sure that both sides of the stepladder are locked into place.  Ask someone to hold it for you, if possible.  Clearly mark and make sure that you can see:  Any grab bars or handrails.  First and last steps.  Where the edge of each step is.  Use tools that help you move around (mobility aids) if they are needed. These include:  Canes.  Walkers.  Scooters.  Crutches.  Turn on the lights when you go into a dark area. Replace any light bulbs as soon as they burn out.  Set up your furniture so you have a clear path. Avoid moving your furniture around.  If any of your floors are uneven, fix them.  If there are any pets around you, be aware of where they are.  Review your medicines with your doctor. Some medicines can make you feel dizzy. This can increase your chance of falling. Ask your doctor what other things that you can do to help prevent falls. This information is not intended to replace advice given to you by your health care provider. Make sure you discuss any questions you have with your health care provider. Document Released: 04/23/2009 Document Revised: 12/03/2015 Document Reviewed: 08/01/2014 Elsevier Interactive Patient Education  2017 [employer].

## 2020-09-30 ENCOUNTER — Ambulatory Visit: Payer: Medicare PPO | Admitting: Internal Medicine

## 2020-09-30 ENCOUNTER — Encounter: Payer: Self-pay | Admitting: Internal Medicine

## 2020-09-30 VITALS — BP 122/70 | HR 79 | Temp 98.1°F | Ht 69.0 in | Wt 163.2 lb

## 2020-09-30 DIAGNOSIS — M79601 Pain in right arm: Secondary | ICD-10-CM

## 2020-09-30 DIAGNOSIS — B769 Hookworm disease, unspecified: Secondary | ICD-10-CM

## 2020-09-30 MED ORDER — ALBENDAZOLE 200 MG PO TABS: 400.0000 mg | ORAL_TABLET | Freq: Every day | ORAL | 0 refills | Status: AC

## 2020-09-30 NOTE — Patient Instructions (Signed)
Looks like cutaneous  Larva migrans   Take antiparasitic medication.    The pain seems like shoulder   Strain  And consider seeing sports medicine if on going  :. dont think related to the  Bite  .

## 2020-10-04 ENCOUNTER — Other Ambulatory Visit: Payer: Self-pay | Admitting: Internal Medicine

## 2020-10-05 ENCOUNTER — Telehealth: Payer: Self-pay | Admitting: Internal Medicine

## 2020-10-05 DIAGNOSIS — M79601 Pain in right arm: Secondary | ICD-10-CM

## 2020-10-05 NOTE — Telephone Encounter (Signed)
Patient was calling to see if Dr. Regis Bill has placed an order for a referral for her arm.  Please advise

## 2020-10-05 NOTE — Telephone Encounter (Signed)
Patient informed that the referral to sports medicine has been placed and is aware that someone will contact her to schedule an appointment.

## 2020-10-05 NOTE — Addendum Note (Signed)
Addended byShanon Ace K on: 10/05/2020 12:28 PM   Modules accepted: Orders

## 2020-10-05 NOTE — Telephone Encounter (Signed)
referral order to sports medicine.  Has been placed

## 2020-10-07 NOTE — Progress Notes (Signed)
Subjective:    CC: R arm pain  I, Molly Weber, LAT, ATC, am serving as scribe for Dr. Lynne Leader.  HPI: Pt is a 75 y/o female presenting w/ c/o R arm pain x 6 weeks after playing w/ her 55# pit bull.  She locates her pain to her R superior shoulder and upper arm .  She denies any specific injury.  R shoulder mechanical symptoms: No Aggravating factors: pushing/pulling; R shoulder AROM that progressively worsens after 90 deg; R shoulder functional IR Treatments tried: Tylenol  Pertinent review of Systems: No fevers or chills  Relevant historical information: Remote history of breast cancer.   Objective:    Vitals:   10/08/20 0927  BP: 130/74  Pulse: 72  SpO2: 96%   General: Well Developed, well nourished, and in no acute distress.   MSK: Right shoulder normal-appearing Nontender. Range of motion limited abduction to 140 degrees.  Internal rotation limited to lumbar spine.  Normal external rotation. Strength abduction and external rotation 4/5 normal internal rotation. Positive Hawkins and Neer's test.  Positive empty can test. Negative Yergason's and speeds test.  Pulses capillary refill and sensation are intact distally.  Lab and Radiology Results  X-ray images right shoulder obtained today personally and independently interpreted Moderate AC DJD.  No significant glenohumeral DJD.  Surgical clips present in axilla. Await formal radiology review  Diagnostic Limited MSK Ultrasound of: Right shoulder Steps tendon is intact.  Moderate hypoechoic fluid tracks within tendon sheath Subscapularis tendon is intact and normal. Supraspinatus tendon is present.  Moderate subacromial bursitis is present however small amount of hypoechoic fluid extends into the superficial portion of the supraspinatus tendon indicating a partial-thickness bursal surface tear.  No significant retraction is present.  No significant activity on Doppler in this area. Infraspinatus tendon is  intact normal. AC DJD. Impression: Concern for partial-thickness supraspinatus rotator cuff tear bursal surface.      Impression and Recommendations:    Assessment and Plan: 75 y.o. female with right shoulder pain.  Concern for partial rotator cuff tear of the supraspinatus tendon.  Discussed options.  Plan to treat with physical therapy home exercise program and nitroglycerin patch protocol.  Recheck back in about 6 weeks.  Return sooner if needed.Marland Kitchen  PDMP not reviewed this encounter. Orders Placed This Encounter  Procedures  . [place] LIMITED JOINT SPACE STRUCTURES UP RIGHT(NO LINKED CHARGES)    Order Specific Question:   Reason for Exam (SYMPTOM  OR DIAGNOSIS REQUIRED)    Answer:   R arm pain    Order Specific Question:   Preferred imaging location?    Answer:   [place]  . DG Shoulder Right    Standing Status:   Future    Number of Occurrences:   1    Standing Expiration Date:   10/08/2021    Order Specific Question:   Reason for Exam (SYMPTOM  OR DIAGNOSIS REQUIRED)    Answer:   eval shoudler pain r    Order Specific Question:   Preferred imaging location?    Answer:   [facility]  . Ambulatory referral to Physical Therapy    Referral Priority:   Routine    Referral Type:   Physical Medicine    Referral Reason:   Specialty Services Required    Requested Specialty:   Physical Therapy   Meds ordered this encounter  Medications  . nitroGLYCERIN (NITRODUR - DOSED IN MG/24 HR) 0.2 mg/hr patch    Sig:  Apply 1/4 patch daily to tendon for tendonitis.    Dispense:  30 patch    Refill:  1    Discussed warning signs or symptoms. Please see discharge instructions. Patient expresses understanding.   The above documentation has been reviewed and is accurate and complete Lynne Leader, M.D.

## 2020-10-08 ENCOUNTER — Encounter: Payer: Self-pay | Admitting: Family Medicine

## 2020-10-08 ENCOUNTER — Ambulatory Visit: Payer: Self-pay

## 2020-10-08 ENCOUNTER — Ambulatory Visit: Payer: Medicare PPO | Admitting: Family Medicine

## 2020-10-08 ENCOUNTER — Ambulatory Visit (INDEPENDENT_AMBULATORY_CARE_PROVIDER_SITE_OTHER)
Admission: RE | Admit: 2020-10-08 | Discharge: 2020-10-08 | Disposition: A | Discharge status: Home / Self Care | Payer: Medicare PPO | Source: Ambulatory Visit | Attending: Family Medicine | Admitting: Family Medicine

## 2020-10-08 ENCOUNTER — Other Ambulatory Visit: Payer: Self-pay

## 2020-10-08 VITALS — BP 130/74 | HR 72 | Ht 69.0 in | Wt 163.8 lb

## 2020-10-08 DIAGNOSIS — M79601 Pain in right arm: Secondary | ICD-10-CM

## 2020-10-08 MED ORDER — NITROGLYCERIN 0.2 MG/HR TD PT24: MEDICATED_PATCH | TRANSDERMAL | 1 refills | Status: DC

## 2020-10-08 NOTE — Patient Instructions (Addendum)
Thank you for coming in today.  I've referred you to Physical Therapy.  Let us know if you don't hear from them in one week.  Please get an Xray today before you leave  Nitroglycerin Protocol   Apply 1/4 nitroglycerin patch to affected area daily.  Change position of patch within the affected area every 24 hours.  You may experience a headache during the first 1-2 weeks of using the patch, these should subside.  If you experience headaches after beginning nitroglycerin patch treatment, you may take your preferred over the counter pain reliever.  Another side effect of the nitroglycerin patch is skin irritation or rash related to patch adhesive.  Please notify our office if you develop more severe headaches or rash, and stop the patch.  Tendon healing with nitroglycerin patch may require 12 to 24 weeks depending on the extent of injury.  Men should not use if taking Viagra, Cialis, or Levitra.   Do not use if you have migraines or rosacea.    Recheck with me in 6 weeks.   Let me know if this is not working.

## 2020-10-09 ENCOUNTER — Other Ambulatory Visit (HOSPITAL_COMMUNITY)
Admission: RE | Admit: 2020-10-09 | Discharge: 2020-10-09 | Disposition: A | Discharge status: Home / Self Care | Payer: Medicare PPO | Source: Ambulatory Visit | Attending: Internal Medicine | Admitting: Internal Medicine

## 2020-10-09 DIAGNOSIS — Z20822 Contact with and (suspected) exposure to covid-19: Secondary | ICD-10-CM | POA: Insufficient documentation

## 2020-10-09 DIAGNOSIS — Z01812 Encounter for preprocedural laboratory examination: Secondary | ICD-10-CM | POA: Diagnosis present

## 2020-10-09 LAB — SARS CORONAVIRUS 2 (TAT 6-24 HRS): SARS Coronavirus 2: NEGATIVE

## 2020-10-09 NOTE — Progress Notes (Signed)
X-ray right shoulder shows some arthritis of the small joint at the top of the shoulder but otherwise looks normal.

## 2020-10-12 ENCOUNTER — Ambulatory Visit: Payer: Medicare PPO | Admitting: Internal Medicine

## 2020-10-12 ENCOUNTER — Other Ambulatory Visit: Payer: Self-pay

## 2020-10-12 ENCOUNTER — Encounter: Payer: Self-pay | Admitting: Internal Medicine

## 2020-10-12 ENCOUNTER — Ambulatory Visit (INDEPENDENT_AMBULATORY_CARE_PROVIDER_SITE_OTHER): Payer: Medicare PPO | Admitting: Internal Medicine

## 2020-10-12 VITALS — BP 130/70 | HR 82 | Temp 97.0°F | Ht 69.0 in | Wt 162.4 lb

## 2020-10-12 DIAGNOSIS — J4540 Moderate persistent asthma, uncomplicated: Secondary | ICD-10-CM | POA: Diagnosis not present

## 2020-10-12 DIAGNOSIS — R0602 Shortness of breath: Secondary | ICD-10-CM

## 2020-10-12 LAB — PULMONARY FUNCTION TEST
FEF 25-75 Post: 0.73 L/sec
FEF 25-75 Pre: 0.42 L/sec
FEF2575-%Change-Post: 73 %
FEF2575-%Pred-Post: 37 %
FEF2575-%Pred-Pre: 21 %
FEV1-%Change-Post: 31 %
FEV1-%Pred-Post: 47 %
FEV1-%Pred-Pre: 35 %
FEV1-Post: 1.23 L
FEV1-Pre: 0.94 L
FEV1FVC-%Change-Post: 23 %
FEV1FVC-%Pred-Pre: 64 %
FEV6-%Change-Post: 11 %
FEV6-%Pred-Post: 62 %
FEV6-%Pred-Pre: 56 %
FEV6-Post: 2.05 L
FEV6-Pre: 1.85 L
FEV6FVC-%Change-Post: 4 %
FEV6FVC-%Pred-Post: 104 %
FEV6FVC-%Pred-Pre: 100 %
FVC-%Change-Post: 6 %
FVC-%Pred-Post: 59 %
FVC-%Pred-Pre: 55 %
FVC-Post: 2.05 L
FVC-Pre: 1.93 L
Post FEV1/FVC ratio: 60 %
Post FEV6/FVC ratio: 100 %
Pre FEV1/FVC ratio: 49 %
Pre FEV6/FVC Ratio: 96 %

## 2020-10-12 NOTE — Progress Notes (Signed)
Spirometry pre and post done today. 

## 2020-10-12 NOTE — Progress Notes (Signed)
   PCP Panosh, Wanda K, MD   HPI  IOV 08/18/2017  Chief Complaint  Patient presents with  . pulmonary consult    SOB with activity wheezing with exercise and a cough in the morning.    75-year-old female whose husband died seen in 2015 for a lung nodule and he subsequently died from glioblastoma multiform A.  She is here as a new consult.  Her issues that she has had over 10 years of shortness of breath with exertion relieved by rest.  Insidious onset.  Slowly getting worse.  She also has cough with early morning wheezing.  The cough is dry but occasionally she has congestion.  Symptoms are indeed progressive.  Several years ago she was on Symbicort or Advair.  She does not remember if this improved her.  Then a few years ago she got changed to ANORO along with Asmanex.  6 months ago the Asmanex was stopped.  Overall these changes have not helped her.  She has learned to live with this. Vacuuming can  cause problems symptoms .  There is no associated chest pain.  She does not have hypertension or diabetes.  She is worried about the risk of lung cancer.  There is because a paternal grandfather and sister died from lung cancer.  Lab review shows that she has eosinophilia that is mild at 300 cells per cubic millimeter.  She has had previous allergy testing that was positive done by Dr. Kozlow.  Last CT scan of the chest was in 2006.  In May 2018 she had spirometry at the outside.  I personally visualized the trace.  FEV1 0.9 L / 38% FVC 46% with an obstructed ratio.  Exam nitric oxide today is elevated at 58 ppb. IN terms of smoking history, she smoke 1 ppd x 10 years but quit when she was 27.,> reports allrgy test 10 years ago as positive only for dust mites   OV 09/22/2017  Chief Complaint  Patient presents with  . Follow-up    PFT done, no asthma flare at this time    Lashai S Kreitz returns for follow-up.  She tells me that after starting Brio her symptoms are significantly improved.  In  fact asthma control questionnaire shows a symptom score of 0.5.  She is now waking up at night because of asthma symptoms.  When she wakes up she has no symptoms.  She feels very slightly limited in her activities only when walking uphill.  And she is actually has a very little shortness of breath but again only while walking uphill or climbing stairs.  She has never wheezed and not use albuterol for rescue at all.  Overall is a huge improvement.   Walking desaturation test on 09/22/2017 185 feet x 3 laps on ROOM AIR:  did no desaturate. Rest pulse ox was 100%, final pulse ox was 97%. HR response 69/min at rest to 93/min at peak exertion. Patient Carmelle S Wigen  Did not Desaturate < 88% . Franklin S Mizrachi yes  Desaturated </= 3% points. Breiana S Pasquariello yes did get tachyardic.  But she did drop pulse ox by 3 points.  In talking to her she tells me that she is got this fixed exertional dyspnea that is relieved by rest.  This may be slowly progressive.  There is no cough or wheezing.  She had pulmonary function test today and this shows significant severe obstruction and on bronchodilator responsiveness flow volume loop abnormalities.  The DLCO   was also reduced at 60%.  She is very surprised that her lung function abnormalities also severe because she does not feel the symptoms as much and she under perceives the symptoms relative to the lung function abnormality.  She is very keen on getting a CT scan of the chest because of family history of lung cancer.   OV 02/16/2018  Chief Complaint  Patient presents with  . Follow-up    5 month follow up to discuss test results.    Azia S Joplin eturns for follow-up of 2 issues  Moderate persistent asthma with fixed exertional dyspnea: She is tells me that with Brio her symptoms of improvement. Given the fixed exertional dyspnea is better. Overall asthma control questionnaire shows a score of 0.4 showing excellent control. She is not waking up in the middle of the  night with symptoms when she wakes up she has no symptoms. She is very slightly limited in her activities because of asthma and she's experienced very little shortness of breath. She does not have any wheeze or does not use albuterol for rescue  Follow-up multiple lung nodules: the largest in 9mmm RUL nodule and this stable march 2019  -> July 2019    IMPRESSION: CT chest Near complete re solution of ill-defined nodular opacity in medial left lower lobe, consistent with resolving inflammatory or infectious etiology.  Multiple other sub-cm bilateral pulmonary nodules remain stable, largest in right lung apex measuring 9 mm. Recommend continued follow-up by chest CT without contrast in 12 months. This recommendation follows the consensus statement: Guidelines for Management of Small Pulmonary Nodules Detected on CT Images: From the Fleischner Society 2017; Radiology 2017; 284:228-243.   Electronically Signed   By: John  Stahl M.D.   On: 01/23/2018 15:19   OV 03/26/2019  Subjective:  Patient ID: Cindra S Chiou, female , DOB: 01/12/1946 , age 73 y.o. , MRN: [id number] , ADDRESS: [address] [place] [address]   03/26/2019 -   Chief Complaint  Patient presents with  . Moderate persistent asthma without complication    Discuss results of PFT      ICD-10-CM   1. Moderate persistent asthma without complication  J45.40 Tiotropium Bromide Monohydrate (SPIRIVA RESPIMAT) 1.25 MCG/ACT AERS  2. Multiple lung nodules on CT  R91.8 Tiotropium Bromide Monohydrate (SPIRIVA RESPIMAT) 1.25 MCG/ACT AERS     HPI Lidie S Streb 75 y.o. -presents for the above problems.  Visit is nearly after 1 year.  In the interim she is stable.  She is up-to-date with her vaccines.  July 2019 because of coronary artery calcifications and ongoing fixed exertional dyspnea she had a cardiac stress test I reviewed the result and this was normal.  She says the Brio is helping her but despite this she  continues to have fixed exertional dyspnea.  Her asthma control questionnaire is stable.  She had repeat lung function today it is stable and unchanged which is good but she still has severe fixed obstructive lung defect.  She has a family history of myasthenia gravis and her dad but she denies any end of the day fatigue or eyelid drooping symptoms.  Moreover the pulmonary function test is not restrictive.  There is no cough we discussed and she is interested in adding inhaler therapy.  She is not interested in second opinion for a fixed obstructive lung disease defect.   For multiple lung nodules: She has 1-1/2-year stability at this point.      CT Chest Lungs/Pleura: Multiple bilateral   pulmonary nodules, as before. Index nodule in the apical segment right upper lobe measures 7 mm (5 x 8 mm), stable. Post radiation scarring in the anterior subpleural right lung. No pleural fluid. Airway is unremarkable.  Upper Abdomen: Visualized portions of the liver, gallbladder, adrenal glands, kidneys, spleen, pancreas, stomach and bowel are unremarkable. No upper abdominal adenopathy.  Musculoskeletal: Degenerative changes in the spine. No worrisome lytic or sclerotic lesions. Question mild radiation changes in the anterolateral right ribs, as before. No associated fracture.  IMPRESSION: Bilateral pulmonary nodules are stable and likely benign.   Electronically Signed   By: Lorin Picket M.D.   On: 01/30/2019 13:17    OV 09/23/2019  Subjective:  Patient ID: Satin Boal, female , DOB: 08-Aug-1945 , age 20 y.o. , MRN: [id number] , ADDRESS: [place] [address]   09/23/2019 -   Chief Complaint  Patient presents with  . Follow-up    Pt states she has been doing okay since last visit and denies any recent flare ups with her asthma.     HPI Soniya Ashraf 75 y.o. -   Follow-up moderate persistent asthma: She has fixed obstructive disease that does  not respond to inhaler therapy although symptoms improved.  Therefore back in September 2020 I added Spiriva to the mix.  She says she only has still has mild dyspnea with fixed exertional relieved by rest.  It presents for walking an incline or stairs.  Otherwise she feels fine.  She does not think Spiriva has helped.  She was supposed to have pulmonary function test but because of COVID-19 pandemic this has been disrupted.  We do not have the data.  She said that she met anesthetist who said that he was surprised that she did not have a rescue inhaler.  She has never needed 1.  However I did agree with the anesthesiologist recommendation for her to carry an albuterol inhaler.  Therefore I settle make the prescription.  At this point in time she has had a Covid vaccine.  Pulmonary nodules: This is no stable for 2 years.  Results documented below.  I visualized and interpreted the CT scan myself.  There is report of emphysema on the CT scan but I could not appreciate it.  New issue: Fluid level in the esophagus: There is a new finding on the CT scan I personally visualized it.  She does have Dr. Tarri Glenn is a gastroenterologist.  Last seen towards the end of 2020 but before the CT scan.  There is no mention of any esophageal issues on Dr. Tarri Glenn medical record.  However this finding has postdated that visit.  I have asked the patient to take this up with Dr. Tarri Glenn.       IMPRESSION: CT CHEST 1. Ongoing stability of bilateral pulmonary nodules, most consistent favoring a benign etiology. 2. Right axillary node dissection, without findings of thoracic metastatic disease. 3. Aortic atherosclerosis (ICD10-I70.0), coronary artery atherosclerosis and emphysema (ICD10-J43.9). 4. Esophageal air fluid level suggests dysmotility or gastroesophageal reflux.   Electronically Signed   By: Abigail Miyamoto M.D.   On: 06/25/2019 13:32 ROS - per HPI    OV 12/04/2019  Subjective:  Patient ID: Kalman Shan, female , DOB: 12-May-1946 , age 73 y.o. , MRN: [id number] , ADDRESS: [place] [address]   12/04/2019 -   Chief Complaint  Patient presents with  . Follow-up    pt is here to go over spiro results.CT  of chestlung nodule. pt states gerd. pt has endoscopy schedule for jun 3rrd   Followup fixed obstrucitve lung disease  HPI Rakia Frayne 75 y.o.  - returns for followup. Continue spiriva and breo. No definite improvement in symptoms since spiriva. Overall stable/same. Has dyspnea for stairs and gardening but able to cope. Not a big issues. No flare ups. No prednisone use. No ER visit. She is interested in tapering down her medication regimen but was interested in hearing other therapeutic options. We discussed biologic due to her increased eos as a way to improve symptoms ( would be  A time limited trial) but she is not interested.Marland Kitchen PFT show improvement in fev1 but reduction in dlco - overall on average is stable. No new issues.    In terms of her esophageal issues - she is having EGD in June 2021 by Dr Albin Felling      OV 04/16/2020   Subjective:  Patient ID: Kalman Shan, female , DOB: 1945/10/22, age 72 y.o. years. , MRN: [id number],  ADDRESS: [place] [address] PCP  Regis Bill, Standley Brooking, MD Providers : Treatment Team:  Attending Provider: Brand Males, MD Panosh, Standley Brooking, MD as PCP - General  Patient Care Team: Burnis Medin, MD as PCP - Adelene Idler, MD Martinique, Amy, MD as Consulting Physician (Dermatology) Neldon Mc, Donnamarie Poag, MD as Consulting Physician (Allergy and Immunology) Marygrace Drought, MD as Consulting Physician (Ophthalmology) Clent Jacks, MD as Consulting Physician (Ophthalmology) Neldon Mc, Donnamarie Poag, MD as Consulting Physician (Allergy and Immunology) Brand Males, MD as Consulting Physician (Pulmonary Disease) Thornton Park, MD as Consulting Physician (Gastroenterology)   Chief  Complaint  Patient presents with  . Follow-up    PFT performed today.  Pt states her breathing has been worse since last visit. Denies any complaints of wheezing, cough, or chest tightness.    Follow-up fixed obstructive lung disease   HPI Ramsey Midgett 75 y.o. -last seen in May 2021.  After that she had endoscopy.  I could not visualize the results but I tried to located.  She did have it.  She tells me it was normal but she is on a short course of PPI.  At last visit we stopped her Spiriva as an effort to reduce her overall medication burden.  She says after that she started having more shortness of breath with exertion relieved by rest.  It is for activities.  She says that otherwise she is at baseline.  Ideally she would like to do gardening but even before stopping the Spiriva this was not possible because of shortness of breath.  She says she has resigned to the fact that she is 44 and therefore cannot do those.  This time because of the worsening shortness of breath she seems more open to having Biologics but at this same time as a first step she would like to go back on Spiriva.  She feels she really needs the Spiriva along with the [facility].  Her lung function tests are shown below and it shows decline.  She has had the flu shot but plans to have the Covid booster soon.   IMPRESSION:  CT chest 1. Ongoing stability of bilateral pulmonary nodules, most consistent favoring a benign etiology. 2. Right axillary node dissection, without findings of thoracic metastatic disease. 3. Aortic atherosclerosis (ICD10-I70.0), coronary artery atherosclerosis and emphysema (ICD10-J43.9). 4. Esophageal air fluid level suggests dysmotility or gastroesophageal reflux.   Electronically Signed   By: Marylyn Ishihara  Jobe Igo M.D.   On: 06/25/2019 13:32     OV 06/18/2020   Subjective:  Patient ID: Kalman Shan, female , DOB: [birth year], age 61 y.o. years. , MRN: [id number],  ADDRESS: [place] [address] PCP  Regis Bill, Standley Brooking, MD Providers : Treatment Team:  Attending Provider: Brand Males, MD Patient Care Team: Burnis Medin, MD as PCP - Adelene Idler, MD Martinique, Amy, MD as Consulting Physician (Dermatology) Neldon Mc, Donnamarie Poag, MD as Consulting Physician (Allergy and Immunology) Marygrace Drought, MD as Consulting Physician (Ophthalmology) Clent Jacks, MD as Consulting Physician (Ophthalmology) Neldon Mc, Donnamarie Poag, MD as Consulting Physician (Allergy and Immunology) Brand Males, MD as Consulting Physician (Pulmonary Disease) Thornton Park, MD as Consulting Physician (Gastroenterology)    Chief Complaint  Patient presents with  . Follow-up    Asthma, doing ok    Follow-up fixed obstructive lung disease   HPI Carinne Brandenburger 75 y.o. -at last visit I was concerned that FEV1 is declining.  She wanted to add Spiriva and see how she is doing.  She comes back she says she is feeling better.  Her ACT score has improved from 20- to 22.  Not sure this is clinically significant.  Her FEV1 appears stable/slightly declined.  This is despite adding Spiriva.  However she says she feels better.  Her CBC with differential shows use no chills that are acceptable at 200 cells per cubic millimeter.  Her blood IgE is normal.    She had a CT scan of the chest -her lung nodules are stable.  The radiologist is reporting mild bronchiectasis in the upper lobe is stable.  However this the first time we are hearing this to mention in the report.  She denies any sputum production.   Asthma Control Panel eos 300 always - last 2018, 100 in march 2019 and 400 in aug 2017 and 200 in oct 2021  IgE 8 - march 2019 and 13 in march 2021 12/06/2016 08/18/2017  09/22/2017  02/16/2018  03/26/2019  09/23/2019  04/16/2020  06/18/2020   Current Med Regimen  anoro breo breo breo breo and spiriva    ACT $Re'22      20 22  'rUa$ ACQ 5 point- 1 week. wtd avg score. <1.0 is good control  0.75-1.25 is grey zone. >1.25 poor control. Delta 0.5 is clinically meaningful  12/5 = 2.4 0.5 0.4 0.4 0.4    FeNO ppB  58  25    16  FeV1   0.96L/38% in May 2018 outside 1.11/425% -> 0.79/30% ratop 65 -> 39, DLCO 18.33/61%  1.14L/43% and ratio 52  Post BD 1.01L/55%, RAt 52, DLCO 82% Post GB 0.98L/38%, Ratio55, DLCO 82%  Planned intervention  for visit  Change anoro to breo low dose breo + get HRCT + blood allergy panel -. Did only IgE - normal 8         Lab Results  Component Value Date   NITRICOXIDE 58 08/18/2017    Asthma Control Test ACT Total Score  06/18/2020 22  04/16/2020 20  12/06/2016 22        PFT Results Latest Ref Rng & Units 06/18/2020 04/16/2020 11/29/2019 03/26/2019 09/22/2017  FVC-Pre L 2.05 2.02 2.29 2.17 2.24  FVC-Predicted Pre % 59 58 66 62 65  FVC-Post L 1.76 1.92 - - 2.05  FVC-Predicted Post % 51 55 - - 59  Pre FEV1/FVC % % 56 54 55 52 49  Post FEV1/FCV % % 55 52 - -  39  FEV1-Pre L 1.16 1.10 1.26 1.14 1.11  FEV1-Predicted Pre % 44 42 48 43 42  FEV1-Post L 0.98 1.01 - - 0.79  DLCO uncorrected ml/min/mmHg 18.60 18.94 16.30 17.13 18.33  DLCO UNC% % 82 84 72 75 61  DLCO corrected ml/min/mmHg 18.60 18.83 16.30 16.78 -  DLCO COR %Predicted % 82 83 72 74 -  DLVA Predicted % 134 137 110 132 96  TLC L - - - - 6.80  TLC % Predicted % - - - - 119  RV % Predicted % - - - - 181    CLINICAL DATA:  Follow-up pulmonary nodules. History of breast cancer. Nonproductive cough. Asthma.  EXAM: CT CHEST WITHOUT CONTRAST  TECHNIQUE: Multidetector CT imaging of the chest was performed following the standard protocol without intravenous contrast. High resolution imaging of the lungs, as well as inspiratory and expiratory imaging, was performed.  COMPARISON:  06/25/2019 chest CT.  FINDINGS: Cardiovascular: Normal heart size. No significant pericardial effusion/thickening. Left anterior descending coronary atherosclerosis. Great vessels are normal in course and  caliber.  Mediastinum/Nodes: No discrete thyroid nodules. Unremarkable esophagus. Surgical clips throughout the right axilla, unchanged. No pathologically enlarged axillary, mediastinal or discrete hilar nodes on these noncontrast images.  Lungs/Pleura: No pneumothorax. No pleural effusion. Numerous (greater than 20) solid pulmonary nodules scattered throughout both lungs, largest 7 mm in the anterior right lower lobe (series 3/image 106), all stable since at least 10/03/2017 CT. Sub solid 0.8 cm right upper lobe nodule (series 3/image 29), stable. No acute consolidative airspace disease, lung masses or new significant pulmonary nodules. Mild sharply marginated patchy subpleural reticulation in the anterior mid to upper right lung, stable, compatible with mild radiation fibrosis. Scattered mild cylindrical bronchiectasis throughout both lungs involving all lung lobes, most prominent in right middle and right upper lobes. No significant regions of ground-glass opacity, architectural distortion or frank honeycombing. No appreciable interval change in the bronchiectasis. No significant lobular air trapping or evidence of tracheobronchomalacia on the expiration sequence.  Upper abdomen: No acute abnormality.  Musculoskeletal: No aggressive appearing focal osseous lesions. Moderate thoracic spondylosis.  IMPRESSION: 1. Stable scattered mild cylindrical bronchiectasis throughout both lungs, most prominent in the right middle and right upper lobes. No evidence of interstitial lung disease. 2. 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 3. Numerous subcentimeter pulmonary nodules scattered throughout both lungs, all stable since at least 2019 CT, presumably benign. No thoracic adenopathy or other findings to suggest metastatic disease. 4. One vessel coronary atherosclerosis.   Electronically Signed   By: Ilona Sorrel M.D.   On: 06/11/2020  14:54     OV 10/12/2020  Subjective:  Patient ID: Kalman Shan, female , DOB: [birth year] , age 76 y.o. , MRN: [id number] , ADDRESS: [place] [address] PCP Regis Bill, Standley Brooking, MD Patient Care Team: Burnis Medin, MD as PCP - Adelene Idler, MD Martinique, Amy, MD as Consulting Physician (Dermatology) Neldon Mc, Donnamarie Poag, MD as Consulting Physician (Allergy and Immunology) Marygrace Drought, MD as Consulting Physician (Ophthalmology) Clent Jacks, MD as Consulting Physician (Ophthalmology) Neldon Mc, Donnamarie Poag, MD as Consulting Physician (Allergy and Immunology) Brand Males, MD as Consulting Physician (Pulmonary Disease) Thornton Park, MD as Consulting Physician (Gastroenterology)  This Provider for this visit: Treatment Team:  Attending Provider: Brand Males, MD    10/12/2020 -   Chief Complaint  Patient presents with  . Follow-up    PFT performed today.  Pt states she is  about the same since last visit. States she still becomes SOB with exertion.     HPI Jaleeyah Munce 75 y.o. -returns for follow-up.  Overall she is stable.  She continues to have fixed exertional dyspnea on exertion such as bending over gardening climbing stairs.  This is not any worse.  No wheezing no orthopnea no proximal nocturnal dyspnea no chest tightness no hemoptysis.  No fever chills or no weight loss.  Her pulmonary function test was continued fixed obstruction.  Prebronchodilator it is worse than before.  Postbronchodilator it appears to be in baseline.  She feels baseline.  Last visit I emailed a second radiologist asking for opinion on bronchiectasis but that email is lost from the secure server.  I emailed the radiologist again.  Reviewed the labs indicate that there is no alpha-1 antitrypsin phenotype on the record.  She continues with Breo and Spiriva.  She feels the Spiriva is not helping her.  She is okay just doing Breo.  We discussed about second opinion at  [facility] but she wants to hold off through the summer 2022 and then decide.   Asthma Control Test ACT Total Score  10/12/2020 22  06/18/2020 22  04/16/2020 20      Lab Results  Component Value Date   NITRICOXIDE 58 08/18/2017     PFT  PFT Results Latest Ref Rng & Units 10/12/2020 06/18/2020 04/16/2020 11/29/2019 03/26/2019 09/22/2017  FVC-Pre L 1.93 2.05 2.02 2.29 2.17 2.24  FVC-Predicted Pre % 55 59 58 66 62 65  FVC-Post L 2.05 1.76 1.92 - - 2.05  FVC-Predicted Post % 59 51 55 - - 59  Pre FEV1/FVC % % 49 56 54 55 52 49  Post FEV1/FCV % % 60 55 52 - - 39  FEV1-Pre L 0.94 1.16 1.10 1.26 1.14 1.11  FEV1-Predicted Pre % 35 44 42 48 43 42  FEV1-Post L 1.23 0.98 1.01 - - 0.79  DLCO uncorrected ml/min/mmHg - 18.60 18.94 16.30 17.13 18.33  DLCO UNC% % - 82 84 72 75 61  DLCO corrected ml/min/mmHg - 18.60 18.83 16.30 16.78 -  DLCO COR %Predicted % - 82 83 72 74 -  DLVA Predicted % - 134 137 110 132 96  TLC L - - - - - 6.80  TLC % Predicted % - - - - - 119  RV % Predicted % - - - - - 181       has a past medical history of Anxiety, Asthma, Breast cancer ([place]), Cataract, Chronic airway obstruction, not elsewhere classified, Coronary artery calcification seen on CAT scan (01/23/2018), Diverticulosis, Dysplasia, Fatty liver, Fibromyalgia, nonmelanoma skin cancer, Hyperlipidemia, Hypothyroidism, Malignant neoplasm of breast (female), unspecified site (11/28/2013), Osteoarthritis, Osteopenia, REM sleep behavior disorder, and Skin cancer.   reports that she quit smoking about 46 years ago. Her smoking use included cigarettes. She started smoking about 58 years ago. She has a 10.00 pack-year smoking history. She has never used smokeless tobacco.  Past Surgical History:  Procedure Laterality Date  . BREAST LUMPECTOMY     on right, 1990 IIA radiation cmfp ajuction rx  . COLONOSCOPY    . KNEE SURGERY  2012  . NASAL SINUS SURGERY  2007   on right done by Dr. Wilburn Cornelia    Allergies   Allergen Reactions  . Hydrocodone Itching    Immunization History  Administered Date(s) Administered  . Fluad Quad(high Dose 65+) 03/12/2019, 03/30/2020  . Hepatitis A 08/30/2017  . Influenza Whole 04/10/2002, 06/01/2009,  05/05/2010, 05/22/2012, 02/20/2017  . Influenza, High Dose Seasonal PF 04/22/2016  . Influenza,inj,Quad PF,6+ Mos 06/04/2013  . Influenza-Unspecified 05/23/2011  . PFIZER(Purple Top)SARS-COV-2 Vaccination 08/03/2019, 08/24/2019, 04/22/2020  . Pneumococcal Conjugate-13 10/09/2013  . Pneumococcal Polysaccharide-23 05/05/2010  . Td 08/11/2006, 02/27/2017  . Typhoid Inactivated 09/07/2017  . Zoster 09/11/2012  . Zoster Recombinat (Shingrix) 06/06/2018, 03/14/2019    Family History  Problem Relation Age of Onset  . Other Father        mysthenia gravis  . Lung cancer Sister   . Osteoporosis Other   . Thyroid disease Other   . Breast cancer Mother 13       possible uterine cancer diagnosis at age 83  . Osteoporosis Mother        no hip fracture  . Breast cancer Sister 67  . Lung cancer Sister 33       former smoker  . Lung cancer Paternal Grandmother   . Colon cancer Neg Hx   . Esophageal cancer Neg Hx   . Stomach cancer Neg Hx   . Rectal cancer Neg Hx      Current Outpatient Medications:  .  acetaminophen (TYLENOL) 500 MG tablet, Take 1,000 mg by mouth daily as needed. pain, Disp: , Rfl:  .  albuterol (VENTOLIN HFA) 108 (90 Base) MCG/ACT inhaler, Inhale 2 puffs into the lungs every 6 (six) hours as needed for wheezing or shortness of breath., Disp: 8 g, Rfl: 5 .  atorvastatin (LIPITOR) 20 MG tablet, Take 1 tablet (20 mg total) by mouth daily., Disp: 90 tablet, Rfl: 3 .  BREO ELLIPTA 100-25 MCG/INH AEPB, INHALE 1 PUFF ONE TIME DAILY, Disp: 180 each, Rfl: 3 .  Cholecalciferol (VITAMIN D3) 50 MCG (2000 UT) TABS, Take by mouth daily., Disp: , Rfl:  .  diphenhydramine-acetaminophen (TYLENOL PM) 25-500 MG TABS, Take 1 tablet by mouth at bedtime as needed.  sleep, Disp: , Rfl:  .  nitroGLYCERIN (NITRODUR - DOSED IN MG/24 HR) 0.2 mg/hr patch, Apply 1/4 patch daily to tendon for tendonitis., Disp: 30 patch, Rfl: 1 .  PARoxetine (PAXIL) 40 MG tablet, Take 1 tablet (40 mg total) by mouth at bedtime., Disp: 90 tablet, Rfl: 3 .  SYNTHROID 88 MCG tablet, Take 1 tablet (88 mcg total) by mouth daily., Disp: 90 tablet, Rfl: 3 .  Tiotropium Bromide Monohydrate (SPIRIVA RESPIMAT) 2.5 MCG/ACT AERS, Inhale into the lungs in the morning and at bedtime. 2 puffs daily, Disp: , Rfl:  .  valACYclovir (VALTREX) 1000 MG tablet, TAKE 2 TABLETS BY MOUTH TWICE DAILY AS DIRECTED (Patient taking differently: Take 1,000 mg by mouth 3 times/day as needed-between meals & bedtime. TAKE 2 TABLETS BY MOUTH TWICE DAILY AS DIRECTED as needed for cold sores), Disp: 90 tablet, Rfl: prn .  vitamin B-12 (CYANOCOBALAMIN) 1000 MCG tablet, Take 1,000 mcg by mouth daily., Disp: , Rfl:  .  vitamin C (ASCORBIC ACID) 500 MG tablet, Take 500 mg by mouth daily., Disp: , Rfl:   Current Facility-Administered Medications:  .  0.9 %  sodium chloride infusion, 500 mL, Intravenous, Once, [place], MD .  0.9 %  sodium chloride infusion, 500 mL, Intravenous, Once, [place], MD      Objective:   Vitals:   10/12/20 1333  BP: 130/70  Pulse: 82  Temp: (!) 97 F (36.1 C)  TempSrc: Temporal  SpO2: 96%  Weight: 162 lb 6.4 oz (73.7 kg)  Height: $Remove'5\' 9"'OCUfzmb$  (1.753 m)    Estimated body mass index is  23.98 kg/m as calculated from the following:   Height as of this encounter: $RemoveBeforeD'5\' 9"'MbExevvvYbHzzw$  (1.753 m).   Weight as of this encounter: 162 lb 6.4 oz (73.7 kg).  $Rem'@WEIGHTCHANGE'WDly$ @  [employer]   10/12/20 1333  Weight: 162 lb 6.4 oz (73.7 kg)     Physical Exam  General: No distress. Looks well Neuro: Alert and Oriented x 3. GCS 15. Speech normal Psych: Pleasant Resp:  Barrel Chest - no.  Wheeze - no, Crackles - no, No overt respiratory distress CVS: Normal heart sounds. Murmurs - no Ext:  Stigmata of Connective Tissue Disease - no HEENT: Normal upper airway. PEERL +. No post nasal drip        Assessment:       ICD-10-CM   1. Moderate persistent asthma without complication  F02.63 Alpha-1 antitrypsin phenotype       Plan:     Patient Instructions  Moderate persistent asthma without complication  - breathing test wWith severe  fixed lung obstruction  - subjectively better after adding spiriva to breo. Objectively no better - cbc with diff and blood IgE normal - radiologist is reporing mild Upper lobe bronchiectasis as "stable" - first time we are seeing this in report in dec 2021  - 2nd radiologist sent secure email but this is no longer on [facility] - check alpha 1 AT blood test phenotype 10/12/2020  --Continue  albuterol as needed - you do need rescue inhaler - continue breo as before  -continue  Spiriva 2 puff daily if you want OR can hold - will check with radiology about bronchiectasis  - if 2nd one says it is there we have to consider some additional blood work +/- bronchoscopy in fture - can consider 2nd opinion at [facility] if needed - wil hold off for now - repeat spirometry in 6 months  Multiple lung nodules on CT  - largest in 53mmm Right upper lobe -stable march -> july 2019 ->July 2020 -> dec 2020 -. Dec 2021  Plan -no further folowup  Fluid level in esophagus - seeon CT dec 2020   - per your report endoscopy by Dr Beavers  2021 without adversity  Plan   - PPI per GI  Followup 6  months spirometry pre and post BD 6 months or sooner with Dr Gaye Alken    Dr. Brand Males, M.D., F.C.C.P,  Pulmonary and Critical Care Medicine Staff Physician, [place] Director - Interstitial Lung Disease  Program  [facility] [place], [place], [address]  Pager: [phone], If no answer or between  15:00h - 7:00h: call [phone] Telephone: [phone]  2:00 PM 10/12/2020

## 2020-10-12 NOTE — Addendum Note (Signed)
Addended by: Suzzanne Cloud E on: 10/12/2020 02:05 PM   Modules accepted: Orders

## 2020-10-12 NOTE — Patient Instructions (Addendum)
Moderate persistent asthma without complication  - breathing test wWith severe  fixed lung obstruction  - subjectively better after adding spiriva to breo. Objectively no better - cbc with diff and blood IgE normal - radiologist is reporing mild Upper lobe bronchiectasis as "stable" - first time we are seeing this in report in dec 2021  - 2nd radiologist sent secure email but this is no longer on [place] - check alpha 1 AT blood test phenotype 10/12/2020  --Continue  albuterol as needed - you do need rescue inhaler - continue breo as before  -continue  Spiriva 2 puff daily if you want OR can hold - will check with radiology about bronchiectasis  - if 2nd one says it is there we have to consider some additional blood work +/- bronchoscopy in fture - can consider 2nd opinion at [facility] if needed - wil hold off for now - repeat spirometry in 6 months  Multiple lung nodules on CT  - largest in 14mmm Right upper lobe -stable march -> july 2019 ->July 2020 -> dec 2020 -. Dec 2021  Plan -no further folowup  Fluid level in esophagus - seeon CT dec 2020   - per your report endoscopy by Dr Tarri Glenn  2021 without adversity  Plan   - PPI per GI  Followup 6  months spirometry pre and post BD 6 months or sooner with Dr Chase Caller

## 2020-10-22 LAB — ALPHA-1 ANTITRYPSIN PHENOTYPE: A-1 Antitrypsin, Ser: 114 mg/dL (ref 83–199)

## 2020-10-27 ENCOUNTER — Ambulatory Visit: Payer: Medicare PPO | Admitting: Rehabilitative and Restorative Service Providers"

## 2020-10-27 ENCOUNTER — Encounter: Payer: Self-pay | Admitting: Rehabilitative and Restorative Service Providers"

## 2020-10-27 ENCOUNTER — Other Ambulatory Visit: Payer: Self-pay

## 2020-10-27 DIAGNOSIS — M6281 Muscle weakness (generalized): Secondary | ICD-10-CM | POA: Diagnosis not present

## 2020-10-27 DIAGNOSIS — G8929 Other chronic pain: Secondary | ICD-10-CM

## 2020-10-27 DIAGNOSIS — R293 Abnormal posture: Secondary | ICD-10-CM

## 2020-10-27 DIAGNOSIS — M25511 Pain in right shoulder: Secondary | ICD-10-CM

## 2020-10-27 NOTE — Therapy (Signed)
[facility] [place], [place], [address] Phone: [phone]   Fax:  [phone]  Physical Therapy Evaluation  Patient Details  Name: Jasmine Neal MRN: [id number] Date of Birth: 06-26-1946 Referring Provider (PT): Dr. Junius Roads   Encounter Date: 10/27/2020   PT End of Session - 10/27/20 1054    Visit Number 1    Number of Visits 10    Date for PT Re-Evaluation 12/01/20    Authorization Type [employer] MCR    PT Start Time 1010    PT Stop Time 0814    PT Time Calculation (min) 43 min    Activity Tolerance Patient tolerated treatment well;No increased pain    Behavior During Therapy WFL for tasks assessed/performed           Past Medical History:  Diagnosis Date  . Anxiety   . Asthma    evalutated by pulmonary severe by spirometry no smoker currently  . Breast cancer ([place])    hx of right lumpectomy, 1990 IIA radiation cmfp ajunct rx  . Cataract    removed from both eyes  . Chronic airway obstruction, not elsewhere classified   . Coronary artery calcification seen on CAT scan 01/23/2018  . Diverticulosis    only has like 2  . Dysplasia    hx treated with cerv conization  . Fatty liver    US done 2010  . Fibromyalgia   . Hx of nonmelanoma skin cancer     followed dr [place]ique   . Hyperlipidemia   . Hypothyroidism   . Malignant neoplasm of breast (female), unspecified site 11/28/2013  . Osteoarthritis   . Osteopenia   . REM sleep behavior disorder   . Skin cancer    squamous cell    Past Surgical History:  Procedure Laterality Date  . BREAST LUMPECTOMY     on right, 1990 IIA radiation cmfp ajuction rx  . COLONOSCOPY    . KNEE SURGERY  2012  . NASAL SINUS SURGERY  2007   on right done by Dr. Wilburn Cornelia    There were no vitals filed for this visit.    Subjective Assessment - 10/27/20 1013    Subjective R arm hurts and I don't know why. It hurts when I carry my purse. No radicular sxs. No side effects from nitro  patch.    Pertinent History Insidious onset; pt thinks it's from dog turning around in circles on a leash    Limitations House hold activities    How long can you sit comfortably? indeterminate amount of time    How long can you stand comfortably? indeterminate amount of time    How long can you walk comfortably? indeterminate amount of time    Diagnostic tests Diagnostic US R shoulder shows subscap tendon intact, supraspinatus tendon present, infraspinatus tendon normal, potential partial thickness supraspinatus rotator cuff tear bursal surface    Patient Stated Goals to have no pain    Currently in Pain? Yes    Pain Score 3     Pain Location Shoulder    Pain Orientation Right    Pain Descriptors / Indicators Aching;Sharp    Pain Type Chronic pain    Pain Radiating Towards R arm    Pain Onset More than a month ago    Pain Frequency Intermittent    Aggravating Factors  driving, carrying purse    Pain Relieving Factors rest, using L UE instead    Effect of Pain on Daily Activities does not  use arm if possible    Multiple Pain Sites No              [facility] PT Assessment - 10/27/20 0001      Assessment   Medical Diagnosis Potential R partial thickness supraspinatus rotator cuff tear    Referring Provider (PT) Dr. Junius Roads    Onset Date/Surgical Date 09/16/20    Hand Dominance Right    Next MD Visit unknown    Prior Therapy none      Precautions   Precautions None      Restrictions   Weight Bearing Restrictions No      Balance Screen   Has the patient fallen in the past 6 months No      [place] residence      Prior Function   Level of Independence Independent      Cognition   Overall Cognitive Status Within Functional Limits for tasks assessed      Observation/Other Assessments   Focus on Therapeutic Outcomes (FOTO)  60%      Sensation   Light Touch Appears Intact      Coordination   Gross Motor Movements are Fluid and  Coordinated Yes      Posture/Postural Control   Posture Comments R AC joint lower, R scapula lower, R AC joint more prominent, rounded shoulders, thoracic kyphosis,      ROM / Strength   AROM / PROM / Strength AROM;PROM;Strength      AROM   Overall AROM Comments L shoulder IR T7, ER T3, R shoulder IR L3 and ER T2. R shoulder flexion 120, abdct 86 seated. L shoulder AROM WFL      PROM   Overall PROM Comments PROM WFL without pain when pt relaxes      Strength   Overall Strength Comments L shoulder 4/5 all except IR 4/5, elbow ext 3+/5. R shoulder flex/abdct 3+/5, elbow flex 4-/5, IR 3+/5, ER 2-/5      Palpation   Palpation comment decreased pain with humeral posterior pressure with PROM, R AC joint lower and more prominent, R scapula lower      Special Tests   Other special tests [name] +, empty can +, full can positive +, neers +, Yergasons -, Lift off +                      Objective measurements completed on examination: See above findings.               PT Education - 10/27/20 1647    Education Details HEP: R scapular retraction, R scapular depression 7x/week 1-2x/day, 10 reps. Advised pt she can do in front of mirror for visual feedback if needed. Discussed no compensatory muscle usage to perform motion. Discussed importance of posture, anatomy, and how posture is important for decreasing further injury and improvement in pain.    Person(s) Educated Patient    Methods Explanation;Demonstration;Handout    Comprehension Verbalized understanding;Returned demonstration               PT Long Term Goals - 10/27/20 1650      PT LONG TERM GOAL #1   Title Pt will be indep with HEP to assist with pain  management R shoulder    Baseline initial issued at eval    Time 5    Period Weeks    Status New    Target Date 12/01/20  PT LONG TERM GOAL #2   Title Pt will be able to drive with 61% less difficulty with R UE    Baseline unable  without pain    Time 5    Period Weeks    Status New    Target Date 12/01/20      PT LONG TERM GOAL #3   Title Pt will be able to carry her purse with her right UE with </= 2/10 R shoulder pain    Baseline unable    Time 5    Period Weeks    Status New    Target Date 12/01/20      PT LONG TERM GOAL #4   Title Pt will demo improved R shoulder strength >/= 1 MMT grade to perform gardening activities    Baseline R shoulder flex/abdct 3+/5, elbow flex 4-/5, IR 3+/5, ER 2-/5    Time 5    Period Weeks    Status New    Target Date 12/01/20      PT LONG TERM GOAL #5   Title Pt will demo improved R shoulder flexion to 160 degrees and abduction to 120 degrees to assist with improved IADLs    Baseline flexion AROm 120, abdct 86    Time 5    Period Weeks    Status New    Target Date 12/01/20                  Plan - 10/27/20 1055    Clinical Impression Statement Pt presents to PT with R arm + impingement with potential partial thickness supraspinatus rotator cuff tear bursal surface. Pt demonstrates decreased R shoulder AROM  and decreased strength. Pt with poor posture; she denies radicular symptoms. Pt would benefit from further PT for R shoulder strengthening with emphasis on proper scapular muscle activation, ROM therex, modalites and manual therapy as needed. Impingement tests +. Pt with reduced pain with humeral retraction prior to movement.    Personal Factors and Comorbidities Comorbidity 1;Comorbidity 2;Comorbidity 3+    Comorbidities osteopenia, history of breast cancer, potential partial thickness supraspinatus rotator cuff tear bursal surface, hx of skin cancer    Examination-Activity Limitations Reach Overhead;Other   driving, gardening, walking dog, carrying purse   Examination-Participation Restrictions Cleaning;Community Activity;Yard Work    Stability/Clinical Decision Making Stable/Uncomplicated    Clinical Decision Making Low    Rehab Potential Good    PT  Frequency 2x / week    PT Duration --   5 weeks   PT Treatment/Interventions Cryotherapy;Ultrasound;Moist Heat;Iontophoresis 4mg /ml Dexamethasone;Electrical Stimulation;Functional mobility training;Neuromuscular re-education;Therapeutic exercise;Therapeutic activities;Patient/family education;Manual techniques;Passive range of motion;Dry needling;Taping    PT Next Visit Plan Review HEP; work on scapular kinematics R    PT Home Exercise Plan R scapular retraction, R scapular depression 7x/week 1-2x/day, 10 reps. Advised pt she can do in front of mirror for visual feedback if needed. Discussed no compensatory muscle usage to perform motion.    Consulted and Agree with Plan of Care Patient           Patient will benefit from skilled therapeutic intervention in order to improve the following deficits and impairments:  Decreased endurance,Decreased mobility,Decreased range of motion,Decreased activity tolerance,Decreased strength,Impaired flexibility,Impaired UE functional use,Postural dysfunction,Pain  Visit Diagnosis: Chronic right shoulder pain  Muscle weakness (generalized)  Abnormal posture     Problem List Patient Active Problem List   Diagnosis Date Noted  . REM sleep behavior disorder 11/03/2019  . Complaint related to dreams 04/22/2019  .  Snoring 04/22/2019  . 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 Urinary frequency 03/06/2018  . Coronary artery calcification seen on CAT scan 01/23/2018  . SOB (shortness of breath) 01/23/2018  . Thyroid activity decreased 10/11/2014  . Family history of malignant neoplasm of breast 11/28/2013  . Family history of breast cancer in first degree relative 10/09/2013  . Visit for preventive health examination 10/09/2013  . COPD, moderate ([place]) 09/11/2012  . Medication management 09/11/2012  . Hx of nonmelanoma skin cancer   . Encounter for Medicare annual wellness exam 08/27/2011  . Recurrent cold sores 06/13/2011  . Imbalance 03/02/2011  .  OSTEOPENIA 05/05/2010  . KNEE PAIN, LEFT 01/21/2010  . TOBACCO USE, QUIT 04/16/2009  . ADVERSE REACTION TO MEDICATION 01/05/2009  . BACK PAIN WITH RADICULOPATHY 06/23/2008  . NONSPEC ELEVATION OF LEVELS OF TRANSAMINASE/LDH 08/27/2007  . REACTIVE AIRWAY DISEASE 06/30/2007  . Hypothyroidism 03/26/2007  . Hyperlipidemia 03/26/2007  . ANXIETY 03/26/2007  . COPD 03/26/2007  . OSTEOARTHRITIS 03/26/2007  . LOW BACK PAIN 03/26/2007  . BACK PAIN, LUMBAR, WITH RADICULOPATHY 03/26/2007  . BREAST CANCER, HX OF 03/26/2007    America Brown, PT, DPT 10/27/2020, 4:57 PM  [facility] [address] [place], [place], [address] Phone: [phone]   Fax:  [phone]  Name: Jasmine Neal MRN: [id number] Date of Birth: 1945-08-15

## 2020-11-10 ENCOUNTER — Other Ambulatory Visit: Payer: Self-pay

## 2020-11-10 ENCOUNTER — Encounter: Payer: Self-pay | Admitting: Physical Therapy

## 2020-11-10 ENCOUNTER — Ambulatory Visit: Payer: Medicare PPO | Admitting: Physical Therapy

## 2020-11-10 DIAGNOSIS — G8929 Other chronic pain: Secondary | ICD-10-CM

## 2020-11-10 DIAGNOSIS — M6281 Muscle weakness (generalized): Secondary | ICD-10-CM | POA: Diagnosis not present

## 2020-11-10 DIAGNOSIS — M25511 Pain in right shoulder: Secondary | ICD-10-CM | POA: Diagnosis not present

## 2020-11-10 DIAGNOSIS — R293 Abnormal posture: Secondary | ICD-10-CM | POA: Diagnosis not present

## 2020-11-10 NOTE — Patient Instructions (Signed)
Access Code: [email or web address] URL: [email or web address] Date: 11/10/2020 Prepared by: Jeral Pinch  Exercises Supine shoulder flexion with band - 1 x daily - 2-3 sets - 10 reps Supine Shoulder Horizontal Abduction with Resistance - 1 x daily - 2-3 sets - 10 reps Supine Shoulder External Rotation with Resistance - 1 x daily - 2-3 sets - 10 reps Shoulder Flexion Serratus Activation with Resistance - 1 x daily - 1-2 sets - 10 reps Doorway Pec Stretch at 60 Elevation - 1 x daily - 1 reps - 30 sec hold

## 2020-11-10 NOTE — Therapy (Signed)
[facility], [place], [address] Phone: [phone]   Fax:  [phone]  Physical Therapy Treatment  Patient Details  Name: Jasmine Neal MRN: [id number] Date of Birth: March 06, 1946 Referring Provider (PT): Dr. Junius Roads   Encounter Date: 11/10/2020   PT End of Session - 11/10/20 0931    Visit Number 2    Number of Visits 10    Date for PT Re-Evaluation 12/01/20    Authorization Type [employer] MCR    PT Start Time 0931    PT Stop Time 1010   ice at end of session   PT Time Calculation (min) 39 min    Activity Tolerance Patient tolerated treatment well;Patient limited by fatigue    Behavior During Therapy [facility] for tasks assessed/performed           Past Medical History:  Diagnosis Date  . Anxiety   . Asthma    evalutated by pulmonary severe by spirometry no smoker currently  . Breast cancer ([place])    hx of right lumpectomy, 1990 IIA radiation cmfp ajunct rx  . Cataract    removed from both eyes  . Chronic airway obstruction, not elsewhere classified   . Coronary artery calcification seen on CAT scan 01/23/2018  . Diverticulosis    only has like 2  . Dysplasia    hx treated with cerv conization  . Fatty liver    US done 2010  . Fibromyalgia   . Hx of nonmelanoma skin cancer     followed dr Martinique   . Hyperlipidemia   . Hypothyroidism   . Malignant neoplasm of breast (female), unspecified site 11/28/2013  . Osteoarthritis   . Osteopenia   . REM sleep behavior disorder   . Skin cancer    squamous cell    Past Surgical History:  Procedure Laterality Date  . BREAST LUMPECTOMY     on right, 1990 IIA radiation cmfp ajuction rx  . COLONOSCOPY    . KNEE SURGERY  2012  . NASAL SINUS SURGERY  2007   on right done by Dr. Wilburn Cornelia    There were no vitals filed for this visit.   Subjective Assessment - 11/10/20 0931    Subjective Pt reports she was on vacation and wasn't able to perform her exercise as much. She  hasn't noticed any changes    Patient Stated Goals to have no pain    Currently in Pain? No/denies                             [facility] Adult PT Treatment/Exercise - 11/10/20 0001      Exercises   Exercises Shoulder      Shoulder Exercises: Supine   Horizontal ABduction Strengthening;Both;20 reps;Theraband    Theraband Level (Shoulder Horizontal ABduction) Level 2 (Red)    External Rotation Strengthening;Both;20 reps;Theraband    Theraband Level (Shoulder External Rotation) Level 2 (Red)    Flexion Strengthening;Both;20 reps;Theraband   overhead pull   Theraband Level (Shoulder Flexion) Level 2 (Red)    Other Supine Exercises thoracic mobilization over half bolster with 3 pillows under head      Shoulder Exercises: ROM/Strengthening   UBE (Upper Arm Bike) L1x4' alt FWD/BWD      Shoulder Exercises: Stretch   Other Shoulder Stretches doorway stretch low      Modalities   Modalities Cryotherapy      Cryotherapy   Number Minutes Cryotherapy 10 Minutes  Cryotherapy Location Shoulder   rt   Type of Cryotherapy Ice pack                       PT Long Term Goals - 10/27/20 1650      PT LONG TERM GOAL #1   Title Pt will be indep with HEP to assist with pain  management R shoulder    Baseline initial issued at eval    Time 5    Period Weeks    Status New    Target Date 12/01/20      PT LONG TERM GOAL #2   Title Pt will be able to drive with 01% less difficulty with R UE    Baseline unable without pain    Time 5    Period Weeks    Status New    Target Date 12/01/20      PT LONG TERM GOAL #3   Title Pt will be able to carry her purse with her right UE with </= 2/10 R shoulder pain    Baseline unable    Time 5    Period Weeks    Status New    Target Date 12/01/20      PT LONG TERM GOAL #4   Title Pt will demo improved R shoulder strength >/= 1 MMT grade to perform gardening activities    Baseline R shoulder flex/abdct 3+/5, elbow flex  4-/5, IR 3+/5, ER 2-/5    Time 5    Period Weeks    Status New    Target Date 12/01/20      PT LONG TERM GOAL #5   Title Pt will demo improved R shoulder flexion to 160 degrees and abduction to 120 degrees to assist with improved IADLs    Baseline flexion AROm 120, abdct 86    Time 5    Period Weeks    Status New    Target Date 12/01/20                 Plan - 11/10/20 1005    Clinical Impression Statement This is pts first visit since eval, she was out of town for a couple of weeks.  Reports not seeing a lot of changes with initial HEP, resistance ex added for RTC and scapular stabilization.  She did develop some Rt shoulder pain at end of session so ice was applied to settle this down.  She still has limited Rt shoulder ROM and weakness.  Pain is intermittent.  No goals met at this time, would benefit from continued tx to restore function and reduce pain    Rehab Potential Good    PT Frequency 2x / week    PT Treatment/Interventions Cryotherapy;Ultrasound;Moist Heat;Iontophoresis 4mg /ml Dexamethasone;Electrical Stimulation;Functional mobility training;Neuromuscular re-education;Therapeutic exercise;Therapeutic activities;Patient/family education;Manual techniques;Passive range of motion;Dry needling;Taping    PT Next Visit Plan assess response to new HEP and progress scapular stability and RTC strength Rt    PT Home Exercise Plan [id number]    Consulted and Agree with Plan of Care Patient           Patient will benefit from skilled therapeutic intervention in order to improve the following deficits and impairments:  Decreased endurance,Decreased mobility,Decreased range of motion,Decreased activity tolerance,Decreased strength,Impaired flexibility,Impaired UE functional use,Postural dysfunction,Pain  Visit Diagnosis: Chronic right shoulder pain  Muscle weakness (generalized)  Abnormal posture     Problem List Patient Active Problem List   Diagnosis Date Noted  . REM  sleep  behavior disorder 11/03/2019  . Complaint related to dreams 04/22/2019  . Snoring 04/22/2019  . 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 Urinary frequency 03/06/2018  . Coronary artery calcification seen on CAT scan 01/23/2018  . SOB (shortness of breath) 01/23/2018  . Thyroid activity decreased 10/11/2014  . Family history of malignant neoplasm of breast 11/28/2013  . Family history of breast cancer in first degree relative 10/09/2013  . Visit for preventive health examination 10/09/2013  . COPD, moderate ([place]) 09/11/2012  . Medication management 09/11/2012  . Hx of nonmelanoma skin cancer   . Encounter for Medicare annual wellness exam 08/27/2011  . Recurrent cold sores 06/13/2011  . Imbalance 03/02/2011  . OSTEOPENIA 05/05/2010  . KNEE PAIN, LEFT 01/21/2010  . TOBACCO USE, QUIT 04/16/2009  . ADVERSE REACTION TO MEDICATION 01/05/2009  . BACK PAIN WITH RADICULOPATHY 06/23/2008  . NONSPEC ELEVATION OF LEVELS OF TRANSAMINASE/LDH 08/27/2007  . REACTIVE AIRWAY DISEASE 06/30/2007  . Hypothyroidism 03/26/2007  . Hyperlipidemia 03/26/2007  . ANXIETY 03/26/2007  . COPD 03/26/2007  . OSTEOARTHRITIS 03/26/2007  . LOW BACK PAIN 03/26/2007  . BACK PAIN, LUMBAR, WITH RADICULOPATHY 03/26/2007  . BREAST CANCER, HX OF 03/26/2007    Jeral Pinch PT  11/10/2020, 10:09 AM  [facility] [address] [place], [place], [address] Phone: [phone]   Fax:  [phone]  Name: Jasmine Neal MRN: [id number] Date of Birth: 21-Sep-1945

## 2020-11-12 ENCOUNTER — Encounter: Payer: Medicare PPO | Admitting: Rehabilitative and Restorative Service Providers"

## 2020-11-16 ENCOUNTER — Ambulatory Visit: Payer: Medicare PPO | Attending: Internal Medicine

## 2020-11-16 DIAGNOSIS — Z20822 Contact with and (suspected) exposure to covid-19: Secondary | ICD-10-CM | POA: Diagnosis not present

## 2020-11-17 ENCOUNTER — Ambulatory Visit: Payer: Medicare PPO | Admitting: Physical Therapy

## 2020-11-17 ENCOUNTER — Other Ambulatory Visit: Payer: Self-pay

## 2020-11-17 ENCOUNTER — Encounter: Payer: Self-pay | Admitting: Physical Therapy

## 2020-11-17 DIAGNOSIS — R293 Abnormal posture: Secondary | ICD-10-CM

## 2020-11-17 DIAGNOSIS — G8929 Other chronic pain: Secondary | ICD-10-CM | POA: Diagnosis not present

## 2020-11-17 DIAGNOSIS — M6281 Muscle weakness (generalized): Secondary | ICD-10-CM

## 2020-11-17 DIAGNOSIS — M25511 Pain in right shoulder: Secondary | ICD-10-CM

## 2020-11-17 LAB — NOVEL CORONAVIRUS, NAA: SARS-CoV-2, NAA: NOT DETECTED

## 2020-11-17 LAB — SARS-COV-2, NAA 2 DAY TAT

## 2020-11-17 NOTE — Therapy (Signed)
[facility] [address] [employer] [place], [place], [address] Phone: [phone]   Fax:  [phone]  Physical Therapy Treatment  Patient Details  Name: Jasmine Neal MRN: [id number] Date of Birth: 11-14-1945 Referring Provider (PT): Dr. Junius Roads   Encounter Date: 11/17/2020   PT End of Session - 11/17/20 1045    Visit Number 3    Number of Visits 10    Date for PT Re-Evaluation 12/01/20    Authorization Type [employer] MCR    PT Start Time 1020    PT Stop Time 1059    PT Time Calculation (min) 39 min    Activity Tolerance Patient tolerated treatment well;Patient limited by fatigue    Behavior During Therapy [facility] for tasks assessed/performed           Past Medical History:  Diagnosis Date  . Anxiety   . Asthma    evalutated by pulmonary severe by spirometry no smoker currently  . Breast cancer ([place])    hx of right lumpectomy, 1990 IIA radiation cmfp ajunct rx  . Cataract    removed from both eyes  . Chronic airway obstruction, not elsewhere classified   . Coronary artery calcification seen on CAT scan 01/23/2018  . Diverticulosis    only has like 2  . Dysplasia    hx treated with cerv conization  . Fatty liver    US done 2010  . Fibromyalgia   . Hx of nonmelanoma skin cancer     followed dr [place]ique   . Hyperlipidemia   . Hypothyroidism   . Malignant neoplasm of breast (female), unspecified site 11/28/2013  . Osteoarthritis   . Osteopenia   . REM sleep behavior disorder   . Skin cancer    squamous cell    Past Surgical History:  Procedure Laterality Date  . BREAST LUMPECTOMY     on right, 1990 IIA radiation cmfp ajuction rx  . COLONOSCOPY    . KNEE SURGERY  2012  . NASAL SINUS SURGERY  2007   on right done by Dr. Wilburn Cornelia    There were no vitals filed for this visit.   Subjective Assessment - 11/17/20 1042    Subjective Pt arriving repoting 2/10 pain in right shoulder. Pt reporting feeling sick last week and she hasn't been  doing her exercises.    Pertinent History Insidious onset; pt thinks it's from dog turning around in circles on a leash    Limitations House hold activities    How long can you stand comfortably? indeterminate amount of time    How long can you walk comfortably? indeterminate amount of time    Diagnostic tests Diagnostic US R shoulder shows subscap tendon intact, supraspinatus tendon present, infraspinatus tendon normal, potential partial thickness supraspinatus rotator cuff tear bursal surface    Patient Stated Goals to have no pain    Currently in Pain? Yes    Pain Score 2     Pain Location Shoulder    Pain Orientation Right    Pain Descriptors / Indicators Aching;Sore    Pain Type Chronic pain    Pain Onset More than a month ago                             [facility] Adult PT Treatment/Exercise - 11/17/20 0001      Shoulder Exercises: Standing   External Rotation Strengthening;Right;15 reps;Theraband    Theraband Level (Shoulder External Rotation) Level 2 (Red)  Internal Rotation Strengthening;Right;15 reps    Extension Strengthening;Both;15 reps;Theraband    Theraband Level (Shoulder Extension) Level 2 (Red)    Row Both;Strengthening;20 reps    Theraband Level (Shoulder Row) Level 2 (Red)    Other Standing Exercises wall push ups x 10 (1/2 distance)      Shoulder Exercises: ROM/Strengthening   UBE (Upper Arm Bike) L1.5 x 6 minutes alt FWD/BWD      Shoulder Exercises: Stretch   Other Shoulder Stretches upper trap stretch, cervical rotation, levator stretch all x 3 to each side holding 10 seconds each    Other Shoulder Stretches doorway stretch low, hands at shoulder height x 3 holding 20 seconds, hands above shoulder height holding 20 seconds                       PT Long Term Goals - 11/17/20 1049      PT LONG TERM GOAL #1   Title Pt will be indep with HEP to assist with pain  management R shoulder    Status On-going      PT LONG TERM GOAL  #2   Title Pt will be able to drive with 84% less difficulty with R UE    Status On-going      PT LONG TERM GOAL #3   Title Pt will be able to carry her purse with her right UE with </= 2/10 R shoulder pain    Status On-going      PT LONG TERM GOAL #4   Title Pt will demo improved R shoulder strength >/= 1 MMT grade to perform gardening activities    Status On-going      PT LONG TERM GOAL #5   Title Pt will demo improved R shoulder flexion to 160 degrees and abduction to 120 degrees to assist with improved IADLs    Status On-going                 Plan - 11/17/20 1047    Clinical Impression Statement Pt tolerating exercises well. Pt was sick last week and wasn't able to do her HEP. Pt reporting 2/10 pain today. Progressing with shoulder strengtheing and scapular stabilization. Cervical stretching also performed at end of session. Continue skilled PT to maximize function.    Personal Factors and Comorbidities Comorbidity 1;Comorbidity 2;Comorbidity 3+    Comorbidities osteopenia, history of breast cancer, potential partial thickness supraspinatus rotator cuff tear bursal surface, hx of skin cancer    Examination-Activity Limitations Reach Overhead;Other    Examination-Participation Restrictions Cleaning;Community Activity;Yard Work    Stability/Clinical Decision Making Stable/Uncomplicated    Rehab Potential Good    PT Frequency 2x / week    PT Treatment/Interventions Cryotherapy;Ultrasound;Moist Heat;Iontophoresis 4mg /ml Dexamethasone;Electrical Stimulation;Functional mobility training;Neuromuscular re-education;Therapeutic exercise;Therapeutic activities;Patient/family education;Manual techniques;Passive range of motion;Dry needling;Taping    PT Next Visit Plan assess response to new HEP and progress scapular stability and RTC strength Rt    PT Home Exercise Plan [id number]    Consulted and Agree with Plan of Care Patient           Patient will benefit from skilled  therapeutic intervention in order to improve the following deficits and impairments:  Decreased endurance,Decreased mobility,Decreased range of motion,Decreased activity tolerance,Decreased strength,Impaired flexibility,Impaired UE functional use,Postural dysfunction,Pain  Visit Diagnosis: Chronic right shoulder pain  Muscle weakness (generalized)  Abnormal posture     Problem List Patient Active Problem List   Diagnosis Date Noted  . REM sleep behavior disorder 11/03/2019  .  Complaint related to dreams 04/22/2019  . Snoring 04/22/2019  . 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 Urinary frequency 03/06/2018  . Coronary artery calcification seen on CAT scan 01/23/2018  . SOB (shortness of breath) 01/23/2018  . Thyroid activity decreased 10/11/2014  . Family history of malignant neoplasm of breast 11/28/2013  . Family history of breast cancer in first degree relative 10/09/2013  . Visit for preventive health examination 10/09/2013  . COPD, moderate ([place]) 09/11/2012  . Medication management 09/11/2012  . Hx of nonmelanoma skin cancer   . Encounter for Medicare annual wellness exam 08/27/2011  . Recurrent cold sores 06/13/2011  . Imbalance 03/02/2011  . OSTEOPENIA 05/05/2010  . KNEE PAIN, LEFT 01/21/2010  . TOBACCO USE, QUIT 04/16/2009  . ADVERSE REACTION TO MEDICATION 01/05/2009  . BACK PAIN WITH RADICULOPATHY 06/23/2008  . NONSPEC ELEVATION OF LEVELS OF TRANSAMINASE/LDH 08/27/2007  . REACTIVE AIRWAY DISEASE 06/30/2007  . Hypothyroidism 03/26/2007  . Hyperlipidemia 03/26/2007  . ANXIETY 03/26/2007  . COPD 03/26/2007  . OSTEOARTHRITIS 03/26/2007  . LOW BACK PAIN 03/26/2007  . BACK PAIN, LUMBAR, WITH RADICULOPATHY 03/26/2007  . BREAST CANCER, HX OF 03/26/2007    Oretha Caprice , PT, MPT 11/17/2020, 10:53 AM  [facility] [address] [place], [place], [address] Phone: [phone]   Fax:  [phone]  Name: Jasmine Neal MRN:  [id number] Date of Birth: 01-Sep-1945

## 2020-11-18 NOTE — Progress Notes (Signed)
   I, Wendy Poet, LAT, ATC, am serving as scribe for Dr. Lynne Leader.  Jasmine Neal is a 75 y.o. female who presents to [place] at [facility] today for f/u of R shoulder and arm pain that began after playing w/ her pit bull.  She was last seen by Dr. Georgina Snell on 10/08/20 and was referred to PT of which she has completed 3 sessions.  She was also prescribed nitroglycerin patches.  Since her last visit, pt reports pain is almost completely gone. Pt notes slight lost of AROM w/ IR and ER.   Diagnostic imaging: R shoulder XR- 10/08/20   Pertinent review of systems: No fevers or chills  Relevant historical information: COPD   Exam:  BP 132/88 (BP Location: Left Arm, Patient Position: Sitting, Cuff Size: Normal)   Ht 5\' 9"  (1.753 m)   Wt 159 lb 9.6 oz (72.4 kg)   BMI 23.57 kg/m  General: Well Developed, well nourished, and in no acute distress.   MSK: Right shoulder normal. Nontender. Range of motion normal abduction and external rotation limited internal rotation. Intact strength minimally positive Hawkins and Neer's test.    Lab and Radiology Results  EXAM: RIGHT SHOULDER - 2+ VIEW  COMPARISON:  None.  FINDINGS: Clips in the right axilla. No fracture or malalignment. Mild AC joint degenerative change  IMPRESSION: No acute osseous abnormality.  AC joint degenerative change   Electronically Signed   By: Donavan Foil M.D.   On: 10/08/2020 21:21 I, Lynne Leader, personally (independently) visualized and performed the interpretation of the images attached in this note.     Assessment and Plan: 75 y.o. female with right shoulder pain.  Significant improvement with PT and home exercise program and nitroglycerin patch protocol.  Plan to finish out total 14-month course of nitroglycerin patch protocol while completing PT and continuing home exercise program.  Recheck back with me as needed.    Discussed warning signs or symptoms. Please see  discharge instructions. Patient expresses understanding.   The above documentation has been reviewed and is accurate and complete Lynne Leader, M.D.

## 2020-11-19 ENCOUNTER — Other Ambulatory Visit: Payer: Self-pay

## 2020-11-19 ENCOUNTER — Ambulatory Visit: Payer: Medicare PPO | Admitting: Family Medicine

## 2020-11-19 ENCOUNTER — Ambulatory Visit: Payer: Medicare PPO | Admitting: Physical Therapy

## 2020-11-19 VITALS — BP 132/88 | Ht 69.0 in | Wt 159.6 lb

## 2020-11-19 DIAGNOSIS — M25511 Pain in right shoulder: Secondary | ICD-10-CM

## 2020-11-19 DIAGNOSIS — M79601 Pain in right arm: Secondary | ICD-10-CM

## 2020-11-19 DIAGNOSIS — G8929 Other chronic pain: Secondary | ICD-10-CM

## 2020-11-19 DIAGNOSIS — M6281 Muscle weakness (generalized): Secondary | ICD-10-CM

## 2020-11-19 DIAGNOSIS — R293 Abnormal posture: Secondary | ICD-10-CM | POA: Diagnosis not present

## 2020-11-19 NOTE — Patient Instructions (Signed)
Thank you for coming in today.  Finish up PT and continue home exercises.   Complete course of nitroglycerine patches about total of 3 months (or when the box runs out which ever is first).  Return as needed.

## 2020-11-19 NOTE — Therapy (Signed)
[facility] Physical Therapy [address] [place], [place], [address] Phone: [phone]   Fax:  [phone]  Physical Therapy Treatment/Discharge PHYSICAL THERAPY DISCHARGE SUMMARY  Visits from Start of Care: 4  Current functional level related to goals / functional outcomes: See below   Remaining deficits: See below   Education / Equipment: HEP  Plan: Patient agrees to discharge.  Patient goals were met. Patient is being discharged due to being pleased with the current functional level.  ?????       Patient Details  Name: Jasmine Neal MRN: [id number] Date of Birth: 1945-11-07 Referring Provider (PT): Dr. Junius Roads   Encounter Date: 11/19/2020   PT End of Session - 11/19/20 1225    Visit Number 4    Number of Visits 10    Date for PT Re-Evaluation 12/01/20    Authorization Type [employer] MCR    PT Start Time 5597    PT Stop Time 1219    PT Time Calculation (min) 38 min    Activity Tolerance Patient tolerated treatment well;Patient limited by fatigue    Behavior During Therapy [facility] for tasks assessed/performed           Past Medical History:  Diagnosis Date  . Anxiety   . Asthma    evalutated by pulmonary severe by spirometry no smoker currently  . Breast cancer ([place])    hx of right lumpectomy, 1990 IIA radiation cmfp ajunct rx  . Cataract    removed from both eyes  . Chronic airway obstruction, not elsewhere classified   . Coronary artery calcification seen on CAT scan 01/23/2018  . Diverticulosis    only has like 2  . Dysplasia    hx treated with cerv conization  . Fatty liver    US done 2010  . Fibromyalgia   . Hx of nonmelanoma skin cancer     followed dr [place]   . Hyperlipidemia   . Hypothyroidism   . Malignant neoplasm of breast (female), unspecified site 11/28/2013  . Osteoarthritis   . Osteopenia   . REM sleep behavior disorder   . Skin cancer    squamous cell    Past Surgical History:  Procedure Laterality Date   . BREAST LUMPECTOMY     on right, 1990 IIA radiation cmfp ajuction rx  . COLONOSCOPY    . KNEE SURGERY  2012  . NASAL SINUS SURGERY  2007   on right done by Dr. Wilburn Cornelia    There were no vitals filed for this visit.   Subjective Assessment - 11/19/20 1222    Subjective relays her shoulder is excellent, she was released by MD,she is ready to discharge today    Pertinent History Insidious onset; pt thinks it's from dog turning around in circles on a leash    Limitations House hold activities    How long can you sit comfortably? indeterminate amount of time    How long can you stand comfortably? indeterminate amount of time    How long can you walk comfortably? indeterminate amount of time    Diagnostic tests Diagnostic US R shoulder shows subscap tendon intact, supraspinatus tendon present, infraspinatus tendon normal, potential partial thickness supraspinatus rotator cuff tear bursal surface    Patient Stated Goals to have no pain    Pain Onset More than a month ago              [facility] PT Assessment - 11/19/20 0001      Assessment   Medical Diagnosis  Potential R partial thickness supraspinatus rotator cuff tear    Referring Provider (PT) Dr. Junius Roads      Observation/Other Assessments   Focus on Therapeutic Outcomes (FOTO)  69%, met goal for this      AROM   Overall AROM Comments Rt shoulder ROM WFL      Strength   Overall Strength Comments Lt shoulder strength WFL                         [facility] Adult PT Treatment/Exercise - 11/19/20 0001      Shoulder Exercises: Seated   External Rotation Both    Theraband Level (Shoulder External Rotation) Level 2 (Red)    External Rotation Limitations 2 sets of 10      Shoulder Exercises: Prone   Other Prone Exercises Rt shoulder Y,T,I 2 sets of 10      Shoulder Exercises: Standing   Other Standing Exercises D1 and D2 flexion red band 2 X10      Shoulder Exercises: ROM/Strengthening   UBE (Upper Arm Bike) L2 x 6  minutes alt FWD/BWD                  PT Education - 11/19/20 1224    Education Details HEP progresion    Person(s) Educated Patient    Methods Explanation;Demonstration;Verbal cues;Handout    Comprehension Verbalized understanding               PT Long Term Goals - 11/19/20 1226      PT LONG TERM GOAL #1   Title Pt will be indep with HEP to assist with pain  management R shoulder    Time 5    Period Weeks    Status Achieved      PT LONG TERM GOAL #2   Title Pt will be able to drive with 54% less difficulty with R UE    Time 5    Period Weeks    Status Achieved      PT LONG TERM GOAL #3   Title Pt will be able to carry her purse with her right UE with </= 2/10 R shoulder pain    Time 5    Period Weeks    Status Achieved      PT LONG TERM GOAL #4   Title Pt will demo improved R shoulder strength >/= 1 MMT grade to perform gardening activities    Time 5    Period Weeks    Status Achieved      PT LONG TERM GOAL #5   Title Pt will demo improved R shoulder flexion to 160 degrees and abduction to 120 degrees to assist with improved IADLs    Time 5    Period Weeks    Status Achieved                 Plan - 11/19/20 1227    Clinical Impression Statement Her shoulder is doing great and she has no compaints. MD has released her from PT so we will discharge today and gave her additional HEP progression that she showed good understanding of. She has met all PT goals.    Personal Factors and Comorbidities Comorbidity 1;Comorbidity 2;Comorbidity 3+    Comorbidities osteopenia, history of breast cancer, potential partial thickness supraspinatus rotator cuff tear bursal surface, hx of skin cancer    Examination-Activity Limitations Reach Overhead;Other    Examination-Participation Restrictions Cleaning;Community Activity;Yard Work    [employer]chant [employer] officer  Stable/Uncomplicated    Rehab Potential Good    PT Frequency 2x / week    PT  Treatment/Interventions Cryotherapy;Ultrasound;Moist Heat;Iontophoresis 25m/ml Dexamethasone;Electrical Stimulation;Functional mobility training;Neuromuscular re-education;Therapeutic exercise;Therapeutic activities;Patient/family education;Manual techniques;Passive range of motion;Dry needling;Taping    PT Next Visit Plan DC today    PT [place] Agree with Plan of Care Patient           Patient will benefit from skilled therapeutic intervention in order to improve the following deficits and impairments:  Decreased endurance,Decreased mobility,Decreased range of motion,Decreased activity tolerance,Decreased strength,Impaired flexibility,Impaired UE functional use,Postural dysfunction,Pain  Visit Diagnosis: Chronic right shoulder pain  Muscle weakness (generalized)  Abnormal posture     Problem List Patient Active Problem List   Diagnosis Date Noted  . REM sleep behavior disorder 11/03/2019  . Complaint related to dreams 04/22/2019  . Snoring 04/22/2019  . 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 Urinary frequency 03/06/2018  . Coronary artery calcification seen on CAT scan 01/23/2018  . SOB (shortness of breath) 01/23/2018  . Thyroid activity decreased 10/11/2014  . Family history of malignant neoplasm of breast 11/28/2013  . Family history of breast cancer in first degree relative 10/09/2013  . Visit for preventive health examination 10/09/2013  . COPD, moderate ([place] 09/11/2012  . Medication management 09/11/2012  . Hx of nonmelanoma skin cancer   . Encounter for Medicare annual wellness exam 08/27/2011  . Recurrent cold sores 06/13/2011  . Imbalance 03/02/2011  . OSTEOPENIA 05/05/2010  . KNEE PAIN, LEFT 01/21/2010  . TOBACCO USE, QUIT 04/16/2009  . ADVERSE REACTION TO MEDICATION 01/05/2009  . BACK PAIN WITH RADICULOPATHY 06/23/2008  . NONSPEC ELEVATION OF LEVELS OF TRANSAMINASE/LDH 08/27/2007  . REACTIVE AIRWAY DISEASE 06/30/2007  .  Hypothyroidism 03/26/2007  . Hyperlipidemia 03/26/2007  . ANXIETY 03/26/2007  . COPD 03/26/2007  . OSTEOARTHRITIS 03/26/2007  . LOW BACK PAIN 03/26/2007  . BACK PAIN, LUMBAR, WITH RADICULOPATHY 03/26/2007  . BREAST CANCER, HX OF 03/26/2007    BSilvestre Mesi5/06/2021, 12:29 PM  [facility] [address] [address]Phone: [phone]  Fax:  [phone] Name: DAleesa SweigertMRN: [id number] of Birth: [birth year]

## 2020-11-19 NOTE — Patient Instructions (Signed)
Access Code: [email or web address] URL: [email or web address] Date: 11/19/2020 Prepared by: Elsie Ra  Exercises Supine shoulder flexion with band - 1 x daily - 2-3 sets - 10 reps Supine Shoulder Horizontal Abduction with Resistance - 1 x daily - 2-3 sets - 10 reps Supine Shoulder External Rotation with Resistance - 1 x daily - 2-3 sets - 10 reps Shoulder Flexion Serratus Activation with Resistance - 1 x daily - 1-2 sets - 10 reps Doorway Pec Stretch at 60 Elevation - 1 x daily - 1 reps - 30 sec hold Prone Single Arm Shoulder Y - 2 x daily - 6 x weekly - 3 sets - 10 reps Prone Shoulder Horizontal Abduction - 2 x daily - 3-4 x weekly - 10 reps - 2-3 sets - 3 sec hold Prone Shoulder Extension - Single Arm - 2 x daily - 3-4 x weekly - 10 reps - 3 sets - 3 sec hold Standing Single Arm Shoulder PNF D1 Flexion with Anchored Resistance - 2 x daily - 6 x weekly - 2 sets - 10 reps Standing Shoulder Single Arm PNF D2 Flexion with Resistance - 2 x daily - 6 x weekly - 2 sets - 10 reps

## 2020-11-24 ENCOUNTER — Encounter: Payer: Medicare PPO | Admitting: Physical Therapy

## 2020-11-26 ENCOUNTER — Encounter: Payer: Medicare PPO | Admitting: Rehabilitative and Restorative Service Providers"

## 2020-11-30 DIAGNOSIS — D2239 Melanocytic nevi of other parts of face: Secondary | ICD-10-CM | POA: Diagnosis not present

## 2020-11-30 DIAGNOSIS — D1801 Hemangioma of skin and subcutaneous tissue: Secondary | ICD-10-CM | POA: Diagnosis not present

## 2020-11-30 DIAGNOSIS — L720 Epidermal cyst: Secondary | ICD-10-CM | POA: Diagnosis not present

## 2020-12-01 ENCOUNTER — Encounter: Payer: Medicare PPO | Admitting: Physical Therapy

## 2020-12-03 ENCOUNTER — Encounter: Payer: Medicare PPO | Admitting: Rehabilitative and Restorative Service Providers"

## 2020-12-04 DIAGNOSIS — H3561 Retinal hemorrhage, right eye: Secondary | ICD-10-CM | POA: Diagnosis not present

## 2020-12-24 ENCOUNTER — Encounter: Payer: Self-pay | Admitting: Family Medicine

## 2020-12-24 ENCOUNTER — Other Ambulatory Visit: Payer: Self-pay

## 2020-12-24 ENCOUNTER — Ambulatory Visit: Payer: Medicare PPO | Admitting: Family Medicine

## 2020-12-24 VITALS — BP 116/66 | HR 69 | Temp 98.6°F | Wt 164.4 lb

## 2020-12-24 DIAGNOSIS — A6920 Lyme disease, unspecified: Secondary | ICD-10-CM

## 2020-12-24 MED ORDER — DOXYCYCLINE HYCLATE 100 MG PO TABS
100.0000 mg | ORAL_TABLET | Freq: Two times a day (BID) | ORAL | 0 refills | Status: AC
Start: 2020-12-24 — End: 2021-01-07

## 2020-12-24 NOTE — Progress Notes (Signed)
Subjective:    Patient ID: Jasmine Neal, female    DOB: 30-Oct-1945, 75 y.o.   MRN: [id number]  Chief Complaint  Patient presents with   Tick Removal    Happened on 6/4, right shoulder, states not painful. Has not had a fever or any chills, only used OTC itch cream when it itches.     HPI Patient was seen today for ongoing concern.  Patient endorses removing a tick from right posterior shoulder on 12/12/2020.  Patient states she was able to use her fingernail to remove the entire tick.  Patient denied pain at site, erythema, joint pain, fever, chills, nausea, vomiting and was going out of town so she did not worry about the bite.  Patient has since developed an area of erythema surrounding the initial bite.  Patient states she did not think there were any ticks with Lyme disease in [place].  The area has been mildly pruritic.  Patient tried OTC anti-itch cream.  Past Medical History:  Diagnosis Date   Anxiety    Asthma    evalutated by pulmonary severe by spirometry no smoker currently   Breast cancer ([place])    hx of right lumpectomy, 1990 IIA radiation cmfp ajunct rx   Cataract    removed from both eyes   Chronic airway obstruction, not elsewhere classified    Coronary artery calcification seen on CAT scan 01/23/2018   Diverticulosis    only has like 2   Dysplasia    hx treated with cerv conization   Fatty liver    US done 2010   Fibromyalgia    Hx of nonmelanoma skin cancer     followed dr [place]ique    Hyperlipidemia    Hypothyroidism    Malignant neoplasm of breast (female), unspecified site 11/28/2013   Osteoarthritis    Osteopenia    REM sleep behavior disorder    Skin cancer    squamous cell    Allergies  Allergen Reactions   Hydrocodone Itching    ROS General: Denies fever, chills, night sweats, changes in weight, changes in appetite HEENT: Denies headaches, ear pain, changes in vision, rhinorrhea, sore throat CV: Denies CP, palpitations, SOB,  orthopnea Pulm: Denies SOB, cough, wheezing GI: Denies abdominal pain, nausea, vomiting, diarrhea, constipation GU: Denies dysuria, hematuria, frequency, vaginal discharge Msk: Denies muscle cramps, joint pains Neuro: Denies weakness, numbness, tingling Skin: Denies bruising + tick bite with rash Psych: Denies depression, anxiety, hallucinations     Objective:    Blood pressure 116/66, pulse 69, temperature 98.6 F (37 C), temperature source Oral, weight 164 lb 6.4 oz (74.6 kg), SpO2 94 %.  Gen. Pleasant, well-nourished, in no distress, normal affect   HEENT: [place]/AT, face symmetric, conjunctiva clear, no scleral icterus, PERRLA, EOMI, nares patent without drainage, Lungs: no accessory muscle use Cardiovascular: RRR, no peripheral edema Musculoskeletal: No deformities, no cyanosis or clubbing, normal tone Neuro:  A&Ox3, CN II-XII intact, normal gait Skin:  Warm, dry, intact.  Target lesion-central area of erythema, slightly raised surrounded by area of clearing and erythematous circle on right posterior shoulder     Wt Readings from Last 3 Encounters:  12/24/20 164 lb 6.4 oz (74.6 kg)  11/19/20 159 lb 9.6 oz (72.4 kg)  10/12/20 162 lb 6.4 oz (73.7 kg)    Lab Results  Component Value Date   WBC 6.5 04/16/2020   HGB 13.2 04/16/2020   HCT 40.3 04/16/2020   PLT 163.0 04/16/2020   GLUCOSE 91 03/23/2020  CHOL 147 03/23/2020   TRIG 60 03/23/2020   HDL 57 03/23/2020   LDLDIRECT 137.0 09/11/2012   LDLCALC 76 03/23/2020   ALT 17 03/23/2020   AST 17 03/23/2020   NA 140 03/23/2020   K 3.8 03/23/2020   CL 104 03/23/2020   CREATININE 0.89 03/23/2020   BUN 19 03/23/2020   CO2 28 03/23/2020   TSH 0.63 03/23/2020   HGBA1C 5.7 03/12/2019    Assessment/Plan:  Erythema migrans (Lyme disease) -Target lesion present on right posterior shoulder -Discussed the need to start doxycycline twice daily x14 days -Discussed r/b/a of starting doxycycline. -Patient strongly encouraged to  remain out of the sun to avoid photosensitivity reaction -Given information on tick bites and Lyme disease -Given precautions - Plan: doxycycline (VIBRA-TABS) 100 MG tablet  F/u prn  Grier Mitts, MD

## 2021-01-25 ENCOUNTER — Encounter: Payer: Self-pay | Admitting: Internal Medicine

## 2021-01-25 ENCOUNTER — Ambulatory Visit: Payer: Medicare PPO | Admitting: Internal Medicine

## 2021-01-25 ENCOUNTER — Other Ambulatory Visit: Payer: Self-pay

## 2021-01-25 ENCOUNTER — Ambulatory Visit (HOSPITAL_COMMUNITY)
Admission: RE | Admit: 2021-01-25 | Discharge: 2021-01-25 | Disposition: A | Discharge status: Home / Self Care | Payer: Medicare PPO | Source: Ambulatory Visit | Attending: Internal Medicine | Admitting: Internal Medicine

## 2021-01-25 VITALS — BP 130/82 | HR 77 | Temp 98.3°F | Ht 69.0 in | Wt 163.2 lb

## 2021-01-25 DIAGNOSIS — M25552 Pain in left hip: Secondary | ICD-10-CM | POA: Diagnosis not present

## 2021-01-25 DIAGNOSIS — M7989 Other specified soft tissue disorders: Secondary | ICD-10-CM | POA: Diagnosis not present

## 2021-01-25 DIAGNOSIS — M79662 Pain in left lower leg: Secondary | ICD-10-CM | POA: Insufficient documentation

## 2021-01-25 NOTE — Progress Notes (Signed)
Chief Complaint  Patient presents with   Knee Pain    Patient complains of bi-lateral knee pains, x6 weeks, Tried Tylenol and Ice therapy, Patient reports pain when walking, Denies any known injury.   Joint Swelling    Patient reports ankle swelling on left ankle    HPI: Jasmine Neal 75 y.o. come in for new problem she noted about 6 weeks ago some pain in her left upper calf she was doing regular walking.  It then improved but pain occurred in the left medial leg calf and then now down closer to the Achilles.  There was no specific injury she has reduced her walking because of the pain but the calf pain is better.  There was no injury falling. Symptoms decrease in the morning and increase in the afternoon but do not totally go away. However she does have a history of left knee surgery and has been getting some intermittent left groin hip pain not associated. No recent prolonged travel flying No bleeding except gets nosebleeds No history of DVT Negative family history. Was rx for poss Lyme disease with EM type lesion in June .  And given doxycyclein rx  ROS: See pertinent positives and negatives per HPI.  Past Medical History:  Diagnosis Date   Anxiety    Asthma    evalutated by pulmonary severe by spirometry no smoker currently   Breast cancer ([place])    hx of right lumpectomy, 1990 IIA radiation cmfp ajunct rx   Cataract    removed from both eyes   Chronic airway obstruction, not elsewhere classified    Coronary artery calcification seen on CAT scan 01/23/2018   Diverticulosis    only has like 2   Dysplasia    hx treated with cerv conization   Fatty liver    US done 2010   Fibromyalgia    Hx of nonmelanoma skin cancer     followed dr [place]ique    Hyperlipidemia    Hypothyroidism    Malignant neoplasm of breast (female), unspecified site 11/28/2013   Osteoarthritis    Osteopenia    REM sleep behavior disorder    Skin cancer    squamous cell    Family History   Problem Relation Age of Onset   Other Father        mysthenia gravis   Lung cancer Sister    Osteoporosis Other    Thyroid disease Other    Breast cancer Mother 41       possible uterine cancer diagnosis at age 11   Osteoporosis Mother        no hip fracture   Breast cancer Sister 64   Lung cancer Sister 27       former smoker   Lung cancer Paternal Grandmother    Colon cancer Neg Hx    Esophageal cancer Neg Hx    Stomach cancer Neg Hx    Rectal cancer Neg Hx     Social History   Socioeconomic History   Marital status: Widowed    Spouse name: Not on file   Number of children: 1   Years of education: Not on file   Highest education level: Not on file  Occupational History   Occupation: Retired Pharmacist, hospital  Tobacco Use   Smoking status: Former    Packs/day: 1.00    Years: 10.00    Pack years: 10.00    Types: Cigarettes    Start date: 1964    Quit date: 07/11/1974  Years since quitting: 46.5   Smokeless tobacco: Never  Vaping Use   Vaping Use: Never used  Substance and Sexual Activity   Alcohol use: Yes    Alcohol/week: 7.0 standard drinks    Types: 7 Standard drinks or equivalent per week    Comment: 1 glass of red wine nightly and socially   Drug use: No   Sexual activity: Yes    Partners: Male  Other Topics Concern   Not on file  Social History Narrative   Retired Pharmacist, hospital    hh of 1   Now has puppy    Quit smoking 30 + years ago. 20 year pack year smoking hx   Regular exercise- some  treadmill    8 hours sleep         Caffeine use: 1-2 cups of coffee/[employer] instant   Husband dx glioblastoma fall 15   Passed away fall October 27, 2014   Social Determinants of Health   Financial Resource Strain: Low Risk    Difficulty of Paying Living Expenses: Not hard at all  Food Insecurity: No Food Insecurity   Worried About Charity fundraiser in the Last Year: Never true   Ran Out of Food in the Last Year: Never true  Transportation Needs: No Transportation Needs   Lack  of Transportation (Medical): No   Lack of Transportation (Non-Medical): No  Physical Activity: Sufficiently Active   Days of Exercise per Week: 5 days   Minutes of Exercise per Session: 30 min  Stress: No Stress Concern Present   Feeling of Stress : Not at all  Social Connections: Moderately Isolated   Frequency of Communication with Friends and Family: More than three times a week   Frequency of Social Gatherings with Friends and Family: More than three times a week   Attends Religious Services: 1 to 4 times per year   Active Member of [employer] or Organizations: No   Attends Archivist Meetings: Never   Marital Status: Widowed    Outpatient Medications Prior to Visit  Medication Sig Dispense Refill   acetaminophen (TYLENOL) 500 MG tablet Take 1,000 mg by mouth daily as needed. pain     albuterol (VENTOLIN HFA) 108 (90 Base) MCG/ACT inhaler Inhale 2 puffs into the lungs every 6 (six) hours as needed for wheezing or shortness of breath. 8 g 5   atorvastatin (LIPITOR) 20 MG tablet Take 1 tablet (20 mg total) by mouth daily. 90 tablet 3   BREO ELLIPTA 100-25 MCG/INH AEPB INHALE 1 PUFF ONE TIME DAILY 180 each 3   Cholecalciferol (VITAMIN D3) 50 MCG (2000 UT) TABS Take by mouth daily.     diphenhydramine-acetaminophen (TYLENOL PM) 25-500 MG TABS Take 1 tablet by mouth at bedtime as needed. sleep     nitroGLYCERIN (NITRODUR - DOSED IN MG/24 HR) 0.2 mg/hr patch Apply 1/4 patch daily to tendon for tendonitis. 30 patch 1   PARoxetine (PAXIL) 40 MG tablet Take 1 tablet (40 mg total) by mouth at bedtime. 90 tablet 3   SYNTHROID 88 MCG tablet Take 1 tablet (88 mcg total) by mouth daily. 90 tablet 3   Tiotropium Bromide Monohydrate (SPIRIVA RESPIMAT) 2.5 MCG/ACT AERS Inhale into the lungs in the morning and at bedtime. 2 puffs daily     valACYclovir (VALTREX) 1000 MG tablet TAKE 2 TABLETS BY MOUTH TWICE DAILY AS DIRECTED (Patient taking differently: Take 1,000 mg by mouth 3 times/day as  needed-between meals & bedtime. TAKE 2 TABLETS BY MOUTH TWICE DAILY  AS DIRECTED as needed for cold sores) 90 tablet prn   vitamin B-12 (CYANOCOBALAMIN) 1000 MCG tablet Take 1,000 mcg by mouth daily.     vitamin C (ASCORBIC ACID) 500 MG tablet Take 500 mg by mouth daily.     Facility-Administered Medications Prior to Visit  Medication Dose Route Frequency Provider Last Rate Last Admin   0.9 %  sodium chloride infusion  500 mL Intravenous Once [place], MD       0.9 %  sodium chloride infusion  500 mL Intravenous Once [place] Park, MD         EXAM:  BP 130/82 (BP Location: Left Arm, Patient Position: Sitting, Cuff Size: Normal)   Pulse 77   Temp 98.3 F (36.8 C) (Oral)   Ht 5\' 9"  (1.753 m)   Wt 163 lb 3.2 oz (74 kg)   SpO2 96%   BMI 24.10 kg/m   Body mass index is 24.1 kg/m.  GENERAL: vitals reviewed and listed above, alert, oriented, appears well hydrated and in no acute distress HEENT: atraumatic, conjunctiva  clear, no obvious abnormalities on inspection of external nose and ears OP : Masked NECK: no obvious masses on inspection palpation  CV: HRRR, no clubbing cyanosis l cap refill  Abdomen soft without obvious organomegaly groin area shows no adenopathy masses or tenderness. MS: moves all extremities without noticeable focal  abnormality left lower extremity slightly larger than right puffy around ankles varicose veins noted no cords warmth or redness calf does not appear tight. PSYCH: pleasant and cooperative, no obvious depression or anxiety Lab Results  Component Value Date   WBC 6.5 04/16/2020   HGB 13.2 04/16/2020   HCT 40.3 04/16/2020   PLT 163.0 04/16/2020   GLUCOSE 91 03/23/2020   CHOL 147 03/23/2020   TRIG 60 03/23/2020   HDL 57 03/23/2020   LDLDIRECT 137.0 09/11/2012   LDLCALC 76 03/23/2020   ALT 17 03/23/2020   AST 17 03/23/2020   NA 140 03/23/2020   K 3.8 03/23/2020   CL 104 03/23/2020   CREATININE 0.89 03/23/2020   BUN 19 03/23/2020    CO2 28 03/23/2020   TSH 0.63 03/23/2020   HGBA1C 5.7 03/12/2019   BP Readings from Last 3 Encounters:  01/25/21 130/82  12/24/20 116/66  11/19/20 132/88    ASSESSMENT AND PLAN:  Discussed the following assessment and plan:  Pain and swelling of left lower leg - Plan: VAS [place] LOWER EXTREMITY VENOUS (DVT)  Left hip pain Left lower  leg swelling    consider  dvt V insufficiency new .   Order asap doppler  for left leg.   ;left groin  sx may be   hip related  no obv obstructive on exam .   Follow-up depending on Doppler results and go from there.  Consideration of vascular consult. Suspect the groin pain is not related I see no obstructive findings on exam -Patient advised to return or notify health care team  if  new concerns arise.  Patient Instructions  Ordering  doppler ultrasound  of veins left leg to check for blood clots   that need treatment . Otherwise  we may  consider  venous insufficieny as a cause . You will  be contacted about  appt for this test and then fu depending on results.     Standley Brooking. Tanga Gloor M.D.

## 2021-01-25 NOTE — Progress Notes (Signed)
Good news the preliminary study shows no clot Will await the final. Consider referral to vascular for consult if persistent and progressive.

## 2021-01-25 NOTE — Patient Instructions (Addendum)
Ordering  doppler ultrasound  of veins left leg to check for blood clots   that need treatment . Otherwise  we may  consider  venous insufficieny as a cause . You will  be contacted about  appt for this test and then fu depending on results.

## 2021-01-25 NOTE — Progress Notes (Signed)
Lower extremity venous has been completed.   Preliminary results in CV Proc.   Jasmine Neal 01/25/2021 3:34 PM

## 2021-01-26 ENCOUNTER — Other Ambulatory Visit: Payer: Self-pay

## 2021-01-26 DIAGNOSIS — I8390 Asymptomatic varicose veins of unspecified lower extremity: Secondary | ICD-10-CM

## 2021-01-26 DIAGNOSIS — M79662 Pain in left lower leg: Secondary | ICD-10-CM

## 2021-01-26 NOTE — Progress Notes (Signed)
Final report negative for clot .   Please do referral to vascular  surgery  if  she agrees  .

## 2021-01-27 ENCOUNTER — Other Ambulatory Visit: Payer: Self-pay | Admitting: Internal Medicine

## 2021-02-19 ENCOUNTER — Other Ambulatory Visit: Payer: Self-pay | Admitting: *Deleted

## 2021-02-19 ENCOUNTER — Encounter: Payer: Self-pay | Admitting: Physician Assistant

## 2021-02-19 ENCOUNTER — Ambulatory Visit (HOSPITAL_COMMUNITY)
Admission: RE | Admit: 2021-02-19 | Discharge: 2021-02-19 | Disposition: A | Discharge status: Home / Self Care | Payer: Medicare PPO | Source: Ambulatory Visit | Attending: Surgery | Admitting: Surgery

## 2021-02-19 ENCOUNTER — Other Ambulatory Visit: Payer: Self-pay

## 2021-02-19 ENCOUNTER — Ambulatory Visit (INDEPENDENT_AMBULATORY_CARE_PROVIDER_SITE_OTHER): Payer: Medicare PPO | Admitting: Physician Assistant

## 2021-02-19 VITALS — BP 145/83 | HR 72 | Temp 97.2°F | Ht 68.0 in | Wt 161.8 lb

## 2021-02-19 DIAGNOSIS — M7989 Other specified soft tissue disorders: Secondary | ICD-10-CM | POA: Diagnosis not present

## 2021-02-19 DIAGNOSIS — M79662 Pain in left lower leg: Secondary | ICD-10-CM | POA: Diagnosis not present

## 2021-02-19 NOTE — Progress Notes (Signed)
[facility] [place]   Reason for referral: Swollen left leg  History of Present Illness  Jasmine Neal is a 75 y.o. female who presents with chief complaint: swollen leg.  Patient notes, onset of left calf pain 7 weeks ago ago, followed by 3 weeks of swelling.  This was associated with walking.  The patient has had no history of DVT, positive history of varicose vein, no history of venous stasis ulcers, no history of  Lymphedema and no history of skin changes in lower legs.  There is no family history of venous disorders.  The patient has not used compression stockings in the past.  She states the pain and swelling have gone away over the past few weeks.  She denise symptoms of claudication, rest pain and non healing ulcers.  She denise trauma to the left leg.    She denise DM, SOB or CP. Past medical history includes: HTN and hyperlipidemia.  She is active and walks daily.  Past Medical History:  Diagnosis Date   Anxiety    Asthma    evalutated by pulmonary severe by spirometry no smoker currently   Breast cancer ([place])    hx of right lumpectomy, 1990 IIA radiation cmfp ajunct rx   Cataract    removed from both eyes   Chronic airway obstruction, not elsewhere classified    Coronary artery calcification seen on CAT scan 01/23/2018   Diverticulosis    only has like 2   Dysplasia    hx treated with cerv conization   Fatty liver    US done 2010   Fibromyalgia    Hx of nonmelanoma skin cancer     followed dr [place]    Hyperlipidemia    Hypothyroidism    Malignant neoplasm of breast (female), unspecified site 11/28/2013   Osteoarthritis    Osteopenia    REM sleep behavior disorder    Skin cancer    squamous cell    Past Surgical History:  Procedure Laterality Date   BREAST LUMPECTOMY     on right, 1990 IIA radiation cmfp ajuction rx   COLONOSCOPY     KNEE SURGERY  2012   NASAL SINUS SURGERY  2007   on right done by Dr. Wilburn Cornelia    Social History    Socioeconomic History   Marital status: Widowed    Spouse name: Not on file   Number of children: 1   Years of education: Not on file   Highest education level: Not on file  Occupational History   Occupation: Retired Pharmacist, hospital  Tobacco Use   Smoking status: Former    Packs/day: 1.00    Years: 10.00    Pack years: 10.00    Types: Cigarettes    Start date: 1964    Quit date: 07/11/1974    Years since quitting: 46.6   Smokeless tobacco: Never  Vaping Use   Vaping Use: Never used  Substance and Sexual Activity   Alcohol use: Yes    Alcohol/week: 7.0 standard drinks    Types: 7 Standard drinks or equivalent per week    Comment: 1 glass of red wine nightly and socially   Drug use: No   Sexual activity: Yes    Partners: Male  Other Topics Concern   Not on file  Social History Narrative   Retired Pharmacist, hospital    hh of 1   Now has puppy    Quit smoking 30 + years ago. 20 year pack year smoking  hx   Regular exercise- some  treadmill    8 hours sleep         Caffeine use: 1-2 cups of coffee/[employer] instant   Husband dx glioblastoma fall 15   Passed away fall 09/29/2014   Social Determinants of Health   Financial Resource Strain: Low Risk    Difficulty of Paying Living Expenses: Not hard at all  Food Insecurity: No Food Insecurity   Worried About Charity fundraiser in the Last Year: Never true   Arboriculturist in the Last Year: Never true  Transportation Needs: No Transportation Needs   Lack of Transportation (Medical): No   Lack of Transportation (Non-Medical): No  Physical Activity: Sufficiently Active   Days of Exercise per Week: 5 days   Minutes of Exercise per Session: 30 min  Stress: No Stress Concern Present   Feeling of Stress : Not at all  Social Connections: Moderately Isolated   Frequency of Communication with Friends and Family: More than three times a week   Frequency of Social Gatherings with Friends and Family: More than three times a week   Attends Religious  Services: 1 to 4 times per year   Active Member of [employer] or Organizations: No   Attends Archivist Meetings: Never   Marital Status: Widowed  Human resources officer Violence: Not At Risk   Fear of Current or Ex-Partner: No   Emotionally Abused: No   Physically Abused: No   Sexually Abused: No    Family History  Problem Relation Age of Onset   Other Father        mysthenia gravis   Lung cancer Sister    Osteoporosis Other    Thyroid disease Other    Breast cancer Mother 11       possible uterine cancer diagnosis at age 73   Osteoporosis Mother        no hip fracture   Breast cancer Sister 12   Lung cancer Sister 34       former smoker   Lung cancer Paternal Grandmother    Colon cancer Neg Hx    Esophageal cancer Neg Hx    Stomach cancer Neg Hx    Rectal cancer Neg Hx     Current Outpatient Medications on File Prior to Visit  Medication Sig Dispense Refill   acetaminophen (TYLENOL) 500 MG tablet Take 1,000 mg by mouth daily as needed. pain     atorvastatin (LIPITOR) 20 MG tablet TAKE 1 TABLET EVERY DAY 90 tablet 0   BREO ELLIPTA 100-25 MCG/INH AEPB INHALE 1 PUFF ONE TIME DAILY 180 each 3   Cholecalciferol (VITAMIN D3) 50 MCG (2000 UT) TABS Take by mouth daily.     diphenhydramine-acetaminophen (TYLENOL PM) 25-500 MG TABS Take 1 tablet by mouth at bedtime as needed. sleep     PARoxetine (PAXIL) 40 MG tablet Take 1 tablet (40 mg total) by mouth at bedtime. 90 tablet 3   SYNTHROID 88 MCG tablet TAKE 1 TABLET EVERY DAY 90 tablet 0   vitamin B-12 (CYANOCOBALAMIN) 1000 MCG tablet Take 1,000 mcg by mouth daily.     vitamin C (ASCORBIC ACID) 500 MG tablet Take 500 mg by mouth daily.     albuterol (VENTOLIN HFA) 108 (90 Base) MCG/ACT inhaler Inhale 2 puffs into the lungs every 6 (six) hours as needed for wheezing or shortness of breath. (Patient not taking: Reported on 02/19/2021) 8 g 5   valACYclovir (VALTREX) 1000 MG tablet TAKE 2  TABLETS BY MOUTH TWICE DAILY AS DIRECTED  (Patient not taking: Reported on 02/19/2021) 90 tablet prn   Current Facility-Administered Medications on File Prior to Visit  Medication Dose Route Frequency Provider Last Rate Last Admin   0.9 %  sodium chloride infusion  500 mL Intravenous Once Thornton Park, MD       0.9 %  sodium chloride infusion  500 mL Intravenous Once Thornton Park, MD        Allergies as of 02/19/2021 - Review Complete 02/19/2021  Allergen Reaction Noted   Hydrocodone Itching 11/08/2010     ROS:   General:  No weight loss, Fever, chills  HEENT: No recent headaches, no nasal bleeding, no visual changes, no sore throat  Neurologic: No dizziness, blackouts, seizures. No recent symptoms of stroke or mini- stroke. No recent episodes of slurred speech, or temporary blindness.  Cardiac: No recent episodes of chest pain/pressure, no shortness of breath at rest.  No shortness of breath with exertion.  Denies history of atrial fibrillation or irregular heartbeat  Vascular: No history of rest pain in feet.  No history of claudication.  No history of non-healing ulcer, No history of DVT   Pulmonary: No home oxygen, no productive cough, no hemoptysis,  No asthma or wheezing  Musculoskeletal:  '[ ]'$  Arthritis, [ x] Low back pain,  [ x] Joint pain  Hematologic:No history of hypercoagulable state.  No history of easy bleeding.  No history of anemia  Gastrointestinal: No hematochezia or melena,  No gastroesophageal reflux, no trouble swallowing  Urinary: '[ ]'$  chronic Kidney disease, '[ ]'$  on HD - '[ ]'$  MWF or '[ ]'$  TTHS, '[ ]'$  Burning with urination, '[ ]'$  Frequent urination, '[ ]'$  Difficulty urinating;   Skin: No rashes  Psychological: No history of anxiety,  No history of depression  Physical Examination  Vitals:   02/19/21 0836  BP: (!) 145/83  Pulse: 72  Temp: (!) 97.2 F (36.2 C)  TempSrc: Temporal  SpO2: 96%  Weight: 161 lb 12.8 oz (73.4 kg)  Height: '5\' 8"'$  (1.727 m)    Body mass index is 24.6  kg/m.  General:  Alert and oriented, no acute distress HEENT: Normal Neck: No bruit or JVD Pulmonary: Clear to auscultation bilaterally Cardiac: Regular Rate and Rhythm without murmur Abdomen: Soft, non-tender, non-distended, no mass, no scars Skin: No rash Extremity Pulses:  2+ radial, brachial, femoral, dorsalis pedis, posterior tibial pulses bilaterally Musculoskeletal: No deformity or edema  Neurologic: Upper and lower extremity motor 5/5 and symmetric  DATA:    +--------------+---------+------+-----------+------------+--------+  LEFT          Reflux NoRefluxReflux TimeDiameter cmsComments                          Yes                                   +--------------+---------+------+-----------+------------+--------+  CFV                     yes   >1 second                       +--------------+---------+------+-----------+------------+--------+  FV mid        no                                              +--------------+---------+------+-----------+------------+--------+  Popliteal     no                                              +--------------+---------+------+-----------+------------+--------+  GSV at [facility]              yes    >500 ms      0.57              +--------------+---------+------+-----------+------------+--------+  GSV prox thighno                            0.41              +--------------+---------+------+-----------+------------+--------+  GSV mid thigh no                            0.35              +--------------+---------+------+-----------+------------+--------+  GSV dist thighno                            0.22              +--------------+---------+------+-----------+------------+--------+  GSV at knee   no                            0.26              +--------------+---------+------+-----------+------------+--------+  GSV prox calf no                            0.29               +--------------+---------+------+-----------+------------+--------+  SSV Pop Fossa no                            0.18              +--------------+---------+------+-----------+------------+--------+  SSV prox calf no                            0.2               +--------------+---------+------+-----------+------------+--------+  SSV mid calf  no                            0.17              +--------------+---------+------+-----------+------------+--------+       Summary:  Left:  - No evidence of deep vein thrombosis seen in the left lower extremity,  from the common femoral through the popliteal veins.  - No evidence of superficial venous thrombosis in the left lower  extremity.  - Venous reflux is noted in the left common femoral vein.  - Venous reflux is noted in the left sapheno-femoral junction.     Assessment/Plan: 7 week history of left calf pain followed by 3 week history of edema. Her symptoms have disappeared.  She denise pain and edema in the left LE. She has "spider veins and left lateral posterior varicose vein.  She has no skin changes, no weeping and no edema.  He DP/PT  pedal pulses are palpable, as well as B popliteal pulses.  She is not at risk of limb loss.  Her venous reflux study shows SFJ reflux only.  Her veins are "normal" size.  She does not meet the criteria for venous intervention.  Symptoms would be chronic edema, non healing wounds, skin changes with reflux at [facility] and GSV with the vein size > 4 mm.  If she develops symptoms  she will call.  No intervention at this time.    She may have injured her left calf  muscle causing pain and edema.  Her symptoms are gone completely.  F/U PRN.    Roxy Horseman PA-C [facility] [place] Office: [phone]  MD on call Trula Slade

## 2021-03-13 ENCOUNTER — Other Ambulatory Visit: Payer: Self-pay | Admitting: Internal Medicine

## 2021-03-16 ENCOUNTER — Ambulatory Visit (INDEPENDENT_AMBULATORY_CARE_PROVIDER_SITE_OTHER): Payer: Medicare PPO | Admitting: Internal Medicine

## 2021-03-16 VITALS — Wt 163.0 lb

## 2021-03-16 DIAGNOSIS — J0101 Acute recurrent maxillary sinusitis: Secondary | ICD-10-CM | POA: Diagnosis not present

## 2021-03-16 MED ORDER — AMOXICILLIN-POT CLAVULANATE 875-125 MG PO TABS
1.0000 | ORAL_TABLET | Freq: Two times a day (BID) | ORAL | 0 refills | Status: AC
Start: 2021-03-16 — End: 2021-03-23

## 2021-03-16 NOTE — Progress Notes (Signed)
Virtual Visit via Telephone Note  I connected with Jasmine Neal on 03/16/21 at  1:30 PM EDT by telephone and verified that I am speaking with the correct person using two identifiers.   I discussed the limitations, risks, security and privacy concerns of performing an evaluation and management service by telephone and the availability of in person appointments. I also discussed with the patient that there may be a patient responsible charge related to this service. The patient expressed understanding and agreed to proceed.  Location patient: home Location provider: work office Participants present for the call: patient, provider Patient did not have a visit in the prior 7 days to address this/these issue(s).   History of Present Illness:  She has a h/o recurrent sinusitis. For about 3-4 days has been having nasal congestion and what she describes as left cheek swelling. She denies fever or earache. She already has an appt with ENT scheduled for next week and is requesting an abx now until she can see them.   Observations/Objective: Patient sounds cheerful and well on the phone. I do not appreciate any increased work of breathing. Speech and thought processing are grossly intact. Patient reported vitals: none reported.   Current Outpatient Medications:    acetaminophen (TYLENOL) 500 MG tablet, Take 1,000 mg by mouth daily as needed. pain, Disp: , Rfl:    albuterol (VENTOLIN HFA) 108 (90 Base) MCG/ACT inhaler, Inhale 2 puffs into the lungs every 6 (six) hours as needed for wheezing or shortness of breath., Disp: 8 g, Rfl: 5   amoxicillin-clavulanate (AUGMENTIN) 875-125 MG tablet, Take 1 tablet by mouth 2 (two) times daily for 7 days., Disp: 14 tablet, Rfl: 0   atorvastatin (LIPITOR) 20 MG tablet, TAKE 1 TABLET EVERY DAY, Disp: 90 tablet, Rfl: 0   BREO ELLIPTA 100-25 MCG/INH AEPB, INHALE 1 PUFF ONE TIME DAILY, Disp: 180 each, Rfl: 3   Cholecalciferol (VITAMIN D3) 50 MCG (2000  UT) TABS, Take by mouth daily., Disp: , Rfl:    diphenhydramine-acetaminophen (TYLENOL PM) 25-500 MG TABS, Take 1 tablet by mouth at bedtime as needed. sleep, Disp: , Rfl:    PARoxetine (PAXIL) 40 MG tablet, TAKE 1 TABLET AT BEDTIME, Disp: 90 tablet, Rfl: 0   SYNTHROID 88 MCG tablet, TAKE 1 TABLET EVERY DAY, Disp: 90 tablet, Rfl: 0   valACYclovir (VALTREX) 1000 MG tablet, TAKE 2 TABLETS BY MOUTH TWICE DAILY AS DIRECTED, Disp: 90 tablet, Rfl: prn   vitamin B-12 (CYANOCOBALAMIN) 1000 MCG tablet, Take 1,000 mcg by mouth daily., Disp: , Rfl:    vitamin C (ASCORBIC ACID) 500 MG tablet, Take 500 mg by mouth daily., Disp: , Rfl:   Current Facility-Administered Medications:    0.9 %  sodium chloride infusion, 500 mL, Intravenous, Once, Beavers, Kimberly, MD   0.9 %  sodium chloride infusion, 500 mL, Intravenous, Once, Thornton Park, MD  Review of Systems:  Constitutional: Denies fever, chills, diaphoresis, appetite change and fatigue.  HEENT: Denies photophobia, eye pain, redness, hearing loss, ear pain, , sore throat, rhinorrhea, sneezing, mouth sores, trouble swallowing, neck pain, neck stiffness and tinnitus.   Respiratory: Denies SOB, DOE, cough, chest tightness,  and wheezing.   Cardiovascular: Denies chest pain, palpitations and leg swelling.  Gastrointestinal: Denies nausea, vomiting, abdominal pain, diarrhea, constipation, blood in stool and abdominal distention.  Genitourinary: Denies dysuria, urgency, frequency, hematuria, flank pain and difficulty urinating.  Endocrine: Denies: hot or cold intolerance, sweats, changes in hair or nails, polyuria, polydipsia. Musculoskeletal: Denies myalgias,  back pain, joint swelling, arthralgias and gait problem.  Skin: Denies pallor, rash and wound.  Neurological: Denies dizziness, seizures, syncope, weakness, light-headedness, numbness and headaches.  Hematological: Denies adenopathy. Easy bruising, personal or family bleeding history   Psychiatric/Behavioral: Denies suicidal ideation, mood changes, confusion, nervousness, sleep disturbance and agitation   Assessment and Plan:  Acute recurrent maxillary sinusitis  - Plan: amoxicillin-clavulanate (AUGMENTIN) 875-125 MG tablet for 7 days. -She has ENT f/u next week. Also concerned about parotiditis given cheek swelling that she describes.    I discussed the assessment and treatment plan with the patient. The patient was provided an opportunity to ask questions and all were answered. The patient agreed with the plan and demonstrated an understanding of the instructions.   The patient was advised to call back or seek an in-person evaluation if the symptoms worsen or if the condition fails to improve as anticipated.  I provided 13 minutes of non-face-to-face time during this encounter.   Lelon Frohlich, MD [facility] at [facility]

## 2021-03-23 DIAGNOSIS — R220 Localized swelling, mass and lump, head: Secondary | ICD-10-CM | POA: Diagnosis not present

## 2021-04-06 DIAGNOSIS — H3561 Retinal hemorrhage, right eye: Secondary | ICD-10-CM | POA: Diagnosis not present

## 2021-04-16 ENCOUNTER — Other Ambulatory Visit: Payer: Self-pay | Admitting: *Deleted

## 2021-04-16 DIAGNOSIS — J4540 Moderate persistent asthma, uncomplicated: Secondary | ICD-10-CM

## 2021-04-19 ENCOUNTER — Ambulatory Visit (INDEPENDENT_AMBULATORY_CARE_PROVIDER_SITE_OTHER): Payer: Medicare PPO | Admitting: Internal Medicine

## 2021-04-19 ENCOUNTER — Other Ambulatory Visit: Payer: Self-pay

## 2021-04-19 ENCOUNTER — Ambulatory Visit: Payer: Medicare PPO | Admitting: Internal Medicine

## 2021-04-19 ENCOUNTER — Other Ambulatory Visit: Payer: Self-pay | Admitting: *Deleted

## 2021-04-19 ENCOUNTER — Encounter: Payer: Self-pay | Admitting: Internal Medicine

## 2021-04-19 VITALS — BP 138/78 | HR 81 | Temp 97.3°F | Ht 63.0 in | Wt 163.0 lb

## 2021-04-19 DIAGNOSIS — J4540 Moderate persistent asthma, uncomplicated: Secondary | ICD-10-CM

## 2021-04-19 DIAGNOSIS — Z23 Encounter for immunization: Secondary | ICD-10-CM | POA: Diagnosis not present

## 2021-04-19 DIAGNOSIS — Z8709 Personal history of other diseases of the respiratory system: Secondary | ICD-10-CM

## 2021-04-19 LAB — PULMONARY FUNCTION TEST
DL/VA % pred: 136 %
DL/VA: 5.38 ml/min/mmHg/L
DLCO cor % pred: 81 %
DLCO cor: 18.39 ml/min/mmHg
DLCO unc % pred: 81 %
DLCO unc: 18.39 ml/min/mmHg
FEF 25-75 Post: 0.54 L/sec
FEF 25-75 Pre: 0.42 L/sec
FEF2575-%Change-Post: 28 %
FEF2575-%Pred-Post: 28 %
FEF2575-%Pred-Pre: 21 %
FEV1-%Change-Post: 5 %
FEV1-%Pred-Post: 39 %
FEV1-%Pred-Pre: 37 %
FEV1-Post: 1.01 L
FEV1-Pre: 0.96 L
FEV1FVC-%Change-Post: 7 %
FEV1FVC-%Pred-Pre: 68 %
FEV6-%Change-Post: 0 %
FEV6-%Pred-Post: 56 %
FEV6-%Pred-Pre: 56 %
FEV6-Post: 1.83 L
FEV6-Pre: 1.83 L
FEV6FVC-%Change-Post: 2 %
FEV6FVC-%Pred-Post: 103 %
FEV6FVC-%Pred-Pre: 101 %
FVC-%Change-Post: -2 %
FVC-%Pred-Post: 54 %
FVC-%Pred-Pre: 55 %
FVC-Post: 1.84 L
FVC-Pre: 1.88 L
Post FEV1/FVC ratio: 55 %
Post FEV6/FVC ratio: 99 %
Pre FEV1/FVC ratio: 51 %
Pre FEV6/FVC Ratio: 97 %

## 2021-04-19 LAB — NITRIC OXIDE: Nitric Oxide: 9

## 2021-04-19 NOTE — Progress Notes (Signed)
Spirometry pre/post and DLCO performed today. 

## 2021-04-19 NOTE — Patient Instructions (Addendum)
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 Dec 2021 -  Plan -no further folowup  Fluid level in esophagus - seeon CT dec 2020   - per your report endoscopy by Dr Beavers  2021 without adversity  Plan   - PPI per GI  Followup 6 months or sooner with Dr Chase Caller

## 2021-04-19 NOTE — Progress Notes (Signed)
PCP Panosh, Standley Brooking, MD   HPI  IOV 08/18/2017  Chief Complaint  Patient presents with   pulmonary consult    SOB with activity wheezing with exercise and a cough in the morning.    75 year old female whose husband died seen in September 05, 2013 for a lung nodule and he subsequently died from glioblastoma multiform A.  She is here as a new consult.  Her issues that she has had over 10 years of shortness of breath with exertion relieved by rest.  Insidious onset.  Slowly getting worse.  She also has cough with early morning wheezing.  The cough is dry but occasionally she has congestion.  Symptoms are indeed progressive.  Several years ago she was on Symbicort or Advair.  She does not remember if this improved her.  Then a few years ago she got changed to [facility] along with Asmanex.  6 months ago the Asmanex was stopped.  Overall these changes have not helped her.  She has learned to live with this. Vacuuming can  cause problems symptoms .  There is no associated chest pain.  She does not have hypertension or diabetes.  She is worried about the risk of lung cancer.  There is because a paternal grandfather and sister died from lung cancer.  Lab review shows that she has eosinophilia that is mild at 300 cells per cubic millimeter.  She has had previous allergy testing that was positive done by Dr. Neldon Mc.  Last CT scan of the chest was in Sep 05, 2004.  In May 2018 she had spirometry at the outside.  I personally visualized the trace.  FEV1 0.9 L / 38% FVC 46% with an obstructed ratio.  Exam nitric oxide today is elevated at 58 ppb. IN terms of smoking history, she smoke 1 ppd x 10 years but quit when she was 27.,> reports allrgy test 10 years ago as positive only for dust mites   OV 09/22/2017  Chief Complaint  Patient presents with   Follow-up    PFT done, no asthma flare at this time    CHASITY OUTTEN returns for follow-up.  She tells me that after starting Brio her symptoms are significantly improved.  In  fact asthma control questionnaire shows a symptom score of 0.5.  She is now waking up at night because of asthma symptoms.  When she wakes up she has no symptoms.  She feels very slightly limited in her activities only when walking uphill.  And she is actually has a very little shortness of breath but again only while walking uphill or climbing stairs.  She has never wheezed and not use albuterol for rescue at all.  Overall is a huge improvement.   Walking desaturation test on 09/22/2017 185 feet x 3 laps on ROOM AIR:  did no desaturate. Rest pulse ox was 100%, final pulse ox was 97%. HR response 69/min at rest to 93/min at peak exertion. Patient ALEKSA COLLINSWORTH  Did not Desaturate < 88% . Tyah S Leveque yes  Desaturated </= 3% points. Mayana S Kunda yes did get tachyardic.  But she did drop pulse ox by 3 points.  In talking to her she tells me that she is got this fixed exertional dyspnea that is relieved by rest.  This may be slowly progressive.  There is no cough or wheezing.  She had pulmonary function test today and this shows significant severe obstruction and on bronchodilator responsiveness flow volume loop abnormalities.  The  DLCO was also reduced at 60%.  She is very surprised that her lung function abnormalities also severe because she does not feel the symptoms as much and she under perceives the symptoms relative to the lung function abnormality.  She is very keen on getting a CT scan of the chest because of family history of lung cancer.   OV 02/16/2018  Chief Complaint  Patient presents with   Follow-up    5 month follow up to discuss test results.    [name] eturns for follow-up of 2 issues  Moderate persistent asthma with fixed exertional dyspnea: She is tells me that with Brio her symptoms of improvement. Given the fixed exertional dyspnea is better. Overall asthma control questionnaire shows a score of 0.4 showing excellent control. She is not waking up in the middle of the  night with symptoms when she wakes up she has no symptoms. She is very slightly limited in her activities because of asthma and she's experienced very little shortness of breath. She does not have any wheeze or does not use albuterol for rescue  Follow-up multiple lung nodules: the largest in 58mm RUL nodule and this stable march 2019  -> July 2019    IMPRESSION: CT chest Near complete re solution of ill-defined nodular opacity in medial left lower lobe, consistent with resolving inflammatory or infectious etiology.   Multiple other sub-cm bilateral pulmonary nodules remain stable, largest in right lung apex measuring 9 mm. Recommend continued follow-up by chest CT without contrast in 12 months. This recommendation follows the consensus statement: Guidelines for Management of Small Pulmonary Nodules Detected on CT Images: From the Fleischner Society 2017; Radiology 2017; 284:228-243.     Electronically Signed   By: JEarle GellM.D.   On: 01/23/2018 15:19   OV 03/26/2019  Subjective:  Patient ID: DLoney Loh female , DOB: [birth year], age 75y.o. , MRN: [id number], [place] [id number]  03/26/2019 -   Chief Complaint  Patient presents with   Moderate persistent asthma without complication    Discuss results of PFT      ICD-10-CM   1. Moderate persistent asthma without complication  JU20.25Tiotropium Bromide Monohydrate (SPIRIVA RESPIMAT) 1.25 MCG/ACT AERS  2. Multiple lung nodules on CT  R91.8 Tiotropium Bromide Monohydrate (SPIRIVA RESPIMAT) 1.25 MCG/ACT AERS     HPI Londa S Johannsen 75 y.o. -presents for the above problems.  Visit is nearly after 1 year.  In the interim she is stable.  She is up-to-date with her vaccines.  July 2019 because of coronary artery calcifications and ongoing fixed exertional dyspnea she had a cardiac stress test I reviewed the result and this was normal.  She says the Brio is helping her but despite this she  continues to have fixed exertional dyspnea.  Her asthma control questionnaire is stable.  She had repeat lung function today it is stable and unchanged which is good but she still has severe fixed obstructive lung defect.  She has a family history of myasthenia gravis and her dad but she denies any end of the day fatigue or eyelid drooping symptoms.  Moreover the pulmonary function test is not restrictive.  There is no cough we discussed and she is interested in adding inhaler therapy.  She is not interested in second opinion for a fixed obstructive lung disease defect.   For multiple lung nodules: She has 1-1/2-year stability at this point.      CT  Chest Lungs/Pleura: Multiple bilateral pulmonary nodules, as before. Index nodule in the apical segment right upper lobe measures 7 mm (5 x 8 mm), stable. Post radiation scarring in the anterior subpleural right lung. No pleural fluid. Airway is unremarkable.   Upper Abdomen: Visualized portions of the liver, gallbladder, adrenal glands, kidneys, spleen, pancreas, stomach and bowel are unremarkable. No upper abdominal adenopathy.   Musculoskeletal: Degenerative changes in the spine. No worrisome lytic or sclerotic lesions. Question mild radiation changes in the anterolateral right ribs, as before. No associated fracture.   IMPRESSION: Bilateral pulmonary nodules are stable and likely benign.     Electronically Signed   By: Lorin Picket M.D.   On: 01/30/2019 13:17     OV 09/23/2019  Subjective:  Patient ID: Kalman Shan, female , DOB: [birth year] , age 62 y.o. , MRN: [id number] , ADDRESS: [place] [address]   09/23/2019 -   Chief Complaint  Patient presents with   Follow-up    Pt states she has been doing okay since last visit and denies any recent flare ups with her asthma.     HPI Flannery Cavallero 75 y.o. -   Follow-up moderate persistent asthma: She has fixed obstructive disease that does  not respond to inhaler therapy although symptoms improved.  Therefore back in September 2020 I added Spiriva to the mix.  She says she only has still has mild dyspnea with fixed exertional relieved by rest.  It presents for walking an incline or stairs.  Otherwise she feels fine.  She does not think Spiriva has helped.  She was supposed to have pulmonary function test but because of COVID-19 pandemic this has been disrupted.  We do not have the data.  She said that she met anesthetist who said that he was surprised that she did not have a rescue inhaler.  She has never needed 1.  However I did agree with the anesthesiologist recommendation for her to carry an albuterol inhaler.  Therefore I settle make the prescription.  At this point in time she has had a Covid vaccine.  Pulmonary nodules: This is no stable for 2 years.  Results documented below.  I visualized and interpreted the CT scan myself.  There is report of emphysema on the CT scan but I could not appreciate it.  New issue: Fluid level in the esophagus: There is a new finding on the CT scan I personally visualized it.  She does have Dr. Tarri Glenn is a gastroenterologist.  Last seen towards the end of 2020 but before the CT scan.  There is no mention of any esophageal issues on Dr. Tarri Glenn medical record.  However this finding has postdated that visit.  I have asked the patient to take this up with Dr. Tarri Glenn.       IMPRESSION: CT CHEST 1. Ongoing stability of bilateral pulmonary nodules, most consistent favoring a benign etiology. 2. Right axillary node dissection, without findings of thoracic metastatic disease. 3. Aortic atherosclerosis (ICD10-I70.0), coronary artery atherosclerosis and emphysema (ICD10-J43.9). 4. Esophageal air fluid level suggests dysmotility or gastroesophageal reflux.     Electronically Signed   By: Abigail Miyamoto M.D.   On: 06/25/2019 13:32 ROS - per HPI    OV 12/04/2019  Subjective:  Patient ID: Kalman Shan, female , DOB: May 31, 1946 , age 10 y.o. , MRN: [id number] , ADDRESS: [place] [address]   12/04/2019 -   Chief Complaint  Patient presents with  Follow-up    pt is here to go over spiro results.CT of chestlung nodule. pt states gerd. pt has endoscopy schedule for jun 3rrd   Followup fixed obstrucitve lung disease  HPI Heidy Mccubbin 75 y.o.  - returns for followup. Continue spiriva and breo. No definite improvement in symptoms since spiriva. Overall stable/same. Has dyspnea for stairs and gardening but able to cope. Not a big issues. No flare ups. No prednisone use. No ER visit. She is interested in tapering down her medication regimen but was interested in hearing other therapeutic options. We discussed biologic due to her increased eos as a way to improve symptoms ( would be  A time limited trial) but she is not interested.Marland Kitchen PFT show improvement in fev1 but reduction in dlco - overall on average is stable. No new issues.    In terms of her esophageal issues - she is having EGD in June 2021 by Dr Albin Felling      OV 04/16/2020   Subjective:  Patient ID: Kalman Shan, female , DOB: 11/05/1945, age 78 y.o. years. , MRN: [id number],  ADDRESS: [place] [address] PCP  Regis Bill, Standley Brooking, MD Providers : Treatment Team:  Attending Provider: Brand Males, MD Panosh, Standley Brooking, MD as PCP - General  Patient Care Team: Burnis Medin, MD as PCP - Adelene Idler, MD Martinique, Amy, MD as Consulting Physician (Dermatology) Neldon Mc Donnamarie Poag, MD as Consulting Physician (Allergy and Immunology) Marygrace Drought, MD as Consulting Physician (Ophthalmology) Clent Jacks, MD as Consulting Physician (Ophthalmology) Neldon Mc Donnamarie Poag, MD as Consulting Physician (Allergy and Immunology) Brand Males, MD as Consulting Physician (Pulmonary Disease) Thornton Park, MD as Consulting Physician (Gastroenterology)   Chief  Complaint  Patient presents with   Follow-up    PFT performed today.  Pt states her breathing has been worse since last visit. Denies any complaints of wheezing, cough, or chest tightness.    Follow-up fixed obstructive lung disease   HPI Jaleya Pebley 75 y.o. -last seen in May 2021.  After that she had endoscopy.  I could not visualize the results but I tried to located.  She did have it.  She tells me it was normal but she is on a short course of PPI.  At last visit we stopped her Spiriva as an effort to reduce her overall medication burden.  She says after that she started having more shortness of breath with exertion relieved by rest.  It is for activities.  She says that otherwise she is at baseline.  Ideally she would like to do gardening but even before stopping the Spiriva this was not possible because of shortness of breath.  She says she has resigned to the fact that she is 70 and therefore cannot do those.  This time because of the worsening shortness of breath she seems more open to having Biologics but at this same time as a first step she would like to go back on Spiriva.  She feels she really needs the Spiriva along with the [facility]  Her lung function tests are shown below and it shows decline.  She has had the flu shot but plans to have the Covid booster soon.   IMPRESSION:  CT chest 1. Ongoing stability of bilateral pulmonary nodules, most consistent favoring a benign etiology. 2. Right axillary node dissection, without findings of thoracic metastatic disease. 3. Aortic atherosclerosis (ICD10-I70.0), coronary artery atherosclerosis and emphysema (ICD10-J43.9). 4. Esophageal air fluid level suggests dysmotility  or gastroesophageal reflux.     Electronically Signed   By: Abigail Miyamoto M.D.   On: 06/25/2019 13:32     OV 06/18/2020   Subjective:  Patient ID: Kalman Shan, female , DOB: July 24, 1945, age 64 y.o. years. , MRN: [id number],  ADDRESS: [place] [address] PCP  Regis Bill, Standley Brooking, MD Providers : Treatment Team:  Attending Provider: Brand Males, MD Patient Care Team: Burnis Medin, MD as PCP - Adelene Idler, MD Martinique, Amy, MD as Consulting Physician (Dermatology) Neldon Mc Donnamarie Poag, MD as Consulting Physician (Allergy and Immunology) Marygrace Drought, MD as Consulting Physician (Ophthalmology) Clent Jacks, MD as Consulting Physician (Ophthalmology) Neldon Mc, Donnamarie Poag, MD as Consulting Physician (Allergy and Immunology) Brand Males, MD as Consulting Physician (Pulmonary Disease) Thornton Park, MD as Consulting Physician (Gastroenterology)    Chief Complaint  Patient presents with   Follow-up    Asthma, doing ok    Follow-up fixed obstructive lung disease   HPI Naima Veldhuizen 75 y.o. -at last visit I was concerned that FEV1 is declining.  She wanted to add Spiriva and see how she is doing.  She comes back she says she is feeling better.  Her ACT score has improved from 20- to 22.  Not sure this is clinically significant.  Her FEV1 appears stable/slightly declined.  This is despite adding Spiriva.  However she says she feels better.  Her CBC with differential shows use no chills that are acceptable at 200 cells per cubic millimeter.  Her blood IgE is normal.    She had a CT scan of the chest -her lung nodules are stable.  The radiologist is reporting mild bronchiectasis in the upper lobe is stable.  However this the first time we are hearing this to mention in the report.  She denies any sputum production.   CLINICAL DATA:  Follow-up pulmonary nodules. History of breast cancer. Nonproductive cough. Asthma.   EXAM: CT CHEST WITHOUT CONTRAST   TECHNIQUE: Multidetector CT imaging of the chest was performed following the standard protocol without intravenous contrast. High resolution imaging of the lungs, as well as inspiratory and expiratory imaging, was performed.   COMPARISON:   06/25/2019 chest CT.   FINDINGS: Cardiovascular: Normal heart size. No significant pericardial effusion/thickening. Left anterior descending coronary atherosclerosis. Great vessels are normal in course and caliber.   Mediastinum/Nodes: No discrete thyroid nodules. Unremarkable esophagus. Surgical clips throughout the right axilla, unchanged. No pathologically enlarged axillary, mediastinal or discrete hilar nodes on these noncontrast images.   Lungs/Pleura: No pneumothorax. No pleural effusion. Numerous (greater than 20) solid pulmonary nodules scattered throughout both lungs, largest 7 mm in the anterior right lower lobe (series 3/image 106), all stable since at least 10/03/2017 CT. Sub solid 0.8 cm right upper lobe nodule (series 3/image 29), stable. No acute consolidative airspace disease, lung masses or new significant pulmonary nodules. Mild sharply marginated patchy subpleural reticulation in the anterior mid to upper right lung, stable, compatible with mild radiation fibrosis. Scattered mild cylindrical bronchiectasis throughout both lungs involving all lung lobes, most prominent in right middle and right upper lobes. No significant regions of ground-glass opacity, architectural distortion or frank honeycombing. No appreciable interval change in the bronchiectasis. No significant lobular air trapping or evidence of tracheobronchomalacia on the expiration sequence.   Upper abdomen: No acute abnormality.   Musculoskeletal: No aggressive appearing focal osseous lesions. Moderate thoracic spondylosis.   IMPRESSION: 1. Stable scattered mild cylindrical bronchiectasis throughout both lungs, most prominent  in the right middle and right upper lobes. No evidence of interstitial lung disease. 2. 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 3. Numerous subcentimeter pulmonary nodules scattered throughout both lungs, all stable since at least 2019 CT,  presumably benign. No thoracic adenopathy or other findings to suggest metastatic disease. 4. One vessel coronary atherosclerosis.  addendum from Dr Laqueta Carina - Mild tubular bronchiectasis throughout, seen since 10/03/2017, not really evident on much more remote CT dated 02/08/2005.   Electronically Signed   By: Ilona Sorrel M.D.   On: 06/11/2020 14:54      OV 10/12/2020  Subjective:  Patient ID: Kalman Shan, female , DOB: June 29, 1946 , age 45 y.o. , MRN: [id number] , ADDRESS: [place] [address] PCP Regis Bill, Standley Brooking, MD Patient Care Team: Burnis Medin, MD as PCP - Adelene Idler, MD Martinique, Amy, MD as Consulting Physician (Dermatology) Neldon Mc Donnamarie Poag, MD as Consulting Physician (Allergy and Immunology) Marygrace Drought, MD as Consulting Physician (Ophthalmology) Clent Jacks, MD as Consulting Physician (Ophthalmology) Neldon Mc, Donnamarie Poag, MD as Consulting Physician (Allergy and Immunology) Brand Males, MD as Consulting Physician (Pulmonary Disease) Thornton Park, MD as Consulting Physician (Gastroenterology)  This Provider for this visit: Treatment Team:  Attending Provider: Brand Males, MD    10/12/2020 -   Chief Complaint  Patient presents with   Follow-up    PFT performed today.  Pt states she is about the same since last visit. States she still becomes SOB with exertion.     HPI Stina Gane 75 y.o. -returns for follow-up.  Overall she is stable.  She continues to have fixed exertional dyspnea on exertion such as bending over gardening climbing stairs.  This is not any worse.  No wheezing no orthopnea no proximal nocturnal dyspnea no chest tightness no hemoptysis.  No fever chills or no weight loss.  Her pulmonary function test was continued fixed obstruction.  Prebronchodilator it is worse than before.  Postbronchodilator it appears to be in baseline.  She feels baseline.  Last visit I emailed a second radiologist asking  for opinion on bronchiectasis but that email is lost from the secure server.  I emailed the radiologist again.  Reviewed the labs indicate that there is no alpha-1 antitrypsin phenotype on the record.  She continues with Breo and Spiriva.  She feels the Spiriva is not helping her.  She is okay just doing Breo.  We discussed about second opinion at [facility] but she wants to hold off through the summer 2022 and then decide.      OV 04/19/2021  Subjective:  Patient ID: Kalman Shan, female , DOB: [birth year] , age 39 y.o. , MRN: [id number] , ADDRESS: [place] [id number] PCP Regis Bill, Standley Brooking, MD Patient Care Team: Burnis Medin, MD as PCP - Adelene Idler, MD Martinique, Amy, MD as Consulting Physician (Dermatology) Neldon Mc, Donnamarie Poag, MD as Consulting Physician (Allergy and Immunology) Marygrace Drought, MD as Consulting Physician (Ophthalmology) Clent Jacks, MD as Consulting Physician (Ophthalmology) Neldon Mc, Donnamarie Poag, MD as Consulting Physician (Allergy and Immunology) Brand Males, MD as Consulting Physician (Pulmonary Disease) Thornton Park, MD as Consulting Physician (Gastroenterology)  This Provider for this visit: Treatment Team:  Attending Provider: Brand Males, MD    04/19/2021 -   Chief Complaint  Patient presents with   Follow-up    PFT done     Follow-up with follow-up fixed obstructive lung defect with normal  DLCO  HPI Hartlee Amedee 75 y.o. -last seen in April 2022.  She tells me that she has had continuing to have progressive dyspnea on exertion.  When doing rock gardening she could do 15 minutes without a break last year.  Currently it is down to 6 or 7 minutes.  Symptom scores are worsening.  Her FEV1 appears stable although the FVC itself appears to have declined a little bit.  We still have not found an etiology for fixed obstructive lung defect.  Last visit I checked alpha-1 and this was normal.  Last visit  in April 2022 realized for the first time she had bronchiectasis reported on her radiology.  I had the radiologist go back and appears she has mild diffuse bronchiectasis since 2019 [not present on CT scan 2006].  She is not having any cough.  We discussed lung biopsy but she wants to hold off.  We discussed referral.  She is reluctantly agreed.  Discussed about visiting the bronchiectasis center at [facility].  She is agreed.  They might recommend a bronchoscopy.  She also is to review records if she has had a flu shot.  If she is not had a flu shot then she will have it today.  Lab Results  Component Value Date   NITRICOXIDE 9 04/19/2021   Asthma Control Test ACT Total Score  04/19/2021 18  10/12/2020 22  06/18/2020 22      Asthma Control Panel eos 300 always - last 2018, 100 in march 2019 and 400 in aug 2017 and 200 in oct 2021  IgE 8 - march 2019 and 13 in march 2021 12/06/2016 08/18/2017  09/22/2017  02/16/2018  03/26/2019  09/23/2019  04/16/2020  06/18/2020  04/19/2021   Current Med Regimen  anoro breo breo breo breo and spiriva     ACT _0 ACQ 5 point- 1 week. wtd avg score. <1.0 is good control 0.75-1.25 is grey zone. >1.25 poor control. Delta 0.5 is clinically meaningful  12/5 = 2.4 0.5 0.4 0.4 0.4     FeNO ppB  58  _1 FeV1   0.96L/38% in May 2018 outside 1.11/425% -> 0.79/30% ratop 65 -> 39, DLCO 18.33/61%  1.14L/43% and ratio 52  Post BD 1.01L/55%, RAt 52, DLCO 82% Post GB 0.98L/38%, Ratio55, DLCO 82%   Planned intervention  for visit  Change anoro to breo low dose breo + get HRCT + blood allergy panel -. Did only IgE - normal 8            Results for GILIANA, VANTIL (MRN [id number]) as of 04/19/2021 10:37  Ref. Range 09/22/2017 12:49 05/14/2019 10:05 04/16/2020 16:00 10/12/2020 14:05  IgE (Immunoglobulin E), Serum Latest Ref Range: <OR=114 kU/L 8  13   A-1 Antitrypsin, Ser Latest Ref Range: 83 - 199 mg/dL    114  Anti Nuclear Antibody (ANA) Latest Ref  Range: NEGATIVE   NEGATIVE    Immunoglobulin A Latest Ref Range: 70 - 320 mg/dL  263    Mitochondrial M2 Ab, IgG Latest Units: U  < OR = 20.0    IgG (Immunoglobin G), Serum Latest Ref Range: 600 - 1,540 mg/dL  881     PFT  PFT Results Latest Ref Rng & Units 04/19/2021 10/12/2020 06/18/2020 04/16/2020 11/29/2019 03/26/2019 09/22/2017  FVC-Pre L 1.88 1.93 2.05 2.02 2.29 2.17 2.24  FVC-Predicted Pre % 55 55 59 58 66 62 65  FVC-Post L 1.84 2.05 1.76 1.92 - - 2.05  FVC-Predicted Post % 54 59 51 55 - - 59  Pre FEV1/FVC % % 51 49 56 54 55 52 49  Post FEV1/FCV % % 55 60 55 52 - - 39  FEV1-Pre L 0.96 0.94 1.16 1.10 1.26 1.14 1.11  FEV1-Predicted Pre % 37 35 44 42 48 43 42  FEV1-Post L 1.01 1.23 0.98 1.01 - - 0.79  DLCO uncorrected ml/min/mmHg 18.39 - 18.60 18.94 16.30 17.13 18.33  DLCO UNC% % 81 - 82 84 72 75 61  DLCO corrected ml/min/mmHg 18.39 - 18.60 18.83 16.30 16.78 -  DLCO COR %Predicted % 81 - 82 83 72 74 -  DLVA Predicted % 136 - 134 137 110 132 96  TLC L - - - - - - 6.80  TLC % Predicted % - - - - - - 119  RV % Predicted % - - - - - - 181       has a past medical history of Anxiety, Asthma, Breast cancer (HCC), Cataract, Chronic airway obstruction, not elsewhere classified, Coronary artery calcification seen on CAT scan (01/23/2018), Diverticulosis, Dysplasia, Fatty liver, Fibromyalgia, nonmelanoma skin cancer, Hyperlipidemia, Hypothyroidism, Malignant neoplasm of breast (female), unspecified site (11/28/2013), Osteoarthritis, Osteopenia, REM sleep behavior disorder, and Skin cancer.   reports that she quit smoking about 46 years ago. Her smoking use included cigarettes. She started smoking about 58 years ago. She has a 10.00 pack-year smoking history. She has never used smokeless tobacco.  Past Surgical History:  Procedure Laterality Date   BREAST LUMPECTOMY     on right, 1990 IIA radiation cmfp ajuction rx   COLONOSCOPY     KNEE SURGERY  2012   NASAL SINUS SURGERY  2007   on right  done by Dr. Wilburn Cornelia    Allergies  Allergen Reactions   Hydrocodone Itching    Immunization History  Administered Date(s) Administered   Fluad Quad(high Dose 65+) 03/12/2019, 03/30/2020, 04/19/2021   Hepatitis A 08/30/2017   Influenza Whole 04/10/2002, 06/01/2009, 05/05/2010, 05/22/2012, 02/20/2017   Influenza, High Dose Seasonal PF 04/22/2016   Influenza,inj,Quad PF,6+ Mos 06/04/2013   Influenza-Unspecified 05/23/2011   PFIZER(Purple Top)SARS-COV-2 Vaccination 08/03/2019, 08/24/2019, 04/22/2020   Pneumococcal Conjugate-13 10/09/2013   Pneumococcal Polysaccharide-23 05/05/2010   Td 08/11/2006, 02/27/2017   Typhoid Inactivated 09/07/2017   Zoster Recombinat (Shingrix) 06/06/2018, 03/14/2019   Zoster, Live 09/11/2012    Family History  Problem Relation Age of Onset   Other Father        mysthenia gravis   Lung cancer Sister    Osteoporosis Other    Thyroid disease Other    Breast cancer Mother 66       possible uterine cancer diagnosis at age 32   Osteoporosis Mother        no hip fracture   Breast cancer Sister 109   Lung cancer Sister 80       former smoker   Lung cancer Paternal Grandmother    Colon cancer Neg Hx    Esophageal cancer Neg Hx    Stomach cancer Neg Hx    Rectal cancer Neg Hx      Current Outpatient Medications:    acetaminophen (TYLENOL) 500 MG tablet, Take 1,000 mg by mouth daily as needed. pain, Disp: , Rfl:    albuterol (VENTOLIN HFA) 108 (90 Base) MCG/ACT inhaler, Inhale 2 puffs into the lungs every 6 (six) hours as needed for wheezing or shortness of breath., Disp: 8 g,  Rfl: 5   atorvastatin (LIPITOR) 20 MG tablet, TAKE 1 TABLET EVERY DAY, Disp: 90 tablet, Rfl: 0   BREO ELLIPTA 100-25 MCG/INH AEPB, INHALE 1 PUFF ONE TIME DAILY, Disp: 180 each, Rfl: 3   Cholecalciferol (VITAMIN D3) 50 MCG (2000 UT) TABS, Take by mouth daily., Disp: , Rfl:    diphenhydramine-acetaminophen (TYLENOL PM) 25-500 MG TABS, Take 1 tablet by mouth at bedtime as needed.  sleep, Disp: , Rfl:    PARoxetine (PAXIL) 40 MG tablet, TAKE 1 TABLET AT BEDTIME, Disp: 90 tablet, Rfl: 0   SYNTHROID 88 MCG tablet, TAKE 1 TABLET EVERY DAY, Disp: 90 tablet, Rfl: 0   valACYclovir (VALTREX) 1000 MG tablet, TAKE 2 TABLETS BY MOUTH TWICE DAILY AS DIRECTED, Disp: 90 tablet, Rfl: prn   vitamin B-12 (CYANOCOBALAMIN) 1000 MCG tablet, Take 1,000 mcg by mouth daily., Disp: , Rfl:    vitamin C (ASCORBIC ACID) 500 MG tablet, Take 500 mg by mouth daily., Disp: , Rfl:   Current Facility-Administered Medications:    0.9 %  sodium chloride infusion, 500 mL, Intravenous, Once, [place], Joelene Millin, MD   0.9 %  sodium chloride infusion, 500 mL, Intravenous, Once, [place], MD      Objective:   Vitals:   04/19/21 1012  BP: 138/78  Pulse: 81  Temp: (!) 97.3 F (36.3 C)  SpO2: 97%  Weight: 163 lb (73.9 kg)  Height: _0  (1.6 m)    Estimated body mass index is 28.87 kg/m as calculated from the following:   Height as of this encounter: _1  (1.6 m).   Weight as of this encounter: 163 lb (73.9 kg).  _2 @  Filed Weights   04/19/21 1012  Weight: 163 lb (73.9 kg)     Physical Exam  General: No distress. Looks well Neuro: Alert and Oriented x 3. GCS 15. Speech normal Psych: Pleasant Resp:  Barrel Chest - no.  Wheeze - no, Crackles - no, No overt respiratory distress CVS: Normal heart sounds. Murmurs - no Ext: Stigmata of Connective Tissue Disease - no HEENT: Normal upper airway. PEERL +. No post nasal drip        Assessment:       ICD-10-CM   1. History of bronchiectasis  Z87.09 Ambulatory referral to Pulmonology    2. Moderate persistent asthma without complication  M09.47 Ambulatory referral to Pulmonology    3. Need for immunization against influenza  Z23 Flu Vaccine QUAD High Dose(Fluad)         Plan:     Patient Instructions  Mild difffuse tubular bronchiectasis 2019> dec 2021 (most prominent Right Upper lobe and right middle lobe)  -  breathing test With severe  fixed lung obstruction  and no reason other than bronchiectasisis +/- asthma found - subjectively slowly getting worse  Plan  --Continue  albuterol as needed - you do need rescue inhaler - continue breo as before  -continue  Spiriva 2 puff daily  -refer [facility]  - flu shot 04/19/2021 - if records from PCP Panosh, Standley Brooking, MD show you have not had one for 2022-2023 season   Multiple lung nodules on CT  - largest in 8mm Right upper lobe -stable march -> july 2019 ->July 2020 -> dec 2020 -. Dec 2021 -  Plan -no further folowup  Fluid level in esophagus - seeon CT dec 2020   - per your report endoscopy by Dr BTarri Glenn 2021 without adversity  Plan   - PPI per GI  Followup 6 months or sooner with Dr Gaye Alken    Dr. Brand Males, M.D., F.C.C.P,  Pulmonary and Critical Care Medicine Staff Physician, [place] Director - Interstitial Lung Disease  Program  [facility] [place], [place], [address]  Pager: [phone], If no answer or between  15:00h - 7:00h: call [phone] Telephone: [phone]  11:07 AM 04/19/2021

## 2021-04-19 NOTE — Patient Instructions (Addendum)
Spirometry pre/post and DLCO performed today. 

## 2021-05-31 DIAGNOSIS — D044 Carcinoma in situ of skin of scalp and neck: Secondary | ICD-10-CM | POA: Diagnosis not present

## 2021-05-31 DIAGNOSIS — L308 Other specified dermatitis: Secondary | ICD-10-CM | POA: Diagnosis not present

## 2021-05-31 DIAGNOSIS — D485 Neoplasm of uncertain behavior of skin: Secondary | ICD-10-CM | POA: Diagnosis not present

## 2021-05-31 DIAGNOSIS — Z85828 Personal history of other malignant neoplasm of skin: Secondary | ICD-10-CM | POA: Diagnosis not present

## 2021-06-08 ENCOUNTER — Telehealth (INDEPENDENT_AMBULATORY_CARE_PROVIDER_SITE_OTHER): Payer: Medicare PPO | Admitting: Family Medicine

## 2021-06-08 ENCOUNTER — Encounter: Payer: Self-pay | Admitting: Family Medicine

## 2021-06-08 DIAGNOSIS — U071 COVID-19: Secondary | ICD-10-CM

## 2021-06-08 MED ORDER — MOLNUPIRAVIR EUA 200MG CAPSULE: 4.0000 | ORAL_CAPSULE | Freq: Two times a day (BID) | ORAL | 0 refills | Status: AC

## 2021-06-08 NOTE — Progress Notes (Signed)
Virtual Visit via Video Note  I connected with Jasmine Neal  on 06/08/21 at  4:00 PM EST by a video enabled telemedicine application and verified that I am speaking with the correct person using two identifiers.  Location patient: home, [place] Location provider:work or home office Persons participating in the virtual visit: patient, provider  I discussed the limitations of evaluation and management by telemedicine and the availability of in person appointments. The patient expressed understanding and agreed to proceed.   HPI:  Acute telemedicine visit for Covid19: -Onset:3-4 days; had a positive covid test -Symptoms include:fatigue, sore throat, cough, headache, nasal congestion -Denies:CP, SOB, fever, NVD, inability to eat/drink/get out of bed -Pertinent past medical history: see below -Pertinent medication allergies:  Allergies  Allergen Reactions   Hydrocodone Itching  -COVID-19 vaccine status:  Immunization History  Administered Date(s) Administered   Fluad Quad(high Dose 65+) 03/12/2019, 03/30/2020, 04/19/2021   Hepatitis A 08/30/2017   Influenza Whole 04/10/2002, 06/01/2009, 05/05/2010, 05/22/2012, 02/20/2017   Influenza, High Dose Seasonal PF 04/22/2016   Influenza,inj,Quad PF,6+ Mos 06/04/2013   Influenza-Unspecified 05/23/2011   PFIZER(Purple Top)SARS-COV-2 Vaccination 08/03/2019, 08/24/2019, 04/22/2020   Pneumococcal Conjugate-13 10/09/2013   Pneumococcal Polysaccharide-23 05/05/2010   Td 08/11/2006, 02/27/2017   Typhoid Inactivated 09/07/2017   Zoster Recombinat (Shingrix) 06/06/2018, 03/14/2019   Zoster, Live 09/11/2012  -no recent labs in the last year, prefers to not go get labs  ROS: See pertinent positives and negatives per HPI.  Past Medical History:  Diagnosis Date   Anxiety    Asthma    evalutated by pulmonary severe by spirometry no smoker currently   Breast cancer ([place])    hx of right lumpectomy, 1990 IIA radiation cmfp ajunct rx   Cataract    removed from  both eyes   Chronic airway obstruction, not elsewhere classified    Coronary artery calcification seen on CAT scan 01/23/2018   Diverticulosis    only has like 2   Dysplasia    hx treated with cerv conization   Fatty liver    US done 2010   Fibromyalgia    Hx of nonmelanoma skin cancer     followed dr [place]    Hyperlipidemia    Hypothyroidism    Malignant neoplasm of breast (female), unspecified site 11/28/2013   Osteoarthritis    Osteopenia    REM sleep behavior disorder    Skin cancer    squamous cell    Past Surgical History:  Procedure Laterality Date   BREAST LUMPECTOMY     on right, 1990 IIA radiation cmfp ajuction rx   COLONOSCOPY     KNEE SURGERY  2012   NASAL SINUS SURGERY  2007   on right done by Dr. Wilburn Cornelia     Current Outpatient Medications:    acetaminophen (TYLENOL) 500 MG tablet, Take 1,000 mg by mouth daily as needed. pain, Disp: , Rfl:    albuterol (VENTOLIN HFA) 108 (90 Base) MCG/ACT inhaler, Inhale 2 puffs into the lungs every 6 (six) hours as needed for wheezing or shortness of breath., Disp: 8 g, Rfl: 5   atorvastatin (LIPITOR) 20 MG tablet, TAKE 1 TABLET EVERY DAY, Disp: 90 tablet, Rfl: 0   BREO ELLIPTA 100-25 MCG/INH AEPB, INHALE 1 PUFF ONE TIME DAILY, Disp: 180 each, Rfl: 3   Cholecalciferol (VITAMIN D3) 50 MCG (2000 UT) TABS, Take by mouth daily., Disp: , Rfl:    diphenhydramine-acetaminophen (TYLENOL PM) 25-500 MG TABS, Take 1 tablet by mouth at bedtime as needed. sleep, Disp: ,  Rfl:    molnupiravir EUA (LAGEVRIO) 200 mg CAPS capsule, Take 4 capsules (800 mg total) by mouth 2 (two) times daily for 5 days., Disp: 40 capsule, Rfl: 0   PARoxetine (PAXIL) 40 MG tablet, TAKE 1 TABLET AT BEDTIME, Disp: 90 tablet, Rfl: 0   SYNTHROID 88 MCG tablet, TAKE 1 TABLET EVERY DAY, Disp: 90 tablet, Rfl: 0   valACYclovir (VALTREX) 1000 MG tablet, TAKE 2 TABLETS BY MOUTH TWICE DAILY AS DIRECTED, Disp: 90 tablet, Rfl: prn   vitamin B-12 (CYANOCOBALAMIN) 1000 MCG  tablet, Take 1,000 mcg by mouth daily., Disp: , Rfl:    vitamin C (ASCORBIC ACID) 500 MG tablet, Take 500 mg by mouth daily., Disp: , Rfl:   Current Facility-Administered Medications:    0.9 %  sodium chloride infusion, 500 mL, Intravenous, Once, [place], MD   0.9 %  sodium chloride infusion, 500 mL, Intravenous, Once, [place], MD  EXAM:  VITALS per patient if applicable:  GENERAL: alert, oriented, appears well and in no acute distress  HEENT: atraumatic, conjunttiva clear, no obvious abnormalities on inspection of external nose and ears  NECK: normal movements of the head and neck  LUNGS: on inspection no signs of respiratory distress, breathing rate appears normal, no obvious gross SOB, gasping or wheezing  CV: no obvious cyanosis  MS: moves all visible extremities without noticeable abnormality  PSYCH/NEURO: pleasant and cooperative, no obvious depression or anxiety, speech and thought processing grossly intact  ASSESSMENT AND PLAN:  Discussed the following assessment and plan:  COVID-19   Discussed treatment options and risk of drug interactions, ideal treatment window, potential complications, isolation and precautions for COVID-19.  Discussed possibility of rebound with antivirals and the need to reisolate if it should occur for 5 days. Checked for/reviewed any labs done in the last 90 days with GFR listed in HPI if available.  After lengthy discussion, the patient opted for treatment with Legevrio due to being higher risk for complications of covid or severe disease and other factors. Discussed EUA status of this drug and the fact that there is preliminary limited knowledge of risks/interactions/side effects per EUA document vs possible benefits and precautions. This information was shared with patient during the visit and also was provided in patient instructions.Other symptomatic care measures summarized in patient instructions.Advised to seek prompt in  person care if worsening, new symptoms arise, or if is not improving with treatment. Discussed options for inperson care if PCP office not available. .   I discussed the assessment and treatment plan with the patient. The patient was provided an opportunity to ask questions and all were answered. The patient agreed with the plan and demonstrated an understanding of the instructions.     Lucretia Kern, DO

## 2021-06-08 NOTE — Patient Instructions (Addendum)
HOME CARE TIPS:  -I sent the medication(s) we discussed to your pharmacy: Meds ordered this encounter  Medications   molnupiravir EUA (LAGEVRIO) 200 mg CAPS capsule    Sig: Take 4 capsules (800 mg total) by mouth 2 (two) times daily for 5 days.    Dispense:  40 capsule    Refill:  0      -I sent in the [facility] treatment or referral you requested per our discussion. Please see the information provided below and discuss further with the pharmacist/treatment team.   -there is a chance of rebound illness after finishing your treatment. If you become sick again please isolate for an additional 5 days, plus 5 more days of masking.   -can use tylenol if needed for fevers, aches and pains per instructions  -can use nasal saline a few times per day if you have nasal congestion  -stay hydrated, drink plenty of fluids and eat small healthy meals - avoid dairy  -follow up with your doctor in 2-3 days unless improving and feeling better  -stay home while sick, except to seek medical care. If you have COVID19, ideally it would be best to stay home for a full 10 days since the onset of symptoms PLUS one day of no fever and feeling better. Wear a good mask that fits snugly (such as N95 or KN95) if around others to reduce the risk of transmission.  It was nice to meet you today, and I really hope you are feeling better soon. I help [facility] out with telemedicine visits on Tuesdays and Thursdays and am available for visits on those days. If you have any concerns or questions following this visit please schedule a follow up visit with your Primary Care doctor or seek care at a local urgent care clinic to avoid delays in care.    Seek in person care or schedule a follow up video visit promptly if your symptoms worsen, new concerns arise or you are not improving with treatment. Call 911 and/or seek emergency care if your symptoms are severe or life threatening.    Fact Sheet for Patients And  Caregivers Emergency Use Authorization (EUA) Of LAGEVRIOT (molnupiravir) capsules For Coronavirus Disease 2019 (COVID-19)  What is the most important information I should know about LAGEVRIO? LAGEVRIO may cause serious side effects, including: ? LAGEVRIO may cause harm to your unborn baby. It is not known if LAGEVRIO will harm your baby if you take LAGEVRIO during pregnancy. o LAGEVRIO is not recommended for use in pregnancy. o LAGEVRIO has not been studied in pregnancy. LAGEVRIO was studied in pregnant animals only. When LAGEVRIO was given to pregnant animals, LAGEVRIO caused harm to their unborn babies. o You and your healthcare provider may decide that you should take LAGEVRIO during pregnancy if there are no other COVID-19 treatment options approved or authorized by the FDA that are accessible or clinically appropriate for you. o If you and your healthcare provider decide that you should take LAGEVRIO during pregnancy, you and your healthcare provider should discuss the known and potential benefits and the potential risks of taking LAGEVRIO during pregnancy. For individuals who are able to become pregnant: ? You should use a reliable method of birth control (contraception) consistently and correctly during treatment with LAGEVRIO and for 4 days after the last dose of LAGEVRIO. Talk to your healthcare provider about reliable birth control methods. ? Before starting treatment with [facility] your healthcare provider may do a pregnancy test to see if you are pregnant  before starting treatment with LAGEVRIO. ? Tell your healthcare provider right away if you become pregnant or think you may be pregnant during treatment with LAGEVRIO. Pregnancy Surveillance Program: ? There is a pregnancy surveillance program for individuals who take LAGEVRIO during pregnancy. The purpose of this program is to collect information about the health of you and your baby. Talk to your healthcare provider about  how to take part in this program. ? If you take LAGEVRIO during pregnancy and you agree to participate in the pregnancy surveillance program and allow your healthcare provider to share your information with [place], then your healthcare provider will report your use of [place] during pregnancy to [place]. by calling [phone] or [email or web address]. For individuals who are sexually active with partners who are able to become pregnant: ? It is not known if LAGEVRIO can affect sperm. While the risk is regarded as low, animal studies to fully assess the potential for LAGEVRIO to affect the babies of males treated with LAGEVRIO have not been completed. A reliable method of birth control (contraception) should be used consistently and correctly during treatment with LAGEVRIO and for at least 3 months after the last dose. The risk to sperm beyond 3 months is not known. Studies to understand the risk to sperm beyond 3 months are ongoing. Talk to your healthcare provider about reliable birth control methods. Talk to your healthcare provider if you have questions or concerns about how LAGEVRIO may affect sperm. You are being given this fact sheet because your healthcare provider believes it is necessary to provide you with LAGEVRIO for the treatment of adults with mild-to-moderate coronavirus disease 2019 (COVID-19) with positive results of direct SARS-CoV-2 viral testing, and who are at high risk for progression to severe COVID-19 including hospitalization or death, and for whom other COVID-19 treatment options approved or authorized by the FDA are not accessible or clinically appropriate. The U.S. Food and Drug Administration (FDA) has issued an Emergency Use Authorization (EUA) to make LAGEVRIO available during the COVID-19 pandemic (for more details about an EUA please see "What is an Emergency Use Authorization?" at the end of this document). LAGEVRIO is  not an FDA-approved medicine in the [place]. Read this Fact Sheet for information about LAGEVRIO. Talk to your healthcare provider about your options if you have any questions. It is your choice to take LAGEVRIO.  What is COVID-19? COVID-19 is caused by a virus called a coronavirus. You can get COVID-19 through close contact with another person who has the virus. COVID-19 illnesses have ranged from very mild-to-severe, including illness resulting in death. While information so far suggests that most COVID-19 illness is mild, serious illness can happen and may cause some of your other medical conditions to become worse. Older people and people of all ages with severe, long lasting (chronic) medical conditions like heart disease, lung disease and diabetes, for example seem to be at higher risk of being hospitalized for COVID-19.  What is LAGEVRIO? LAGEVRIO is an investigational medicine used to treat mild-to-moderate COVID-19 in adults: ? with positive results of direct SARS-CoV-2 viral testing, and ? who are at high risk for progression to severe COVID-19 including hospitalization or death, and for whom other COVID-19 treatment options approved or authorized by the FDA are not accessible or clinically appropriate. The FDA has authorized the emergency use of LAGEVRIO for the treatment of mild-tomoderate COVID-19 in adults under an EUA. For more information on EUA, see the "What is  an Emergency Use Authorization (EUA)?" section at the end of this Fact Sheet. LAGEVRIO is not authorized: ? for use in people less than 79 years of age. ? for prevention of COVID-19. ? for people needing hospitalization for COVID-19. ? for use for longer than 5 consecutive days.  What should I tell my healthcare provider before I take LAGEVRIO? Tell your healthcare provider if you: ? Have any allergies ? Are breastfeeding or plan to breastfeed ? Have any serious illnesses ? Are taking any medicines  (prescription, over-the-counter, vitamins, or herbal products).  How do I take LAGEVRIO? ? Take LAGEVRIO exactly as your healthcare provider tells you to take it. ? Take 4 capsules of LAGEVRIO every 12 hours (for example, at 8 am and at 8 pm) ? Take LAGEVRIO for 5 days. It is important that you complete the full 5 days of treatment with LAGEVRIO. Do not stop taking LAGEVRIO before you complete the full 5 days of treatment, even if you feel better. ? Take LAGEVRIO with or without food. ? You should stay in isolation for as long as your healthcare provider tells you to. Talk to your healthcare provider if you are not sure about how to properly isolate while you have COVID-19. ? Swallow LAGEVRIO capsules whole. Do not open, break, or crush the capsules. If you cannot swallow capsules whole, tell your healthcare provider. ? What to do if you miss a dose: o If it has been less than 10 hours since the missed dose, take it as soon as you remember o If it has been more than 10 hours since the missed dose, skip the missed dose and take your dose at the next scheduled time. ? Do not double the dose of LAGEVRIO to make up for a missed dose.  What are the important possible side effects of LAGEVRIO? ? See, "What is the most important information I should know about LAGEVRIO?" ? Allergic Reactions. Allergic reactions can happen in people taking LAGEVRIO, even after only 1 dose. Stop taking LAGEVRIO and call your healthcare provider right away if you get any of the following symptoms of an allergic reaction: o hives o rapid heartbeat o trouble swallowing or breathing o swelling of the mouth, lips, or face o throat tightness o hoarseness o skin rash The most common side effects of LAGEVRIO are: ? diarrhea ? nausea ? dizziness These are not all the possible side effects of LAGEVRIO. Not many people have taken LAGEVRIO. Serious and unexpected side effects may happen. This medicine is still  being studied, so it is possible that all of the risks are not known at this time.  What other treatment choices are there?  Veklury (remdesivir) is FDA-approved as an intravenous (IV) infusion for the treatment of mildto-moderate QQIWL-79 in certain adults and children. Talk with your doctor to see if Marijean Heath is appropriate for you. Like LAGEVRIO, FDA may also allow for the emergency use of other medicines to treat people with COVID-19. Go to [email or web address] for more information. It is your choice to be treated or not to be treated with LAGEVRIO. Should you decide not to take it, it will not change your standard medical care.  What if I am breastfeeding? Breastfeeding is not recommended during treatment with LAGEVRIO and for 4 days after the last dose of LAGEVRIO. If you are breastfeeding or plan to breastfeed, talk to your healthcare provider about your options and specific situation before taking LAGEVRIO.  How do I report side effects with LAGEVRIO?  Contact your healthcare provider if you have any side effects that bother you or do not go away. Report side effects to FDA MedWatch at SmoothHits.hu or call 1-800-FDA-1088 (1- 712-484-9943).  How should I store [place]? ? Store LAGEVRIO capsules at room temperature between 27F to 62F (20C to 25C). ? Keep LAGEVRIO and all medicines out of the reach of children and pets. How can I learn more about COVID-19? ? Ask your healthcare provider. ? Visit [email or web address] ? Contact your local or state public health department. ? Call [place] at [phone] (toll free in the [place] ? Visit [email or web address]  What Is an Emergency Use Authorization (EUA)? The [place] FDA has made [place] available under an emergency access mechanism called an Emergency Use Authorization (EUA) The EUA is supported by  a [place], [place] Health and Human Service (HHS) declaration that circumstances exist to justify emergency use of drugs and biological products during the COVID-19 pandemic. LAGEVRIO for the treatment of mild-to-moderate COVID-19 in adults with positive results of direct SARS-CoV-2 viral testing, who are at high risk for progression to severe COVID-19, including hospitalization or death, and for whom alternative COVID-19 treatment options approved or authorized by FDA are not accessible or clinically appropriate, has not undergone the same type of review as an FDA-approved product. In issuing an EUA under the QIWLN-98 public health emergency, the FDA has determined, among other things, that based on the total amount of scientific evidence available including data from adequate and well-controlled clinical trials, if available, it is reasonable to believe that the product may be effective for diagnosing, treating, or preventing COVID-19, or a serious or life-threatening disease or condition caused by COVID-19; that the known and potential benefits of the product, when used to diagnose, treat, or prevent such disease or condition, outweigh the known and potential risks of such product; and that there are no adequate, approved, and available alternatives.  All of these criteria must be met to allow for the product to be used in the treatment of patients during the COVID-19 pandemic. The EUA for LAGEVRIO is in effect for the duration of the COVID-19 declaration justifying emergency use of LAGEVRIO, unless terminated or revoked (after which LAGEVRIO may no longer be used under the EUA). For patent information: http://rogers.info/ Copyright  2021-2022 [employer] [place], [place] and its affiliates. All rights reserved. [id number] Revised: March 2022

## 2021-06-24 ENCOUNTER — Telehealth: Payer: Self-pay | Admitting: Internal Medicine

## 2021-06-24 DIAGNOSIS — E7800 Pure hypercholesterolemia, unspecified: Secondary | ICD-10-CM

## 2021-06-24 DIAGNOSIS — Z79899 Other long term (current) drug therapy: Secondary | ICD-10-CM

## 2021-06-24 DIAGNOSIS — J4540 Moderate persistent asthma, uncomplicated: Secondary | ICD-10-CM

## 2021-06-24 DIAGNOSIS — E039 Hypothyroidism, unspecified: Secondary | ICD-10-CM

## 2021-06-24 DIAGNOSIS — I2510 Atherosclerotic heart disease of native coronary artery without angina pectoris: Secondary | ICD-10-CM

## 2021-06-24 NOTE — Telephone Encounter (Signed)
Pt has cpe sch for 07-21-2021 and would like cpe labs in advance if ok please put order in

## 2021-06-24 NOTE — Telephone Encounter (Signed)
° °  Orders placed to be done pre   visit  fasting  preferred

## 2021-06-25 NOTE — Telephone Encounter (Signed)
Pt informed and pt added to lab schedule

## 2021-07-14 ENCOUNTER — Other Ambulatory Visit (INDEPENDENT_AMBULATORY_CARE_PROVIDER_SITE_OTHER): Payer: Medicare PPO

## 2021-07-14 DIAGNOSIS — Z0000 Encounter for general adult medical examination without abnormal findings: Secondary | ICD-10-CM | POA: Diagnosis not present

## 2021-07-14 DIAGNOSIS — E7800 Pure hypercholesterolemia, unspecified: Secondary | ICD-10-CM | POA: Diagnosis not present

## 2021-07-14 DIAGNOSIS — E039 Hypothyroidism, unspecified: Secondary | ICD-10-CM | POA: Diagnosis not present

## 2021-07-14 LAB — COMPREHENSIVE METABOLIC PANEL
ALT: 24 U/L (ref 0–35)
AST: 21 U/L (ref 0–37)
Albumin: 4 g/dL (ref 3.5–5.2)
Alkaline Phosphatase: 79 U/L (ref 39–117)
BUN: 22 mg/dL (ref 6–23)
CO2: 29 mEq/L (ref 19–32)
Calcium: 9.7 mg/dL (ref 8.4–10.5)
Chloride: 104 mEq/L (ref 96–112)
Creatinine, Ser: 0.91 mg/dL (ref 0.40–1.20)
GFR: 61.22 mL/min (ref >60.00)
Glucose, Bld: 87 mg/dL (ref 70–99)
Potassium: 4 mEq/L (ref 3.5–5.1)
Sodium: 140 mEq/L (ref 135–145)
Total Bilirubin: 0.9 mg/dL (ref 0.2–1.2)
Total Protein: 6.8 g/dL (ref 6.0–8.3)

## 2021-07-14 LAB — CBC WITH DIFFERENTIAL/PLATELET
Basophils Absolute: 0 10*3/uL (ref 0.0–0.1)
Basophils Relative: 0.7 % (ref 0.0–3.0)
Eosinophils Absolute: 0.3 10*3/uL (ref 0.0–0.7)
Eosinophils Relative: 5.8 % — ABNORMAL HIGH (ref 0.0–5.0)
HCT: 41.9 % (ref 36.0–46.0)
Hemoglobin: 13.6 g/dL (ref 12.0–15.0)
Lymphocytes Relative: 32.6 % (ref 12.0–46.0)
Lymphs Abs: 1.5 10*3/uL (ref 0.7–4.0)
MCHC: 32.5 g/dL (ref 30.0–36.0)
MCV: 93.3 fl (ref 78.0–100.0)
Monocytes Absolute: 0.4 10*3/uL (ref 0.1–1.0)
Monocytes Relative: 8.2 % (ref 3.0–12.0)
Neutro Abs: 2.5 10*3/uL (ref 1.4–7.7)
Neutrophils Relative %: 52.7 % (ref 43.0–77.0)
Platelets: 176 10*3/uL (ref 150.0–400.0)
RBC: 4.49 Mil/uL (ref 3.87–5.11)
RDW: 13.7 % (ref 11.5–15.5)
WBC: 4.7 10*3/uL (ref 4.0–10.5)

## 2021-07-14 LAB — HEPATIC FUNCTION PANEL
ALT: 24 U/L (ref 0–35)
AST: 21 U/L (ref 0–37)
Albumin: 4 g/dL (ref 3.5–5.2)
Alkaline Phosphatase: 79 U/L (ref 39–117)
Bilirubin, Direct: 0.1 mg/dL (ref 0.0–0.3)
Total Bilirubin: 0.9 mg/dL (ref 0.2–1.2)
Total Protein: 6.8 g/dL (ref 6.0–8.3)

## 2021-07-14 LAB — LIPID PANEL
Cholesterol: 150 mg/dL (ref 0–200)
HDL: 56.8 mg/dL (ref >39.00)
LDL Cholesterol: 81 mg/dL (ref 0–99)
NonHDL: 93.44
Total CHOL/HDL Ratio: 3
Triglycerides: 62 mg/dL (ref 0.0–149.0)
VLDL: 12.4 mg/dL (ref 0.0–40.0)

## 2021-07-14 LAB — HEMOGLOBIN A1C: Hgb A1c MFr Bld: 5.6 % (ref 4.6–6.5)

## 2021-07-14 LAB — TSH: TSH: 0.91 u[IU]/mL (ref 0.35–5.50)

## 2021-07-18 NOTE — Progress Notes (Signed)
Labs stable will review at  upcoming  appt

## 2021-07-21 ENCOUNTER — Ambulatory Visit (INDEPENDENT_AMBULATORY_CARE_PROVIDER_SITE_OTHER): Payer: Medicare PPO | Admitting: Internal Medicine

## 2021-07-21 ENCOUNTER — Encounter: Payer: Self-pay | Admitting: Internal Medicine

## 2021-07-21 VITALS — BP 166/90 | HR 76 | Temp 98.1°F | Ht 68.5 in | Wt 156.8 lb

## 2021-07-21 DIAGNOSIS — I2510 Atherosclerotic heart disease of native coronary artery without angina pectoris: Secondary | ICD-10-CM | POA: Diagnosis not present

## 2021-07-21 DIAGNOSIS — E7800 Pure hypercholesterolemia, unspecified: Secondary | ICD-10-CM | POA: Diagnosis not present

## 2021-07-21 DIAGNOSIS — R042 Hemoptysis: Secondary | ICD-10-CM

## 2021-07-21 DIAGNOSIS — R030 Elevated blood-pressure reading, without diagnosis of hypertension: Secondary | ICD-10-CM | POA: Diagnosis not present

## 2021-07-21 DIAGNOSIS — Z0000 Encounter for general adult medical examination without abnormal findings: Secondary | ICD-10-CM

## 2021-07-21 DIAGNOSIS — Z8616 Personal history of COVID-19: Secondary | ICD-10-CM | POA: Diagnosis not present

## 2021-07-21 DIAGNOSIS — Z79899 Other long term (current) drug therapy: Secondary | ICD-10-CM | POA: Diagnosis not present

## 2021-07-21 DIAGNOSIS — Z8269 Family history of other diseases of the musculoskeletal system and connective tissue: Secondary | ICD-10-CM

## 2021-07-21 DIAGNOSIS — E039 Hypothyroidism, unspecified: Secondary | ICD-10-CM | POA: Diagnosis not present

## 2021-07-21 DIAGNOSIS — Z853 Personal history of malignant neoplasm of breast: Secondary | ICD-10-CM | POA: Diagnosis not present

## 2021-07-21 DIAGNOSIS — R350 Frequency of micturition: Secondary | ICD-10-CM

## 2021-07-21 MED ORDER — SYNTHROID 88 MCG PO TABS
88.0000 ug | ORAL_TABLET | Freq: Every day | ORAL | 3 refills | Status: DC
Start: 2021-07-21 — End: 2022-07-20

## 2021-07-21 MED ORDER — ATORVASTATIN CALCIUM 20 MG PO TABS: 20.0000 mg | ORAL_TABLET | Freq: Every day | ORAL | 3 refills | Status: DC

## 2021-07-21 MED ORDER — PAROXETINE HCL 40 MG PO TABS
40.0000 mg | ORAL_TABLET | Freq: Every day | ORAL | 3 refills | Status: DC
Start: 2021-07-21 — End: 2022-07-20

## 2021-07-21 NOTE — Patient Instructions (Addendum)
You can try the myrbetric  to see if helps . Urinary frequency .   Continue lifestyle intervention healthy eating and exercise . Share information  with pulmonary team   Can ask the [facility] team about poss of autoimmune lung disease .  Yearly labs and check  Bp goal 130 and below range is best.  LDL best at 70 but  within range

## 2021-07-21 NOTE — Progress Notes (Signed)
Chief Complaint  Patient presents with   Annual Exam    HPI: Patient  Jasmine Neal  76 y.o. comes in today for Preventive Health Care visit  Has some observations and ?s   Hx of breast cancer yearly mammos CAC calcification on atorva  Thyroid meds need refill  Pulm  "copd" asthma  bronchiectasis  see s dr Chase Caller   referring to [facility]  to get HR CT  endoscopy  gi helped  some in rep.    Had covid 29 May 2021 Paxil helps thinks helps her sleep issue   ocass fall  taking tylenol pm  Daughter ex with sjogrens  eye and throat  wonders if her problems could be same or autoimmune   Urinary frquency day and night   didn't try mirbetriq but got it filled Squamous cell removal  face   Has had recurrent left nasal  blood  uses kleenex to stop  netti pot and salin some    ent in past says not  lesion. Has pix opf coughing up brblood in am  not sure if from nose or lungs.  Has happened x 3  Health Maintenance  Topic Date Due   COVID-19 Vaccine (4 - Booster for Pfizer series) 08/08/2021 (Originally 06/17/2020)   TETANUS/TDAP  02/28/2027   Pneumonia Vaccine 45+ Years old  Completed   INFLUENZA VACCINE  Completed   DEXA SCAN  Completed   Hepatitis C Screening  Completed   Zoster Vaccines- Shingrix  Completed   HPV VACCINES  Aged Out   COLONOSCOPY (Pts 45-73yrs Insurance coverage will need to be confirmed)  Discontinued   Health Maintenance Review LIFESTYLE:  Exercise:  walks dog ride bike  Tobacco/ETS:n Alcohol: ave 2 per night  Sugar beverages: nothing regular.  Sleep: 8 most time  Drug use: no HH of  1 1 dog   Fell out of bed once.  ROS:  REST of 12 system review negative except as per HPI   Past Medical History:  Diagnosis Date   Anxiety    Asthma    evalutated by pulmonary severe by spirometry no smoker currently   Breast cancer ([place])    hx of right lumpectomy, 1990 IIA radiation cmfp ajunct rx   Cataract    removed from both eyes   Chronic airway obstruction,  not elsewhere classified    Coronary artery calcification seen on CAT scan 01/23/2018   Diverticulosis    only has like 2   Dysplasia    hx treated with cerv conization   Fatty liver    US done 2010   Fibromyalgia    Hx of nonmelanoma skin cancer     followed dr Martin[place]    Hyperlipidemia    Hypothyroidism    Malignant neoplasm of breast (female), unspecified site 11/28/2013   Osteoarthritis    Osteopenia    REM sleep behavior disorder    Skin cancer    squamous cell    Past Surgical History:  Procedure Laterality Date   BREAST LUMPECTOMY     on right, 1990 IIA radiation cmfp ajuction rx   COLONOSCOPY     KNEE SURGERY  2012   NASAL SINUS SURGERY  2007   on right done by Dr. Wilburn Cornelia    Family History  Problem Relation Age of Onset   Other Father        mysthenia gravis   Lung cancer Sister    Osteoporosis Other    Thyroid disease Other  Breast cancer Mother 74       possible uterine cancer diagnosis at age 25   Osteoporosis Mother        no hip fracture   Breast cancer Sister 59   Lung cancer Sister 26       former smoker   Lung cancer Paternal Grandmother    Colon cancer Neg Hx    Esophageal cancer Neg Hx    Stomach cancer Neg Hx    Rectal cancer Neg Hx     Social History   Socioeconomic History   Marital status: Widowed    Spouse name: Not on file   Number of children: 1   Years of education: Not on file   Highest education level: Not on file  Occupational History   Occupation: Retired Pharmacist, hospital  Tobacco Use   Smoking status: Former    Packs/day: 1.00    Years: 10.00    Pack years: 10.00    Types: Cigarettes    Start date: Oct 24, 1962    Quit date: 07/11/1974    Years since quitting: 47.0   Smokeless tobacco: Never  Vaping Use   Vaping Use: Never used  Substance and Sexual Activity   Alcohol use: Yes    Alcohol/week: 7.0 standard drinks    Types: 7 Standard drinks or equivalent per week    Comment: 1 glass of red wine nightly and socially   Drug  use: No   Sexual activity: Yes    Partners: Male  Other Topics Concern   Not on file  Social History Narrative   Retired Pharmacist, hospital    hh of 1   Now has puppy    Quit smoking 30 + years ago. 20 year pack year smoking hx   Regular exercise- some  treadmill    8 hours sleep         Caffeine use: 1-2 cups of coffee/[employer] instant   Husband dx glioblastoma fall 15   Passed away fall Oct 24, 2014   Social Determinants of Health   Financial Resource Strain: Low Risk    Difficulty of Paying Living Expenses: Not hard at all  Food Insecurity: No Food Insecurity   Worried About Charity fundraiser in the Last Year: Never true   Ran Out of Food in the Last Year: Never true  Transportation Needs: No Transportation Needs   Lack of Transportation (Medical): No   Lack of Transportation (Non-Medical): No  Physical Activity: Sufficiently Active   Days of Exercise per Week: 5 days   Minutes of Exercise per Session: 30 min  Stress: No Stress Concern Present   Feeling of Stress : Not at all  Social Connections: Moderately Isolated   Frequency of Communication with Friends and Family: More than three times a week   Frequency of Social Gatherings with Friends and Family: More than three times a week   Attends Religious Services: 1 to 4 times per year   Active Member of [employer] or Organizations: No   Attends Archivist Meetings: Never   Marital Status: Widowed    Outpatient Medications Prior to Visit  Medication Sig Dispense Refill   acetaminophen (TYLENOL) 500 MG tablet Take 1,000 mg by mouth daily as needed. pain     albuterol (VENTOLIN HFA) 108 (90 Base) MCG/ACT inhaler Inhale 2 puffs into the lungs every 6 (six) hours as needed for wheezing or shortness of breath. 8 g 5   BREO ELLIPTA 100-25 MCG/INH AEPB INHALE 1 PUFF ONE TIME DAILY 180  each 3   Cholecalciferol (VITAMIN D3) 50 MCG (2000 UT) TABS Take by mouth daily.     diphenhydramine-acetaminophen (TYLENOL PM) 25-500 MG TABS Take 1 tablet  by mouth at bedtime as needed. sleep     valACYclovir (VALTREX) 1000 MG tablet TAKE 2 TABLETS BY MOUTH TWICE DAILY AS DIRECTED 90 tablet prn   vitamin B-12 (CYANOCOBALAMIN) 1000 MCG tablet Take 1,000 mcg by mouth daily.     vitamin C (ASCORBIC ACID) 500 MG tablet Take 500 mg by mouth daily.     atorvastatin (LIPITOR) 20 MG tablet TAKE 1 TABLET EVERY DAY 90 tablet 0   PARoxetine (PAXIL) 40 MG tablet TAKE 1 TABLET AT BEDTIME 90 tablet 0   SYNTHROID 88 MCG tablet TAKE 1 TABLET EVERY DAY 90 tablet 0   Facility-Administered Medications Prior to Visit  Medication Dose Route Frequency Provider Last Rate Last Admin   0.9 %  sodium chloride infusion  500 mL Intravenous Once Thornton Park, MD       0.9 %  sodium chloride infusion  500 mL Intravenous Once Thornton Park, MD         EXAM:  BP (!) 166/90 (BP Location: Left Arm, Patient Position: Sitting, Cuff Size: Normal)    Pulse 76    Temp 98.1 F (36.7 C) (Oral)    Ht 5' 8.5" (1.74 m)    Wt 156 lb 12.8 oz (71.1 kg)    SpO2 97%    BMI 23.49 kg/m   Body mass index is 23.49 kg/m. Wt Readings from Last 3 Encounters:  07/21/21 156 lb 12.8 oz (71.1 kg)  04/19/21 163 lb (73.9 kg)  03/16/21 163 lb (73.9 kg)    Physical Exam: Vital signs reviewed HYW:VPXT is a well-developed well-nourished alert cooperative    who appearsr stated age in no acute distress.  HEENT: normocephalic atraumatic , Eyes: PERRL EOM's full, conjunctiva clear, Nares: paten,t no deformity discharge or tenderness., Ears: no deformity EAC's clear TMs with normal landmarks. Mouth:NECK: supple without masses, thyromegaly or bruits. CHEST/PULM:  Clear to auscultation and percussion breath sounds equal no wheeze , rales or rhonchi. No chest wall deformities or tenderness. Breast: r rx changes  .  No nodules lesions  CV: PMI is nondisplaced, S1 S2 no gallops, murmurs, rubs. Peripheral pulses are full without delay.No JVD .  ABDOMEN: Bowel sounds normal nontender  No guard or  rebound, no hepato splenomegal no CVA tenderness.  Extremtities:  No clubbing cyanosis or edema, no acute joint swelling or redness no focal atrophy NEURO:  Oriented x3, cranial nerves 3-12 appear to be intact, no obvious focal weakness,gait within normal limits no abnormal reflexes or asymmetrical SKIN: No acute rashes normal turgor, color, no bruising or petechiae. PSYCH: Oriented, good eye contact, no obvious depression anxiety, cognition and judgment appear normal. LN: no cervical axillary inguinal adenopathy  Lab Results  Component Value Date   WBC 4.7 07/14/2021   HGB 13.6 07/14/2021   HCT 41.9 07/14/2021   PLT 176.0 07/14/2021   GLUCOSE 87 07/14/2021   CHOL 150 07/14/2021   TRIG 62.0 07/14/2021   HDL 56.80 07/14/2021   LDLDIRECT 137.0 09/11/2012   LDLCALC 81 07/14/2021   ALT 24 07/14/2021   ALT 24 07/14/2021   AST 21 07/14/2021   AST 21 07/14/2021   NA 140 07/14/2021   K 4.0 07/14/2021   CL 104 07/14/2021   CREATININE 0.91 07/14/2021   BUN 22 07/14/2021   CO2 29 07/14/2021   TSH 0.91 07/14/2021  HGBA1C 5.6 07/14/2021    BP Readings from Last 3 Encounters:  07/21/21 (!) 166/90  04/19/21 138/78  02/19/21 (!) 145/83    Lab results reviewed with patient   ASSESSMENT AND PLAN:  Discussed the following assessment and plan:    ICD-10-CM   1. Visit for preventive health examination  Z00.00     2. Medication management  Z79.899     3. Pure hypercholesterolemia  E78.00     4. Hypothyroidism, unspecified type  E03.9     5. Coronary artery calcification seen on CAT scan  I25.10     6. History of breast cancer  Z85.3     7. Personal history of COVID-19  Z86.16     8. Hemoptysis  R04.2    3 episodes in am uncertain if from ur tract     9. Elevated BP without diagnosis of hypertension  R03.0    send in some readings  for documentation    10. Urinary frequency  R35.0    and nocturia options disc  try med and maybe urology if  ongoing     11. Family history  of Sjogren's disease  Z82.69     Ask the  pulm specialists about  poss of Sjgren Agree with updated genetic testing . Based on  family history.  Nf1  her and daughter  BP  is up in office please send in readings from home as she says is at goal at home  Stop netti  use gentle saline Cont thyroid, paxil and atorva  Return in about 1 year (around 07/21/2022) for preventive /cpx and medications.  Patient Care Team: Yashira Offenberger, Standley Brooking, MD as PCP - Adelene Idler, MD Martinique, Amy, MD as Consulting Physician (Dermatology) Marygrace Drought, MD as Consulting Physician (Ophthalmology) Clent Jacks, MD as Consulting Physician (Ophthalmology) Brand Males, MD as Consulting Physician (Pulmonary Disease) Thornton Park, MD as Consulting Physician (Gastroenterology) Patient Instructions  You can try the myrbetric  to see if helps . Urinary frequency .   Continue lifestyle intervention healthy eating and exercise . Share information  with pulmonary team   Can ask the [facility] team about poss of autoimmune lung disease .  Yearly labs and check  Bp goal 130 and below range is best.  LDL best at 70 but  within range      Mariann Laster K. Sicilia Killough M.D.

## 2021-07-26 ENCOUNTER — Telehealth: Payer: Self-pay | Admitting: Internal Medicine

## 2021-07-26 NOTE — Telephone Encounter (Signed)
Pt last seen dr Regis Bill on 07-21-2021 and does need a referral to [facility] for genetic counseling. It has been over 3 yrs since pt had genetic counseling

## 2021-07-27 ENCOUNTER — Other Ambulatory Visit: Payer: Self-pay

## 2021-07-27 DIAGNOSIS — Z853 Personal history of malignant neoplasm of breast: Secondary | ICD-10-CM

## 2021-07-27 NOTE — Telephone Encounter (Signed)
Referral placed.

## 2021-07-27 NOTE — Telephone Encounter (Signed)
Please advise 

## 2021-07-27 NOTE — Telephone Encounter (Signed)
Ok please place referral  genetic counseling cancer center .  Hx of cancer and   family

## 2021-07-29 NOTE — Addendum Note (Signed)
Addended byShanon Ace K on: 07/29/2021 11:50 AM   Modules accepted: Orders

## 2021-07-30 ENCOUNTER — Telehealth: Payer: Self-pay | Admitting: Genetic Counselor

## 2021-07-30 NOTE — Telephone Encounter (Signed)
Scheduled appt per 1/19 referral. Spoke to pt who is aware of appt date and time. Pt is aware to arrive 15 mins prior to appt time.  

## 2021-08-09 DIAGNOSIS — J479 Bronchiectasis, uncomplicated: Secondary | ICD-10-CM | POA: Insufficient documentation

## 2021-08-10 DIAGNOSIS — J4540 Moderate persistent asthma, uncomplicated: Secondary | ICD-10-CM | POA: Diagnosis not present

## 2021-08-10 DIAGNOSIS — R0602 Shortness of breath: Secondary | ICD-10-CM | POA: Diagnosis not present

## 2021-08-10 DIAGNOSIS — R040 Epistaxis: Secondary | ICD-10-CM | POA: Diagnosis not present

## 2021-08-10 DIAGNOSIS — J329 Chronic sinusitis, unspecified: Secondary | ICD-10-CM | POA: Diagnosis not present

## 2021-08-10 DIAGNOSIS — J479 Bronchiectasis, uncomplicated: Secondary | ICD-10-CM | POA: Diagnosis not present

## 2021-08-12 DIAGNOSIS — J479 Bronchiectasis, uncomplicated: Secondary | ICD-10-CM | POA: Diagnosis not present

## 2021-08-14 DIAGNOSIS — J4540 Moderate persistent asthma, uncomplicated: Secondary | ICD-10-CM | POA: Insufficient documentation

## 2021-08-14 DIAGNOSIS — R040 Epistaxis: Secondary | ICD-10-CM | POA: Insufficient documentation

## 2021-09-06 ENCOUNTER — Other Ambulatory Visit: Payer: Self-pay | Admitting: Genetic Counselor

## 2021-09-06 DIAGNOSIS — Z853 Personal history of malignant neoplasm of breast: Secondary | ICD-10-CM

## 2021-09-08 ENCOUNTER — Encounter: Payer: Self-pay | Admitting: Genetic Counselor

## 2021-09-08 ENCOUNTER — Inpatient Hospital Stay: Payer: Medicare PPO | Attending: Genetic Counselor | Admitting: Genetic Counselor

## 2021-09-08 ENCOUNTER — Other Ambulatory Visit: Payer: Self-pay

## 2021-09-08 ENCOUNTER — Inpatient Hospital Stay: Payer: Medicare PPO

## 2021-09-08 DIAGNOSIS — Z803 Family history of malignant neoplasm of breast: Secondary | ICD-10-CM

## 2021-09-08 DIAGNOSIS — Z853 Personal history of malignant neoplasm of breast: Secondary | ICD-10-CM

## 2021-09-08 DIAGNOSIS — Z800 Family history of malignant neoplasm of digestive organs: Secondary | ICD-10-CM | POA: Diagnosis not present

## 2021-09-08 LAB — GENETIC SCREENING ORDER

## 2021-09-08 NOTE — Progress Notes (Signed)
REFERRING PROVIDER: Burnis Medin, MD [place],  [place] [address]  PRIMARY PROVIDER:  Burnis Medin, MD  PRIMARY REASON FOR VISIT:  1. Family history of pancreatic cancer   2. Family history of malignant neoplasm of breast   3. BREAST CANCER, HX OF      HISTORY OF PRESENT ILLNESS:   Jasmine Neal, a 76 y.o. female, was seen for a [facility] cancer genetics consultation at the request of Dr. Regis Bill due to a personal and family history of breast cancer.  [name] presents to clinic today to discuss the possibility of a hereditary predisposition to cancer, genetic testing, and to further clarify her future cancer risks, as well as potential cancer risks for family members.   In 1990, at the age of 27, Jasmine Neal was diagnosed with breast cancer.  This was treated with lumpectomy and radiation.  She was also found to have another breast cancer at age 31, which was treated with lumpectomy and radiation.  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      CANCER HISTORY:  Oncology History   No history exists.     RISK FACTORS:  Menarche was at age 59.  First live birth at age 67.  OCP use for approximately 5 years.  Ovaries intact: yes.  Hysterectomy: no.  Menopausal status: postmenopausal.  HRT use: 0 years. Colonoscopy: yes; normal. Mammogram within the last year: yes. Number of breast biopsies: 2. Up to date with pelvic exams: yes. Any excessive radiation exposure in the past: yes  Past Medical History:  Diagnosis Date   Anxiety    Asthma    evalutated by pulmonary severe by spirometry no smoker currently   Breast cancer ([place])    hx of right lumpectomy, 1990 IIA radiation cmfp ajunct rx   Cataract    removed from both eyes   Chronic airway obstruction, not elsewhere classified    Coronary artery calcification seen on CAT scan 01/23/2018   Diverticulosis    only has like 2    Dysplasia    hx treated with cerv conization   Family history of breast cancer    Family history of pancreatic cancer    Fatty liver    US done 2010   Fibromyalgia    Hx of nonmelanoma skin cancer     followed dr [place]ique    Hyperlipidemia    Hypothyroidism    Malignant neoplasm of breast (female), unspecified site 11/28/2013   Osteoarthritis    Osteopenia    REM sleep behavior disorder    Skin cancer    squamous cell    Past Surgical History:  Procedure Laterality Date   BREAST LUMPECTOMY     on right, 1990 IIA radiation cmfp ajuction rx   COLONOSCOPY     KNEE SURGERY  2012   NASAL SINUS SURGERY  2007   on right done by Dr. Wilburn Cornelia    Social History   Socioeconomic History   Marital status: Widowed    Spouse name: Not on file   Number of children: 1   Years of education: Not on file   Highest education level: Not on file  Occupational History   Occupation: Retired Pharmacist, hospital  Tobacco Use   Smoking status: Former    Packs/day: 1.00    Years: 10.00    Pack years: 10.00    Types: Cigarettes    Start date: 1964  Quit date: 07/11/1974    Years since quitting: 47.1   Smokeless tobacco: Never  Vaping Use   Vaping Use: Never used  Substance and Sexual Activity   Alcohol use: Yes    Alcohol/week: 7.0 standard drinks    Types: 7 Standard drinks or equivalent per week    Comment: 1 glass of red wine nightly and socially   Drug use: No   Sexual activity: Yes    Partners: Male  Other Topics Concern   Not on file  Social History Narrative   Retired Pharmacist, hospital    hh of 1   Now has puppy    Quit smoking 30 + years ago. 20 year pack year smoking hx   Regular exercise- some  treadmill    8 hours sleep         Caffeine use: 1-2 cups of coffee/[employer] instant   Husband dx glioblastoma fall 15   Passed away fall 08-07-14   Social Determinants of Health   Financial Resource Strain: Low Risk    Difficulty of Paying Living Expenses: Not hard at all  Food Insecurity: No  Food Insecurity   Worried About Charity fundraiser in the Last Year: Never true   Ran Out of Food in the Last Year: Never true  Transportation Needs: No Transportation Needs   Lack of Transportation (Medical): No   Lack of Transportation (Non-Medical): No  Physical Activity: Sufficiently Active   Days of Exercise per Week: 5 days   Minutes of Exercise per Session: 30 min  Stress: No Stress Concern Present   Feeling of Stress : Not at all  Social Connections: Moderately Isolated   Frequency of Communication with Friends and Family: More than three times a week   Frequency of Social Gatherings with Friends and Family: More than three times a week   Attends Religious Services: 1 to 4 times per year   Active Member of [employer] or Organizations: No   Attends Archivist Meetings: Never   Marital Status: Widowed     FAMILY HISTORY:  We obtained a detailed, 4-generation family history.  Significant diagnoses are listed below: Family History  Problem Relation Age of Onset   Breast cancer Mother 58       possible uterine cancer diagnosis at age 45   Osteoporosis Mother        no hip fracture   Other Father        mysthenia gravis   Lung cancer Sister    Breast cancer Sister 29   Lung cancer Sister 76       former smoker   Lung cancer Paternal Grandmother    Osteoporosis Other    Thyroid disease Other    Breast cancer Other        MGMs sister   Colon cancer Neg Hx    Esophageal cancer Neg Hx    Stomach cancer Neg Hx    Rectal cancer Neg Hx      The patient has a daughter who is cancer free.  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  The patient has three sisters, one who died of lung cancer and one who has had breast cancer 3x.  This sister had genetic testing and was found to have a BRCA2 VUS.  A third sister did not have cancer, but her son just died of pancreatic cancer at 2.  The patient's parents are deceased.  The patient's father had 5  siblings  who were cancer free.  His mother had lung cancer and his father died of non-cancer related issues.  The patient's mother had breast cancer at 35 and possibly uterine cancer at 37.  She had two brothers who were cancer free.  Her parents died of non cancer related issues, but her mother's sister had breast cancer.  Ms. Coddington is aware of previous family history of genetic testing for hereditary cancer risks. Patient's maternal ancestors are of [place] descent, and paternal ancestors are of [place] descent. There is no reported Ashkenazi Jewish ancestry. There is no known consanguinity.  GENETIC COUNSELING ASSESSMENT: Ms. Curl is a 76 y.o. female with a personal and family history of breast cancer which is somewhat suggestive of a hereditary cancer syndrome and predisposition to cancer given the combination of cancer and early ages of onset. We, therefore, discussed and recommended the following at today's visit.   DISCUSSION: We discussed that, in general, most cancer is not inherited in families, but instead is sporadic or familial. Sporadic cancers occur by chance and typically happen at older ages (>50 years) as this type of cancer is caused by genetic changes acquired during an individuals lifetime. Some families have more cancers than would be expected by chance; however, the ages or types of cancer are not consistent with a known genetic mutation or known genetic mutations have been ruled out. This type of familial cancer is thought to be due to a combination of multiple genetic, environmental, hormonal, and lifestyle factors. While this combination of factors likely increases the risk of cancer, the exact source of this risk is not currently identifiable or testable.  We discussed that 5 - 10% of breast cancer is hereditary, with most cases associated with BRCA mutations.  There are other genes that can be associated with hereditary breast cancer syndromes.  These include ATM, CHEK2 and  PALB2.  We discussed her previous genetic test, with the NF1 VUS.  We reviewed NF1, in that it typically is a clinical diagnosis where individuals have multiple cafe-au-lait spots (brown birthmarks) and lumps and bumps on and under the skin (neurofibromas).  The patient denies having these.  Since NF1 has a virtual 100% penetrance, and the patient does not have symptoms, it is very unlikely that the NF1 VUS is a true pathogenic mutation.  We will treat this VUS as a negative result.    Since 2015, testing has improved with more genes tested and RNA added onto the DNA testing.  RNA allows [place] to look deeper within genes, and assess splicing differences.  Updating the patient's genetic testing would be important to do in order to understand whether we need to change her medical management in any way.  We discussed that testing is beneficial for several reasons including knowing how to follow individuals after completing their treatment, identifying whether potential treatment options such as PARP inhibitors would be beneficial, and understand if other family members could be at risk for cancer and allow them to undergo genetic testing.   We reviewed the characteristics, features and inheritance patterns of hereditary cancer syndromes. We also discussed genetic testing, including the appropriate family members to test, the process of testing, insurance coverage and turn-around-time for results. We discussed the implications of a negative, positive, carrier and/or variant of uncertain significant result. We recommended Ms. Enck pursue genetic testing for the CancerNext-Expanded+RNAinsight gene panel.   The CancerNext-Expanded gene panel offered by [facility] and includes sequencing and rearrangement analysis for the following 77 genes: AIP,  ALK, APC*, ATM*, AXIN2, BAP1, BARD1, BLM, BMPR1A, BRCA1*, BRCA2*, BRIP1*, CDC73, CDH1*, CDK4, CDKN1B, CDKN2A, CHEK2*, CTNNA1, DICER1, FANCC, FH, FLCN, GALNT12, KIF1B,  LZTR1, MAX, MEN1, MET, MLH1*, MSH2*, MSH3, MSH6*, MUTYH*, NBN, NF1*, NF2, NTHL1, PALB2*, PHOX2B, PMS2*, POT1, PRKAR1A, PTCH1, PTEN*, RAD51C*, RAD51D*, RB1, RECQL, RET, SDHA, SDHAF2, SDHB, SDHC, SDHD, SMAD4, SMARCA4, SMARCB1, SMARCE1, STK11, SUFU, TMEM127, TP53*, TSC1, TSC2, VHL and XRCC2 (sequencing and deletion/duplication); EGFR, EGLN1, HOXB13, KIT, MITF, PDGFRA, POLD1, and POLE (sequencing only); EPCAM and GREM1 (deletion/duplication only). DNA and RNA analyses performed for * genes.   Based on Ms. Conti's personal and family history of cancer, she meets medical criteria for genetic testing. Despite that she meets criteria, she may still have an out of pocket cost. We discussed that if her out of pocket cost for testing is over $100, the laboratory will call and confirm whether she wants to proceed with testing.  If the out of pocket cost of testing is less than $100 she will be billed by the genetic testing laboratory.   PLAN: After considering the risks, benefits, and limitations, Ms. Coop provided informed consent to pursue genetic testing and the blood sample was sent to [facility] for analysis of the CancerNext-Expanded+RNAinsight. Results should be available within approximately 2-3 weeks' time, at which point they will be disclosed by telephone to Ms. Nieman, as will any additional recommendations warranted by these results. Ms. Betterton will receive a summary of her genetic counseling visit and a copy of her results once available. This information will also be available in Epic.   Lastly, we encouraged Ms. Pernice to remain in contact with cancer genetics annually so that we can continuously update the family history and inform her of any changes in cancer genetics and testing that may be of benefit for this family.   Ms. Krebbs questions were answered to her satisfaction today. Our contact information was provided should additional questions or concerns arise.  Thank you for the referral and allowing [place] to share in the care of your patient.   Mirel Hundal P. Florene Glen, [place], [facility] Licensed, Insurance risk surveyor [email or web address] phone: [phone]  The patient was seen for a total of 40 minutes in face-to-face genetic counseling.  The patient was seen alone.  This patient was discussed with Drs. Magrinat, Lindi Adie and/or Burr Medico who agrees with the above.    _______________________________________________________________________ For Office Staff:  Number of people involved in session: 1 Was an Intern/ student involved with case: yes

## 2021-09-13 ENCOUNTER — Other Ambulatory Visit: Payer: Self-pay | Admitting: Internal Medicine

## 2021-09-13 DIAGNOSIS — Z1231 Encounter for screening mammogram for malignant neoplasm of breast: Secondary | ICD-10-CM

## 2021-09-20 ENCOUNTER — Telehealth: Payer: Self-pay | Admitting: Internal Medicine

## 2021-09-20 NOTE — Telephone Encounter (Signed)
Left message for patient to call back and schedule Medicare Annual Wellness Visit (AWV) either virtually or in office. Left  my Jasmine Neal number [phone] ? ? ?Last AWV 09/29/20 ? please schedule at anytime with [facility] Nurse Health Advisor 1 or 2 ? ?Awv can be scheduled calendar year human[employer] ? ?This should be a 45 minute visit.  ?

## 2021-09-22 ENCOUNTER — Telehealth: Payer: Self-pay | Admitting: Internal Medicine

## 2021-09-22 NOTE — Telephone Encounter (Signed)
Returned pt call ? ?Left message for patient to call back and schedule Medicare Annual Wellness Visit (AWV) either virtually or in office. Left  my Herbie Drape number [phone] ? ? ?Last AWV 09/29/20 please schedule at anytime with [facility] Nurse Health Advisor 1 or 2 ? ? ?This should be a 45 minute visit.  ?

## 2021-09-27 ENCOUNTER — Emergency Department (HOSPITAL_BASED_OUTPATIENT_CLINIC_OR_DEPARTMENT_OTHER): Payer: Medicare PPO

## 2021-09-27 ENCOUNTER — Other Ambulatory Visit: Payer: Self-pay

## 2021-09-27 ENCOUNTER — Emergency Department (HOSPITAL_BASED_OUTPATIENT_CLINIC_OR_DEPARTMENT_OTHER): Payer: Medicare PPO | Admitting: Radiology

## 2021-09-27 ENCOUNTER — Encounter (HOSPITAL_BASED_OUTPATIENT_CLINIC_OR_DEPARTMENT_OTHER): Payer: Self-pay | Admitting: Emergency Medicine

## 2021-09-27 ENCOUNTER — Emergency Department (HOSPITAL_BASED_OUTPATIENT_CLINIC_OR_DEPARTMENT_OTHER)
Admission: EM | Admit: 2021-09-27 | Discharge: 2021-09-27 | Disposition: A | Discharge status: Home / Self Care | Payer: Medicare PPO | Attending: Emergency Medicine | Admitting: Emergency Medicine

## 2021-09-27 DIAGNOSIS — S0083XA Contusion of other part of head, initial encounter: Secondary | ICD-10-CM | POA: Diagnosis not present

## 2021-09-27 DIAGNOSIS — M25562 Pain in left knee: Secondary | ICD-10-CM | POA: Diagnosis not present

## 2021-09-27 DIAGNOSIS — Z853 Personal history of malignant neoplasm of breast: Secondary | ICD-10-CM | POA: Diagnosis not present

## 2021-09-27 DIAGNOSIS — S098XXA Other specified injuries of head, initial encounter: Secondary | ICD-10-CM | POA: Diagnosis not present

## 2021-09-27 DIAGNOSIS — Z041 Encounter for examination and observation following transport accident: Secondary | ICD-10-CM | POA: Diagnosis not present

## 2021-09-27 DIAGNOSIS — S299XXA Unspecified injury of thorax, initial encounter: Secondary | ICD-10-CM | POA: Diagnosis not present

## 2021-09-27 DIAGNOSIS — S8992XA Unspecified injury of left lower leg, initial encounter: Secondary | ICD-10-CM | POA: Insufficient documentation

## 2021-09-27 DIAGNOSIS — S0990XA Unspecified injury of head, initial encounter: Secondary | ICD-10-CM | POA: Diagnosis present
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
## 2021-09-27 NOTE — Discharge Instructions (Signed)
The x-rays did not show any signs of fracture or serious injury.  Take over-the-counter medications as needed for pain.  Apply ice to help with swelling and bruising.  Expect to be stiff and sore for the next several days. ?
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
## 2021-09-28 ENCOUNTER — Ambulatory Visit
Admission: RE | Admit: 2021-09-28 | Discharge: 2021-09-28 | Disposition: A | Discharge status: Home / Self Care | Payer: Medicare PPO | Source: Ambulatory Visit | Attending: Internal Medicine | Admitting: Internal Medicine

## 2021-09-28 DIAGNOSIS — Z1231 Encounter for screening mammogram for malignant neoplasm of breast: Secondary | ICD-10-CM

## 2021-09-30 ENCOUNTER — Telehealth: Payer: Self-pay | Admitting: Genetic Counselor

## 2021-09-30 ENCOUNTER — Encounter: Payer: Self-pay | Admitting: Genetic Counselor

## 2021-09-30 DIAGNOSIS — Z1379 Encounter for other screening for genetic and chromosomal anomalies: Secondary | ICD-10-CM | POA: Insufficient documentation

## 2021-09-30 NOTE — Telephone Encounter (Signed)
LM on VM that results are back and to please call. 

## 2021-10-01 NOTE — Telephone Encounter (Signed)
LM on VM that results are back and to please call.  2nd attempt. ?

## 2021-10-06 ENCOUNTER — Telehealth: Payer: Self-pay | Admitting: Genetic Counselor

## 2021-10-06 NOTE — Telephone Encounter (Signed)
3rd attempt.  LM on VM that results are back and to please call. ?

## 2021-10-07 ENCOUNTER — Encounter: Payer: Self-pay | Admitting: Genetic Counselor

## 2021-10-11 ENCOUNTER — Ambulatory Visit: Payer: Self-pay | Admitting: Genetic Counselor

## 2021-10-11 ENCOUNTER — Telehealth: Payer: Self-pay | Admitting: Genetic Counselor

## 2021-10-11 DIAGNOSIS — Z1379 Encounter for other screening for genetic and chromosomal anomalies: Secondary | ICD-10-CM

## 2021-10-11 NOTE — Telephone Encounter (Signed)
Revealed negative genetic testing.  Discussed that we do not know why she has breast cancer or why there is cancer in the family. It could be due to a different gene that we are not testing, or maybe our current technology may not be able to pick something up.  It will be important for her to keep in contact with genetics to keep up with whether additional testing may be needed.  ? 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 ? ? ?

## 2021-10-11 NOTE — Progress Notes (Signed)
HPI:  Ms. Droege was previously seen in the [facility] clinic due to a personal and family history of breast cancer and concerns regarding a hereditary predisposition to cancer. Please refer to our prior cancer genetics clinic note for more information regarding our discussion, assessment and recommendations, at the time. Ms. Viti recent genetic test results were disclosed to her, as were recommendations warranted by these results. These results and recommendations are discussed in more detail below. ? ?CANCER HISTORY:  ?Oncology History  ? No history exists.  ? ? ?FAMILY HISTORY:  ?We obtained a detailed, 4-generation family history.  Significant diagnoses are listed below: ?Family History  ?Problem Relation Age of Onset  ? Breast cancer Mother 42  ?     possible uterine cancer diagnosis at age 53  ? Osteoporosis Mother   ?     no hip fracture  ? Other Father   ?     mysthenia gravis  ? Lung cancer Sister   ? Breast cancer Sister 52  ? Lung cancer Sister 83  ?     former smoker  ? Lung cancer Paternal Grandmother   ? Osteoporosis Other   ? Thyroid disease Other   ? Breast cancer Other   ?     MGMs sister  ? Colon cancer Neg Hx   ? Esophageal cancer Neg Hx   ? Stomach cancer Neg Hx   ? Rectal cancer Neg Hx   ? ? ?  ?The patient has a daughter who is cancer free.  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  The patient has three sisters, one who died of lung cancer and one who has had breast cancer 3x.  This sister had genetic testing and was found to have a BRCA2 VUS.  A third sister did not have cancer, but her son just died of pancreatic cancer at 57.  The patient's parents are deceased. ?  ?The patient's father had 5 siblings who were cancer free.  His mother had lung cancer and his father died of non-cancer related issues. ?  ?The patient's mother had breast cancer at 73 and possibly uterine cancer at 65.  She had two brothers who were cancer free.  Her parents died  of non cancer related issues, but her mother's sister had breast cancer. ?  ?Ms. Dubow is aware of previous family history of genetic testing for hereditary cancer risks. Patient's maternal ancestors are of [place] descent, and paternal ancestors are of [place] descent. There is no reported Ashkenazi Jewish ancestry. There is no known consanguinity. ? ?GENETIC TEST RESULTS: Genetic testing reported out on September 28, 2021 through the CancerNext-Expanded+RNAinsight cancer panel found no pathogenic mutations. The CancerNext-Expanded gene panel offered by [facility] and includes sequencing and rearrangement analysis for the following 77 genes: AIP, ALK, APC*, ATM*, AXIN2, BAP1, BARD1, BLM, BMPR1A, BRCA1*, BRCA2*, BRIP1*, CDC73, CDH1*, CDK4, CDKN1B, CDKN2A, CHEK2*, CTNNA1, DICER1, FANCC, FH, FLCN, GALNT12, KIF1B, LZTR1, MAX, MEN1, MET, MLH1*, MSH2*, MSH3, MSH6*, MUTYH*, NBN, NF1*, NF2, NTHL1, PALB2*, PHOX2B, PMS2*, POT1, PRKAR1A, PTCH1, PTEN*, RAD51C*, RAD51D*, RB1, RECQL, RET, SDHA, SDHAF2, SDHB, SDHC, SDHD, SMAD4, SMARCA4, SMARCB1, SMARCE1, STK11, SUFU, TMEM127, TP53*, TSC1, TSC2, VHL and XRCC2 (sequencing and deletion/duplication); EGFR, EGLN1, HOXB13, KIT, MITF, PDGFRA, POLD1, and POLE (sequencing only); EPCAM and GREM1 (deletion/duplication only). DNA and RNA analyses performed for * genes. The test report has been scanned into EPIC and is located under the Molecular Pathology section of the Results  Review tab.  A portion of the result report is included below for reference.  ? ? ? ?We discussed with Ms. Coverdale that because current genetic testing is not perfect, it is possible there may be a gene mutation in one of these genes that current testing cannot detect, but that chance is small.  We also discussed, that there could be another gene that has not yet been discovered, or that we have not yet tested, that is responsible for the cancer diagnoses in the family. It is also possible there is a hereditary cause  for the cancer in the family that Ms. Jerde did not inherit and therefore was not identified in her testing.  Therefore, it is important to remain in touch with cancer genetics in the future so that we can continue to offer Ms. Maeda the most up to date genetic testing.  ? 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  At this time, it is unknown if this variant is associated with increased cancer risk or if this is a normal finding, but most variants such as this get reclassified to being inconsequential. It should not be used to make medical management decisions. With time, we suspect the lab will determine the significance of this variant, if any. If we do learn more about it, we will try to contact Ms. Hudlow to discuss it further. However, it is important to stay in touch with [place] periodically and keep the address and phone number up to date. ? ?ADDITIONAL GENETIC TESTING: We discussed with Ms. Forester that her genetic testing was fairly extensive.  If there are genes identified to increase cancer risk that can be analyzed in the future, we would be happy to discuss and coordinate this testing at that time.   ? ?CANCER SCREENING RECOMMENDATIONS: Ms. Mehra test result is considered negative (normal).  This means that we have not identified a hereditary cause for her personal and family history of breast cancer at this time. Most cancers happen by chance and this negative test suggests that her cancer may fall into this category.   ? ?While reassuring, this does not definitively rule out a hereditary predisposition to cancer. It is still possible that there could be genetic mutations that are undetectable by current technology. There could be genetic mutations in genes that have not been tested or identified to increase cancer risk.  Therefore, it is recommended she continue to follow the cancer management and screening  guidelines provided by her oncology and primary healthcare provider.  ? ?An individual's cancer risk and medical management are not determined by genetic test results alone. Overall cancer risk assessment incorporates additional factors, including personal medical history, family history, and any available genetic information that may result in a personalized plan for cancer prevention and surveillance ? ?RECOMMENDATIONS FOR FAMILY MEMBERS:  Individuals in this family might be at some increased risk of developing cancer, over the general population risk, simply due to the family history of cancer.  We recommended women in this family have a yearly mammogram beginning at age 67, or 4 years younger than the earliest onset of cancer, an annual clinical breast exam, and perform monthly breast self-exams. Women in this family should also have a gynecological exam as recommended by their primary provider. All family members should be referred for colonoscopy starting at age 33. ? ?FOLLOW-UP: Lastly, we discussed with Ms. Smay that cancer genetics is a rapidly advancing field  and it is possible that new genetic tests will be appropriate for her and/or her family members in the future. We encouraged her to remain in contact with cancer genetics on an annual basis so we can update her personal and family histories and let her know of advances in cancer genetics that may benefit this family.  ? ?Our contact number was provided. Ms. Huizar questions were answered to her satisfaction, and she knows she is welcome to call us at anytime with additional questions or concerns.  ? ?Roma Kayser, MS, [place] ?Licensed, Insurance risk surveyor ?[email or web address] ? ?

## 2021-10-21 ENCOUNTER — Telehealth: Payer: Self-pay | Admitting: Internal Medicine

## 2021-10-21 NOTE — Telephone Encounter (Signed)
Left message for patient to call back and schedule Medicare Annual Wellness Visit (AWV) either virtually or in office. Left  my jabber number [phone]   Last AWV ;09/29/20  please schedule at anytime with [facility] Nurse Health Advisor 1 or 2    

## 2021-11-15 ENCOUNTER — Telehealth: Payer: Self-pay | Admitting: Internal Medicine

## 2021-11-15 NOTE — Telephone Encounter (Signed)
Left message for patient to call back and schedule Medicare Annual Wellness Visit (AWV) either virtually or in office. Left  my jabber number [phone]   Last AWV ;09/29/20  please schedule at anytime with [facility] Nurse Health Advisor 1 or 2    

## 2021-11-18 DIAGNOSIS — R0602 Shortness of breath: Secondary | ICD-10-CM | POA: Diagnosis not present

## 2021-11-25 DIAGNOSIS — J310 Chronic rhinitis: Secondary | ICD-10-CM | POA: Diagnosis not present

## 2021-11-25 DIAGNOSIS — J329 Chronic sinusitis, unspecified: Secondary | ICD-10-CM | POA: Diagnosis not present

## 2021-11-25 DIAGNOSIS — R040 Epistaxis: Secondary | ICD-10-CM | POA: Diagnosis not present

## 2021-12-10 DIAGNOSIS — J449 Chronic obstructive pulmonary disease, unspecified: Secondary | ICD-10-CM | POA: Diagnosis not present

## 2021-12-10 DIAGNOSIS — J4540 Moderate persistent asthma, uncomplicated: Secondary | ICD-10-CM | POA: Diagnosis not present

## 2021-12-10 DIAGNOSIS — J479 Bronchiectasis, uncomplicated: Secondary | ICD-10-CM | POA: Diagnosis not present

## 2021-12-21 ENCOUNTER — Telehealth: Payer: Self-pay | Admitting: Internal Medicine

## 2021-12-21 NOTE — Telephone Encounter (Signed)
Left message for patient to call back and schedule Medicare Annual Wellness Visit (AWV) either virtually or in office. Left  my jabber number [phone]   Last AWV ;09/29/20  please schedule at anytime with [facility] Nurse Health Advisor 1 or 2    

## 2021-12-29 DIAGNOSIS — D2262 Melanocytic nevi of left upper limb, including shoulder: Secondary | ICD-10-CM | POA: Diagnosis not present

## 2021-12-29 DIAGNOSIS — D22121 Melanocytic nevi of left upper eyelid, including canthus: Secondary | ICD-10-CM | POA: Diagnosis not present

## 2021-12-29 DIAGNOSIS — D225 Melanocytic nevi of trunk: Secondary | ICD-10-CM | POA: Diagnosis not present

## 2021-12-29 DIAGNOSIS — L814 Other melanin hyperpigmentation: Secondary | ICD-10-CM | POA: Diagnosis not present

## 2021-12-29 DIAGNOSIS — L821 Other seborrheic keratosis: Secondary | ICD-10-CM | POA: Diagnosis not present

## 2022-02-10 ENCOUNTER — Encounter: Payer: Self-pay | Admitting: Internal Medicine

## 2022-02-10 ENCOUNTER — Ambulatory Visit (INDEPENDENT_AMBULATORY_CARE_PROVIDER_SITE_OTHER): Payer: Medicare PPO

## 2022-02-10 ENCOUNTER — Ambulatory Visit: Payer: Medicare PPO | Admitting: Internal Medicine

## 2022-02-10 VITALS — BP 130/82 | HR 73 | Temp 98.7°F | Wt 160.5 lb

## 2022-02-10 DIAGNOSIS — T148XXA Other injury of unspecified body region, initial encounter: Secondary | ICD-10-CM

## 2022-02-10 DIAGNOSIS — S7001XA Contusion of right hip, initial encounter: Secondary | ICD-10-CM

## 2022-02-10 DIAGNOSIS — M25551 Pain in right hip: Secondary | ICD-10-CM

## 2022-02-10 DIAGNOSIS — S79911A Unspecified injury of right hip, initial encounter: Secondary | ICD-10-CM | POA: Diagnosis not present

## 2022-02-10 NOTE — Patient Instructions (Signed)
I think indeed you ripped a muscle  and thus a lot of bruising  Ice after activity if pain .  X ray today . If all ok then time may take weeks for  bruising to resolve.  If  continued abnormal bruising  then we can check blood counts etc
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
## 2022-02-11 NOTE — Progress Notes (Signed)
Good news Hip x ray normal no acute findings

## 2022-02-16 ENCOUNTER — Telehealth: Payer: Self-pay | Admitting: Internal Medicine

## 2022-02-16 NOTE — Telephone Encounter (Signed)
Tried calling patient to  schedule Medicare Annual Wellness Visit (AWV) either virtually or in office. Left  my Jasmine Neal number [phone]  No answer    Last AWV ;09/29/20  please schedule at anytime with [facility] Nurse Health Advisor 1 or 2

## 2022-03-28 ENCOUNTER — Telehealth: Payer: Self-pay | Admitting: Internal Medicine

## 2022-03-28 NOTE — Telephone Encounter (Signed)
Patient declined the Medicare Wellness Visit with [facility] this year.   Stated she will do only if Dr Regis Bill would like it completed

## 2022-07-18 ENCOUNTER — Telehealth: Payer: Self-pay | Admitting: Internal Medicine

## 2022-07-18 ENCOUNTER — Other Ambulatory Visit: Payer: Self-pay | Admitting: Family

## 2022-07-18 DIAGNOSIS — E038 Other specified hypothyroidism: Secondary | ICD-10-CM

## 2022-07-18 DIAGNOSIS — J449 Chronic obstructive pulmonary disease, unspecified: Secondary | ICD-10-CM

## 2022-07-18 DIAGNOSIS — E7801 Familial hypercholesterolemia: Secondary | ICD-10-CM

## 2022-07-18 NOTE — Telephone Encounter (Signed)
Pt has cpe resch for 08-12-2022 and would like cpe in advance. Pt is aware cpe is with padonda and she may or maynot put order in for lab work in advance

## 2022-07-20 ENCOUNTER — Other Ambulatory Visit: Payer: Self-pay | Admitting: Family

## 2022-07-20 ENCOUNTER — Other Ambulatory Visit: Payer: Self-pay | Admitting: Internal Medicine

## 2022-07-20 ENCOUNTER — Telehealth: Payer: Self-pay | Admitting: Internal Medicine

## 2022-07-20 NOTE — Telephone Encounter (Signed)
Patient requesting to have labs drawn a week prior to her cpe appt on 08/12/22 with P Justin Mend. Advised some insurances won't pay for the labs unless billed with a preventive service

## 2022-07-21 NOTE — Telephone Encounter (Signed)
Called patient at number  [phone],  lvm lab orders were previously placed and to contact office to schedule lab appointment.

## 2022-07-21 NOTE — Telephone Encounter (Signed)
Pt has been sch for 08-05-2022

## 2022-07-29 ENCOUNTER — Other Ambulatory Visit: Payer: Self-pay | Admitting: Internal Medicine

## 2022-08-05 ENCOUNTER — Other Ambulatory Visit (INDEPENDENT_AMBULATORY_CARE_PROVIDER_SITE_OTHER): Payer: Medicare PPO

## 2022-08-05 DIAGNOSIS — J449 Chronic obstructive pulmonary disease, unspecified: Secondary | ICD-10-CM

## 2022-08-05 DIAGNOSIS — E7801 Familial hypercholesterolemia: Secondary | ICD-10-CM

## 2022-08-05 DIAGNOSIS — E038 Other specified hypothyroidism: Secondary | ICD-10-CM | POA: Diagnosis not present

## 2022-08-05 LAB — CBC WITH DIFFERENTIAL/PLATELET
Basophils Absolute: 0 10*3/uL (ref 0.0–0.1)
Basophils Relative: 1.1 % (ref 0.0–3.0)
Eosinophils Absolute: 0.2 10*3/uL (ref 0.0–0.7)
Eosinophils Relative: 5.9 % — ABNORMAL HIGH (ref 0.0–5.0)
HCT: 41.5 % (ref 36.0–46.0)
Hemoglobin: 13.9 g/dL (ref 12.0–15.0)
Lymphocytes Relative: 38.2 % (ref 12.0–46.0)
Lymphs Abs: 1.6 10*3/uL (ref 0.7–4.0)
MCHC: 33.6 g/dL (ref 30.0–36.0)
MCV: 94.2 fl (ref 78.0–100.0)
Monocytes Absolute: 0.3 10*3/uL (ref 0.1–1.0)
Monocytes Relative: 8 % (ref 3.0–12.0)
Neutro Abs: 1.9 10*3/uL (ref 1.4–7.7)
Neutrophils Relative %: 46.8 % (ref 43.0–77.0)
Platelets: 172 10*3/uL (ref 150.0–400.0)
RBC: 4.4 Mil/uL (ref 3.87–5.11)
RDW: 13.5 % (ref 11.5–15.5)
WBC: 4.1 10*3/uL (ref 4.0–10.5)

## 2022-08-05 LAB — BASIC METABOLIC PANEL
BUN: 13 mg/dL (ref 6–23)
CO2: 30 mEq/L (ref 19–32)
Calcium: 9.7 mg/dL (ref 8.4–10.5)
Chloride: 105 mEq/L (ref 96–112)
Creatinine, Ser: 0.73 mg/dL (ref 0.40–1.20)
GFR: 79.17 mL/min (ref >60.00)
Glucose, Bld: 86 mg/dL (ref 70–99)
Potassium: 3.7 mEq/L (ref 3.5–5.1)
Sodium: 141 mEq/L (ref 135–145)

## 2022-08-05 LAB — HEPATIC FUNCTION PANEL
ALT: 33 U/L (ref 0–35)
AST: 35 U/L (ref 0–37)
Albumin: 4.1 g/dL (ref 3.5–5.2)
Alkaline Phosphatase: 68 U/L (ref 39–117)
Bilirubin, Direct: 0.1 mg/dL (ref 0.0–0.3)
Total Bilirubin: 0.5 mg/dL (ref 0.2–1.2)
Total Protein: 6.6 g/dL (ref 6.0–8.3)

## 2022-08-05 LAB — VITAMIN B12: Vitamin B-12: 575 pg/mL (ref 211–911)

## 2022-08-05 LAB — LIPID PANEL
Cholesterol: 168 mg/dL (ref 0–200)
HDL: 68 mg/dL (ref >39.00)
LDL Cholesterol: 87 mg/dL (ref 0–99)
NonHDL: 99.73
Total CHOL/HDL Ratio: 2
Triglycerides: 65 mg/dL (ref 0.0–149.0)
VLDL: 13 mg/dL (ref 0.0–40.0)

## 2022-08-05 LAB — TSH: TSH: 0.56 u[IU]/mL (ref 0.35–5.50)

## 2022-08-10 ENCOUNTER — Encounter: Payer: Medicare PPO | Admitting: Internal Medicine

## 2022-08-12 ENCOUNTER — Encounter: Payer: Self-pay | Admitting: Family

## 2022-08-12 ENCOUNTER — Ambulatory Visit (INDEPENDENT_AMBULATORY_CARE_PROVIDER_SITE_OTHER): Payer: Medicare PPO | Admitting: Family

## 2022-08-12 VITALS — BP 142/84 | HR 78 | Temp 98.0°F | Resp 18 | Ht 68.0 in | Wt 161.0 lb

## 2022-08-12 DIAGNOSIS — M25511 Pain in right shoulder: Secondary | ICD-10-CM | POA: Diagnosis not present

## 2022-08-12 DIAGNOSIS — R140 Abdominal distension (gaseous): Secondary | ICD-10-CM | POA: Diagnosis not present

## 2022-08-12 DIAGNOSIS — Z853 Personal history of malignant neoplasm of breast: Secondary | ICD-10-CM

## 2022-08-12 DIAGNOSIS — Z1231 Encounter for screening mammogram for malignant neoplasm of breast: Secondary | ICD-10-CM

## 2022-08-12 DIAGNOSIS — J449 Chronic obstructive pulmonary disease, unspecified: Secondary | ICD-10-CM | POA: Diagnosis not present

## 2022-08-12 DIAGNOSIS — E785 Hyperlipidemia, unspecified: Secondary | ICD-10-CM | POA: Diagnosis not present

## 2022-08-12 DIAGNOSIS — Z0000 Encounter for general adult medical examination without abnormal findings: Secondary | ICD-10-CM

## 2022-08-12 DIAGNOSIS — E038 Other specified hypothyroidism: Secondary | ICD-10-CM | POA: Diagnosis not present

## 2022-08-12 NOTE — Patient Instructions (Signed)
Health Maintenance, Female Adopting a healthy lifestyle and getting preventive care are important in promoting health and wellness. Ask your health care provider about: The right schedule for you to have regular tests and exams. Things you can do on your own to prevent diseases and keep yourself healthy. What should I know about diet, weight, and exercise? Eat a healthy diet  Eat a diet that includes plenty of vegetables, fruits, low-fat dairy products, and lean protein. Do not eat a lot of foods that are high in solid fats, added sugars, or sodium. Maintain a healthy weight Body mass index (BMI) is used to identify weight problems. It estimates body fat based on height and weight. Your health care provider can help determine your BMI and help you achieve or maintain a healthy weight. Get regular exercise Get regular exercise. This is one of the most important things you can do for your health. Most adults should: Exercise for at least 150 minutes each week. The exercise should increase your heart rate and make you sweat (moderate-intensity exercise). Do strengthening exercises at least twice a week. This is in addition to the moderate-intensity exercise. Spend less time sitting. Even light physical activity can be beneficial. Watch cholesterol and blood lipids Have your blood tested for lipids and cholesterol at 78 years of age, then have this test every 5 years. Have your cholesterol levels checked more often if: Your lipid or cholesterol levels are high. You are older than 78 years of age. You are at high risk for heart disease. What should I know about cancer screening? Depending on your health history and family history, you may need to have cancer screening at various ages. This may include screening for: Breast cancer. Cervical cancer. Colorectal cancer. Skin cancer. Lung cancer. What should I know about heart disease, diabetes, and high blood pressure? Blood pressure and heart  disease High blood pressure causes heart disease and increases the risk of stroke. This is more likely to develop in people who have high blood pressure readings or are overweight. Have your blood pressure checked: Every 3-5 years if you are 18-39 years of age. Every year if you are 40 years old or older. Diabetes Have regular diabetes screenings. This checks your fasting blood sugar level. Have the screening done: Once every three years after age 40 if you are at a normal weight and have a low risk for diabetes. More often and at a younger age if you are overweight or have a high risk for diabetes. What should I know about preventing infection? Hepatitis B If you have a higher risk for hepatitis B, you should be screened for this virus. Talk with your health care provider to find out if you are at risk for hepatitis B infection. Hepatitis C Testing is recommended for: Everyone born from 1945 through 1965. Anyone with known risk factors for hepatitis C. Sexually transmitted infections (STIs) Get screened for STIs, including gonorrhea and chlamydia, if: You are sexually active and are younger than 78 years of age. You are older than 78 years of age and your health care provider tells you that you are at risk for this type of infection. Your sexual activity has changed since you were last screened, and you are at increased risk for chlamydia or gonorrhea. Ask your health care provider if you are at risk. Ask your health care provider about whether you are at high risk for HIV. Your health care provider may recommend a prescription medicine to help prevent HIV   infection. If you choose to take medicine to prevent HIV, you should first get tested for HIV. You should then be tested every 3 months for as long as you are taking the medicine. Pregnancy If you are about to stop having your period (premenopausal) and you may become pregnant, seek counseling before you get pregnant. Take 400 to 800  micrograms (mcg) of folic acid every day if you become pregnant. Ask for birth control (contraception) if you want to prevent pregnancy. Osteoporosis and menopause Osteoporosis is a disease in which the bones lose minerals and strength with aging. This can result in bone fractures. If you are 78 years old or older, or if you are at risk for osteoporosis and fractures, ask your health care provider if you should: Be screened for bone loss. Take a calcium or vitamin D supplement to lower your risk of fractures. Be given hormone replacement therapy (HRT) to treat symptoms of menopause. Follow these instructions at home: Alcohol use Do not drink alcohol if: Your health care provider tells you not to drink. You are pregnant, may be pregnant, or are planning to become pregnant. If you drink alcohol: Limit how much you have to: 0-1 drink a day. Know how much alcohol is in your drink. In the [place] one drink equals one 12 oz bottle of beer (355 mL), one 5 oz glass of wine (148 mL), or one 1 oz glass of hard liquor (44 mL). Lifestyle Do not use any products that contain nicotine or tobacco. These products include cigarettes, chewing tobacco, and vaping devices, such as e-cigarettes. If you need help quitting, ask your health care provider. Do not use street drugs. Do not share needles. Ask your health care provider for help if you need support or information about quitting drugs. General instructions Schedule regular health, dental, and eye exams. Stay current with your vaccines. Tell your health care provider if: You often feel depressed. You have ever been abused or do not feel safe at home. Summary Adopting a healthy lifestyle and getting preventive care are important in promoting health and wellness. Follow your health care provider's instructions about healthy diet, exercising, and getting tested or screened for diseases. Follow your health care provider's instructions on monitoring your  cholesterol and blood pressure. This information is not intended to replace advice given to you by your health care provider. Make sure you discuss any questions you have with your health care provider. Document Revised: 11/16/2020 Document Reviewed: 11/16/2020 Elsevier Patient Education  [place] and Cholesterol Restricted Eating Plan Eating a diet that limits fat and cholesterol may help lower your risk for heart disease and other conditions. Your body needs fat and cholesterol for basic functions, but eating too much of these things can be harmful to your health. Your health care provider may order lab tests to check your blood fat (lipid) and cholesterol levels. This helps your health care provider understand your risk for certain conditions and whether you need to make diet changes. Work with your health care provider or dietitian to make an eating plan that is right for you. Your plan includes: Limit your fat intake to ______% or less of your total calories a day. This is ______g of fat per day. Limit your saturated fat intake to ______% or less of your total calories a day. This is ______g of saturated fat per day. Limit the amount of cholesterol in your diet to less than _________mg a day. Eat ___________ g of fiber  a day. What are tips for following this plan? General guidelines If you are overweight, work with your health care provider to lose weight safely. Losing just 5-10% of your body weight can improve your overall health and help prevent diseases such as diabetes and heart disease. Avoid: Foods with added sugar. Fried foods. Foods that contain partially hydrogenated oils, including stick margarine, some tub margarines, cookies, crackers, and other baked goods. If you drink alcohol: Limit how much you have to: 0-1 drink a day for women who are not pregnant. 0-2 drinks a day for men. Know how much alcohol is in a drink. In the [place] one drink equals one 12 oz  bottle of beer (355 mL), one 5 oz glass of wine (148 mL), or one 1 oz glass of hard liquor (44 mL). Reading food labels Check food labels for: Trans fats or partially hydrogenated oils. Avoid foods that contain these. High amounts of saturated fat. Choose foods that are low in saturated fat (less than 2 g). The amount of cholesterol in each serving. The amount of fiber in each serving. Choose foods with healthy fats, such as: Monounsaturated and polyunsaturated fats. These include olive and canola oil, flaxseeds, walnuts, almonds, and seeds. Omega-3 fats. These are found in foods such as salmon, mackerel, sardines, tuna, flaxseed oil, and ground flaxseeds. Choose grain products that have whole grains. Look for the word "whole" as the first word in the ingredient list. Cooking Cook foods using methods other than frying. Baking, boiling, grilling, and broiling are some healthy options. Eat more home-cooked food and less restaurant, buffet, and fast food. Avoid cooking using saturated fats. Animal sources of saturated fats include meats, butter, and cream. Plant sources of saturated fats include palm oil, palm kernel oil, and coconut oil. Meal planning  At meals, imagine dividing your plate into fourths: Fill one-half of your plate with vegetables, green salads, and fruit. Fill one-fourth of your plate with whole grains. Fill one-fourth of your plate with lean protein foods. Eat fish that is high in omega-3 fats at least two times a week. Eat more foods that contain fiber, such as whole grains, beans, apples, pears, berries, broccoli, carrots, peas, and barley. These foods help promote healthy cholesterol levels in the blood. What foods should I eat? Fruits All fresh, canned (in natural juice), or frozen fruits. Vegetables Fresh or frozen vegetables (raw, steamed, roasted, or grilled). Green salads. Grains Whole grains, such as whole wheat or whole grain breads, crackers, cereals, and  pasta. Unsweetened oatmeal, bulgur, barley, quinoa, or brown rice. Corn or whole wheat flour tortillas. Meats and other proteins Ground beef (85% or leaner), grass-fed beef, or beef trimmed of fat. Skinless chicken or [place]. Ground chicken or [place]. Pork trimmed of fat. All fish and seafood. Egg whites. Dried beans, peas, or lentils. Unsalted nuts or seeds. Unsalted canned beans. Natural nut butters without added sugar and oil. Dairy Low-fat or nonfat dairy products, such as skim or 1% milk, 2% or reduced-fat cheeses, low-fat and fat-free ricotta or cottage cheese, or plain low-fat and nonfat yogurt. Fats and oils Tub margarine without trans fats. Light or reduced-fat mayonnaise and salad dressings. Avocado. Olive, canola, sesame, or safflower oils. The items listed above may not be a complete list of foods and beverages you can eat. Contact a dietitian for more information. What foods should I avoid? Fruits Canned fruit in heavy syrup. Fruit in cream or butter sauce. Fried fruit. Vegetables Vegetables cooked in cheese, cream, or butter sauce.  Fried vegetables. Grains White bread. White pasta. White rice. Cornbread. Bagels, pastries, and croissants. Crackers and snack foods that contain trans fat and hydrogenated oils. Meats and other proteins Fatty cuts of meat. Ribs, chicken wings, bacon, sausage, bologna, salami, chitterlings, fatback, hot dogs, bratwurst, and packaged lunch meats. Liver and organ meats. Whole eggs and egg yolks. Chicken and [place] with skin. Fried meat. Dairy Whole or 2% milk, cream, half-and-half, and cream cheese. Whole milk cheeses. Whole-fat or sweetened yogurt. Full-fat cheeses. Nondairy creamers and whipped toppings. Processed cheese, cheese spreads, and cheese curds. Fats and oils Butter, stick margarine, lard, shortening, ghee, or bacon fat. Coconut, palm kernel, and palm oils. Beverages Alcohol. Sugar-sweetened drinks such as sodas, lemonade, and fruit  drinks. Sweets and desserts Corn syrup, sugars, honey, and molasses. Candy. Jam and jelly. Syrup. Sweetened cereals. Cookies, pies, cakes, donuts, muffins, and ice cream. The items listed above may not be a complete list of foods and beverages you should avoid. Contact a dietitian for more information. Summary Your body needs fat and cholesterol for basic functions. However, eating too much of these things can be harmful to your health. Work with your health care provider and dietitian to follow a diet that limits fat and cholesterol. Doing this may help lower your risk for heart disease and other conditions. Choose healthy fats, such as monounsaturated and polyunsaturated fats, and foods high in omega-3 fatty acids. Eat fiber-rich foods, such as whole grains, beans, peas, fruits, and vegetables. Limit or avoid alcohol, fried foods, and foods high in saturated fats, partially hydrogenated oils, and sugar. This information is not intended to replace advice given to you by your health care provider. Make sure you discuss any questions you have with your health care provider. Document Revised: 11/06/2020 Document Reviewed: 11/06/2020 Elsevier Patient Education  [place].

## 2022-08-12 NOTE — Progress Notes (Signed)
Complete physical exam  Patient: Jasmine Neal   DOB: 09-Feb-1946   77 y.o. Female  MRN: [id number]  Subjective:    Chief Complaint  Patient presents with   Annual Exam    Darriana Gwendolen Hewlett is a 77 y.o. female who presents today for a complete physical exam. She reports consuming a general diet. The patient does not participate in regular exercise at present. Her dog died and she has not walked since. She feels upbeat and happy. Has a great man in her life.  She generally feels well. She reports sleeping well. She does not have additional problems to discuss today. However, she has right shoulder pain that has been ongoing for months.   Also reports that she has more bloating and pain in her left lower abdomen. She reports the shape of her stool changing. No blood. Would like to see GI    Most recent fall risk assessment:    02/10/2022   10:37 AM  Fall Risk   Falls in the past year? 0  Number falls in past yr: 0  Injury with Fall? 0  Risk for fall due to : No Fall Risks  Follow up Falls evaluation completed     Most recent depression screenings:    02/10/2022   10:37 AM 07/21/2021   11:05 AM  PHQ 2/9 Scores  PHQ - 2 Score 0 0  PHQ- 9 Score 3 4        Patient Care Team: Panosh, Standley Brooking, MD as PCP - Adelene Idler, MD Martinique, Amy, MD as Consulting Physician (Dermatology) Marygrace Drought, MD as Consulting Physician (Ophthalmology) Clent Jacks, MD as Consulting Physician (Ophthalmology) Brand Males, MD as Consulting Physician (Pulmonary Disease) Thornton Park, MD as Consulting Physician (Gastroenterology)   Outpatient Medications Prior to Visit  Medication Sig   fluticasone furoate-vilanterol (BREO ELLIPTA) 200-25 MCG/ACT AEPB Inhale 1 puff into the lungs daily.   acetaminophen (TYLENOL) 500 MG tablet Take 1,000 mg by mouth daily as needed. pain   atorvastatin (LIPITOR) 20 MG tablet Take 1 tablet (20 mg total) by mouth daily. KEEP SCHEDULED  APPT FOR FUTURE REFILLS   Cholecalciferol (VITAMIN D3) 50 MCG (2000 UT) TABS Take by mouth daily.   diphenhydramine-acetaminophen (TYLENOL PM) 25-500 MG TABS Take 1 tablet by mouth at bedtime as needed. sleep   PARoxetine (PAXIL) 40 MG tablet TAKE 1 TABLET AT BEDTIME   SYNTHROID 88 MCG tablet TAKE 1 TABLET (88 MCG TOTAL) BY MOUTH DAILY.   valACYclovir (VALTREX) 1000 MG tablet TAKE 2 TABLETS BY MOUTH TWICE DAILY AS DIRECTED   vitamin B-12 (CYANOCOBALAMIN) 1000 MCG tablet Take 1,000 mcg by mouth daily.   vitamin C (ASCORBIC ACID) 500 MG tablet Take 500 mg by mouth daily.   [DISCONTINUED] BREO ELLIPTA 100-25 MCG/INH AEPB INHALE 1 PUFF ONE TIME DAILY   Facility-Administered Medications Prior to Visit  Medication Dose Route Frequency Provider   0.9 %  sodium chloride infusion  500 mL Intravenous Once Thornton Park, MD   0.9 %  sodium chloride infusion  500 mL Intravenous Once Thornton Park, MD    Review of Systems  Gastrointestinal:  Positive for abdominal pain.       Abdominal bloating   Musculoskeletal:        Right shoulder pain  All other systems reviewed and are negative.         Objective:     BP (!) 142/84   Pulse 78   Temp 98 F (36.7 C)  Resp 18   Ht '5\' 8"'$  (1.727 m)   Wt 161 lb (73 kg)   SpO2 98%   BMI 24.48 kg/m    Physical Exam Vitals and nursing note reviewed.  Constitutional:      Appearance: Normal appearance. She is normal weight.  HENT:     Head: Normocephalic and atraumatic.     Right Ear: Tympanic membrane, ear canal and external ear normal.     Left Ear: Tympanic membrane, ear canal and external ear normal.     Nose: Nose normal.     Mouth/Throat:     Mouth: Mucous membranes are moist.     Pharynx: Oropharynx is clear.  Eyes:     Extraocular Movements: Extraocular movements intact.     Conjunctiva/sclera: Conjunctivae normal.     Pupils: Pupils are equal, round, and reactive to light.  Cardiovascular:     Rate and Rhythm: Normal rate  and regular rhythm.     Pulses: Normal pulses.     Heart sounds: Normal heart sounds.  Pulmonary:     Effort: Pulmonary effort is normal.     Breath sounds: Normal breath sounds.  Abdominal:     General: Abdomen is flat. Bowel sounds are normal. There is no distension.     Palpations: Abdomen is soft.     Tenderness: There is no abdominal tenderness. There is no guarding.  Musculoskeletal:        General: Normal range of motion.     Cervical back: Normal range of motion and neck supple.  Skin:    General: Skin is warm and dry.  Neurological:     General: No focal deficit present.     Mental Status: She is alert and oriented to person, place, and time.  Psychiatric:        Mood and Affect: Mood normal.        Behavior: Behavior normal.      No results found for any visits on 08/12/22.     Assessment & Plan:    Routine Health Maintenance and Physical Exam  Immunization History  Administered Date(s) Administered   Fluad Quad(high Dose 65+) 03/12/2019, 03/30/2020, 04/19/2021   Hepatitis A 08/30/2017   Hepatitis A, Adult 09/07/2017, 03/21/2018   Influenza Whole 04/10/2002, 06/01/2009, 05/05/2010, 05/22/2012, 02/20/2017   Influenza, High Dose Seasonal PF 04/22/2016   Influenza,inj,Quad PF,6+ Mos 06/04/2013   Influenza-Unspecified 05/23/2011   PFIZER Comirnaty(Gray Top)Covid-19 Tri-Sucrose Vaccine 10/27/2020   PFIZER(Purple Top)SARS-COV-2 Vaccination 08/03/2019, 08/24/2019, 04/22/2020   Pneumococcal Conjugate-13 10/09/2013   Pneumococcal Polysaccharide-23 05/05/2010   Td 08/11/2006, 02/27/2017   Typhoid Inactivated 09/07/2017   Typhoid Live 09/07/2017   Varicella 09/11/2012   Zoster Recombinat (Shingrix) 06/06/2018, 03/14/2019   Zoster, Live 09/11/2012, 04/02/2018    Health Maintenance  Topic Date Due   Medicare Annual Wellness (AWV)  09/29/2021   COVID-19 Vaccine (5 - 2023-24 season) 03/11/2022   INFLUENZA VACCINE  10/09/2022 (Originally 02/08/2022)   DTaP/Tdap/Td (3  - Tdap) 02/28/2027   Pneumonia Vaccine 68+ Years old  Completed   DEXA SCAN  Completed   Hepatitis C Screening  Completed   Zoster Vaccines- Shingrix  Completed   HPV VACCINES  Aged Out   COLONOSCOPY (Pts 45-80yr Insurance coverage will need to be confirmed)  Discontinued    Discussed health benefits of physical activity, and encouraged her to engage in regular exercise appropriate for her age and condition.  Problem List Items Addressed This Visit     Hypothyroidism - Primary  Hyperlipidemia   COPD, moderate (HCC)   Relevant Medications   fluticasone furoate-vilanterol (BREO ELLIPTA) 200-25 MCG/ACT AEPB   BREAST CANCER, HX OF   Relevant Orders   Mammogram Digital Diagnostic Bilateral   Other Visit Diagnoses     Right shoulder pain, unspecified chronicity       Encounter for screening mammogram for malignant neoplasm of breast       Relevant Orders   Mammogram Digital Diagnostic Bilateral   Abdominal bloating       Relevant Orders   Ambulatory referral to Gastroenterology   Routine general medical examination at a health care facility       Relevant Orders   Ambulatory referral to Gastroenterology      Return in about 6 months (around 02/10/2023). Call the office if right shoulder pain persists. Follow-up with GI.     Kennyth Arnold, FNP

## 2022-08-15 ENCOUNTER — Other Ambulatory Visit: Payer: Self-pay | Admitting: Internal Medicine

## 2022-08-15 DIAGNOSIS — Z1231 Encounter for screening mammogram for malignant neoplasm of breast: Secondary | ICD-10-CM

## 2022-09-21 ENCOUNTER — Encounter: Payer: Self-pay | Admitting: Gastroenterology

## 2022-09-21 ENCOUNTER — Other Ambulatory Visit (INDEPENDENT_AMBULATORY_CARE_PROVIDER_SITE_OTHER): Payer: Medicare PPO

## 2022-09-21 ENCOUNTER — Ambulatory Visit: Payer: Medicare PPO | Admitting: Gastroenterology

## 2022-09-21 VITALS — BP 136/78 | HR 77 | Ht 68.0 in | Wt 162.0 lb

## 2022-09-21 DIAGNOSIS — R1032 Left lower quadrant pain: Secondary | ICD-10-CM

## 2022-09-21 DIAGNOSIS — R194 Change in bowel habit: Secondary | ICD-10-CM

## 2022-09-21 LAB — BASIC METABOLIC PANEL
BUN: 16 mg/dL (ref 6–23)
CO2: 30 mEq/L (ref 19–32)
Calcium: 10.2 mg/dL (ref 8.4–10.5)
Chloride: 103 mEq/L (ref 96–112)
Creatinine, Ser: 0.87 mg/dL (ref 0.40–1.20)
GFR: 64.08 mL/min (ref >60.00)
Glucose, Bld: 86 mg/dL (ref 70–99)
Potassium: 4.2 mEq/L (ref 3.5–5.1)
Sodium: 140 mEq/L (ref 135–145)

## 2022-09-21 NOTE — Patient Instructions (Signed)
High fiber diet, consider daily addition of psyllium or methycellulose, drink at least 64 ounces of water  Follow-up with Dr. Regis Bill to consider if pain is referred from her back given the concurrent symptoms  Your provider has requested that you go to the basement level for lab work before leaving today. Press "B" on the elevator. The lab is located at the first door on the left as you exit the elevator.   You have been scheduled for a CT scan of the abdomen and pelvis at [facility], 1st floor Radiology. You are scheduled on Friday 09/30/22 at 9:30 am. Please arrive by 7:30 am (2 hours prior) to drink contrast.   Please follow the written instructions below on the day of your exam:   1) Do not eat anything after 5:30 am (4 hours prior to your test)   You may take any medications as prescribed with a small amount of water, if necessary. If you take any of the following medications: METFORMIN, GLUCOPHAGE, GLUCOVANCE, AVANDAMET, RIOMET, FORTAMET, [place] MET, JANUMET, GLUMETZA or METAGLIP, you MAY be asked to HOLD this medication 48 hours AFTER the exam.   The purpose of you drinking the oral contrast is to aid in the visualization of your intestinal tract. The contrast solution may cause some diarrhea. Depending on your individual set of symptoms, you may also receive an intravenous injection of x-ray contrast/dye. Plan on being at [facility] for 45 minutes or longer, depending on the type of exam you are having performed.   If you have any questions regarding your exam or if you need to reschedule, you may call [facility] at [phone] between the hours of 8:00 am and 5:00 pm, Monday-Friday.

## 2022-09-21 NOTE — Progress Notes (Signed)
Referring Provider: Burnis Medin, MD Primary Care Physician:  Burnis Medin, MD   Chief complaint: Lower abdominal pain   IMPRESSION:  LLQ abdominal pain and bloating Change in bowel habits Diverticulosis without history of diverticulitis Family history of liver cancer (sister) Family history of pancreatic cancer (nephew) Reflux esophagitis by EGD 2021 Abnormal esophageal findings on CT scan    - CT of the chest 06/25/2019: esophageal air-fluid levels suggesting dysmotility or esophageal reflux    - No associated symptoms History of abnormal liver enzymes    - isolated ALT 37 02/2016, 102 03/2016    - all repeat testing has been normal until 03/2019    - ultrasound and MRI 2017 were essentially normal    - hepatitis C antibody negative 09/11/2012    - ANA negative 05/15/2016    - celiac disease serology 08/30/16: TTGA negative, IgA 206    - 5' nucleotidase 11 (normal 0-10)    - ongoing alcohol use    - FibroSURE showed steatosis without NASH    - Elastography showed low probability of liver disease    - liver enzymes now normal HOMA-IR of 5.6 Internal hemorrhoids History of colon polyp 2014    - colonoscopy with Dr. Allyn Kenner    - pathology results not known    - no polyps on colonoscopy 2000    - Medhoff suggested follow-up colonoscopy in 2019-2024    - no polyps but polypoid mucosa on colonoscopy 08/2019    PLAN: - High fiber diet, consider daily addition of psyllium or methycellulose, drink at least 64 ounces of water - CT abd/pelvis with contrast to evaluate for diverticulitis and other possible etiologies of pain as she has not had prior cross-sectional abdominal/pelvic imaging - Colonoscopy if CT is negative - Follow-up with Dr. Regis Bill to consider if pain is referred from her back given the concurrent symptoms   HPI: Jasmine Neal is a 77 y.o. who is referred by Dr. Regis Bill for further evaluation of abdominal bloating.  The patient was last seen in 2021 for an  abnormal CT scan and abnormal liver enzymes.  The interval history is obtained through the patient and review of her electronic health record. Previously under the care of Dr. Allyn Kenner. History of anxiety, arthritis, asthma, breast cancer s/p lumpectomy 1990, diverticulosis, hypercholesterolemia, and hypothyroidism. She is followed by pulmonary for multiple lung nodules on CT.   History of elevated liver enzymes. Elevated several years ago and again last year. Intermittently normal.  Previously evaluated by Dr. Allyn Kenner, most recently in 2018. Told she had fatty liver. Also suggested celiac, but, testing for that was negative. Liver enzymes are now normal again.  Ultrasound and MRI in 2017 were normal. No known family history of liver disease. No risk factors for chronic viral hepatitis. Prior screening for HCV was negative. No history of jaundice or scleral icterus. Drinking one glass of red wine with dinner historically. No symptoms that she associates with elevated liver enzymes.   Review of EPIC shows an isolated ALT 37 02/2016 and  ALT 102 03/2016. They have been normal since that time until September.  Recent labs 03/12/19: AST 42, ALT 46, alk phos 96, TB 0.5. Labs 05/14/2019 show a glucose of 87, iron 105, ferritin 77, IgG 881, IgM 66, ANA negative, IgA 263, AMA less than 20, hep B surface antigen negative hep B core antibody total nonreactive, insulin 26, F-actin less than 20. Labs 07/22/19: AST 20, ALT 21, alk phos 89, TB  0.4, TP 7.1, alb 4.4 FibroSURE F1-F2 fibrosis, S1-S2 minimal-moderate steatosis, N0 - not NASH  Elastography 05/03/2019 showed a normal sonographic appearance of the liver.  The medium kPa was 3.4. This has a high probability of being normal without advanced liver disease or fibrosis noted.  CT of the chest 06/25/2019 showed stable pulmonary nodules, aortic atherosclerosis, and esophageal air-fluid levels suggesting dysmotility or esophageal reflux.  She presents today with concerns  for increasing bloating and left lower abdominal pain since October after a trip to [place] and [place].  She feels the shape of her stools has changed. Loose stools, skinny stools, diarrhea. Over the last 3-4 weeks things are more normal.  There is been no blood or mucus in the stool. She has concurrent back pain and left hip pain but she does not feel they are related. She is controlling   Appetite is good. Weight is stable. She notes that her diet has not been good since she returned as she has enjoyed eating scones. She does feel more fatigued on some days. No other localizing symptoms.   Her sister died of liver cancer two months ago and her nephew died of pancreatic cancer a few months ago.   Labs 08/05/22 showed a normal CMP and CBC  Prior endoscopy history: Colonoscopy with Dr. Allyn Kenner 2000: diverticulosis. No hemorrhoids mentioned.  Colonoscopy with Dr. Allyn Kenner 12/28/00: Normal, repeat in 5 years given history of breast cancer Colonoscopy with Dr. Allyn Kenner 2014: small polyp, diverticulosis. No hemorrhoids mentioned.  Colonoscopy 08/14/19: internal hemorrhoids, pancolonic diverticulosis, melanosis coli, 4 small polyps - no polyps seen on pathology EGD 01/01/2020 was normal.  Esophageal biopsies confirmed reflux.  There was no eosinophilic esophagitis.  Prior abdominal imaging: - Abdominal ultrasound 04/11/16: heterogeneous hepatic echotexture without discrete mass. No gallstones.  - MRI 05/10/16: no steatosis; slightly reduced in phase parenchymal signal intensity in the liver - ? Hemochromatosis although this is not supported by other sequences - Elastography 05/03/2019:  normal sonographic appearance of the liver.  The medium kPa was 3.4. This has a high probability of being normal without advanced liver disease or fibrosis noted.  - CT of the chest 06/25/2019: stable pulmonary nodules, aortic atherosclerosis, and esophageal air-fluid levels suggesting dysmotility or esophageal  reflux.   Past Medical History:  Diagnosis Date   Anxiety    Asthma    evalutated by pulmonary severe by spirometry no smoker currently   Breast cancer ([place])    hx of right lumpectomy, 1990 IIA radiation cmfp ajunct rx   Cataract    removed from both eyes   Chronic airway obstruction, not elsewhere classified    Coronary artery calcification seen on CAT scan 01/23/2018   Diverticulosis    only has like 2   Dysplasia    hx treated with cerv conization   Family history of breast cancer    Family history of pancreatic cancer    Fatty liver    US done 2010   Fibromyalgia    Hx of nonmelanoma skin cancer     followed dr Martinique    Hyperlipidemia    Hypothyroidism    Malignant neoplasm of breast (female), unspecified site 11/28/2013   Osteoarthritis    Osteopenia    REM sleep behavior disorder    Skin cancer    squamous cell    Past Surgical History:  Procedure Laterality Date   BREAST LUMPECTOMY     on right, 1990 IIA radiation cmfp ajuction rx   COLONOSCOPY  KNEE SURGERY  2012   NASAL SINUS SURGERY  2007   on right done by Dr. Wilburn Cornelia    Current Outpatient Medications  Medication Sig Dispense Refill   acetaminophen (TYLENOL) 500 MG tablet Take 1,000 mg by mouth daily as needed. pain     atorvastatin (LIPITOR) 20 MG tablet Take 1 tablet (20 mg total) by mouth daily. KEEP SCHEDULED APPT FOR FUTURE REFILLS 90 tablet 0   Cholecalciferol (VITAMIN D3) 50 MCG (2000 UT) TABS Take by mouth daily.     diphenhydramine-acetaminophen (TYLENOL PM) 25-500 MG TABS Take 1 tablet by mouth at bedtime as needed. sleep     fluticasone furoate-vilanterol (BREO ELLIPTA) 200-25 MCG/ACT AEPB Inhale 1 puff into the lungs daily.     PARoxetine (PAXIL) 40 MG tablet TAKE 1 TABLET AT BEDTIME 90 tablet 0   SYNTHROID 88 MCG tablet TAKE 1 TABLET (88 MCG TOTAL) BY MOUTH DAILY. 90 tablet 0   valACYclovir (VALTREX) 1000 MG tablet TAKE 2 TABLETS BY MOUTH TWICE DAILY AS DIRECTED 90 tablet prn    vitamin B-12 (CYANOCOBALAMIN) 1000 MCG tablet Take 1,000 mcg by mouth daily.     vitamin C (ASCORBIC ACID) 500 MG tablet Take 500 mg by mouth daily.     Current Facility-Administered Medications  Medication Dose Route Frequency Provider Last Rate Last Admin   0.9 %  sodium chloride infusion  500 mL Intravenous Once Thornton Park, MD       0.9 %  sodium chloride infusion  500 mL Intravenous Once Thornton Park, MD        Allergies as of 09/21/2022 - Review Complete 09/21/2022  Allergen Reaction Noted   Hydrocodone Itching 11/08/2010    Family History  Problem Relation Age of Onset   Breast cancer Mother 43       possible uterine cancer diagnosis at age 36   Osteoporosis Mother        no hip fracture   Other Father        mysthenia gravis   Lung cancer Sister    Breast cancer Sister 42   Lung cancer Sister 19       former smoker   Lung cancer Paternal Grandmother    Osteoporosis Other    Thyroid disease Other    Breast cancer Other        MGMs sister   Colon cancer Neg Hx    Esophageal cancer Neg Hx    Stomach cancer Neg Hx    Rectal cancer Neg Hx       Physical Exam: Gen: Awake, alert, and oriented, and well communicative. HEENT: EOMI, non-icteric sclera, NCAT, MMM  Neck: Normal movement of head and neck  Pulm: No labored breathing, speaking in full sentences without conversational dyspnea  Abd: soft, nontender, non distended, normal bowel sounds, I am unable to recreate her abdominal pain Derm: No apparent lesions or bruising in visible field  MS: Moves all visible extremities without noticeable abnormality  Psych: Pleasant, cooperative, normal speech, thought processing seemingly intact   I spent 32 minutes, including in depth chart review, independent review of results, communicating results with the patient directly, face-to-face time with the patient, coordinating care, and ordering studies and medications as appropriate, and documentation.   Georgiann Neider L.  Tarri Glenn, MD, MPH [facility] 09/21/2022, 9:30 AM

## 2022-09-30 ENCOUNTER — Ambulatory Visit (HOSPITAL_COMMUNITY)
Admission: RE | Admit: 2022-09-30 | Discharge: 2022-09-30 | Disposition: A | Discharge status: Home / Self Care | Payer: Medicare PPO | Source: Ambulatory Visit | Attending: Gastroenterology | Admitting: Gastroenterology

## 2022-09-30 DIAGNOSIS — R194 Change in bowel habit: Secondary | ICD-10-CM | POA: Insufficient documentation

## 2022-09-30 DIAGNOSIS — K5730 Diverticulosis of large intestine without perforation or abscess without bleeding: Secondary | ICD-10-CM | POA: Diagnosis not present

## 2022-09-30 DIAGNOSIS — R1032 Left lower quadrant pain: Secondary | ICD-10-CM | POA: Insufficient documentation

## 2022-09-30 MED ORDER — IOHEXOL 300 MG/ML  SOLN
100.0000 mL | Freq: Once | INTRAMUSCULAR | Status: AC | PRN
  Administered 2022-09-30: 100 mL via INTRAVENOUS

## 2022-09-30 MED ORDER — SODIUM CHLORIDE (PF) 0.9 % IJ SOLN
INTRAMUSCULAR | Status: AC
  Filled 2022-09-30: qty 50

## 2022-09-30 MED ORDER — IOHEXOL 9 MG/ML PO SOLN
ORAL | Status: AC
  Filled 2022-09-30: qty 1000

## 2022-09-30 MED ORDER — IOHEXOL 9 MG/ML PO SOLN
1000.0000 mL | ORAL | Status: AC
  Administered 2022-09-30: 1000 mL via ORAL

## 2022-10-02 ENCOUNTER — Other Ambulatory Visit: Payer: Self-pay | Admitting: Internal Medicine

## 2022-10-05 ENCOUNTER — Telehealth: Payer: Self-pay | Admitting: Gastroenterology

## 2022-10-05 NOTE — Telephone Encounter (Signed)
Patient called to return phone call regarding recent CT Results.

## 2022-10-06 ENCOUNTER — Encounter: Payer: Self-pay | Admitting: *Deleted

## 2022-10-06 ENCOUNTER — Ambulatory Visit
Admission: RE | Admit: 2022-10-06 | Discharge: 2022-10-06 | Disposition: A | Discharge status: Home / Self Care | Payer: Medicare PPO | Source: Ambulatory Visit | Attending: Internal Medicine | Admitting: Internal Medicine

## 2022-10-06 ENCOUNTER — Other Ambulatory Visit: Payer: Self-pay | Admitting: *Deleted

## 2022-10-06 DIAGNOSIS — R194 Change in bowel habit: Secondary | ICD-10-CM

## 2022-10-06 DIAGNOSIS — Z1231 Encounter for screening mammogram for malignant neoplasm of breast: Secondary | ICD-10-CM | POA: Diagnosis not present

## 2022-10-06 DIAGNOSIS — R1032 Left lower quadrant pain: Secondary | ICD-10-CM

## 2022-10-06 MED ORDER — NA SULFATE-K SULFATE-MG SULF 17.5-3.13-1.6 GM/177ML PO SOLN: 1.0000 | Freq: Once | ORAL | 0 refills | Status: AC

## 2022-10-06 NOTE — Telephone Encounter (Signed)
See result note.  

## 2022-10-13 ENCOUNTER — Other Ambulatory Visit: Payer: Self-pay | Admitting: Internal Medicine

## 2022-10-25 ENCOUNTER — Encounter: Payer: Self-pay | Admitting: Gastroenterology

## 2022-10-25 ENCOUNTER — Ambulatory Visit (AMBULATORY_SURGERY_CENTER): Payer: Medicare PPO | Admitting: Gastroenterology

## 2022-10-25 VITALS — BP 170/62 | HR 71 | Temp 98.4°F | Resp 16 | Ht 68.0 in | Wt 162.0 lb

## 2022-10-25 DIAGNOSIS — D120 Benign neoplasm of cecum: Secondary | ICD-10-CM

## 2022-10-25 DIAGNOSIS — R1032 Left lower quadrant pain: Secondary | ICD-10-CM

## 2022-10-25 DIAGNOSIS — R194 Change in bowel habit: Secondary | ICD-10-CM

## 2022-10-25 DIAGNOSIS — D122 Benign neoplasm of ascending colon: Secondary | ICD-10-CM | POA: Diagnosis not present

## 2022-10-25 DIAGNOSIS — E785 Hyperlipidemia, unspecified: Secondary | ICD-10-CM | POA: Diagnosis not present

## 2022-10-25 DIAGNOSIS — E039 Hypothyroidism, unspecified: Secondary | ICD-10-CM | POA: Diagnosis not present

## 2022-10-25 DIAGNOSIS — K635 Polyp of colon: Secondary | ICD-10-CM | POA: Diagnosis not present

## 2022-10-25 MED ORDER — SODIUM CHLORIDE 0.9 % IV SOLN
500.0000 mL | INTRAVENOUS | Status: DC
Start: 2022-10-25 — End: 2022-10-25

## 2022-10-25 NOTE — Progress Notes (Signed)
Report to PACU, RN, vss, BBS= Clear.  

## 2022-10-25 NOTE — Op Note (Signed)
[facility] Patient Name: Jasmine Neal Procedure Date: 10/25/2022 7:56 AM MRN: [id number] Endoscopist: Tressia Danas MD, MD, [id number] Age: 77 Referring MD:  Date of Birth: [birth year] Gender: Female Account #: [id number] Procedure:                Colonoscopy Indications:              Lower abdominal pain, Change in bowel habits                           Colonoscopy 2021 revealed 4 "polyps' that were                            actually lymphoid aggregates Medicines:                Monitored Anesthesia Care Procedure:                Pre-Anesthesia Assessment:                           - Prior to the procedure, a History and Physical                            was performed, and patient medications and                            allergies were reviewed. The patient's tolerance of                            previous anesthesia was also reviewed. The risks                            and benefits of the procedure and the sedation                            options and risks were discussed with the patient.                            All questions were answered, and informed consent                            was obtained. Prior Anticoagulants: The patient has                            taken no anticoagulant or antiplatelet agents. ASA                            Grade Assessment: II - A patient with mild systemic                            disease. After reviewing the risks and benefits,                            the patient was deemed in satisfactory condition to  undergo the procedure.                           After obtaining informed consent, the colonoscope                            was passed under direct vision. Throughout the                            procedure, the patient's blood pressure, pulse, and                            oxygen saturations were monitored continuously. The                            CF [id number] #[id number] was introduced  through the anus                            and advanced to the 2 cm into the ileum. A second                            forward view of the right colon was performed. The                            colonoscopy was performed without difficulty. The                            patient tolerated the procedure well. The quality                            of the bowel preparation was excellent. The                            terminal ileum, ileocecal valve, appendiceal                            orifice, and rectum were photographed. Scope In: 8:27:17 AM Scope Out: 8:48:39 AM Scope Withdrawal Time: 0 hours 16 minutes 14 seconds  Total Procedure Duration: 0 hours 21 minutes 22 seconds  Findings:                 The perianal and digital rectal examinations were                            normal.                           Non-bleeding internal hemorrhoids were found.                           Multiple medium-mouthed and small-mouthed                            diverticula were found in the entire colon. The  diverticulosis is most dense in the sigmoid colon.                           Three sessile polyps were found in the ascending                            colon and cecum. The polyps were less than 1 mm in                            size. These polyps were removed with a cold snare.                            Resection and retrieval were complete, although one                            of the polyps may not have been retrieved.                            Estimated blood loss was minimal.                           The entire colon was otherwise normal. Biopsies for                            histology were taken with a cold forceps from the                            right colon and left colon for histology. Complications:            No immediate complications. Estimated Blood Loss:     Estimated blood loss was minimal. Estimated blood                            loss  was minimal. Impression:               - Non-bleeding internal hemorrhoids.                           - Diverticulosis in the entire examined colon.                           - Three less than 1 mm polyps in the ascending                            colon and in the cecum, removed with a cold snare.                            Resected and retrieved. Recommendation:           - Patient has a contact number available for                            emergencies. The signs and symptoms of potential  delayed complications were discussed with the                            patient. Return to normal activities tomorrow.                            Written discharge instructions were provided to the                            patient.                           - High fiber diet.                           - Continue present medications.                           - Await pathology results.                           - Repeat colonoscopy for colon cancer screening is                            not recommended due to age >55.                           - Follow a high fiber diet. Drink at least 64                            ounces of water daily. Add a daily stool bulking                            agent such as psyllium (an exampled would be                            Metamucil).                           - Emerging evidence supports eating a diet of                            fruits, vegetables, grains, calcium, and yogurt                            while reducing red meat and alcohol may reduce the                            risk of colon cancer.                           - Office follow-up. Tressia Danas MD, MD 10/25/2022 8:58:45 AM This report has been signed electronically.

## 2022-10-25 NOTE — Progress Notes (Signed)
Pt. states no medical or surgical changes since previsit or office visit. 

## 2022-10-25 NOTE — Progress Notes (Signed)
Called to room to assist during endoscopic procedure.  Patient ID and intended procedure confirmed with present staff. Received instructions for my participation in the procedure from the performing physician.  

## 2022-10-25 NOTE — Patient Instructions (Addendum)
Resume all of your current medications.    YOU HAD AN ENDOSCOPIC PROCEDURE TODAY AT THE [facility]:   Refer to the procedure report that was given to you for any specific questions about what was found during the examination.  If the procedure report does not answer your questions, please call your gastroenterologist to clarify.  If you requested that your care partner not be given the details of your procedure findings, then the procedure report has been included in a sealed envelope for you to review at your convenience later.  YOU SHOULD EXPECT: Some feelings of bloating in the abdomen. Passage of more gas than usual.  Walking can help get rid of the air that was put into your GI tract during the procedure and reduce the bloating. If you had a lower endoscopy (such as a colonoscopy or flexible sigmoidoscopy) you may notice spotting of blood in your stool or on the toilet paper. If you underwent a bowel prep for your procedure, you may not have a normal bowel movement for a few days.  Please Note:  You might notice some irritation and congestion in your nose or some drainage.  This is from the oxygen used during your procedure.  There is no need for concern and it should clear up in a day or so.  SYMPTOMS TO REPORT IMMEDIATELY:  Following lower endoscopy (colonoscopy or flexible sigmoidoscopy):  Excessive amounts of blood in the stool  Significant tenderness or worsening of abdominal pains  Swelling of the abdomen that is new, acute  Fever of 100F or higher   For urgent or emergent issues, a gastroenterologist can be reached at any hour by calling [phone]. Do not use MyChart messaging for urgent concerns.    DIET:  We do recommend a small meal at first, but then you may proceed to your regular diet.  Drink plenty of fluids but you should avoid alcoholic beverages for 24 hours.  Increase the fiber in your diet, and drink plenty of water.  ACTIVITY:  You should plan to  take it easy for the rest of today and you should NOT DRIVE or use heavy machinery until tomorrow (because of the sedation medicines used during the test).    FOLLOW UP: Our staff will call the number listed on your records the next business day following your procedure.  We will call around 7:15- 8:00 am to check on you and address any questions or concerns that you may have regarding the information given to you following your procedure. If we do not reach you, we will leave a message.     If any biopsies were taken you will be contacted by phone or by letter within the next 1-3 weeks.  Please call us at [phone] if you have not heard about the biopsies in 3 weeks.    SIGNATURES/CONFIDENTIALITY: You and/or your care partner have signed paperwork which will be entered into your electronic medical record.  These signatures attest to the fact that that the information above on your After Visit Summary has been reviewed and is understood.  Full responsibility of the confidentiality of this discharge information lies with you and/or your care-partner.

## 2022-10-25 NOTE — Progress Notes (Signed)
Referring Provider: Madelin Headings, MD Primary Care Physician:  Madelin Headings, MD  Indication for Colonoscopy: Left lower quadrant abdominal pain, bloating, change in bowel habits  IMPRESSION:   Need for colon cancer screening Appropriate candidate for monitored anesthesia care  PLAN: Colonoscopy in the [facility] today   HPI: Jasmine Neal is a 77 y.o. female presents for diagnostic colonoscopy for lower abdominal pain with bloating and change in bowel habits.  Recently seen for increasing bloating and left lower abdominal pain since October after a trip to [place] and [place].  She feels the shape of her stools has changed. Loose stools, skinny stools, diarrhea. Over the last 3-4 weeks things are more normal.  There is been no blood or mucus in the stool. She has concurrent back pain and left hip pain but she does not feel they are related. She is controlling    Appetite is good. Weight is stable. She notes that her diet has not been good since she returned as she has enjoyed eating scones. She does feel more fatigued on some days. No other localizing symptoms.    Her sister died of liver cancer two months ago and her nephew died of pancreatic cancer a few months ago.    Labs 08/05/22 showed a normal CMP and CBC  CT of the abdomen and pelvis with contrast 09/30/2022 showed no source for her symptoms.   Prior endoscopy history: Colonoscopy with Dr. Troy Sine 2000: diverticulosis. No hemorrhoids mentioned.  Colonoscopy with Dr. Troy Sine 12/28/00: Normal, repeat in 5 years given history of breast cancer Colonoscopy with Dr. Troy Sine 2014: small polyp, diverticulosis. No hemorrhoids mentioned.  Colonoscopy 08/14/19: internal hemorrhoids, pancolonic diverticulosis, melanosis coli, 4 small polyps - no polyps seen on pathology     Past Medical History:  Diagnosis Date   Allergy    Anxiety    Asthma    evalutated by pulmonary severe by spirometry no smoker currently   Breast cancer     hx of right lumpectomy, 1990 IIA radiation cmfp ajunct rx   Cataract    removed from both eyes   Chronic airway obstruction, not elsewhere classified    Coronary artery calcification seen on CAT scan 01/23/2018   Diverticulosis    only has like 2   Dysplasia    hx treated with cerv conization   Family history of breast cancer    Family history of pancreatic cancer    Fatty liver    US done 2010   Fibromyalgia    GERD (gastroesophageal reflux disease)    Hx of nonmelanoma skin cancer     followed dr [place]    Hyperlipidemia    Hypothyroidism    Malignant neoplasm of breast (female), unspecified site 11/28/2013   Osteoarthritis    Osteopenia    Osteoporosis    REM sleep behavior disorder    Skin cancer    squamous cell    Past Surgical History:  Procedure Laterality Date   BREAST LUMPECTOMY     on right, 1990 IIA radiation cmfp ajuction rx   COLONOSCOPY     KNEE SURGERY  2012   NASAL SINUS SURGERY  2007   on right done by Dr. Annalee Genta    Current Outpatient Medications  Medication Sig Dispense Refill   atorvastatin (LIPITOR) 20 MG tablet Take 1 tablet (20 mg total) by mouth daily. 90 tablet 0   Cholecalciferol (VITAMIN D3) 50 MCG (2000 UT) TABS Take by mouth daily.     diphenhydramine-acetaminophen (  TYLENOL PM) 25-500 MG TABS Take 1 tablet by mouth at bedtime as needed. sleep     fluticasone furoate-vilanterol (BREO ELLIPTA) 200-25 MCG/ACT AEPB Inhale 1 puff into the lungs daily.     PARoxetine (PAXIL) 40 MG tablet TAKE 1 TABLET AT BEDTIME 90 tablet 3   SYNTHROID 88 MCG tablet TAKE 1 TABLET EVERY DAY 90 tablet 3   vitamin B-12 (CYANOCOBALAMIN) 1000 MCG tablet Take 1,000 mcg by mouth daily.     vitamin C (ASCORBIC ACID) 500 MG tablet Take 500 mg by mouth daily.     acetaminophen (TYLENOL) 500 MG tablet Take 1,000 mg by mouth daily as needed. pain     valACYclovir (VALTREX) 1000 MG tablet TAKE 2 TABLETS BY MOUTH TWICE DAILY AS DIRECTED 90 tablet prn   Current  Facility-Administered Medications  Medication Dose Route Frequency Provider Last Rate Last Admin   0.9 %  sodium chloride infusion  500 mL Intravenous Continuous Tressia Danas, MD        Allergies as of 10/25/2022 - Review Complete 10/25/2022  Allergen Reaction Noted   Hydrocodone Itching 11/08/2010    Family History  Problem Relation Age of Onset   Breast cancer Mother 26       possible uterine cancer diagnosis at age 1   Osteoporosis Mother        no hip fracture   Other Father        mysthenia gravis   Lung cancer Sister    Breast cancer Sister 31   Lung cancer Sister 66       former smoker   Lung cancer Paternal Grandmother    Osteoporosis Other    Thyroid disease Other    Breast cancer Other        MGMs sister   Colon cancer Neg Hx    Esophageal cancer Neg Hx    Stomach cancer Neg Hx    Rectal cancer Neg Hx      Physical Exam: General:   Alert,  well-nourished, pleasant and cooperative in NAD Head:  Normocephalic and atraumatic. Eyes:  Sclera clear, no icterus.   Conjunctiva pink. Mouth:  No deformity or lesions.   Neck:  Supple; no masses or thyromegaly. Lungs:  Clear throughout to auscultation.   No wheezes. Heart:  Regular rate and rhythm; no murmurs. Abdomen:  Soft, non-tender, nondistended, normal bowel sounds, no rebound or guarding.  Msk:  Symmetrical. No boney deformities LAD: No inguinal or umbilical LAD Extremities:  No clubbing or edema. Neurologic:  Alert and  oriented x4;  grossly nonfocal Skin:  No obvious rash or bruise. Psych:  Alert and cooperative. Normal mood and affect.     Studies/Results: No results found.    Yaxiel Minnie L. Orvan Falconer, MD, MPH 10/25/2022, 8:20 AM

## 2022-10-26 ENCOUNTER — Telehealth: Payer: Self-pay | Admitting: *Deleted

## 2022-10-26 NOTE — Telephone Encounter (Signed)
Post procedure follow up call placed, no answer and left VM.  

## 2022-11-06 ENCOUNTER — Encounter: Payer: Self-pay | Admitting: Gastroenterology

## 2022-11-16 NOTE — Progress Notes (Unsigned)
No chief complaint on file.   HPI: Jasmine Neal 77 y.o. come in for  ROS: See pertinent positives and negatives per HPI.  Past Medical History:  Diagnosis Date   Allergy    Anxiety    Asthma    evalutated by pulmonary severe by spirometry no smoker currently   Breast cancer (HCC)    hx of right lumpectomy, 1990 IIA radiation cmfp ajunct rx   Cataract    removed from both eyes   Chronic airway obstruction, not elsewhere classified    Coronary artery calcification seen on CAT scan 01/23/2018   Diverticulosis    only has like 2   Dysplasia    hx treated with cerv conization   Family history of breast cancer    Family history of pancreatic cancer    Fatty liver    US done 2010   Fibromyalgia    GERD (gastroesophageal reflux disease)    Hx of nonmelanoma skin cancer     followed dr [place]    Hyperlipidemia    Hypothyroidism    Malignant neoplasm of breast (female), unspecified site 11/28/2013   Osteoarthritis    Osteopenia    Osteoporosis    REM sleep behavior disorder    Skin cancer    squamous cell    Family History  Problem Relation Age of Onset   Breast cancer Mother 76       possible uterine cancer diagnosis at age 69   Osteoporosis Mother        no hip fracture   Other Father        mysthenia gravis   Lung cancer Sister    Breast cancer Sister 34   Lung cancer Sister 25       former smoker   Lung cancer Paternal Grandmother    Osteoporosis Other    Thyroid disease Other    Breast cancer Other        MGMs sister   Colon cancer Neg Hx    Esophageal cancer Neg Hx    Stomach cancer Neg Hx    Rectal cancer Neg Hx     Social History   Socioeconomic History   Marital status: Widowed    Spouse name: Not on file   Number of children: 1   Years of education: Not on file   Highest education level: Not on file  Occupational History   Occupation: Retired Runner, broadcasting/film/video  Tobacco Use   Smoking status: Former    Packs/day: 1.00    Years: 10.00     Additional pack years: 0.00    Total pack years: 10.00    Types: Cigarettes    Start date: 1964    Quit date: 07/11/1974    Years since quitting: 48.3   Smokeless tobacco: Never  Vaping Use   Vaping Use: Never used  Substance and Sexual Activity   Alcohol use: Yes    Alcohol/week: 7.0 standard drinks of alcohol    Types: 7 Standard drinks or equivalent per week    Comment: 2 glasses of red wine nightly and socially   Drug use: No   Sexual activity: Yes    Partners: Male  Other Topics Concern   Not on file  Social History Narrative   Retired Runner, broadcasting/film/video    hh of 1   Now has puppy    Quit smoking 30 + years ago. 20 year pack year smoking hx   Regular exercise- some  treadmill    8 hours  sleep         Caffeine use: 1-2 cups of coffee/[employer] instant   Husband dx glioblastoma fall 15   Passed away fall 12/03/14   Social Determinants of Health   Financial Resource Strain: Low Risk  (09/29/2020)   Overall Financial Resource Strain (CARDIA)    Difficulty of Paying Living Expenses: Not hard at all  Food Insecurity: No Food Insecurity (09/29/2020)   Hunger Vital Sign    Worried About Running Out of Food in the Last Year: Never true    Ran Out of Food in the Last Year: Never true  Transportation Needs: No Transportation Needs (09/29/2020)   PRAPARE - Administrator, Civil Service (Medical): No    Lack of Transportation (Non-Medical): No  Physical Activity: Sufficiently Active (09/29/2020)   Exercise Vital Sign    Days of Exercise per Week: 5 days    Minutes of Exercise per Session: 30 min  Stress: No Stress Concern Present (09/29/2020)   [employer] of Occupational Health - Occupational Stress Questionnaire    Feeling of Stress : Not at all  Social Connections: Moderately Isolated (09/29/2020)   Social Connection and Isolation Panel [NHANES]    Frequency of Communication with Friends and Family: More than three times a week    Frequency of Social Gatherings with Friends  and Family: More than three times a week    Attends Religious Services: 1 to 4 times per year    Active Member of [employer] or Organizations: No    Attends Banker Meetings: Never    Marital Status: Widowed    Outpatient Medications Prior to Visit  Medication Sig Dispense Refill   acetaminophen (TYLENOL) 500 MG tablet Take 1,000 mg by mouth daily as needed. pain     atorvastatin (LIPITOR) 20 MG tablet Take 1 tablet (20 mg total) by mouth daily. 90 tablet 0   Cholecalciferol (VITAMIN D3) 50 MCG (2000 UT) TABS Take by mouth daily.     diphenhydramine-acetaminophen (TYLENOL PM) 25-500 MG TABS Take 1 tablet by mouth at bedtime as needed. sleep     fluticasone furoate-vilanterol (BREO ELLIPTA) 200-25 MCG/ACT AEPB Inhale 1 puff into the lungs daily.     PARoxetine (PAXIL) 40 MG tablet TAKE 1 TABLET AT BEDTIME 90 tablet 3   SYNTHROID 88 MCG tablet TAKE 1 TABLET EVERY DAY 90 tablet 3   valACYclovir (VALTREX) 1000 MG tablet TAKE 2 TABLETS BY MOUTH TWICE DAILY AS DIRECTED 90 tablet prn   vitamin B-12 (CYANOCOBALAMIN) 1000 MCG tablet Take 1,000 mcg by mouth daily.     vitamin C (ASCORBIC ACID) 500 MG tablet Take 500 mg by mouth daily.     No facility-administered medications prior to visit.     EXAM:  There were no vitals taken for this visit.  There is no height or weight on file to calculate BMI.  GENERAL: vitals reviewed and listed above, alert, oriented, appears well hydrated and in no acute distress HEENT: atraumatic, conjunctiva  clear, no obvious abnormalities on inspection of external nose and ears OP : no lesion edema or exudate  NECK: no obvious masses on inspection palpation  LUNGS: clear to auscultation bilaterally, no wheezes, rales or rhonchi, good air movement CV: HRRR, no clubbing cyanosis or  peripheral edema nl cap refill  MS: moves all extremities without noticeable focal  abnormality PSYCH: pleasant and cooperative, no obvious depression or anxiety Lab Results   Component Value Date   WBC 4.1 08/05/2022  HGB 13.9 08/05/2022   HCT 41.5 08/05/2022   PLT 172.0 08/05/2022   GLUCOSE 86 09/21/2022   CHOL 168 08/05/2022   TRIG 65.0 08/05/2022   HDL 68.00 08/05/2022   LDLDIRECT 137.0 09/11/2012   LDLCALC 87 08/05/2022   ALT 33 08/05/2022   AST 35 08/05/2022   NA 140 09/21/2022   K 4.2 09/21/2022   CL 103 09/21/2022   CREATININE 0.87 09/21/2022   BUN 16 09/21/2022   CO2 30 09/21/2022   TSH 0.56 08/05/2022   HGBA1C 5.6 07/14/2021   BP Readings from Last 3 Encounters:  10/25/22 (!) 170/62  09/21/22 136/78  08/12/22 (!) 142/84    ASSESSMENT AND PLAN:  Discussed the following assessment and plan:  No diagnosis found.  -Patient advised to return or notify health care team  if  new concerns arise.  There are no Patient Instructions on file for this visit.   Neta Mends. Tamla Winkels M.D.

## 2022-11-17 ENCOUNTER — Ambulatory Visit: Payer: Medicare PPO | Admitting: Internal Medicine

## 2022-11-17 ENCOUNTER — Encounter: Payer: Self-pay | Admitting: Internal Medicine

## 2022-11-17 VITALS — BP 147/80 | HR 71 | Temp 97.9°F | Ht 68.0 in | Wt 160.8 lb

## 2022-11-17 DIAGNOSIS — J449 Chronic obstructive pulmonary disease, unspecified: Secondary | ICD-10-CM | POA: Diagnosis not present

## 2022-11-17 DIAGNOSIS — Z7189 Other specified counseling: Secondary | ICD-10-CM

## 2022-11-17 DIAGNOSIS — M545 Low back pain, unspecified: Secondary | ICD-10-CM | POA: Diagnosis not present

## 2022-11-17 DIAGNOSIS — R6889 Other general symptoms and signs: Secondary | ICD-10-CM

## 2022-11-17 DIAGNOSIS — R350 Frequency of micturition: Secondary | ICD-10-CM | POA: Diagnosis not present

## 2022-11-17 DIAGNOSIS — R030 Elevated blood-pressure reading, without diagnosis of hypertension: Secondary | ICD-10-CM | POA: Diagnosis not present

## 2022-11-17 DIAGNOSIS — G8929 Other chronic pain: Secondary | ICD-10-CM

## 2022-11-17 NOTE — Patient Instructions (Addendum)
I agree seeing ortho about back  also have them check arm shoulder but seems like muscle related  and not a mass  may be other intervnetions to manage   Get  team  a copy of your  advanced directives  for the record.  Can give to desk for scan in document  Urology  check into and let us know if need a referral . Since myrbetric  didn't help may help with urodynamics   I can cover the inhaler for now  if worsening we will have  you get  back to Dr Marchelle Gearing team . Make sure  bp in control at home   Your liver imagine and last labs were normal  this  year .

## 2022-12-29 ENCOUNTER — Telehealth: Payer: Self-pay | Admitting: Internal Medicine

## 2022-12-29 DIAGNOSIS — G8929 Other chronic pain: Secondary | ICD-10-CM

## 2022-12-29 NOTE — Telephone Encounter (Signed)
Pt states that for 2 weeks now she has been calling:  Javier Docker, MD @ [facility] Phone: [phone]  Pt has left message after message with their answering service, to no avail.  Pt is asking for MD's assistance in getting her in to see this provider.  Pt stated MD is aware and told her to call if MD needed to expedite an appointment.  Please advise.

## 2023-01-02 DIAGNOSIS — M545 Low back pain, unspecified: Secondary | ICD-10-CM | POA: Diagnosis not present

## 2023-01-02 DIAGNOSIS — M25552 Pain in left hip: Secondary | ICD-10-CM | POA: Diagnosis not present

## 2023-01-02 DIAGNOSIS — M25551 Pain in right hip: Secondary | ICD-10-CM | POA: Diagnosis not present

## 2023-01-06 NOTE — Addendum Note (Signed)
Addended byVickii Chafe on: 01/06/2023 11:16 AM   Modules accepted: Orders

## 2023-01-18 DIAGNOSIS — Z6824 Body mass index (BMI) 24.0-24.9, adult: Secondary | ICD-10-CM | POA: Diagnosis not present

## 2023-01-18 DIAGNOSIS — Z779 Other contact with and (suspected) exposures hazardous to health: Secondary | ICD-10-CM | POA: Diagnosis not present

## 2023-01-18 DIAGNOSIS — Z124 Encounter for screening for malignant neoplasm of cervix: Secondary | ICD-10-CM | POA: Diagnosis not present

## 2023-01-18 DIAGNOSIS — Z1151 Encounter for screening for human papillomavirus (HPV): Secondary | ICD-10-CM | POA: Diagnosis not present

## 2023-01-18 DIAGNOSIS — C50919 Malignant neoplasm of unspecified site of unspecified female breast: Secondary | ICD-10-CM | POA: Diagnosis not present

## 2023-02-27 ENCOUNTER — Encounter: Payer: Self-pay | Admitting: Internal Medicine

## 2023-02-27 DIAGNOSIS — R350 Frequency of micturition: Secondary | ICD-10-CM

## 2023-02-27 NOTE — Telephone Encounter (Signed)
Please arrange referral to female urology  for symptoms  reported

## 2023-03-01 ENCOUNTER — Other Ambulatory Visit: Payer: Self-pay | Admitting: Internal Medicine

## 2023-03-22 DIAGNOSIS — L720 Epidermal cyst: Secondary | ICD-10-CM | POA: Diagnosis not present

## 2023-03-22 DIAGNOSIS — D22121 Melanocytic nevi of left upper eyelid, including canthus: Secondary | ICD-10-CM | POA: Diagnosis not present

## 2023-03-22 DIAGNOSIS — D2261 Melanocytic nevi of right upper limb, including shoulder: Secondary | ICD-10-CM | POA: Diagnosis not present

## 2023-03-22 DIAGNOSIS — L821 Other seborrheic keratosis: Secondary | ICD-10-CM | POA: Diagnosis not present

## 2023-03-22 DIAGNOSIS — D2262 Melanocytic nevi of left upper limb, including shoulder: Secondary | ICD-10-CM | POA: Diagnosis not present

## 2023-03-22 DIAGNOSIS — D2271 Melanocytic nevi of right lower limb, including hip: Secondary | ICD-10-CM | POA: Diagnosis not present

## 2023-04-12 DIAGNOSIS — R351 Nocturia: Secondary | ICD-10-CM | POA: Diagnosis not present

## 2023-04-12 DIAGNOSIS — R350 Frequency of micturition: Secondary | ICD-10-CM | POA: Diagnosis not present

## 2023-05-24 DIAGNOSIS — R351 Nocturia: Secondary | ICD-10-CM | POA: Diagnosis not present

## 2023-05-24 DIAGNOSIS — R350 Frequency of micturition: Secondary | ICD-10-CM | POA: Diagnosis not present

## 2023-06-16 DIAGNOSIS — M25552 Pain in left hip: Secondary | ICD-10-CM | POA: Diagnosis not present

## 2023-06-16 DIAGNOSIS — M25551 Pain in right hip: Secondary | ICD-10-CM | POA: Insufficient documentation

## 2023-06-19 DIAGNOSIS — K5900 Constipation, unspecified: Secondary | ICD-10-CM | POA: Diagnosis not present

## 2023-06-19 DIAGNOSIS — N3946 Mixed incontinence: Secondary | ICD-10-CM | POA: Diagnosis not present

## 2023-06-19 DIAGNOSIS — R351 Nocturia: Secondary | ICD-10-CM | POA: Diagnosis not present

## 2023-06-19 DIAGNOSIS — M6281 Muscle weakness (generalized): Secondary | ICD-10-CM | POA: Diagnosis not present

## 2023-06-29 ENCOUNTER — Encounter: Payer: Self-pay | Admitting: Family Medicine

## 2023-06-29 ENCOUNTER — Ambulatory Visit: Payer: Medicare PPO | Admitting: Family Medicine

## 2023-06-29 ENCOUNTER — Telehealth: Payer: Self-pay | Admitting: *Deleted

## 2023-06-29 VITALS — BP 130/78 | HR 83 | Temp 98.6°F | Wt 159.4 lb

## 2023-06-29 DIAGNOSIS — J209 Acute bronchitis, unspecified: Secondary | ICD-10-CM

## 2023-06-29 MED ORDER — AZITHROMYCIN 250 MG PO TABS: ORAL_TABLET | ORAL | 0 refills | Status: DC

## 2023-06-29 MED ORDER — BENZONATATE 200 MG PO CAPS: 200.0000 mg | ORAL_CAPSULE | Freq: Four times a day (QID) | ORAL | 0 refills | Status: DC | PRN

## 2023-06-29 MED ORDER — ALBUTEROL SULFATE HFA 108 (90 BASE) MCG/ACT IN AERS: 2.0000 | INHALATION_SPRAY | RESPIRATORY_TRACT | 0 refills | Status: AC | PRN

## 2023-06-29 NOTE — Telephone Encounter (Signed)
Copied from CRM [id number]. Topic: Clinical - Medical Advice >> Jun 29, 2023  8:04 AM Deaijah H wrote: Reason for CRM: Patient called in wanting to know if she needs to get tested for RSV could she come in or would she need to go somewhere else for that specific testing, would like to speak with Dr. Fabian Sharp regarding concern. // please call [phone]

## 2023-06-29 NOTE — Telephone Encounter (Signed)
Correction: contacted pt. Pt reports she was seen with Dr. Clent Ridges this morning. States no further questions.

## 2023-06-29 NOTE — Telephone Encounter (Signed)
Pt was seen today with Dr. Clent Ridges.

## 2023-06-29 NOTE — Progress Notes (Signed)
   Subjective:    Patient ID: Jasmine Neal, female    DOB: [birth year], 77 y.o.   MRN: [id number]  HPI Here for 2 weeks of chest tightness and coughing up green sputum. No fever or SOB.    Review of Systems  Constitutional: Negative.   HENT: Negative.    Eyes: Negative.   Respiratory:  Positive for cough, chest tightness and wheezing. Negative for shortness of breath.        Objective:   Physical Exam Constitutional:      Comments: Coughing frequently   HENT:     Right Ear: Tympanic membrane, ear canal and external ear normal.     Left Ear: Tympanic membrane, ear canal and external ear normal.     Nose: Nose normal.     Mouth/Throat:     Pharynx: Oropharynx is clear.  Eyes:     Conjunctiva/sclera: Conjunctivae normal.  Pulmonary:     Effort: Pulmonary effort is normal.     Breath sounds: Wheezing and rhonchi present. No rales.  Lymphadenopathy:     Cervical: No cervical adenopathy.  Neurological:     Mental Status: She is alert.           Assessment & Plan:  Bronchitis, treat wth a Zpack. Add Benzonatate and an albuterol inhaler as needed.  Gershon Crane, MD j

## 2023-07-09 DIAGNOSIS — M545 Low back pain, unspecified: Secondary | ICD-10-CM | POA: Diagnosis not present

## 2023-07-13 DIAGNOSIS — M25551 Pain in right hip: Secondary | ICD-10-CM | POA: Diagnosis not present

## 2023-07-13 DIAGNOSIS — M25552 Pain in left hip: Secondary | ICD-10-CM | POA: Diagnosis not present

## 2023-07-13 DIAGNOSIS — M545 Low back pain, unspecified: Secondary | ICD-10-CM | POA: Diagnosis not present

## 2023-07-17 DIAGNOSIS — R350 Frequency of micturition: Secondary | ICD-10-CM | POA: Diagnosis not present

## 2023-07-17 DIAGNOSIS — N3946 Mixed incontinence: Secondary | ICD-10-CM | POA: Diagnosis not present

## 2023-07-17 DIAGNOSIS — M6281 Muscle weakness (generalized): Secondary | ICD-10-CM | POA: Diagnosis not present

## 2023-07-17 DIAGNOSIS — R351 Nocturia: Secondary | ICD-10-CM | POA: Diagnosis not present

## 2023-07-20 DIAGNOSIS — M5416 Radiculopathy, lumbar region: Secondary | ICD-10-CM | POA: Insufficient documentation

## 2023-07-25 DIAGNOSIS — M5416 Radiculopathy, lumbar region: Secondary | ICD-10-CM | POA: Diagnosis not present

## 2023-07-26 ENCOUNTER — Telehealth: Payer: Self-pay | Admitting: Internal Medicine

## 2023-07-26 NOTE — Telephone Encounter (Signed)
 Copied from CRM [id number]. Topic: General - Other >> Jul 26, 2023  8:06 AM Lovett Ruck C wrote: Reason for CRM: patient was unsure of specifications for her labs- and I wanted to double check for her. She thought it was water and black coffee before labs. If there is anything else please contact patient. Thank you Called patient to advise that there is no eating or drinking 8 hours prior to appointment but water and black coffee are allowed.

## 2023-08-01 DIAGNOSIS — H5310 Unspecified subjective visual disturbances: Secondary | ICD-10-CM | POA: Diagnosis not present

## 2023-08-01 DIAGNOSIS — G43909 Migraine, unspecified, not intractable, without status migrainosus: Secondary | ICD-10-CM | POA: Diagnosis not present

## 2023-08-04 ENCOUNTER — Telehealth: Payer: Self-pay | Admitting: Internal Medicine

## 2023-08-04 ENCOUNTER — Other Ambulatory Visit: Payer: Medicare PPO

## 2023-08-04 NOTE — Telephone Encounter (Signed)
Received a message from [place], Lab stating "she said she always does lab a week before her annul visit w/ the dr. Marland Kitchen  Lab orders are not placed as provider did not received the message that pt is requesting a lab order prior to cpe appt.   Provider is not in office. Spoke to co-worker, Enrique Sack, for direction to take to address this.   She advise pt can sign ABN form if pt would like to get lab today. To ask Candice about the form.   After asking Candice about the form, went to explain to pt.   Explain to pt that providers don't order lab prior to their cpe appt except Dr. Fabian Sharp if she is aware from pt that they are requesting for one.   Continues that Dr. Fabian Sharp did not received the message that pt is requesting lab order therefore the order did not get sent.   Gave pt the options  to sign ABN form if she would like to get today or r/s appt to another day with lab order placed.   Pt states she is not able to r/s to next week bc she is leaving out of town.   Apologize to pt for any inconvenience.   Pt cancel her lab appt. Keeping her appt on 08/15/2023 for cpe.   Forwarding to provider for FYI.

## 2023-08-04 NOTE — Telephone Encounter (Signed)
Pt is requesting for lab orders to be placed so she can do her labs ahead of her physical. Please advise.

## 2023-08-08 ENCOUNTER — Other Ambulatory Visit: Payer: Medicare PPO

## 2023-08-09 NOTE — Telephone Encounter (Signed)
This message came in when I was out of office

## 2023-08-11 ENCOUNTER — Telehealth: Payer: Self-pay | Admitting: Internal Medicine

## 2023-08-11 NOTE — Telephone Encounter (Unsigned)
Copied from CRM [id number]. Topic: Appointments - Appointment Scheduling >> Aug 11, 2023 12:19 PM [place] A wrote: Patient would like to schedule labs but order is not in the [facility]

## 2023-08-14 ENCOUNTER — Other Ambulatory Visit (INDEPENDENT_AMBULATORY_CARE_PROVIDER_SITE_OTHER): Payer: Medicare PPO

## 2023-08-14 ENCOUNTER — Other Ambulatory Visit: Payer: Self-pay | Admitting: Internal Medicine

## 2023-08-14 DIAGNOSIS — E038 Other specified hypothyroidism: Secondary | ICD-10-CM | POA: Diagnosis not present

## 2023-08-14 DIAGNOSIS — E785 Hyperlipidemia, unspecified: Secondary | ICD-10-CM

## 2023-08-14 DIAGNOSIS — Z79899 Other long term (current) drug therapy: Secondary | ICD-10-CM | POA: Diagnosis not present

## 2023-08-14 DIAGNOSIS — Z0000 Encounter for general adult medical examination without abnormal findings: Secondary | ICD-10-CM

## 2023-08-14 LAB — CBC WITH DIFFERENTIAL/PLATELET
Basophils Absolute: 0 10*3/uL (ref 0.0–0.1)
Basophils Relative: 0.9 % (ref 0.0–3.0)
Eosinophils Absolute: 0.2 10*3/uL (ref 0.0–0.7)
Eosinophils Relative: 4.4 % (ref 0.0–5.0)
HCT: 40.5 % (ref 36.0–46.0)
Hemoglobin: 13.2 g/dL (ref 12.0–15.0)
Lymphocytes Relative: 30.7 % (ref 12.0–46.0)
Lymphs Abs: 1.5 10*3/uL (ref 0.7–4.0)
MCHC: 32.7 g/dL (ref 30.0–36.0)
MCV: 91.9 fL (ref 78.0–100.0)
Monocytes Absolute: 0.5 10*3/uL (ref 0.1–1.0)
Monocytes Relative: 9.2 % (ref 3.0–12.0)
Neutro Abs: 2.7 10*3/uL (ref 1.4–7.7)
Neutrophils Relative %: 54.8 % (ref 43.0–77.0)
Platelets: 223 10*3/uL (ref 150.0–400.0)
RBC: 4.4 Mil/uL (ref 3.87–5.11)
RDW: 13.9 % (ref 11.5–15.5)
WBC: 5 10*3/uL (ref 4.0–10.5)

## 2023-08-14 LAB — LIPID PANEL
Cholesterol: 161 mg/dL (ref 0–200)
HDL: 60.6 mg/dL (ref >39.00)
LDL Cholesterol: 85 mg/dL (ref 0–99)
NonHDL: 100.56
Total CHOL/HDL Ratio: 3
Triglycerides: 77 mg/dL (ref 0.0–149.0)
VLDL: 15.4 mg/dL (ref 0.0–40.0)

## 2023-08-14 LAB — VITAMIN B12: Vitamin B-12: 586 pg/mL (ref 211–911)

## 2023-08-14 LAB — TSH: TSH: 0.95 u[IU]/mL (ref 0.35–5.50)

## 2023-08-15 ENCOUNTER — Encounter: Payer: Medicare PPO | Admitting: Internal Medicine

## 2023-08-15 ENCOUNTER — Ambulatory Visit (INDEPENDENT_AMBULATORY_CARE_PROVIDER_SITE_OTHER): Payer: Medicare PPO

## 2023-08-15 DIAGNOSIS — E785 Hyperlipidemia, unspecified: Secondary | ICD-10-CM | POA: Diagnosis not present

## 2023-08-15 DIAGNOSIS — E038 Other specified hypothyroidism: Secondary | ICD-10-CM

## 2023-08-15 DIAGNOSIS — Z79899 Other long term (current) drug therapy: Secondary | ICD-10-CM

## 2023-08-15 LAB — BASIC METABOLIC PANEL
BUN: 20 mg/dL (ref 6–23)
CO2: 27 meq/L (ref 19–32)
Calcium: 9.9 mg/dL (ref 8.4–10.5)
Chloride: 103 meq/L (ref 96–112)
Creatinine, Ser: 0.86 mg/dL (ref 0.40–1.20)
GFR: 64.57 mL/min (ref >60.00)
Glucose, Bld: 80 mg/dL (ref 70–99)
Potassium: 3.9 meq/L (ref 3.5–5.1)
Sodium: 140 meq/L (ref 135–145)

## 2023-08-15 LAB — HEPATIC FUNCTION PANEL
ALT: 14 U/L (ref 0–35)
AST: 18 U/L (ref 0–37)
Albumin: 4 g/dL (ref 3.5–5.2)
Alkaline Phosphatase: 104 U/L (ref 39–117)
Bilirubin, Direct: 0 mg/dL (ref 0.0–0.3)
Total Bilirubin: 0.6 mg/dL (ref 0.2–1.2)
Total Protein: 7 g/dL (ref 6.0–8.3)

## 2023-08-15 NOTE — Addendum Note (Signed)
Addended by: Vickii Chafe on: 08/15/2023 10:03 AM   Modules accepted: Orders

## 2023-08-16 NOTE — Telephone Encounter (Signed)
 Orders were future. Pt came in yesterday to get rest of blood work. No further action is needed.

## 2023-08-17 ENCOUNTER — Encounter: Payer: Self-pay | Admitting: Internal Medicine

## 2023-08-23 DIAGNOSIS — N3946 Mixed incontinence: Secondary | ICD-10-CM | POA: Diagnosis not present

## 2023-08-23 DIAGNOSIS — R350 Frequency of micturition: Secondary | ICD-10-CM | POA: Diagnosis not present

## 2023-08-23 DIAGNOSIS — M6281 Muscle weakness (generalized): Secondary | ICD-10-CM | POA: Diagnosis not present

## 2023-08-25 ENCOUNTER — Other Ambulatory Visit: Payer: Self-pay | Admitting: Internal Medicine

## 2023-08-25 DIAGNOSIS — Z0000 Encounter for general adult medical examination without abnormal findings: Secondary | ICD-10-CM

## 2023-09-04 ENCOUNTER — Telehealth: Payer: Self-pay

## 2023-09-04 NOTE — Telephone Encounter (Signed)
 Copied from CRM [id number]. Topic: Clinical - Medical Advice >> Sep 04, 2023  2:09 PM Herbert Seta B wrote: Reason for CRM: Patient calling to speak with PCP/Nurse for medical advice; patient wants to come in for annual check up has concerns now about possible RSV symptoms, doesn't know if should make separate appointment? [phone]

## 2023-09-06 DIAGNOSIS — N3946 Mixed incontinence: Secondary | ICD-10-CM | POA: Diagnosis not present

## 2023-09-06 DIAGNOSIS — M6281 Muscle weakness (generalized): Secondary | ICD-10-CM | POA: Diagnosis not present

## 2023-09-06 DIAGNOSIS — R350 Frequency of micturition: Secondary | ICD-10-CM | POA: Diagnosis not present
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
## 2023-09-20 ENCOUNTER — Encounter: Payer: Self-pay | Admitting: Internal Medicine

## 2023-09-20 ENCOUNTER — Ambulatory Visit (INDEPENDENT_AMBULATORY_CARE_PROVIDER_SITE_OTHER): Payer: Medicare PPO | Admitting: Internal Medicine

## 2023-09-20 VITALS — BP 160/90 | HR 74 | Temp 98.1°F | Ht 68.0 in | Wt 159.8 lb

## 2023-09-20 DIAGNOSIS — J449 Chronic obstructive pulmonary disease, unspecified: Secondary | ICD-10-CM | POA: Diagnosis not present

## 2023-09-20 DIAGNOSIS — E039 Hypothyroidism, unspecified: Secondary | ICD-10-CM

## 2023-09-20 DIAGNOSIS — F411 Generalized anxiety disorder: Secondary | ICD-10-CM

## 2023-09-20 DIAGNOSIS — Z79899 Other long term (current) drug therapy: Secondary | ICD-10-CM | POA: Diagnosis not present

## 2023-09-20 DIAGNOSIS — Z0000 Encounter for general adult medical examination without abnormal findings: Secondary | ICD-10-CM | POA: Diagnosis not present

## 2023-09-20 DIAGNOSIS — I10 Essential (primary) hypertension: Secondary | ICD-10-CM | POA: Diagnosis not present

## 2023-09-20 DIAGNOSIS — Z853 Personal history of malignant neoplasm of breast: Secondary | ICD-10-CM | POA: Diagnosis not present

## 2023-09-20 MED ORDER — SYNTHROID 88 MCG PO TABS: 88.0000 ug | ORAL_TABLET | Freq: Every day | ORAL | 3 refills | Status: AC

## 2023-09-20 MED ORDER — VALSARTAN 160 MG PO TABS: 160.0000 mg | ORAL_TABLET | Freq: Every day | ORAL | 1 refills | Status: DC

## 2023-09-20 MED ORDER — PAROXETINE HCL 40 MG PO TABS: 40.0000 mg | ORAL_TABLET | Freq: Every day | ORAL | 3 refills | Status: AC

## 2023-09-20 NOTE — Patient Instructions (Signed)
 Good to see you today .  Refilled thyroid and paxil. Begin  new bp medication If  goes too low  can take 1/2 of dose .   Continue monitoring and ROV in 3 months   If we continue on same med would recheck  chemistry   to check  potassium level etc.  Would prefer at this time to fu with Dr Marchelle Gearing because of reasons stated   I will take a look at  record  to see if I have any other advice  ? If a tripple inhaler would be optimal.

## 2023-09-27 DIAGNOSIS — R350 Frequency of micturition: Secondary | ICD-10-CM | POA: Diagnosis not present

## 2023-09-27 DIAGNOSIS — R351 Nocturia: Secondary | ICD-10-CM | POA: Diagnosis not present

## 2023-09-27 DIAGNOSIS — N3946 Mixed incontinence: Secondary | ICD-10-CM | POA: Diagnosis not present

## 2023-10-09 ENCOUNTER — Ambulatory Visit
Admission: RE | Admit: 2023-10-09 | Discharge: 2023-10-09 | Disposition: A | Discharge status: Home / Self Care | Payer: Medicare PPO | Source: Ambulatory Visit | Attending: Internal Medicine | Admitting: Internal Medicine

## 2023-10-09 DIAGNOSIS — Z1231 Encounter for screening mammogram for malignant neoplasm of breast: Secondary | ICD-10-CM | POA: Diagnosis not present

## 2023-10-09 DIAGNOSIS — Z0000 Encounter for general adult medical examination without abnormal findings: Secondary | ICD-10-CM

## 2023-10-18 ENCOUNTER — Other Ambulatory Visit (INDEPENDENT_AMBULATORY_CARE_PROVIDER_SITE_OTHER)

## 2023-10-18 DIAGNOSIS — Z79899 Other long term (current) drug therapy: Secondary | ICD-10-CM

## 2023-10-18 DIAGNOSIS — I10 Essential (primary) hypertension: Secondary | ICD-10-CM

## 2023-10-18 LAB — BASIC METABOLIC PANEL WITH GFR
BUN: 25 mg/dL — ABNORMAL HIGH (ref 6–23)
CO2: 30 meq/L (ref 19–32)
Calcium: 9.8 mg/dL (ref 8.4–10.5)
Chloride: 103 meq/L (ref 96–112)
Creatinine, Ser: 0.89 mg/dL (ref 0.40–1.20)
GFR: 61.89 mL/min (ref >60.00)
Glucose, Bld: 93 mg/dL (ref 70–99)
Potassium: 4.3 meq/L (ref 3.5–5.1)
Sodium: 139 meq/L (ref 135–145)

## 2023-10-20 ENCOUNTER — Encounter: Payer: Self-pay | Admitting: Internal Medicine

## 2023-10-20 NOTE — Progress Notes (Signed)
 Chemistry stable    no changes  needed

## 2023-12-27 DIAGNOSIS — R351 Nocturia: Secondary | ICD-10-CM | POA: Diagnosis not present

## 2023-12-27 DIAGNOSIS — R350 Frequency of micturition: Secondary | ICD-10-CM | POA: Diagnosis not present

## 2023-12-27 DIAGNOSIS — N3946 Mixed incontinence: Secondary | ICD-10-CM | POA: Diagnosis not present

## 2024-01-02 ENCOUNTER — Encounter: Payer: Self-pay | Admitting: Internal Medicine

## 2024-01-02 ENCOUNTER — Ambulatory Visit: Admitting: Internal Medicine

## 2024-01-02 VITALS — BP 162/100 | HR 71 | Temp 98.2°F | Ht 68.0 in | Wt 161.6 lb

## 2024-01-02 DIAGNOSIS — Z79899 Other long term (current) drug therapy: Secondary | ICD-10-CM | POA: Diagnosis not present

## 2024-01-02 DIAGNOSIS — T887XXA Unspecified adverse effect of drug or medicament, initial encounter: Secondary | ICD-10-CM

## 2024-01-02 DIAGNOSIS — R202 Paresthesia of skin: Secondary | ICD-10-CM | POA: Diagnosis not present

## 2024-01-02 DIAGNOSIS — Z853 Personal history of malignant neoplasm of breast: Secondary | ICD-10-CM

## 2024-01-02 DIAGNOSIS — E039 Hypothyroidism, unspecified: Secondary | ICD-10-CM

## 2024-01-02 DIAGNOSIS — I10 Essential (primary) hypertension: Secondary | ICD-10-CM

## 2024-01-02 DIAGNOSIS — J449 Chronic obstructive pulmonary disease, unspecified: Secondary | ICD-10-CM | POA: Diagnosis not present

## 2024-01-02 MED ORDER — AMLODIPINE BESYLATE 2.5 MG PO TABS: 2.5000 mg | ORAL_TABLET | Freq: Every day | ORAL | 2 refills | Status: DC
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
## 2024-01-02 NOTE — Progress Notes (Signed)
 Chief Complaint  Patient presents with   Medical Management of Chronic Issues    Pt reports she quit taking valsartan  a wk now. It was making her feels tired.     HPI: Jasmine Neal 78 y.o. come in for Chronic disease management   See last note fu 3 mos BP /HT  rcxed valsartan   at last visit  stopped cause she felt  caused se  extreme fatigue unrealted to bp readings   has log of  readings .  Was on for about 3 + weeks and off now about a week  feeling better  Pulm  stable Cont thyroid  paxil  Electric  feeling that lasts about 2 minutes and is there every day  feeling left shoulder to neck and  down arm . Upper left checst  ROS: See pertinent positives and negatives per HPI.  Past Medical History:  Diagnosis Date   Allergy    Anxiety    Asthma    evalutated by pulmonary severe by spirometry no smoker currently   Breast cancer (HCC)    hx of right lumpectomy, 1990 IIA radiation cmfp ajunct rx   Cataract    removed from both eyes   Chronic airway obstruction, not elsewhere classified    Coronary artery calcification seen on CAT scan 01/23/2018   Diverticulosis    only has like 2   Dysplasia    hx treated with cerv conization   Family history of breast cancer    Family history of pancreatic cancer    Fatty liver    us  done 2010   Fibromyalgia    GERD (gastroesophageal reflux disease)    Hx of nonmelanoma skin cancer     followed dr [place]    Hyperlipidemia    Hypothyroidism    Malignant neoplasm of breast (female), unspecified site 11/28/2013   Osteoarthritis    Osteopenia    Osteoporosis    REM sleep behavior disorder    Skin cancer    squamous cell    Family History  Problem Relation Age of Onset   Breast cancer Mother 64       possible uterine cancer diagnosis at age 62   Osteoporosis Mother        no hip fracture   Other Father        mysthenia gravis   Lung cancer Sister    Breast cancer Sister 37   Lung cancer Sister 11       former smoker    Lung cancer Paternal Grandmother    Osteoporosis Other    Thyroid  disease Other    Breast cancer Other        MGMs sister   Colon cancer Neg Hx    Esophageal cancer Neg Hx    Stomach cancer Neg Hx    Rectal cancer Neg Hx     Social History   Socioeconomic History   Marital status: Widowed    Spouse name: Not on file   Number of children: 1   Years of education: Not on file   Highest education level: Not on file  Occupational History   Occupation: Retired Runner, broadcasting/film/video  Tobacco Use   Smoking status: Former    Current packs/day: 0.00    Average packs/day: 1 pack/day for 12.0 years (12.0 ttl pk-yrs)    Types: Cigarettes    Start date: 1964    Quit date: 07/11/1974    Years since quitting: 49.5   Smokeless tobacco: Never  Vaping Use  Vaping status: Never Used  Substance and Sexual Activity   Alcohol use: Yes    Alcohol/week: 7.0 standard drinks of alcohol    Types: 7 Standard drinks or equivalent per week    Comment: 2 glasses of red wine nightly and socially   Drug use: No   Sexual activity: Yes    Partners: Male  Other Topics Concern   Not on file  Social History Narrative   Retired Runner, broadcasting/film/video    hh of 1   Now has puppy    Quit smoking 30 + years ago. 20 year pack year smoking hx   Regular exercise- some  treadmill    8 hours sleep         Caffeine use: 1-2 cups of coffee/[employer] instant   Husband dx glioblastoma fall 15   Passed away fall 01-19-2015   Social Drivers of Health   Financial Resource Strain: Low Risk  (09/29/2020)   Overall Financial Resource Strain (CARDIA)    Difficulty of Paying Living Expenses: Not hard at all  Food Insecurity: No Food Insecurity (09/29/2020)   Hunger Vital Sign    Worried About Running Out of Food in the Last Year: Never true    Ran Out of Food in the Last Year: Never true  Transportation Needs: No Transportation Needs (09/29/2020)   PRAPARE - Administrator, Civil Service (Medical): No    Lack of Transportation  (Non-Medical): No  Physical Activity: Sufficiently Active (09/29/2020)   Exercise Vital Sign    Days of Exercise per Week: 5 days    Minutes of Exercise per Session: 30 min  Stress: No Stress Concern Present (09/29/2020)   [employer] of Occupational Health - Occupational Stress Questionnaire    Feeling of Stress : Not at all  Social Connections: Moderately Isolated (09/29/2020)   Social Connection and Isolation Panel    Frequency of Communication with Friends and Family: More than three times a week    Frequency of Social Gatherings with Friends and Family: More than three times a week    Attends Religious Services: 1 to 4 times per year    Active Member of [employer] or Organizations: No    Attends Banker Meetings: Never    Marital Status: Widowed    Outpatient Medications Prior to Visit  Medication Sig Dispense Refill   acetaminophen  (TYLENOL ) 500 MG tablet Take 1,000 mg by mouth daily as needed. pain     atorvastatin  (LIPITOR) 20 MG tablet TAKE 1 TABLET EVERY DAY 90 tablet 3   Cholecalciferol (VITAMIN D3) 50 MCG (2000 UT) TABS Take by mouth daily.     diphenhydramine-acetaminophen  (TYLENOL  PM) 25-500 MG TABS Take 1 tablet by mouth at bedtime as needed. sleep     fluticasone  furoate-vilanterol (BREO ELLIPTA ) 200-25 MCG/ACT AEPB Inhale 1 puff into the lungs daily.     GEMTESA 75 MG TABS Take 1 tablet by mouth daily.     meloxicam (MOBIC) 15 MG tablet Take one tablet daily with food (Patient taking differently: daily as needed.)     PARoxetine  (PAXIL ) 40 MG tablet Take 1 tablet (40 mg total) by mouth at bedtime. 90 tablet 3   SYNTHROID  88 MCG tablet Take 1 tablet (88 mcg total) by mouth daily. 90 tablet 3   valACYclovir  (VALTREX ) 1000 MG tablet TAKE 2 TABLETS BY MOUTH TWICE DAILY AS DIRECTED (Patient taking differently: as needed. TAKE 2 TABLETS BY MOUTH TWICE DAILY AS DIRECTED) 90 tablet prn   vitamin B-12 (  CYANOCOBALAMIN ) 1000 MCG tablet Take 1,000 mcg by mouth daily.      vitamin C (ASCORBIC ACID) 500 MG tablet Take 500 mg by mouth daily.     albuterol  (VENTOLIN  HFA) 108 (90 Base) MCG/ACT inhaler Inhale 2 puffs into the lungs every 4 (four) hours as needed for wheezing or shortness of breath. (Patient not taking: Reported on 01/02/2024) 18 g 0   valsartan  (DIOVAN ) 160 MG tablet Take 1 tablet (160 mg total) by mouth daily. For blood pressure (Patient not taking: Reported on 01/02/2024) 90 tablet 1   No facility-administered medications prior to visit.     EXAM:  BP (!) 162/100 (BP Location: Left Arm, Patient Position: Sitting, Cuff Size: Normal)   Pulse 71   Temp 98.2 F (36.8 C) (Oral)   Ht 5' 8 (1.727 m)   Wt 161 lb 9.6 oz (73.3 kg)   SpO2 97%   BMI 24.57 kg/m   Body mass index is 24.57 kg/m. BP Readings from Last 3 Encounters:  01/02/24 (!) 162/100  09/20/23 (!) 160/90  06/29/23 130/78   Bp  right large 150/78  156/80 reg  sitting  by provider  no paradoxes noted GENERAL: vitals reviewed and listed above, alert, oriented, appears well hydrated and in no acute distress HEENT: atraumatic, conjunctiva  clear, no obvious abnormalities on inspection of external nose and ears  NECK: no obvious masses on inspection palpation  MS: moves all extremities without noticeable focal  abnormality no ob weakness or atropy PSYCH: pleasant and cooperative, no obvious depression or anxiety Lab Results  Component Value Date   WBC 5.0 08/14/2023   HGB 13.2 08/14/2023   HCT 40.5 08/14/2023   PLT 223.0 08/14/2023   GLUCOSE 93 10/18/2023   CHOL 161 08/14/2023   TRIG 77.0 08/14/2023   HDL 60.60 08/14/2023   LDLDIRECT 137.0 09/11/2012   LDLCALC 85 08/14/2023   ALT 14 08/15/2023   AST 18 08/15/2023   NA 139 10/18/2023   K 4.3 10/18/2023   CL 103 10/18/2023   CREATININE 0.89 10/18/2023   BUN 25 (H) 10/18/2023   CO2 30 10/18/2023   TSH 0.95 08/14/2023   HGBA1C 5.6 07/14/2021   BP Readings from Last 3 Encounters:  01/02/24 (!) 162/100  09/20/23 (!)  160/90  06/29/23 130/78    ASSESSMENT AND PLAN:  Discussed the following assessment and plan:  Paresthesia - intermittent electric feeling left shoulder neck arm without other assoc sx - Plan: Ambulatory referral to Neurology  Medication management  Primary hypertension  Medication side effect - preseumed valsartan   fatigue low energy not related to BP readings  Hypothyroidism, unspecified type  COPD, moderate (HCC) - stable   on inhaers  encouraged to be follow pulmonary managment  History of breast cancer - Plan: Ambulatory referral to Neurology Bp readings  r  in 150 158 range  diastolic 80 range  sitting  and monitor correlates enough  Se of valsartan   not correlating with bp readings ( ie low bp )  but fatigue was related to timing and better off med.  Record review: i  will add low dose amlodipine as tolerated 2.5 per day  continue to monitor and fu in 1-2 months No need for work up for secondary ht at this time  Uncertain cause of the electrical  episodes short lived left shoulder arm neck area  that may be relieved by position but nor clearly .    No obv masses neck . Neuro consult  contact if  worsening   -Patient advised to return or notify health care team  if  new concerns arise.  Patient Instructions  Ok to stay off of the valsartan   Begin low dose amlodipine .daily. Plan fu  in about 1-2 months  or as needed.  Continue bp monitoring  Avoid dehydration.   Neuro consult about the electrical  sx in left arm . In interim    Jonna Dittrich K. Vergia Chea M.D.

## 2024-01-18 ENCOUNTER — Encounter: Payer: Self-pay | Admitting: Neurology

## 2024-02-08 DIAGNOSIS — M5416 Radiculopathy, lumbar region: Secondary | ICD-10-CM | POA: Diagnosis not present

## 2024-02-13 ENCOUNTER — Ambulatory Visit: Admitting: Internal Medicine

## 2024-02-13 ENCOUNTER — Encounter: Payer: Self-pay | Admitting: Internal Medicine

## 2024-02-13 VITALS — BP 138/70 | HR 74 | Temp 97.7°F | Ht 68.0 in | Wt 159.2 lb

## 2024-02-13 DIAGNOSIS — I10 Essential (primary) hypertension: Secondary | ICD-10-CM | POA: Diagnosis not present

## 2024-02-13 DIAGNOSIS — Z79899 Other long term (current) drug therapy: Secondary | ICD-10-CM | POA: Diagnosis not present

## 2024-02-13 DIAGNOSIS — R202 Paresthesia of skin: Secondary | ICD-10-CM

## 2024-02-13 NOTE — Patient Instructions (Addendum)
 Continue  BP medication continuing .  For another   month or so   Goal is below 140/90 average  Optimum 130/80. Average  Your machine is good enough to make  medical decision.

## 2024-02-13 NOTE — Progress Notes (Addendum)
 Chief Complaint  Patient presents with   Medical Management of Chronic Issues    Pt is f/u on BP.    HPI: Jasmine Neal 78 y.o. come in for Chronic disease management   Fu of elevated BP  with WC effect .  Began amlodipine  a t last visit Low dose  no se of med   Readings in office 118/122/130/ tdday    Has log  some at goal others  150 range   diastolic  ROS: See pertinent positives and negatives per HPI.  Past Medical History:  Diagnosis Date   Allergy    Anxiety    Asthma    evalutated by pulmonary severe by spirometry no smoker currently   Breast cancer (HCC)    hx of right lumpectomy, 1990 IIA radiation cmfp ajunct rx   Cataract    removed from both eyes   Chronic airway obstruction, not elsewhere classified    Coronary artery calcification seen on CAT scan 01/23/2018   Diverticulosis    only has like 2   Dysplasia    hx treated with cerv conization   Family history of breast cancer    Family history of pancreatic cancer    Fatty liver    us  done 2010   Fibromyalgia    GERD (gastroesophageal reflux disease)    Hx of nonmelanoma skin cancer     followed dr [place]    Hyperlipidemia    Hypothyroidism    Malignant neoplasm of breast (female), unspecified site 11/28/2013   Osteoarthritis    Osteopenia    Osteoporosis    REM sleep behavior disorder    Skin cancer    squamous cell    Family History  Problem Relation Age of Onset   Breast cancer Mother 25       possible uterine cancer diagnosis at age 89   Osteoporosis Mother        no hip fracture   Other Father        mysthenia gravis   Lung cancer Sister    Breast cancer Sister 57   Lung cancer Sister 41       former smoker   Lung cancer Paternal Grandmother    Osteoporosis Other    Thyroid  disease Other    Breast cancer Other        MGMs sister   Colon cancer Neg Hx    Esophageal cancer Neg Hx    Stomach cancer Neg Hx    Rectal cancer Neg Hx     Social History   Socioeconomic  History   Marital status: Widowed    Spouse name: Not on file   Number of children: 1   Years of education: Not on file   Highest education level: Not on file  Occupational History   Occupation: Retired Runner, broadcasting/film/video  Tobacco Use   Smoking status: Former    Current packs/day: 0.00    Average packs/day: 1 pack/day for 12.0 years (12.0 ttl pk-yrs)    Types: Cigarettes    Start date: 1964    Quit date: 07/11/1974    Years since quitting: 49.6   Smokeless tobacco: Never  Vaping Use   Vaping status: Never Used  Substance and Sexual Activity   Alcohol use: Yes    Alcohol/week: 7.0 standard drinks of alcohol    Types: 7 Standard drinks or equivalent per week    Comment: 2 glasses of red wine nightly and socially   Drug use: No  Sexual activity: Yes    Partners: Male  Other Topics Concern   Not on file  Social History Narrative   Retired Runner, broadcasting/film/video    hh of 1   Now has puppy    Quit smoking 30 + years ago. 20 year pack year smoking hx   Regular exercise- some  treadmill    8 hours sleep         Caffeine use: 1-2 cups of coffee/[employer] instant   Husband dx glioblastoma fall 15   Passed away fall Mar 07, 2015   Social Drivers of Health   Financial Resource Strain: Low Risk  (09/29/2020)   Overall Financial Resource Strain (CARDIA)    Difficulty of Paying Living Expenses: Not hard at all  Food Insecurity: No Food Insecurity (09/29/2020)   Hunger Vital Sign    Worried About Running Out of Food in the Last Year: Never true    Ran Out of Food in the Last Year: Never true  Transportation Needs: No Transportation Needs (09/29/2020)   PRAPARE - Administrator, Civil Service (Medical): No    Lack of Transportation (Non-Medical): No  Physical Activity: Sufficiently Active (09/29/2020)   Exercise Vital Sign    Days of Exercise per Week: 5 days    Minutes of Exercise per Session: 30 min  Stress: No Stress Concern Present (09/29/2020)   [employer] of Occupational Health - Occupational  Stress Questionnaire    Feeling of Stress : Not at all  Social Connections: Moderately Isolated (09/29/2020)   Social Connection and Isolation Panel    Frequency of Communication with Friends and Family: More than three times a week    Frequency of Social Gatherings with Friends and Family: More than three times a week    Attends Religious Services: 1 to 4 times per year    Active Member of [employer] or Organizations: No    Attends Banker Meetings: Never    Marital Status: Widowed    Outpatient Medications Prior to Visit  Medication Sig Dispense Refill   acetaminophen  (TYLENOL ) 500 MG tablet Take 1,000 mg by mouth daily as needed. pain     albuterol  (VENTOLIN  HFA) 108 (90 Base) MCG/ACT inhaler Inhale 2 puffs into the lungs every 4 (four) hours as needed for wheezing or shortness of breath. 18 g 0   amLODipine  (NORVASC ) 2.5 MG tablet Take 1 tablet (2.5 mg total) by mouth daily. 30 tablet 2   atorvastatin  (LIPITOR) 20 MG tablet TAKE 1 TABLET EVERY DAY 90 tablet 3   Cholecalciferol (VITAMIN D3) 50 MCG (2000 UT) TABS Take by mouth daily.     diphenhydramine-acetaminophen  (TYLENOL  PM) 25-500 MG TABS Take 1 tablet by mouth at bedtime as needed. sleep     fluticasone  furoate-vilanterol (BREO ELLIPTA ) 200-25 MCG/ACT AEPB Inhale 1 puff into the lungs daily.     GEMTESA 75 MG TABS Take 1 tablet by mouth daily.     meloxicam (MOBIC) 15 MG tablet Take one tablet daily with food (Patient taking differently: daily as needed.)     PARoxetine  (PAXIL ) 40 MG tablet Take 1 tablet (40 mg total) by mouth at bedtime. 90 tablet 3   SYNTHROID  88 MCG tablet Take 1 tablet (88 mcg total) by mouth daily. 90 tablet 3   valACYclovir  (VALTREX ) 1000 MG tablet TAKE 2 TABLETS BY MOUTH TWICE DAILY AS DIRECTED (Patient taking differently: as needed. TAKE 2 TABLETS BY MOUTH TWICE DAILY AS DIRECTED) 90 tablet prn   vitamin B-12 (CYANOCOBALAMIN ) 1000 MCG  tablet Take 1,000 mcg by mouth daily.     vitamin C (ASCORBIC  ACID) 500 MG tablet Take 500 mg by mouth daily.     valsartan  (DIOVAN ) 160 MG tablet Take 1 tablet (160 mg total) by mouth daily. For blood pressure (Patient not taking: Reported on 02/13/2024) 90 tablet 1   No facility-administered medications prior to visit.     EXAM:  BP 138/70 (BP Location: Left Arm, Patient Position: Sitting, Cuff Size: Normal)   Pulse 74   Temp 97.7 F (36.5 C) (Oral)   Ht 5' 8 (1.727 m)   Wt 159 lb 3.2 oz (72.2 kg)   SpO2 93%   BMI 24.21 kg/m   Body mass index is 24.21 kg/m. Bp home monitor 121/66 , 130/60 large136/68 reg  GENERAL: vitals reviewed and listed above, alert, oriented, appears well hydrated and in no acute distress HEENT: atraumatic, conjunctiva  clear, no obvious abnormalities on inspection of external nose and ears OP : no lesion edema or exudate  NECK: no obvious masses on inspection palpation  PSYCH: pleasant and cooperative, no obvious depression or anxiety Lab Results  Component Value Date   WBC 5.0 08/14/2023   HGB 13.2 08/14/2023   HCT 40.5 08/14/2023   PLT 223.0 08/14/2023   GLUCOSE 93 10/18/2023   CHOL 161 08/14/2023   TRIG 77.0 08/14/2023   HDL 60.60 08/14/2023   LDLDIRECT 137.0 09/11/2012   LDLCALC 85 08/14/2023   ALT 14 08/15/2023   AST 18 08/15/2023   NA 139 10/18/2023   K 4.3 10/18/2023   CL 103 10/18/2023   CREATININE 0.89 10/18/2023   BUN 25 (H) 10/18/2023   CO2 30 10/18/2023   TSH 0.95 08/14/2023   HGBA1C 5.6 07/14/2021   BP Readings from Last 3 Encounters:  02/13/24 138/70  01/02/24 (!) 162/100  09/20/23 (!) 160/90    ASSESSMENT AND PLAN:  Discussed the following assessment and plan:  Primary hypertension  Paresthesia  Medication management Improved readings  although somewhat labil  Shared Decision Making  continue 2.5 for now  and fu in another 1-2 months  can be virtual visit  Depending on ht control  we can inc dose if needed.  Keep neuro appt    not sure why has sx  she has but  not  progressive  at this time   -Patient advised to return or notify health care team  if  new concerns arise.  Patient Instructions  Continue  BP medication continuing .  For another   month or so   Goal is below 140/90 average  Optimum 130/80. Average  Your machine is good enough to make  medical decision.        Jasmine Neal M.D.

## 2024-02-19 ENCOUNTER — Ambulatory Visit: Admitting: Neurology

## 2024-02-19 ENCOUNTER — Encounter: Payer: Self-pay | Admitting: Neurology

## 2024-02-19 VITALS — BP 116/64 | HR 69 | Ht 68.0 in | Wt 162.0 lb

## 2024-02-19 DIAGNOSIS — R202 Paresthesia of skin: Secondary | ICD-10-CM

## 2024-02-19 NOTE — Progress Notes (Signed)
 [facility] Clinic Note - Initial Visit   Date: 02/19/2024   Jasmine Neal MRN: [id number] DOB: 08-01-1945   Dear Dr. Charlett:  Thank you for your kind referral of Jasmine Neal for consultation of left arm and shoulder pain. Although her history is well known to you, please allow us  to reiterate it for the purpose of our medical record. The patient was accompanied to the clinic by self.   [name] is a 78 y.o. right-handed female with hypertension, hyperlipidemia, hypothyroidism, history of right breast cancer s/p lumpectomy, chemotherapy, and radiation (1990), and anxiety presenting for evaluation of left neck/shoulder pain.   IMPRESSION/PLAN: Left upper extremity paresthesias following a fall is most likely due to cervical nerve stretching/compression.  Fortunately, symptoms have completely resolved. Neurological exam is normal.   If symptoms returns, we can order neck physiotherapy and consider MRI if no improvement.    ------------------------------------------------------------- History of present illness: She had a fall landing on her left side about 6 months and a few days later, she developed electrical sensation over the left arm, back, and chest.  She recalls that the pain would occur when she was mostly sitting and the backrest would apply pressure to the left side of her neck/shoulder region.  Pain was very brief and would resolve with repositioning.    She denied associated weakness, numbness/tingling of the hands.  Her pain resolved about a month ago and has not recurred.   She lives alone.  Nonsmoker.  She drinks wine with dinner nightly 1-2 glasses nightly.  She previously worked as a Microbiologist.     Out-side paper records, electronic medical record, and images have been reviewed where available and summarized as:  Lab Results  Component Value Date   HGBA1C 5.6 07/14/2021   Lab Results  Component Value  Date   VITAMINB12 586 08/14/2023   Lab Results  Component Value Date   TSH 0.95 08/14/2023   No results found for: ESRSEDRATE, POCTSEDRATE  Past Medical History:  Diagnosis Date   Allergy    Anxiety    Asthma    evalutated by pulmonary severe by spirometry no smoker currently   Breast cancer (HCC)    hx of right lumpectomy, 1990 IIA radiation cmfp ajunct rx   Cataract    removed from both eyes   Chronic airway obstruction, not elsewhere classified    Coronary artery calcification seen on CAT scan 01/23/2018   Diverticulosis    only has like 2   Dysplasia    hx treated with cerv conization   Family history of breast cancer    Family history of pancreatic cancer    Fatty liver    us  done 2010   Fibromyalgia    GERD (gastroesophageal reflux disease)    Hx of nonmelanoma skin cancer     followed dr [place]    Hyperlipidemia    Hypothyroidism    Malignant neoplasm of breast (female), unspecified site 11/28/2013   Osteoarthritis    Osteopenia    Osteoporosis    REM sleep behavior disorder    Skin cancer    squamous cell    Past Surgical History:  Procedure Laterality Date   BREAST LUMPECTOMY     on right, 1990 IIA radiation cmfp ajuction rx   COLONOSCOPY     KNEE SURGERY  2012   NASAL SINUS SURGERY  2007   on right done by Dr. Mable     Medications:  Outpatient  Encounter Medications as of 02/19/2024  Medication Sig   acetaminophen  (TYLENOL ) 500 MG tablet Take 1,000 mg by mouth daily as needed. pain   albuterol  (VENTOLIN  HFA) 108 (90 Base) MCG/ACT inhaler Inhale 2 puffs into the lungs every 4 (four) hours as needed for wheezing or shortness of breath.   amLODipine  (NORVASC ) 2.5 MG tablet Take 1 tablet (2.5 mg total) by mouth daily.   atorvastatin  (LIPITOR) 20 MG tablet TAKE 1 TABLET EVERY DAY   Cholecalciferol (VITAMIN D3) 50 MCG (2000 UT) TABS Take by mouth daily.   diphenhydramine-acetaminophen  (TYLENOL  PM) 25-500 MG TABS Take 1 tablet by mouth at  bedtime as needed. sleep   fluticasone  furoate-vilanterol (BREO ELLIPTA ) 200-25 MCG/ACT AEPB Inhale 1 puff into the lungs daily.   GEMTESA 75 MG TABS Take 1 tablet by mouth daily.   meloxicam (MOBIC) 15 MG tablet Take one tablet daily with food   PARoxetine  (PAXIL ) 40 MG tablet Take 1 tablet (40 mg total) by mouth at bedtime.   SYNTHROID  88 MCG tablet Take 1 tablet (88 mcg total) by mouth daily.   valACYclovir  (VALTREX ) 1000 MG tablet TAKE 2 TABLETS BY MOUTH TWICE DAILY AS DIRECTED   valsartan  (DIOVAN ) 160 MG tablet Take 1 tablet (160 mg total) by mouth daily. For blood pressure   vitamin B-12 (CYANOCOBALAMIN ) 1000 MCG tablet Take 1,000 mcg by mouth daily.   vitamin C (ASCORBIC ACID) 500 MG tablet Take 500 mg by mouth daily.   No facility-administered encounter medications on file as of 02/19/2024.    Allergies:  Allergies  Allergen Reactions   Hydrocodone  Itching    Family History: Family History  Problem Relation Age of Onset   Breast cancer Mother 77       possible uterine cancer diagnosis at age 71   Osteoporosis Mother        no hip fracture   Other Father        mysthenia gravis   Lung cancer Sister    Breast cancer Sister 28   Lung cancer Sister 75       former smoker   Lung cancer Paternal Grandmother    Osteoporosis Other    Thyroid  disease Other    Breast cancer Other        MGMs sister   Colon cancer Neg Hx    Esophageal cancer Neg Hx    Stomach cancer Neg Hx    Rectal cancer Neg Hx     Social History: Social History   Tobacco Use   Smoking status: Former    Current packs/day: 0.00    Average packs/day: 1 pack/day for 12.0 years (12.0 ttl pk-yrs)    Types: Cigarettes    Start date: 1964    Quit date: 07/11/1974    Years since quitting: 49.6   Smokeless tobacco: Never  Vaping Use   Vaping status: Never Used  Substance Use Topics   Alcohol use: Yes    Alcohol/week: 7.0 standard drinks of alcohol    Types: 7 Standard drinks or equivalent per week     Comment: 2 glasses of red wine nightly and socially   Drug use: No   Social History   Social History Narrative   Retired Runner, broadcasting/film/video    hh of 1   Now has puppy    Quit smoking 30 + years ago. 20 year pack year smoking hx   Regular exercise- some  treadmill    8 hours sleep         Caffeine  use: 1-2 cups of coffee/[employer] instant   Husband dx glioblastoma fall 15   Passed away fall 03/21/15         Are you right handed or left handed? Right Handed    Are you currently employed ? No    What is your current occupation?   Do you live at home alone? Yes    Who lives with you?    What type of home do you live in: 1 story or 2 story? Two story home        Vital Signs:  BP 116/64   Pulse 69   Ht 5' 8 (1.727 m)   Wt 162 lb (73.5 kg)   SpO2 100%   BMI 24.63 kg/m   Neurological Exam: MENTAL STATUS including orientation to time, place, person, recent and remote memory, attention span and concentration, language, and fund of knowledge is normal.  Speech is not dysarthric.  CRANIAL NERVES: II:  No visual field defects.     III-IV-VI: Pupils equal round and reactive to light.  Normal conjugate, extra-ocular eye movements in all directions of gaze.  No nystagmus.  No ptosis.   V:  Normal facial sensation.    VII:  Normal facial symmetry and movements.   VIII:  Normal hearing and vestibular function.   IX-X:  Normal palatal movement.   XI:  Normal shoulder shrug and head rotation.   XII:  Normal tongue strength and range of motion, no deviation or fasciculation.  MOTOR:  No atrophy, fasciculations or abnormal movements.  No pronator drift.   Upper Extremity:  Right  Left  Deltoid  5/5   5/5   Biceps  5/5   5/5   Triceps  5/5   5/5   Infraspinatus 5/5  5/5  Medial pectoralis 5/5  5/5  Wrist extensors  5/5   5/5   Wrist flexors  5/5   5/5   Finger extensors  5/5   5/5   Finger flexors  5/5   5/5   Dorsal interossei  5/5   5/5   Abductor pollicis  5/5   5/5   Tone (Ashworth scale)   0  0   Lower Extremity:  Right  Left  Hip flexors  5/5   5/5   Knee flexors  5/5   5/5   Knee extensors  5/5   5/5   Dorsiflexors  5/5   5/5   Plantarflexors  5/5   5/5   Toe extensors  5/5   5/5   Toe flexors  5/5   5/5   Tone (Ashworth scale)  0  0   MSRs:                                           Right        Left brachioradialis 2+  2+  biceps 2+  2+  triceps 2+  2+  patellar 2+  2+  ankle jerk 2+  2+  plantar response down  down   SENSORY:  Reduced vibration at the great toe bilaterally (age-appropriate).  Otherwise, normal and symmetric perception of light touch, pinprick, and temperature.  Romberg's sign absent.   COORDINATION/GAIT: Normal finger-to- nose-finger.  Intact rapid alternating movements bilaterally.   Gait narrow based and stable. Tandem and stressed gait intact.     Thank you for allowing me to participate in  patient's care.  If I can answer any additional questions, I would be pleased to do so.    Sincerely,    Michalina Calbert K. Tobie, DO

## 2024-03-25 ENCOUNTER — Other Ambulatory Visit: Payer: Self-pay | Admitting: Internal Medicine

## 2024-04-02 ENCOUNTER — Other Ambulatory Visit: Payer: Self-pay | Admitting: Internal Medicine

## 2024-04-16 ENCOUNTER — Encounter: Payer: Self-pay | Admitting: Internal Medicine

## 2024-04-16 ENCOUNTER — Telehealth: Admitting: Internal Medicine

## 2024-04-16 VITALS — BP 165/88

## 2024-04-16 DIAGNOSIS — Z79899 Other long term (current) drug therapy: Secondary | ICD-10-CM

## 2024-04-16 DIAGNOSIS — I10 Essential (primary) hypertension: Secondary | ICD-10-CM

## 2024-04-16 MED ORDER — AMLODIPINE BESYLATE 5 MG PO TABS: 5.0000 mg | ORAL_TABLET | Freq: Every day | ORAL | 3 refills | Status: AC

## 2024-04-16 NOTE — Progress Notes (Signed)
 Virtual Visit via Video Note  I connected with Jasmine Neal on 04/16/24 at  8:30 AM EDT by a video enabled telemedicine application and verified that I am speaking with the correct person using two identifiers. Location patient: home Location provider:work office Persons participating in the virtual visit: patient, provider   Patient aware  of the limitations of evaluation and management by telemedicine and  availability of in person appointments. and agreed to proceed.   HPI: Jasmine Neal presents for video visit  fu HT and bp readings  Tolerating amlodipine  2.5  readings  163/298 readings below 140 163/298 140 - 149, 76/298 150 and above   Confirm couldn't take vlasartan cause of significant fatigue    No other change in health  sx of concern   ROS: See pertinent positives and negatives per HPI.  Past Medical History:  Diagnosis Date   Allergy    Anxiety    Asthma    evalutated by pulmonary severe by spirometry no smoker currently   Breast cancer (HCC)    hx of right lumpectomy, 1990 IIA radiation cmfp ajunct rx   Cataract    removed from both eyes   Chronic airway obstruction, not elsewhere classified    Coronary artery calcification seen on CAT scan 01/23/2018   Diverticulosis    only has like 2   Dysplasia    hx treated with cerv conization   Family history of breast cancer    Family history of pancreatic cancer    Fatty liver    us  done 2010   Fibromyalgia    GERD (gastroesophageal reflux disease)    Hx of nonmelanoma skin cancer     followed dr [place]    Hyperlipidemia    Hypothyroidism    Malignant neoplasm of breast (female), unspecified site 11/28/2013   Osteoarthritis    Osteopenia    Osteoporosis    REM sleep behavior disorder    Skin cancer    squamous cell    Past Surgical History:  Procedure Laterality Date   BREAST LUMPECTOMY     on right, 1990 IIA radiation cmfp ajuction rx   COLONOSCOPY     KNEE SURGERY  2012   NASAL  SINUS SURGERY  2007   on right done by Dr. Mable    Family History  Problem Relation Age of Onset   Breast cancer Mother 20       possible uterine cancer diagnosis at age 74   Osteoporosis Mother        no hip fracture   Other Father        mysthenia gravis   Lung cancer Sister    Breast cancer Sister 27   Lung cancer Sister 39       former smoker   Lung cancer Paternal Grandmother    Osteoporosis Other    Thyroid  disease Other    Breast cancer Other        MGMs sister   Colon cancer Neg Hx    Esophageal cancer Neg Hx    Stomach cancer Neg Hx    Rectal cancer Neg Hx     Social History   Tobacco Use   Smoking status: Former    Current packs/day: 0.00    Average packs/day: 1 pack/day for 12.0 years (12.0 ttl pk-yrs)    Types: Cigarettes    Start date: 60    Quit date: 07/11/1974    Years since quitting: 49.8   Smokeless tobacco: Never  Vaping  Use   Vaping status: Never Used  Substance Use Topics   Alcohol use: Yes    Alcohol/week: 7.0 standard drinks of alcohol    Types: 7 Standard drinks or equivalent per week    Comment: 2 glasses of red wine nightly and socially   Drug use: No      Current Outpatient Medications:    acetaminophen  (TYLENOL ) 500 MG tablet, Take 1,000 mg by mouth daily as needed. pain, Disp: , Rfl:    albuterol  (VENTOLIN  HFA) 108 (90 Base) MCG/ACT inhaler, Inhale 2 puffs into the lungs every 4 (four) hours as needed for wheezing or shortness of breath., Disp: 18 g, Rfl: 0   amLODipine  (NORVASC ) 5 MG tablet, Take 1 tablet (5 mg total) by mouth daily., Disp: 90 tablet, Rfl: 3   atorvastatin  (LIPITOR) 20 MG tablet, TAKE 1 TABLET EVERY DAY, Disp: 90 tablet, Rfl: 3   Cholecalciferol (VITAMIN D3) 50 MCG (2000 UT) TABS, Take by mouth daily., Disp: , Rfl:    diphenhydramine-acetaminophen  (TYLENOL  PM) 25-500 MG TABS, Take 1 tablet by mouth at bedtime as needed. sleep, Disp: , Rfl:    fluticasone  furoate-vilanterol (BREO ELLIPTA ) 200-25 MCG/ACT AEPB,  Inhale 1 puff into the lungs daily., Disp: , Rfl:    GEMTESA 75 MG TABS, Take 1 tablet by mouth daily., Disp: , Rfl:    meloxicam (MOBIC) 15 MG tablet, Take one tablet daily with food, Disp: , Rfl:    PARoxetine  (PAXIL ) 40 MG tablet, Take 1 tablet (40 mg total) by mouth at bedtime., Disp: 90 tablet, Rfl: 3   SYNTHROID  88 MCG tablet, Take 1 tablet (88 mcg total) by mouth daily., Disp: 90 tablet, Rfl: 3   valACYclovir  (VALTREX ) 1000 MG tablet, TAKE 2 TABLETS BY MOUTH TWICE DAILY AS DIRECTED, Disp: 90 tablet, Rfl: prn   vitamin B-12 (CYANOCOBALAMIN ) 1000 MCG tablet, Take 1,000 mcg by mouth daily., Disp: , Rfl:    vitamin C (ASCORBIC ACID) 500 MG tablet, Take 500 mg by mouth daily., Disp: , Rfl:   EXAM: BP Readings from Last 3 Encounters:  04/16/24 (!) 165/88  02/19/24 116/64  02/13/24 138/70    VITALS per patient if applicable:  GENERAL: alert, oriented, appears well and in no acute distress  HEENT: atraumatic, conjunttiva clear, no obvious abnormalities on inspection of external nose and ears  NECK: normal movements of the head and neck  LUNGS: on inspection no signs of respiratory distress, breathing rate appears normal, no obvious gross SOB, gasping or wheezing  CV: no obvious cyanosis  MS: moves all visible extremities without noticeable abnormality  PSYCH/NEURO: pleasant and cooperative, no obvious depression or anxiety, speech and thought processing grossly intact Lab Results  Component Value Date   WBC 5.0 08/14/2023   HGB 13.2 08/14/2023   HCT 40.5 08/14/2023   PLT 223.0 08/14/2023   GLUCOSE 93 10/18/2023   CHOL 161 08/14/2023   TRIG 77.0 08/14/2023   HDL 60.60 08/14/2023   LDLDIRECT 137.0 09/11/2012   LDLCALC 85 08/14/2023   ALT 14 08/15/2023   AST 18 08/15/2023   NA 139 10/18/2023   K 4.3 10/18/2023   CL 103 10/18/2023   CREATININE 0.89 10/18/2023   BUN 25 (H) 10/18/2023   CO2 30 10/18/2023   TSH 0.95 08/14/2023   HGBA1C 5.6 07/14/2021    ASSESSMENT AND  PLAN:  Discussed the following assessment and plan:    ICD-10-CM   1. Primary hypertension  I10     2. Medication management  [phone]  Tolerating  inc amlodipine  to 5 per day Send in message in 2-3 mos about readings  to decide on HT control and any next steps needed  Documenting of se of the valsartan  in past  Ok to get the covid booster  and is utd  on others review   Counseled.   Expectant management and discussion of plan and treatment with opportunity to ask questions and all were answered. The patient agreed with the plan and demonstrated an understanding of the instructions.   Advised to call back or seek an in-person evaluation if worsening  or having  further concerns  in interim. Return for message about readings  after 2-3 mos on amlod 5 .    Apolinar Eastern, MD

## 2024-05-07 ENCOUNTER — Encounter: Payer: Self-pay | Admitting: Internal Medicine

## 2024-06-18 ENCOUNTER — Ambulatory Visit: Admitting: Internal Medicine

## 2024-07-05 ENCOUNTER — Ambulatory Visit: Admitting: Family Medicine

## 2024-07-05 ENCOUNTER — Ambulatory Visit (HOSPITAL_BASED_OUTPATIENT_CLINIC_OR_DEPARTMENT_OTHER)
Admission: RE | Admit: 2024-07-05 | Discharge: 2024-07-05 | Disposition: A | Discharge status: Home / Self Care | Source: Ambulatory Visit | Attending: Family Medicine | Admitting: Family Medicine

## 2024-07-05 ENCOUNTER — Ambulatory Visit: Payer: Self-pay

## 2024-07-05 ENCOUNTER — Encounter: Payer: Self-pay | Admitting: Family Medicine

## 2024-07-05 VITALS — BP 124/60 | HR 80 | Temp 98.1°F | Resp 16 | Ht 68.0 in | Wt 164.2 lb

## 2024-07-05 DIAGNOSIS — R051 Acute cough: Secondary | ICD-10-CM | POA: Insufficient documentation

## 2024-07-05 LAB — POC COVID19 BINAXNOW: SARS Coronavirus 2 Ag: NEGATIVE

## 2024-07-05 LAB — POCT INFLUENZA A/B
Influenza A, POC: NEGATIVE
Influenza B, POC: NEGATIVE

## 2024-07-05 NOTE — Telephone Encounter (Signed)
 Appointment made with with Dr Comer Greet at [facility] due to no openings at PCP office   FYI Only or Action Required?: FYI only for provider: appointment scheduled on 07/05/2024 at 10am with Dr Comer Greet at [facility] due to no openings at PCP office.  Patient was last seen in primary care on 04/16/2024 by Panosh, Apolinar POUR, MD.  Called Nurse Triage reporting Cough.  Symptoms began several days ago.  Interventions attempted: OTC medications: Sudafed and Rest, hydration, or home remedies.  Symptoms are: gradually worsening.  Triage Disposition: See Physician Within 24 Hours  Patient/caregiver understands and will follow disposition?: Yes                Copied from CRM #[id number]. Topic: Clinical - Red Word Triage >> Jul 05, 2024  8:05 AM Adelita E wrote: [facility] that prompted transfer to Nurse Triage: Potential bronchitis, worsening cough. Patient stated it can be productive when she tries to get phlegm out. Reason for Disposition  [1] Continuous (nonstop) coughing interferes with work or school AND [2] no improvement using cough treatment per Care Advice  Answer Assessment - Initial Assessment Questions Runny nose constantly--clear Cough--clear when productive Patient thinks she might have bronchitis--feels similar to when she had bronchitis a year ago Patient states she has mild asthma and bronchiectasis Started several days ago with a sore throat, which has resolved Patient denies chest pain, difficulty breathing, nausea, vomiting   Patient is advised to call us  back if anything changes or with any further questions/concerns. Patient is advised that if anything worsens to go to the Emergency Room. Patient verbalized understanding.  Protocols used: Cough - Acute Productive-A-AH

## 2024-07-05 NOTE — Progress Notes (Signed)
" ° °  Subjective:    Patient ID: Jasmine Neal, female    DOB: 27-Jan-1946, 78 y.o.   MRN: [id number]  HPI Cough- pt has hx of COPD.  Pt reports sxs started 2 days ago w/ 'lots of coughing, chest discomfort due to cough, sore throat when swallowing.  No fever.  No HA.  No body aches.  Denies sinus pain/pressure.  No ear pain.  No known sick contacts.   Review of Systems For ROS see HPI     Objective:   Physical Exam Vitals reviewed.  Constitutional:      General: She is not in acute distress.    Appearance: Normal appearance. She is not ill-appearing.  HENT:     Head: Normocephalic and atraumatic.     Right Ear: Tympanic membrane and ear canal normal.     Left Ear: Tympanic membrane and ear canal normal.     Nose: No congestion or rhinorrhea.     Comments: No TTP over frontal or maxillary sinuses    Mouth/Throat:     Mouth: Mucous membranes are moist.     Pharynx: No oropharyngeal exudate or posterior oropharyngeal erythema.  Eyes:     Extraocular Movements: Extraocular movements intact.     Conjunctiva/sclera: Conjunctivae normal.  Cardiovascular:     Rate and Rhythm: Normal rate and regular rhythm.  Pulmonary:     Effort: Pulmonary effort is normal. No respiratory distress.     Breath sounds: No wheezing or rhonchi.     Comments: No cough heard during visit Musculoskeletal:     Cervical back: Neck supple.  Lymphadenopathy:     Cervical: No cervical adenopathy.  Skin:    General: Skin is warm and dry.  Neurological:     General: No focal deficit present.     Mental Status: She is alert and oriented to person, place, and time.  Psychiatric:        Mood and Affect: Mood normal.        Behavior: Behavior normal.        Thought Content: Thought content normal.           Assessment & Plan:  Cough- new.  Pt is negative for COVID and flu.  Given her hx of COPD will get CXR to assess.  No cough heard during rooming or visit.  Will hold on abx until CXR results  available.  Encouraged her to use her inhalers as directed and OTC Mucinex DM prn.  Pt expressed understanding and is in agreement w/ plan.   "

## 2024-07-05 NOTE — Patient Instructions (Signed)
 Follow up as needed or as scheduled GO to [facility] and get your chest xray Thankfully your flu and COVID tests were negative Once we have the report, we can decide if antibiotics are needed In the meantime, please USE your Albuterol  inhaler for cough, wheezing, shortness of breath ADD Mucinex DM over the counter for cough and congestion Drink plenty of fluids REST! Call with any questions or concerns Hang in there!

## 2024-07-08 ENCOUNTER — Telehealth: Payer: Self-pay

## 2024-07-08 NOTE — Telephone Encounter (Signed)
 Called radiology and they will read xray now

## 2024-07-08 NOTE — Telephone Encounter (Signed)
 Noted

## 2024-07-08 NOTE — Telephone Encounter (Signed)
 Results are not yet available.  Can we please call radiology and ask for them to read it?  Thank you!

## 2024-07-08 NOTE — Telephone Encounter (Signed)
 Patient is wondering what xray results are from Friday. Doesn't look like they are back yet  Copied from CRM #[id number]. Topic: Clinical - Lab/Test Results >> Jul 08, 2024 10:57 AM Charolett L wrote: Reason for CRM: Patient called in waiting to know the results of the test taken on Friday. CB# [phone]

## 2024-07-09 ENCOUNTER — Ambulatory Visit: Payer: Self-pay | Admitting: Family Medicine

## 2024-07-09 NOTE — Telephone Encounter (Signed)
 Patient is questioning what you believe she may have since ruling out flu and covid

## 2024-07-22 ENCOUNTER — Ambulatory Visit: Admitting: Internal Medicine

## 2024-07-22 ENCOUNTER — Encounter: Payer: Self-pay | Admitting: Internal Medicine

## 2024-07-22 VITALS — BP 124/70 | HR 78 | Ht 68.0 in | Wt 163.8 lb

## 2024-07-22 DIAGNOSIS — J4550 Severe persistent asthma, uncomplicated: Secondary | ICD-10-CM

## 2024-07-22 DIAGNOSIS — J479 Bronchiectasis, uncomplicated: Secondary | ICD-10-CM | POA: Diagnosis not present

## 2024-07-22 DIAGNOSIS — Z87891 Personal history of nicotine dependence: Secondary | ICD-10-CM | POA: Diagnosis not present

## 2024-07-22 MED ORDER — BREZTRI AEROSPHERE 160-9-4.8 MCG/ACT IN AERO: 2.0000 | INHALATION_SPRAY | Freq: Two times a day (BID) | RESPIRATORY_TRACT | Status: AC

## 2024-07-22 NOTE — Progress Notes (Signed)
 "     PCP Panosh, Apolinar POUR, MD   HPI  IOV 08/18/2017  Chief Complaint  Patient presents with   pulmonary consult    SOB with activity wheezing with exercise and a cough in the morning.    79 year old female whose husband died seen in 09-Aug-2013 for a lung nodule and he subsequently died from glioblastoma multiform A.  She is here as a new consult.  Her issues that she has had over 10 years of shortness of breath with exertion relieved by rest.  Insidious onset.  Slowly getting worse.  She also has cough with early morning wheezing.  The cough is dry but occasionally she has congestion.  Symptoms are indeed progressive.  Several years ago she was on Symbicort  or Advair.  She does not remember if this improved her.  Then a few years ago she got changed to [facility] along with Asmanex .  6 months ago the Asmanex  was stopped.  Overall these changes have not helped her.  She has learned to live with this. Vacuuming can  cause problems symptoms .  There is no associated chest pain.  She does not have hypertension or diabetes.  She is worried about the risk of lung cancer.  There is because a paternal grandfather and sister died from lung cancer.  Lab review shows that she has eosinophilia that is mild at 300 cells per cubic millimeter.  She has had previous allergy testing that was positive done by Dr. Kozlow.  Last CT scan of the chest was in 08-09-04.  In May 2018 she had spirometry at the outside.  I personally visualized the trace.  FEV1 0.9 L / 38% FVC 46% with an obstructed ratio.  Exam nitric oxide  today is elevated at 58 ppb. IN terms of smoking history, she smoke 1 ppd x 10 years but quit when she was 27.,> reports allrgy test 10 years ago as positive only for dust mites   OV 09/22/2017  Chief Complaint  Patient presents with   Follow-up    PFT done, no asthma flare at this time    Jasmine Neal returns for follow-up.  She tells me that after starting Brio her symptoms are significantly improved.  In  fact asthma control questionnaire shows a symptom score of 0.5.  She is now waking up at night because of asthma symptoms.  When she wakes up she has no symptoms.  She feels very slightly limited in her activities only when walking uphill.  And she is actually has a very little shortness of breath but again only while walking uphill or climbing stairs.  She has never wheezed and not use albuterol  for rescue at all.  Overall is a huge improvement.   Walking desaturation test on 09/22/2017 185 feet x 3 laps on ROOM AIR:  did no desaturate. Rest pulse ox was 100%, final pulse ox was 97%. HR response 69/min at rest to 93/min at peak exertion. Patient Jasmine Neal  Did not Desaturate < 88% . Jasmine Neal yes  Desaturated </= 3% points. Jasmine Neal yes did get tachyardic.  But she did drop pulse ox by 3 points.  In talking to her she tells me that she is got this fixed exertional dyspnea that is relieved by rest.  This may be slowly progressive.  There is no cough or wheezing.  She had pulmonary function test today and this shows significant severe obstruction and on bronchodilator responsiveness flow volume loop abnormalities.  The DLCO was also reduced at 60%.  She is very surprised that her lung function abnormalities also severe because she does not feel the symptoms as much and she under perceives the symptoms relative to the lung function abnormality.  She is very keen on getting a CT scan of the chest because of family history of lung cancer.   OV 02/16/2018  Chief Complaint  Patient presents with   Follow-up    5 month follow up to discuss test results.    Jasmine Neal eturns for follow-up of 2 issues  Moderate persistent asthma with fixed exertional dyspnea: She is tells me that with Brio her symptoms of improvement. Given the fixed exertional dyspnea is better. Overall asthma control questionnaire shows a score of 0.4 showing excellent control. She is not waking up in the middle of the  night with symptoms when she wakes up she has no symptoms. She is very slightly limited in her activities because of asthma and she's experienced very little shortness of breath. She does not have any wheeze or does not use albuterol  for rescue  Follow-up multiple lung nodules: the largest in RUL nodule and this stable march 2019  -> July 2019    IMPRESSION: CT chest Near complete re solution of ill-defined nodular opacity in medial left lower lobe, consistent with resolving inflammatory or infectious etiology.   Multiple other sub-cm bilateral pulmonary nodules remain stable, largest in right lung apex measuring 9 mm. Recommend continued follow-up by chest CT without contrast in 12 months. This recommendation follows the consensus statement: Guidelines for Management of Small Pulmonary Nodules Detected on CT Images: From the Fleischner Society 2017; Radiology 2017; 284:228-243.     Electronically Signed   By: Norleen Kil M.D.   On: 01/23/2018 15:19   OV 03/26/2019  Subjective:  Patient ID: Jasmine Neal, female , DOB: 07-19-1945 , age 11 y.o. , MRN: [id number] , ADDRESS: [address] [place] [address]   03/26/2019 -   Chief Complaint  Patient presents with   Moderate persistent asthma without complication    Discuss results of PFT      ICD-10-CM   1. Moderate persistent asthma without complication  J45.40 Tiotropium Bromide Monohydrate  (SPIRIVA  RESPIMAT) 1.25 MCG/ACT AERS  2. Multiple lung nodules on CT  R91.8 Tiotropium Bromide Monohydrate  (SPIRIVA  RESPIMAT) 1.25 MCG/ACT AERS     HPI Jasmine Neal 79 y.o. -presents for the above problems.  Visit is nearly after 1 year.  In the interim she is stable.  She is up-to-date with her vaccines.  July 2019 because of coronary artery calcifications and ongoing fixed exertional dyspnea she had a cardiac stress test I reviewed the result and this was normal.  She says the Brio is helping her but despite this she  continues to have fixed exertional dyspnea.  Her asthma control questionnaire is stable.  She had repeat lung function today it is stable and unchanged which is good but she still has severe fixed obstructive lung defect.  She has a family history of myasthenia gravis and her dad but she denies any end of the day fatigue or eyelid drooping symptoms.  Moreover the pulmonary function test is not restrictive.  There is no cough we discussed and she is interested in adding inhaler therapy.  She is not interested in second opinion for a fixed obstructive lung disease defect.   For multiple lung nodules: She has 1-1/2-year stability at this point.  CT Chest Lungs/Pleura: Multiple bilateral pulmonary nodules, as before. Index nodule in the apical segment right upper lobe measures 7 mm (5 x 8 mm), stable. Post radiation scarring in the anterior subpleural right lung. No pleural fluid. Airway is unremarkable.   Upper Abdomen: Visualized portions of the liver, gallbladder, adrenal glands, kidneys, spleen, pancreas, stomach and bowel are unremarkable. No upper abdominal adenopathy.   Musculoskeletal: Degenerative changes in the spine. No worrisome lytic or sclerotic lesions. Question mild radiation changes in the anterolateral right ribs, as before. No associated fracture.   IMPRESSION: Bilateral pulmonary nodules are stable and likely benign.     Electronically Signed   By: Newell Eke M.D.   On: 01/30/2019 13:17     OV 09/23/2019  Subjective:  Patient ID: Jasmine Neal, female , DOB: 1946-06-13 , age 51 y.o. , MRN: [id number] , ADDRESS: [address] [place] [address]   09/23/2019 -   Chief Complaint  Patient presents with   Follow-up    Pt states she has been doing okay since last visit and denies any recent flare ups with her asthma.     HPI Jasmine Neal 79 y.o. -   Follow-up moderate persistent asthma: She has fixed obstructive disease that does  not respond to inhaler therapy although symptoms improved.  Therefore back in September 2020 I added Spiriva  to the mix.  She says she only has still has mild dyspnea with fixed exertional relieved by rest.  It presents for walking an incline or stairs.  Otherwise she feels fine.  She does not think Spiriva  has helped.  She was supposed to have pulmonary function test but because of COVID-19 pandemic this has been disrupted.  We do not have the data.  She said that she met anesthetist who said that he was surprised that she did not have a rescue inhaler.  She has never needed 1.  However I did agree with the anesthesiologist recommendation for her to carry an albuterol  inhaler.  Therefore I settle make the prescription.  At this point in time she has had a Covid vaccine.  Pulmonary nodules: This is no stable for 2 years.  Results documented below.  I visualized and interpreted the CT scan myself.  There is report of emphysema on the CT scan but I could not appreciate it.  New issue: Fluid level in the esophagus: There is a new finding on the CT scan I personally visualized it.  She does have Dr. Eda is a gastroenterologist.  Last seen towards the end of 2020 but before the CT scan.  There is no mention of any esophageal issues on Dr. Eda medical record.  However this finding has postdated that visit.  I have asked the patient to take this up with Dr. Eda.       IMPRESSION: CT CHEST 1. Ongoing stability of bilateral pulmonary nodules, most consistent favoring a benign etiology. 2. Right axillary node dissection, without findings of thoracic metastatic disease. 3. Aortic atherosclerosis (ICD10-I70.0), coronary artery atherosclerosis and emphysema (ICD10-J43.9). 4. Esophageal air fluid level suggests dysmotility or gastroesophageal reflux.     Electronically Signed   By: Rockey Kilts M.D.   On: 06/25/2019 13:32 ROS - per HPI    OV 12/04/2019  Subjective:  Patient ID: Jasmine Neal, female , DOB: 10-Nov-1945 , age 22 y.o. , MRN: [id number] , ADDRESS: [address] [place] [address]   12/04/2019 -   Chief Complaint  Patient presents with  Follow-up    pt is here to go over spiro results.CT of chestlung nodule. pt states gerd. pt has endoscopy schedule for jun 3rrd   Followup fixed obstrucitve lung disease  HPI Jasmine Neal 79 y.o.  - returns for followup. Continue spiriva  and breo. No definite improvement in symptoms since spiriva . Overall stable/same. Has dyspnea for stairs and gardening but able to cope. Not a big issues. No flare ups. No prednisone  use. No ER visit. She is interested in tapering down her medication regimen but was interested in hearing other therapeutic options. We discussed biologic due to her increased eos as a way to improve symptoms ( would be  A time limited trial) but she is not interested.SABRA PFT show improvement in fev1 but reduction in dlco - overall on average is stable. No new issues.    In terms of her esophageal issues - she is having EGD in June 2021 by Dr Rojelio Brass      OV 04/16/2020   Subjective:  Patient ID: Jasmine Neal, female , DOB: 1946/02/20, age 18 y.o. years. , MRN: [id number],  ADDRESS: [address] [place] [address] PCP  Charlett, Apolinar POUR, MD Providers : Treatment Team:  Attending Provider: Geronimo Amel, MD Panosh, Apolinar POUR, MD as PCP - General  Patient Care Team: Charlett Apolinar POUR, MD as PCP - Diedre Mable Lenis, MD Jordan, Amy, MD as Consulting Physician (Dermatology) Maurilio Camellia PARAS, MD as Consulting Physician (Allergy and Immunology) Patrcia Sharper, MD as Consulting Physician (Ophthalmology) Octavia Charleston, MD as Consulting Physician (Ophthalmology) Maurilio Camellia PARAS, MD as Consulting Physician (Allergy and Immunology) Geronimo Amel, MD as Consulting Physician (Pulmonary Disease) Brass Iha, MD as Consulting Physician (Gastroenterology)   Chief  Complaint  Patient presents with   Follow-up    PFT performed today.  Pt states her breathing has been worse since last visit. Denies any complaints of wheezing, cough, or chest tightness.    Follow-up fixed obstructive lung disease   HPI Jasmine Neal 79 y.o. -last seen in May 2021.  After that she had endoscopy.  I could not visualize the results but I tried to located.  She did have it.  She tells me it was normal but she is on a short course of PPI.  At last visit we stopped her Spiriva  as an effort to reduce her overall medication burden.  She says after that she started having more shortness of breath with exertion relieved by rest.  It is for activities.  She says that otherwise she is at baseline.  Ideally she would like to do gardening but even before stopping the Spiriva  this was not possible because of shortness of breath.  She says she has resigned to the fact that she is 72 and therefore cannot do those.  This time because of the worsening shortness of breath she seems more open to having Biologics but at this same time as a first step she would like to go back on Spiriva .  She feels she really needs the Spiriva  along with the [facility].  Her lung function tests are shown below and it shows decline.  She has had the flu shot but plans to have the Covid booster soon.   IMPRESSION:  CT chest 1. Ongoing stability of bilateral pulmonary nodules, most consistent favoring a benign etiology. 2. Right axillary node dissection, without findings of thoracic metastatic disease. 3. Aortic atherosclerosis (ICD10-I70.0), coronary artery atherosclerosis and emphysema (ICD10-J43.9). 4. Esophageal air fluid level suggests dysmotility  or gastroesophageal reflux.     Electronically Signed   By: Rockey Kilts M.D.   On: 06/25/2019 13:32     OV 06/18/2020   Subjective:  Patient ID: Jasmine Neal, female , DOB: [birth year], age 48 y.o. years. , MRN: [id number],  ADDRESS: [address] [place] [address] PCP  Charlett, Apolinar POUR, MD Providers : Treatment Team:  Attending Provider: Geronimo Amel, MD Patient Care Team: Charlett Apolinar POUR, MD as PCP - Diedre Mable Lenis, MD Jordan, Amy, MD as Consulting Physician (Dermatology) Maurilio Camellia PARAS, MD as Consulting Physician (Allergy and Immunology) Patrcia Sharper, MD as Consulting Physician (Ophthalmology) Octavia Charleston, MD as Consulting Physician (Ophthalmology) Maurilio, Camellia PARAS, MD as Consulting Physician (Allergy and Immunology) Geronimo Amel, MD as Consulting Physician (Pulmonary Disease) Eda Iha, MD as Consulting Physician (Gastroenterology)    Chief Complaint  Patient presents with   Follow-up    Asthma, doing ok    Follow-up fixed obstructive lung disease   HPI Jasmine Neal 79 y.o. -at last visit I was concerned that FEV1 is declining.  She wanted to add Spiriva  and see how she is doing.  She comes back she says she is feeling better.  Her ACT score has improved from 20- to 22.  Not sure this is clinically significant.  Her FEV1 appears stable/slightly declined.  This is despite adding Spiriva .  However she says she feels better.  Her CBC with differential shows use no chills that are acceptable at 200 cells per cubic millimeter.  Her blood IgE is normal.    She had a CT scan of the chest -her lung nodules are stable.  The radiologist is reporting mild bronchiectasis in the upper lobe is stable.  However this the first time we are hearing this to mention in the report.  She denies any sputum production.   CLINICAL DATA:  Follow-up pulmonary nodules. History of breast cancer. Nonproductive cough. Asthma.   EXAM: CT CHEST WITHOUT CONTRAST   TECHNIQUE: Multidetector CT imaging of the chest was performed following the standard protocol without intravenous contrast. High resolution imaging of the lungs, as well as inspiratory and expiratory imaging, was performed.   COMPARISON:   06/25/2019 chest CT.   FINDINGS: Cardiovascular: Normal heart size. No significant pericardial effusion/thickening. Left anterior descending coronary atherosclerosis. Great vessels are normal in course and caliber.   Mediastinum/Nodes: No discrete thyroid  nodules. Unremarkable esophagus. Surgical clips throughout the right axilla, unchanged. No pathologically enlarged axillary, mediastinal or discrete hilar nodes on these noncontrast images.   Lungs/Pleura: No pneumothorax. No pleural effusion. Numerous (greater than 20) solid pulmonary nodules scattered throughout both lungs, largest 7 mm in the anterior right lower lobe (series 3/image 106), all stable since at least 10/03/2017 CT. Sub solid 0.8 cm right upper lobe nodule (series 3/image 29), stable. No acute consolidative airspace disease, lung masses or new significant pulmonary nodules. Mild sharply marginated patchy subpleural reticulation in the anterior mid to upper right lung, stable, compatible with mild radiation fibrosis. Scattered mild cylindrical bronchiectasis throughout both lungs involving all lung lobes, most prominent in right middle and right upper lobes. No significant regions of ground-glass opacity, architectural distortion or frank honeycombing. No appreciable interval change in the bronchiectasis. No significant lobular air trapping or evidence of tracheobronchomalacia on the expiration sequence.   Upper abdomen: No acute abnormality.   Musculoskeletal: No aggressive appearing focal osseous lesions. Moderate thoracic spondylosis.   IMPRESSION: 1. Stable scattered mild cylindrical bronchiectasis throughout both lungs, most prominent  in the right middle and right upper lobes. No evidence of interstitial lung disease. 2. 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 3. Numerous subcentimeter pulmonary nodules scattered throughout both lungs, all stable since at least 2019 CT,  presumably benign. No thoracic adenopathy or other findings to suggest metastatic disease. 4. One vessel coronary atherosclerosis.  addendum from Dr Marlyce - Mild tubular bronchiectasis throughout, seen since 10/03/2017, not really evident on much more remote CT dated 02/08/2005.   Electronically Signed   By: Selinda DELENA Blue M.D.   On: 06/11/2020 14:54      OV 10/12/2020  Subjective:  Patient ID: Jasmine Neal, female , DOB: February 11, 1946 , age 57 y.o. , MRN: [id number] , ADDRESS: [address] [place] [address] PCP Charlett, Apolinar POUR, MD Patient Care Team: Charlett Apolinar POUR, MD as PCP - Diedre Mable Lenis, MD Jordan, Amy, MD as Consulting Physician (Dermatology) Maurilio Camellia PARAS, MD as Consulting Physician (Allergy and Immunology) Patrcia Sharper, MD as Consulting Physician (Ophthalmology) Octavia Charleston, MD as Consulting Physician (Ophthalmology) Maurilio, Camellia PARAS, MD as Consulting Physician (Allergy and Immunology) Geronimo Amel, MD as Consulting Physician (Pulmonary Disease) Eda Iha, MD as Consulting Physician (Gastroenterology)  This Provider for this visit: Treatment Team:  Attending Provider: Geronimo Amel, MD    10/12/2020 -   Chief Complaint  Patient presents with   Follow-up    PFT performed today.  Pt states she is about the same since last visit. States she still becomes SOB with exertion.     HPI Abiola Behring 79 y.o. -returns for follow-up.  Overall she is stable.  She continues to have fixed exertional dyspnea on exertion such as bending over gardening climbing stairs.  This is not any worse.  No wheezing no orthopnea no proximal nocturnal dyspnea no chest tightness no hemoptysis.  No fever chills or no weight loss.  Her pulmonary function test was continued fixed obstruction.  Prebronchodilator it is worse than before.  Postbronchodilator it appears to be in baseline.  She feels baseline.  Last visit I emailed a second radiologist asking  for opinion on bronchiectasis but that email is lost from the secure server.  I emailed the radiologist again.  Reviewed the labs indicate that there is no alpha-1 antitrypsin phenotype on the record.  She continues with Breo and Spiriva .  She feels the Spiriva  is not helping her.  She is okay just doing Breo.  We discussed about second opinion at [facility] but she wants to hold off through the summer 2022 and then decide.      OV 04/19/2021  Subjective:  Patient ID: Jasmine Neal, female , DOB: 17-Mar-1946 , age 52 y.o. , MRN: [id number] , ADDRESS: [address] [place] [address] PCP Charlett, Apolinar POUR, MD Patient Care Team: Charlett Apolinar POUR, MD as PCP - Diedre Mable Lenis, MD Jordan, Amy, MD as Consulting Physician (Dermatology) Maurilio, Camellia PARAS, MD as Consulting Physician (Allergy and Immunology) Patrcia Sharper, MD as Consulting Physician (Ophthalmology) Octavia Charleston, MD as Consulting Physician (Ophthalmology) Maurilio, Camellia PARAS, MD as Consulting Physician (Allergy and Immunology) Geronimo Amel, MD as Consulting Physician (Pulmonary Disease) Eda Iha, MD as Consulting Physician (Gastroenterology)  This Provider for this visit: Treatment Team:  Attending Provider: Geronimo Amel, MD    04/19/2021 -   Chief Complaint  Patient presents with   Follow-up    PFT done     Follow-up with follow-up fixed obstructive lung defect with normal  DLCO  HPI Jasmine Hauter 79 y.o. -last seen in April 2022.  She tells me that she has had continuing to have progressive dyspnea on exertion.  When doing rock gardening she could do 15 minutes without a break last year.  Currently it is down to 6 or 7 minutes.  Symptom scores are worsening.  Her FEV1 appears stable although the FVC itself appears to have declined a little bit.  We still have not found an etiology for fixed obstructive lung defect.  Last visit I checked alpha-1 and this was normal.  Last visit  in April 2022 realized for the first time she had bronchiectasis reported on her radiology.  I had the radiologist go back and appears she has mild diffuse bronchiectasis since 2019 [not present on CT scan 2006].  She is not having any cough.  We discussed lung biopsy but she wants to hold off.  We discussed referral.  She is reluctantly agreed.  Discussed about visiting the bronchiectasis center at [facility]  She is agreed.  They might recommend a bronchoscopy.  She also is to review records if she has had a flu shot.  If she is not had a flu shot then she will have it today.  Lab Results  Component Value Date   NITRICOXIDE 9 04/19/2021   Asthma Control Test ACT Total Score  04/19/2021 18  10/12/2020 22  06/18/2020 22        OV 07/22/2024  Subjective:  Patient ID: Jasmine Neal, female , DOB: 03/23/1946 , age 46 y.o. , MRN: [id number] , ADDRESS: [address] [place] [address] PCP Charlett, Apolinar POUR, MD Patient Care Team: Charlett Apolinar POUR, MD as PCP - General Mable Lenis, MD (Inactive) Jordan, Amy, MD as Consulting Physician (Dermatology) Patrcia Sharper, MD as Consulting Physician (Ophthalmology) Octavia Charleston, MD as Consulting Physician (Ophthalmology) Geronimo Amel, MD as Consulting Physician (Pulmonary Disease) Eda Iha, MD (Inactive) as Consulting Physician (Gastroenterology) Patel, Donika K, DO as Consulting Physician (Neurology)  This Provider for this visit: Treatment Team:  Attending Provider: Geronimo Amel, MD    07/22/2024 -   Chief Complaint  Patient presents with   Consult    Asthma-- needs refill on Breo. She rarely uses the albuterol  inhaler. Breathing is about the same since the last visit here 2022. She has some SOB when she walks up stairs.    Follow-up with follow-up fixed obstructive lung defect with normal DLCO (asthma with bronchiectasis)  HPI Jasmine Neal 79 y.o. -an Ahtziry Saathoff is a 79 year old female  with severe chronic airway obstruction (likely severe persistent asthma) and bronchiectasis who presents for medication refill BREO. and evaluation of current treatment options. She has a history of severe chronic airway obstruction and bronchiectasis, previously managed with Breo.  She saysHer eosinophil count was thirteen, below the threshold for certain inhalers. In 2022, I referred her to [facility] . She saw Dr. Ronal Kipper in the bronchiectasis center today.  She last saw her in May 2023.  At that time pulmonary function test on external record review showed FEV1 0.99 L with severe obstruction [similar to here].  She had cardiopulmonary stress test that showed respiratory limitation with hypercapnia.  Since May 2023 she has not followed up there but she has been getting her Breo refills.  She is not taking Spiriva .  Currently she is going to run out of Breo refills and she just wanted primary care to fill that but primary care physician felt  that she would benefit from this pulmonary visit to get updates on therapies and options with her condition.  Since I last saw her few years ago: Her symptoms have slightly worsened over the past two and a half years, particularly during physical exertion such as yard work or climbing stairs. She experiences shortness of breath during these activities but has not required hospitalization, urgent care, or emergency room visits. Over the recent Christmas period, she experienced a viral illness that was not flu, COVID, or pneumonia, and was treated with over-the-counter Mucinex DM. She has since returned to her baseline health.   Despite worsening symptoms she feels that she is 79 almost and then she does not want to do too many therapies.  She definitely does not want to do repeat pulmonary function testing.  Yet she was curious about options  Her blood eosinophils have been 200 cells per cubic millimeter or below at in our records.  Her previous IgE was normal.  We  discussed Biologics of Th2 inflammation versus broad-spectrum biologicTEZPIRE.  I showed her the data from Journal of medicine 2021.  I felt she could benefit from this.  Described the biologicals every 4 weeks.  Described the class of monoclonal antibodies is overall safe.  She wants to do her own research and reflect on this.  If she is ready for it she will call us  and also schedule an appointment with Niels and her pharmacy team.  Otherwise she just wants to come back in 1 year.  But also she might just want to do her refill of her Breo with the primary care physician which I was supported.  I did encourage her to strongly consider this biologic because it does improve her lung function improve her symptoms and reduce his flareups.  The only other medication is new BRINSUPRI for recurrent exacerbations of bronchiectasis but she does not have this and this medication is not indicated.    SYMPTOM SCALE - geeral 07/22/2024  Current weight   O2 use ra  Shortness of Breath 0 -> 5 scale with 5 being worst (score 6 If unable to do)  At rest 0  Simple tasks - showers, clothes change, eating, shaving 1  Household (dishes, doing bed, laundry) 2  Shopping 1  Walking level at own pace 2  Walking up Stairs 3  Total (30-36) Dyspnea Score 9  How bad is your cough? 1  How bad is your fatigue 4  How bad is appetiee 0  How bad is nausea 0  How bad is vomiting?  0  How bad is diarrhea? 0  How bad is anxiety? 0  How bad is depression 00  Any chronic pain - if so where and how bad 0      Asthma Control Panel eos 300 always - last 2018, 100 in march 2019 and 400 in aug 2017 and 200 in oct 2021   IgE 8 - march 2019 and 13 in march 2021 12/06/2016 08/18/2017   09/22/2017   02/16/2018   03/26/2019   09/23/2019   04/16/2020   06/18/2020   04/19/2021    Current Med Regimen   anoro breo breo breo breo and spiriva         ACT 22           20 22 18   ACQ 5 point- 1 week. wtd avg score. <1.0 is good control  0.75-1.25 is grey zone. >1.25 poor control. Delta 0.5 is clinically meaningful   12/5 =  2.4 0.5 0.4 0.4 0.4        FeNO ppB   58   25       16 9   FeV1    0.96L/38% in May 2018 outside 1.11/425% -> 0.79/30% ratop 65 -> 39, DLCO 18.33/61%   1.14L/43% and ratio 52   Post BD 1.01L/55%, RAt 52, DLCO 82% Post GB 0.98L/38%, Ratio55, DLCO 82%    Planned intervention  for visit   Change anoro to breo low dose breo + get HRCT + blood allergy panel -. Did only IgE - normal 8                      Results for Jasmine Neal, Jasmine Neal (MRN [id number]) as of 04/19/2021 10:37   Ref. Range 09/22/2017 12:49 05/14/2019 10:05 04/16/2020 16:00 10/12/2020 14:05  IgE (Immunoglobulin E), Serum Latest Ref Range: <OR=114 kU/L 8   13    A-1 Antitrypsin, Ser Latest Ref Range: 83 - 199 mg/dL       885  Anti Nuclear Antibody (ANA) Latest Ref Range: NEGATIVE    NEGATIVE      Immunoglobulin A Latest Ref Range: 70 - 320 mg/dL   736      Mitochondrial M2 Ab, IgG Latest Units: U   < OR = 20.0      IgG (Immunoglobin G), Serum Latest Ref Range: 600 - 1,540 mg/dL   118          PFT     Latest Ref Rng & Units 04/19/2021    8:54 AM 10/12/2020   12:44 PM 06/18/2020   11:56 AM 04/16/2020    2:03 PM 11/29/2019    1:37 PM 03/26/2019    8:48 AM 09/22/2017    9:55 AM  PFT Results  FVC-Pre L 1.88  1.93  2.05  2.02  2.29  2.17  2.24   FVC-Predicted Pre % 55  55  59  58  66  62  65   FVC-Post L 1.84  2.05  1.76  1.92    2.05   FVC-Predicted Post % 54  59  51  55    59   Pre FEV1/FVC % % 51  49  56  54  55  52  49   Post FEV1/FCV % % 55  60  55  52    39   FEV1-Pre L 0.96  0.94  1.16  1.10  1.26  1.14  1.11   FEV1-Predicted Pre % 37  35  44  42  48  43  42   FEV1-Post L 1.01  1.23  0.98  1.01    0.79   DLCO uncorrected ml/min/mmHg 18.39   18.60  18.94  16.30  17.13  18.33   DLCO UNC% % 81   82  84  72  75  61   DLCO corrected ml/min/mmHg 18.39   18.60  18.83  16.30  16.78    DLCO COR %Predicted % 81   82  83  72  74    DLVA Predicted %  136   134  137  110  132  96   TLC L       6.80   TLC % Predicted %       119   RV % Predicted %       181        LAB RESULTS last 96 hours No results found.  has a past medical history of Allergy, Anxiety, Asthma, Breast cancer (HCC), Cataract, Chronic airway obstruction, not elsewhere classified, Coronary artery calcification seen on CAT scan (01/23/2018), Diverticulosis, Dysplasia, Family history of breast cancer, Family history of pancreatic cancer, Fatty liver, Fibromyalgia, GERD (gastroesophageal reflux disease), nonmelanoma skin cancer, Hyperlipidemia, Hypothyroidism, Malignant neoplasm of breast (female), unspecified site (11/28/2013), Osteoarthritis, Osteopenia, Osteoporosis, REM sleep behavior disorder, and Skin cancer.   reports that she quit smoking about 50 years ago. Her smoking use included cigarettes. She started smoking about 62 years ago. She has a 12 pack-year smoking history. She has never used smokeless tobacco.  Past Surgical History:  Procedure Laterality Date   BREAST LUMPECTOMY     on right, 1990 IIA radiation cmfp ajuction rx   COLONOSCOPY     KNEE SURGERY  2012   NASAL SINUS SURGERY  2007   on right done by Dr. Mable    Allergies[1]  Immunization History  Administered Date(s) Administered    sv, Bivalent, Protein Subunit Rsvpref,pf [place]) 03/21/2022   Fluad Quad(high Dose 65+) 03/12/2019, 03/30/2020, 04/19/2021, 05/24/2022   Hepatitis A 08/30/2017   Hepatitis A, Adult 09/07/2017, 03/21/2018   INFLUENZA, HIGH DOSE SEASONAL PF 04/22/2016, 04/24/2023, 04/17/2024   Influenza Whole 04/10/2002, 06/01/2009, 05/05/2010, 05/22/2012, 02/20/2017   Influenza,inj,Quad PF,6+ Mos 06/04/2013   Influenza-Unspecified 04/10/2002, 06/01/2009, 05/05/2010, 05/23/2011, 05/22/2012, 06/04/2013, 02/20/2017   PFIZER Comirnaty(Gray Top)Covid-19 Tri-Sucrose Vaccine 10/27/2020   PFIZER(Purple Top)SARS-COV-2 Vaccination 08/03/2019, 08/24/2019, 04/22/2020   Pfizer  Covid-19 Vaccine Bivalent Booster 13yrs & up 03/23/2021   Pfizer(Comirnaty)Fall Seasonal Vaccine 12 years and older 03/28/2022, 04/20/2023   Pneumococcal Conjugate-13 10/09/2013   Pneumococcal Polysaccharide-23 05/05/2010   Pneumococcal-Unspecified 05/05/2010, 10/09/2013   Td 08/11/2006, 02/27/2017   Typhoid Inactivated 09/07/2017   Typhoid Live 09/07/2017   Unspecified SARS-COV-2 Vaccination 04/17/2024   Varicella 09/11/2012   Zoster Recombinant(Shingrix) 06/06/2018, 03/14/2019   Zoster, Live 09/11/2012, 04/02/2018    Family History  Problem Relation Age of Onset   Breast cancer Mother 47       possible uterine cancer diagnosis at age 92   Osteoporosis Mother        no hip fracture   Other Father        mysthenia gravis   Lung cancer Sister    Breast cancer Sister 7   Lung cancer Sister 20       former smoker   Lung cancer Paternal Grandmother    Osteoporosis Other    Thyroid  disease Other    Breast cancer Other        MGMs sister   Colon cancer Neg Hx    Esophageal cancer Neg Hx    Stomach cancer Neg Hx    Rectal cancer Neg Hx     Current Medications[2]      Objective:   Vitals:   07/22/24 0959  BP: 124/70  Pulse: 78  SpO2: 97%  Weight: 163 lb 12.8 oz (74.3 kg)  Height: 5' 8 (1.727 m)    Estimated body mass index is 24.91 kg/m as calculated from the following:   Height as of this encounter: 5' 8 (1.727 m).   Weight as of this encounter: 163 lb 12.8 oz (74.3 kg).  @WEIGHTCHANGE @  [employer]   07/22/24 0959  Weight: 163 lb 12.8 oz (74.3 kg)     Physical Exam   General: No distress. Looks well O2 at rest: nop Cane present: no Sitting in wheel chair: no Frail: no Obese: no Neuro: Alert and Oriented x 3. GCS  15. Speech normal Psych: Pleasant Resp:  Barrel Chest - no.  Wheeze - no, Crackles - no, No overt respiratory distress CVS: Normal heart sounds. Murmurs - no Ext: Stigmata of Connective Tissue Disease - no HEENT: Normal upper  airway. PEERL +. No post nasal drip        Assessment/     Assessment & Plan Bronchiectasis without complication (HCC)  Severe persistent asthma without complication (HCC)    PLAN Patient Instructions  Severe persistent asthma Mild difffuse tubular bronchiectasis 2019> dec 2021 (most prominent Right Upper lobe and right middle lobe)  - breathing test  with  severe  fixed lung obstruction  and no reason other than bronchiectasisis +/- asthma found - Same conclusion at [facility] with Dr Blondie at last visit in May 2023  - CPST showed retaned co2 with respiratory limitation  - subjectively slowly getting worse as of 07/22/2024   Plan  --Continue  albuterol  as needed - you do need rescue inhaler - continue breo as before  - try BREZTRI  2 puff twice daily and ssee if this help  - Dr Charlett can prescribe this medicine - STrongly consider BIOLOGIC for asthma/obstructive lung disease  - we discussed TEZPIRE and recommend this   - if you are interested let us  know and we can set an appoitment with Aleck Puls our Pharm D  - you can always decide to do a time limited trial for 3  months   Multiple lung nodules on CT - Last 2021  - largest in Right upper lobe -stable march -> july 2019 ->July 2020 -> dec 2020 -. Dec 2021 -  Plan -no further folowup   At risk for respiratory infection  Plan  - Avoid respiratory illness sick exposure and control your risk for respiratory infection  - be uptodate with all respiratory vaccines  - avoid sick contacts especially in areas of indoor clusters (churches, weddings, funerals, family gatherings, birthdays, planes, malls, indoor areas especially)   - mask in these areas   - discourage sick people from coming in close contact with you   Followup 12 months or sooner with Dr Geronimo or just with PCP per your desire    FOLLOWUP    Return for 12 months or sooner with Dr Geronimo or just with PCP per your  desire.    SIGNATURE    Dr. Dorethia Geronimo, M.D., F.C.C.P,  Pulmonary and Critical Care Medicine Staff Physician, [facility] Director - Interstitial Lung Disease  Program  Pulmonary Fibrosis [facility] at [facility] [place], [place], [address]  Pager: [phone], If no answer or between  15:00h - 7:00h: call [phone] Telephone: [phone]  10:39 AM 07/22/2024     [1]  Allergies Allergen Reactions   Dust Mite Extract Other (See Comments)   Hydrocodone Itching  [2]  Current Outpatient Medications:    acetaminophen (TYLENOL) 500 MG tablet, Take 1,000 mg by mouth daily as needed. pain, Disp: , Rfl:    albuterol  (VENTOLIN  HFA) 108 (90 Base) MCG/ACT inhaler, Inhale 2 puffs into the lungs every 4 (four) hours as needed for wheezing or shortness of breath., Disp: 18 g, Rfl: 0   amLODipine  (NORVASC ) 5 MG tablet, Take 1 tablet (5 mg total) by mouth daily., Disp: 90 tablet, Rfl: 3   atorvastatin  (LIPITOR) 20 MG tablet, TAKE 1 TABLET EVERY DAY, Disp: 90 tablet, Rfl: 3   budesonide -glycopyrrolate-formoterol  (BREZTRI  AEROSPHERE) 160-9-4.8  MCG/ACT AERO inhaler, Inhale 2 puffs into the lungs in the morning and at bedtime., Disp: , Rfl:    Cholecalciferol (VITAMIN D3) 50 MCG (2000 UT) TABS, Take by mouth daily., Disp: , Rfl:    diphenhydramine-acetaminophen (TYLENOL PM) 25-500 MG TABS, Take 1 tablet by mouth at bedtime as needed. sleep, Disp: , Rfl:    fluticasone  furoate-vilanterol (BREO ELLIPTA ) 200-25 MCG/ACT AEPB, Inhale 1 puff into the lungs daily., Disp: , Rfl:    GEMTESA 75 MG TABS, Take 1 tablet by mouth daily., Disp: , Rfl:    meloxicam (MOBIC) 15 MG tablet, Take one tablet daily with food, Disp: , Rfl:    PARoxetine  (PAXIL ) 40 MG tablet, Take 1 tablet (40 mg total) by mouth at bedtime., Disp: 90 tablet, Rfl: 3   SYNTHROID  88 MCG tablet, Take 1 tablet (88 mcg total) by mouth daily., Disp: 90 tablet, Rfl: 3   valACYclovir  (VALTREX ) 1000  MG tablet, TAKE 2 TABLETS BY MOUTH TWICE DAILY AS DIRECTED, Disp: 90 tablet, Rfl: prn   vitamin B-12 (CYANOCOBALAMIN ) 1000 MCG tablet, Take 1,000 mcg by mouth daily., Disp: , Rfl:    vitamin C (ASCORBIC ACID) 500 MG tablet, Take 500 mg by mouth daily., Disp: , Rfl:   "

## 2024-07-22 NOTE — Patient Instructions (Addendum)
 Severe persistent asthma Mild difffuse tubular bronchiectasis 2019> dec 2021 (most prominent Right Upper lobe and right middle lobe)  - breathing test  with  severe  fixed lung obstruction  and no reason other than bronchiectasisis +/- asthma found - Same conclusion at [facility] with Dr Blondie at last visit in May 2023  - CPST showed retaned co2 with respiratory limitation  - subjectively slowly getting worse as of 07/22/2024   Plan  --Continue  albuterol  as needed - you do need rescue inhaler - continue breo as before  - try BREZTRI  2 puff twice daily and ssee if this help  - Dr Charlett can prescribe this medicine - STrongly consider BIOLOGIC for asthma/obstructive lung disease  - we discussed TEZPIRE and recommend this   - if you are interested let us  know and we can set an appoitment with Aleck Puls our Pharm D  - you can always decide to do a time limited trial for 3  months   Multiple lung nodules on CT - Last 2021  - largest in Right upper lobe -stable march -> july 2019 ->July 2020 -> dec 2020 -. Dec 2021 -  Plan -no further folowup   At risk for respiratory infection  Plan  - Avoid respiratory illness sick exposure and control your risk for respiratory infection  - be uptodate with all respiratory vaccines  - avoid sick contacts especially in areas of indoor clusters (churches, weddings, funerals, family gatherings, birthdays, planes, malls, indoor areas especially)   - mask in these areas   - discourage sick people from coming in close contact with you   Followup 12 months or sooner with Dr Geronimo or just with PCP per your desire
